# Patient Record
Sex: Female | Born: 1955 | Race: White | Hispanic: No | Marital: Married | Smoking: Never smoker
Health system: Southern US, Community
[De-identification: ages and names within clinical notes are randomized; demographics above are authoritative.]

## PROBLEM LIST (undated history)

## (undated) DIAGNOSIS — Z9889 Other specified postprocedural states: Secondary | ICD-10-CM

## (undated) DIAGNOSIS — N200 Calculus of kidney: Secondary | ICD-10-CM

## (undated) DIAGNOSIS — R112 Nausea with vomiting, unspecified: Secondary | ICD-10-CM

## (undated) DIAGNOSIS — I8390 Asymptomatic varicose veins of unspecified lower extremity: Secondary | ICD-10-CM

## (undated) DIAGNOSIS — R896 Abnormal cytological findings in specimens from other organs, systems and tissues: Secondary | ICD-10-CM

## (undated) DIAGNOSIS — N362 Urethral caruncle: Secondary | ICD-10-CM

## (undated) DIAGNOSIS — I872 Venous insufficiency (chronic) (peripheral): Secondary | ICD-10-CM

## (undated) DIAGNOSIS — R0982 Postnasal drip: Secondary | ICD-10-CM

## (undated) DIAGNOSIS — F418 Other specified anxiety disorders: Secondary | ICD-10-CM

## (undated) DIAGNOSIS — F988 Other specified behavioral and emotional disorders with onset usually occurring in childhood and adolescence: Secondary | ICD-10-CM

## (undated) DIAGNOSIS — M1990 Unspecified osteoarthritis, unspecified site: Secondary | ICD-10-CM

## (undated) DIAGNOSIS — Z8739 Personal history of other diseases of the musculoskeletal system and connective tissue: Secondary | ICD-10-CM

## (undated) DIAGNOSIS — K219 Gastro-esophageal reflux disease without esophagitis: Secondary | ICD-10-CM

## (undated) DIAGNOSIS — Z87442 Personal history of urinary calculi: Secondary | ICD-10-CM

## (undated) HISTORY — PX: NASAL SINUS SURGERY: SHX719

## (undated) HISTORY — DX: Calculus of kidney: N20.0

## (undated) HISTORY — DX: Venous insufficiency (chronic) (peripheral): I87.2

## (undated) HISTORY — DX: Asymptomatic varicose veins of unspecified lower extremity: I83.90

## (undated) HISTORY — DX: Abnormal cytological findings in specimens from other organs, systems and tissues: R89.6

## (undated) HISTORY — DX: Personal history of other diseases of the musculoskeletal system and connective tissue: Z87.39

## (undated) HISTORY — PX: TOTAL HIP ARTHROPLASTY: SHX124

## (undated) HISTORY — DX: Urethral caruncle: N36.2

## (undated) HISTORY — PX: TONSILLECTOMY: SUR1361

## (undated) HISTORY — DX: Other specified anxiety disorders: F41.8

---

## 1996-09-19 DIAGNOSIS — IMO0001 Reserved for inherently not codable concepts without codable children: Secondary | ICD-10-CM

## 1996-09-19 DIAGNOSIS — N362 Urethral caruncle: Secondary | ICD-10-CM

## 1996-09-19 HISTORY — DX: Urethral caruncle: N36.2

## 1996-09-19 HISTORY — DX: Reserved for inherently not codable concepts without codable children: IMO0001

## 1998-05-06 ENCOUNTER — Encounter: Admission: RE | Admit: 1998-05-06 | Discharge: 1998-08-04 | Payer: Self-pay | Admitting: Specialist

## 1998-07-27 ENCOUNTER — Encounter: Payer: Self-pay | Admitting: Orthopedic Surgery

## 1998-08-03 ENCOUNTER — Encounter: Payer: Self-pay | Admitting: Orthopedic Surgery

## 1998-08-03 ENCOUNTER — Inpatient Hospital Stay (HOSPITAL_COMMUNITY): Admission: RE | Admit: 1998-08-03 | Discharge: 1998-08-07 | Payer: Self-pay | Admitting: Orthopedic Surgery

## 1998-08-10 ENCOUNTER — Encounter (HOSPITAL_COMMUNITY): Admission: RE | Admit: 1998-08-10 | Discharge: 1998-11-08 | Payer: Self-pay | Admitting: Orthopedic Surgery

## 1999-10-07 ENCOUNTER — Other Ambulatory Visit: Admission: RE | Admit: 1999-10-07 | Discharge: 1999-10-07 | Payer: Self-pay | Admitting: Obstetrics & Gynecology

## 2000-03-20 ENCOUNTER — Encounter: Admission: RE | Admit: 2000-03-20 | Discharge: 2000-03-20 | Payer: Self-pay | Admitting: Internal Medicine

## 2000-03-20 ENCOUNTER — Encounter: Payer: Self-pay | Admitting: Internal Medicine

## 2000-11-13 ENCOUNTER — Other Ambulatory Visit: Admission: RE | Admit: 2000-11-13 | Discharge: 2000-11-13 | Payer: Self-pay | Admitting: Obstetrics & Gynecology

## 2001-09-13 ENCOUNTER — Encounter: Admission: RE | Admit: 2001-09-13 | Discharge: 2001-09-13 | Payer: Self-pay | Admitting: Urology

## 2001-09-13 ENCOUNTER — Encounter: Payer: Self-pay | Admitting: Urology

## 2001-11-29 ENCOUNTER — Other Ambulatory Visit: Admission: RE | Admit: 2001-11-29 | Discharge: 2001-11-29 | Payer: Self-pay | Admitting: Obstetrics & Gynecology

## 2002-12-11 ENCOUNTER — Other Ambulatory Visit: Admission: RE | Admit: 2002-12-11 | Discharge: 2002-12-11 | Payer: Self-pay | Admitting: Obstetrics & Gynecology

## 2003-08-20 HISTORY — PX: COLONOSCOPY: SHX5424

## 2003-12-16 ENCOUNTER — Other Ambulatory Visit: Admission: RE | Admit: 2003-12-16 | Discharge: 2003-12-16 | Payer: Self-pay | Admitting: Obstetrics & Gynecology

## 2004-12-13 ENCOUNTER — Other Ambulatory Visit: Admission: RE | Admit: 2004-12-13 | Discharge: 2004-12-13 | Payer: Self-pay | Admitting: Obstetrics and Gynecology

## 2004-12-27 ENCOUNTER — Encounter: Admission: RE | Admit: 2004-12-27 | Discharge: 2004-12-27 | Payer: Self-pay | Admitting: *Deleted

## 2005-11-21 ENCOUNTER — Other Ambulatory Visit: Admission: RE | Admit: 2005-11-21 | Discharge: 2005-11-21 | Payer: Self-pay | Admitting: Obstetrics and Gynecology

## 2006-12-19 ENCOUNTER — Other Ambulatory Visit: Admission: RE | Admit: 2006-12-19 | Discharge: 2006-12-19 | Payer: Self-pay | Admitting: Obstetrics and Gynecology

## 2006-12-19 ENCOUNTER — Encounter: Payer: Self-pay | Admitting: Family Medicine

## 2006-12-19 LAB — CONVERTED CEMR LAB
Cholesterol: 247 mg/dL
HDL: 46 mg/dL
Hgb A1c MFr Bld: 4.9 %
LDL Cholesterol: 174 mg/dL
Triglycerides: 133 mg/dL

## 2006-12-21 ENCOUNTER — Encounter: Admission: RE | Admit: 2006-12-21 | Discharge: 2006-12-21 | Payer: Self-pay | Admitting: Obstetrics & Gynecology

## 2006-12-21 LAB — HM MAMMOGRAPHY

## 2007-05-09 ENCOUNTER — Ambulatory Visit: Payer: Self-pay | Admitting: Family Medicine

## 2007-05-09 LAB — CONVERTED CEMR LAB
Bilirubin Urine: NEGATIVE
Blood Glucose, Fasting: 81 mg/dL
Glucose, Urine, Semiquant: NEGATIVE
Ketones, urine, test strip: NEGATIVE
Nitrite: NEGATIVE
Protein, U semiquant: NEGATIVE
Specific Gravity, Urine: 1.015
Urobilinogen, UA: 0.2
WBC Urine, dipstick: NEGATIVE
pH: 8.5

## 2007-05-10 LAB — CONVERTED CEMR LAB
ALT: 15 units/L (ref 0–35)
AST: 21 units/L (ref 0–37)
Albumin: 4.1 g/dL (ref 3.5–5.2)
Alkaline Phosphatase: 52 units/L (ref 39–117)
BUN: 15 mg/dL (ref 6–23)
CO2: 25 meq/L (ref 19–32)
Calcium: 8.9 mg/dL (ref 8.4–10.5)
Chloride: 104 meq/L (ref 96–112)
Creatinine, Ser: 0.83 mg/dL (ref 0.40–1.20)
Glucose, Bld: 82 mg/dL (ref 70–99)
Potassium: 4.3 meq/L (ref 3.5–5.3)
Sodium: 136 meq/L (ref 135–145)
Total Bilirubin: 0.7 mg/dL (ref 0.3–1.2)
Total Protein: 6.8 g/dL (ref 6.0–8.3)

## 2007-05-12 ENCOUNTER — Encounter: Payer: Self-pay | Admitting: Family Medicine

## 2007-05-23 ENCOUNTER — Ambulatory Visit: Payer: Self-pay | Admitting: Family Medicine

## 2007-05-23 LAB — CONVERTED CEMR LAB
Bilirubin Urine: NEGATIVE
Glucose, Urine, Semiquant: NEGATIVE
Ketones, urine, test strip: NEGATIVE
Nitrite: NEGATIVE
Protein, U semiquant: NEGATIVE
Specific Gravity, Urine: 1.02
Urobilinogen, UA: NEGATIVE
WBC Urine, dipstick: NEGATIVE
pH: 6.5

## 2007-06-01 ENCOUNTER — Encounter: Payer: Self-pay | Admitting: Family Medicine

## 2007-06-01 ENCOUNTER — Telehealth (INDEPENDENT_AMBULATORY_CARE_PROVIDER_SITE_OTHER): Payer: Self-pay | Admitting: *Deleted

## 2007-06-01 LAB — CONVERTED CEMR LAB
Creatinine, Urine: 156.4 mg/dL
RBC / HPF: NONE SEEN (ref <3)
Total Protein, Urine: 8
WBC, UA: NONE SEEN cells/hpf (ref <3)

## 2007-07-12 ENCOUNTER — Encounter: Payer: Self-pay | Admitting: Family Medicine

## 2007-08-07 ENCOUNTER — Encounter: Payer: Self-pay | Admitting: Family Medicine

## 2007-08-29 ENCOUNTER — Ambulatory Visit: Payer: Self-pay | Admitting: Family Medicine

## 2007-08-29 DIAGNOSIS — I872 Venous insufficiency (chronic) (peripheral): Secondary | ICD-10-CM | POA: Insufficient documentation

## 2007-09-11 ENCOUNTER — Encounter: Payer: Self-pay | Admitting: Family Medicine

## 2007-09-11 ENCOUNTER — Ambulatory Visit: Payer: Self-pay | Admitting: Family Medicine

## 2007-09-11 DIAGNOSIS — F988 Other specified behavioral and emotional disorders with onset usually occurring in childhood and adolescence: Secondary | ICD-10-CM | POA: Insufficient documentation

## 2007-09-17 ENCOUNTER — Telehealth (INDEPENDENT_AMBULATORY_CARE_PROVIDER_SITE_OTHER): Payer: Self-pay | Admitting: *Deleted

## 2007-09-17 ENCOUNTER — Encounter: Payer: Self-pay | Admitting: Family Medicine

## 2007-09-17 DIAGNOSIS — N841 Polyp of cervix uteri: Secondary | ICD-10-CM | POA: Insufficient documentation

## 2007-09-17 DIAGNOSIS — E785 Hyperlipidemia, unspecified: Secondary | ICD-10-CM | POA: Insufficient documentation

## 2007-10-12 ENCOUNTER — Ambulatory Visit: Payer: Self-pay | Admitting: Family Medicine

## 2007-11-12 ENCOUNTER — Telehealth: Payer: Self-pay | Admitting: Family Medicine

## 2007-12-13 ENCOUNTER — Ambulatory Visit: Payer: Self-pay | Admitting: Family Medicine

## 2007-12-13 DIAGNOSIS — N959 Unspecified menopausal and perimenopausal disorder: Secondary | ICD-10-CM | POA: Insufficient documentation

## 2007-12-20 ENCOUNTER — Other Ambulatory Visit: Admission: RE | Admit: 2007-12-20 | Discharge: 2007-12-20 | Payer: Self-pay | Admitting: Obstetrics and Gynecology

## 2008-01-01 ENCOUNTER — Ambulatory Visit: Payer: Self-pay | Admitting: Family Medicine

## 2008-01-01 ENCOUNTER — Encounter: Admission: RE | Admit: 2008-01-01 | Discharge: 2008-01-01 | Payer: Self-pay | Admitting: Obstetrics and Gynecology

## 2008-01-02 ENCOUNTER — Encounter: Payer: Self-pay | Admitting: Family Medicine

## 2008-01-14 ENCOUNTER — Telehealth: Payer: Self-pay | Admitting: Family Medicine

## 2008-01-24 ENCOUNTER — Telehealth: Payer: Self-pay | Admitting: Family Medicine

## 2008-02-15 ENCOUNTER — Telehealth: Payer: Self-pay | Admitting: Family Medicine

## 2008-03-18 ENCOUNTER — Ambulatory Visit: Payer: Self-pay | Admitting: Family Medicine

## 2008-04-17 ENCOUNTER — Telehealth: Payer: Self-pay | Admitting: Family Medicine

## 2008-05-21 ENCOUNTER — Telehealth: Payer: Self-pay | Admitting: Family Medicine

## 2008-06-10 ENCOUNTER — Ambulatory Visit: Payer: Self-pay | Admitting: Occupational Medicine

## 2008-06-23 ENCOUNTER — Telehealth: Payer: Self-pay | Admitting: Family Medicine

## 2008-06-25 ENCOUNTER — Telehealth: Payer: Self-pay | Admitting: Family Medicine

## 2008-06-26 ENCOUNTER — Ambulatory Visit: Payer: Self-pay | Admitting: Occupational Medicine

## 2008-08-28 ENCOUNTER — Ambulatory Visit: Payer: Self-pay | Admitting: Family Medicine

## 2008-09-29 ENCOUNTER — Telehealth: Payer: Self-pay | Admitting: Family Medicine

## 2008-11-03 ENCOUNTER — Telehealth: Payer: Self-pay | Admitting: Family Medicine

## 2008-11-21 ENCOUNTER — Telehealth: Payer: Self-pay | Admitting: Family Medicine

## 2008-12-18 ENCOUNTER — Encounter: Payer: Self-pay | Admitting: Family Medicine

## 2008-12-18 HISTORY — PX: COLONOSCOPY W/ BIOPSIES: SHX1374

## 2008-12-18 LAB — HM COLONOSCOPY: HM Colonoscopy: ABNORMAL

## 2008-12-23 ENCOUNTER — Other Ambulatory Visit: Admission: RE | Admit: 2008-12-23 | Discharge: 2008-12-23 | Payer: Self-pay | Admitting: Obstetrics and Gynecology

## 2009-01-02 ENCOUNTER — Telehealth: Payer: Self-pay | Admitting: Family Medicine

## 2009-01-05 ENCOUNTER — Encounter: Admission: RE | Admit: 2009-01-05 | Discharge: 2009-01-05 | Payer: Self-pay | Admitting: Obstetrics and Gynecology

## 2009-01-07 ENCOUNTER — Encounter: Payer: Self-pay | Admitting: Family Medicine

## 2009-01-24 ENCOUNTER — Ambulatory Visit: Payer: Self-pay | Admitting: Family Medicine

## 2009-01-31 ENCOUNTER — Ambulatory Visit: Payer: Self-pay | Admitting: Family Medicine

## 2009-02-09 ENCOUNTER — Telehealth: Payer: Self-pay | Admitting: Family Medicine

## 2009-03-12 ENCOUNTER — Telehealth: Payer: Self-pay | Admitting: Family Medicine

## 2009-04-10 ENCOUNTER — Telehealth: Payer: Self-pay | Admitting: Family Medicine

## 2009-05-13 ENCOUNTER — Telehealth: Payer: Self-pay | Admitting: Family Medicine

## 2009-06-11 ENCOUNTER — Telehealth: Payer: Self-pay | Admitting: Family Medicine

## 2009-07-01 ENCOUNTER — Ambulatory Visit: Payer: Self-pay | Admitting: Family Medicine

## 2009-07-10 ENCOUNTER — Encounter: Payer: Self-pay | Admitting: Family Medicine

## 2009-07-13 ENCOUNTER — Telehealth: Payer: Self-pay | Admitting: Family Medicine

## 2009-08-06 ENCOUNTER — Encounter: Payer: Self-pay | Admitting: Family Medicine

## 2009-08-12 ENCOUNTER — Encounter: Admission: RE | Admit: 2009-08-12 | Discharge: 2009-09-09 | Payer: Self-pay | Admitting: Orthopedic Surgery

## 2009-08-17 ENCOUNTER — Telehealth: Payer: Self-pay | Admitting: Family Medicine

## 2009-08-21 ENCOUNTER — Encounter: Payer: Self-pay | Admitting: Family Medicine

## 2009-08-25 ENCOUNTER — Encounter: Payer: Self-pay | Admitting: Family Medicine

## 2009-09-21 ENCOUNTER — Telehealth: Payer: Self-pay | Admitting: Family Medicine

## 2009-10-14 IMAGING — MG MM DIGITAL SCREENING
2 series · 2 of 2 positions shown · non-contrast
Comparison: Prior studies.

DG SCREEN MAMMOGRAM BILATERAL
Bilateral CC and MLO view(s) were taken.
Technologist: Don Lolito Onacram

DIGITAL SCREENING MAMMOGRAM WITH CAD:

[L CC (1 of 2)]
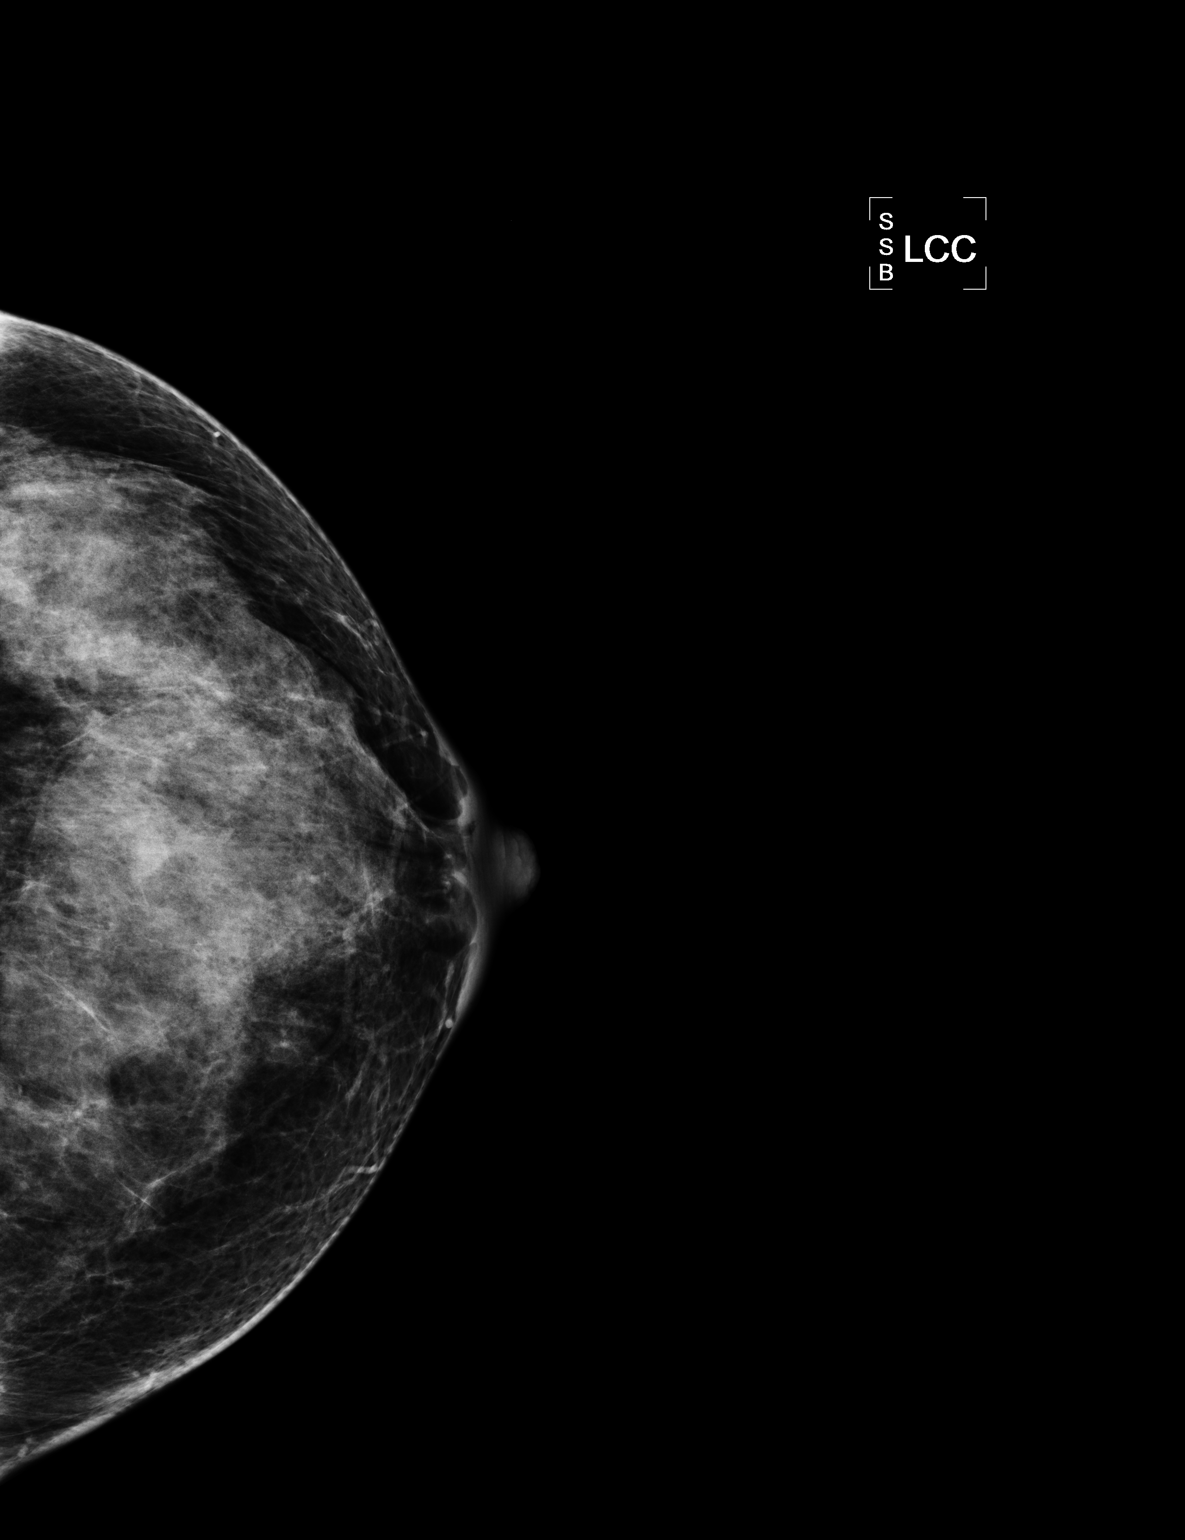

[L CC (2 of 2)]
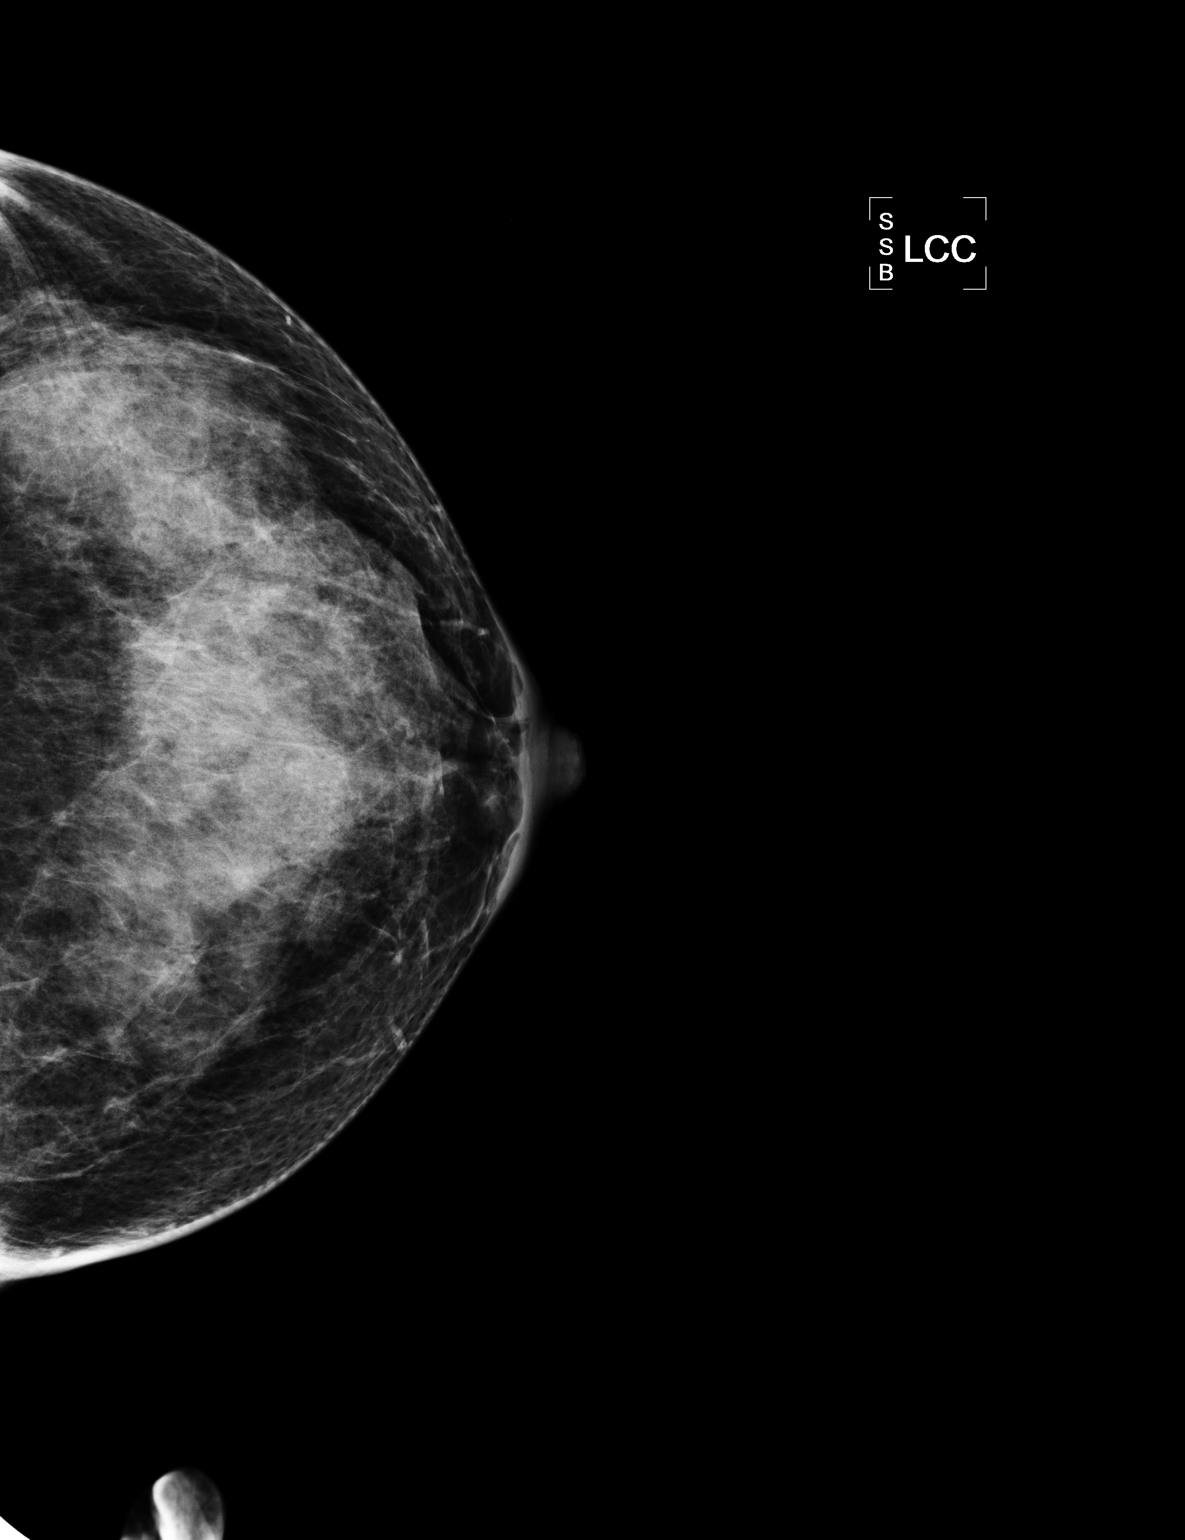

[2 of 2 positions shown; findings below may reference images not displayed]

The breast tissue is extremely dense.  There is no dominant mass, architectural distortion or 
calcification to suggest malignancy.
IMPRESSION: No mammographic evidence of malignancy.  Suggest yearly screening mammography.

A result letter of this screening mammogram will be mailed directly to the patient.

ASSESSMENT: Negative - BI-RADS 1

Screening mammogram in 1 year.
THIS WAS ANALAYZED BY COMPUTER AIDED DETECTION. , THIS PROCEDURE WAS A DIGITAL MAMMOGRAM.

## 2009-11-19 ENCOUNTER — Telehealth: Payer: Self-pay | Admitting: Family Medicine

## 2009-12-23 ENCOUNTER — Telehealth: Payer: Self-pay | Admitting: Family Medicine

## 2010-01-12 ENCOUNTER — Encounter: Admission: RE | Admit: 2010-01-12 | Discharge: 2010-01-12 | Payer: Self-pay | Admitting: Obstetrics and Gynecology

## 2010-02-01 ENCOUNTER — Telehealth: Payer: Self-pay | Admitting: Family Medicine

## 2010-04-08 ENCOUNTER — Telehealth: Payer: Self-pay | Admitting: Family Medicine

## 2010-04-13 ENCOUNTER — Ambulatory Visit: Payer: Self-pay | Admitting: Family Medicine

## 2010-04-13 DIAGNOSIS — G4700 Insomnia, unspecified: Secondary | ICD-10-CM | POA: Insufficient documentation

## 2010-05-11 ENCOUNTER — Telehealth: Payer: Self-pay | Admitting: Family Medicine

## 2010-06-16 ENCOUNTER — Telehealth: Payer: Self-pay | Admitting: Family Medicine

## 2010-07-26 ENCOUNTER — Telehealth: Payer: Self-pay | Admitting: Family Medicine

## 2010-08-19 ENCOUNTER — Encounter: Payer: Self-pay | Admitting: Family Medicine

## 2010-08-26 ENCOUNTER — Telehealth: Payer: Self-pay | Admitting: Family Medicine

## 2010-10-04 ENCOUNTER — Telehealth: Payer: Self-pay | Admitting: Family Medicine

## 2010-10-10 ENCOUNTER — Encounter: Payer: Self-pay | Admitting: Obstetrics and Gynecology

## 2010-10-21 NOTE — Progress Notes (Signed)
Summary: QUESTIONS ABOUT MED INTERACTIONS  Phone Note Call from Patient Call back at Home Phone [phone]   Caller: Patient Call For: nurse Summary of Call: PATIENT CALLED AND WAS WANTING TO KNOW IF IT WAS SAFE TO TAKE ZERGERID AND HER NEW BCP WITH HER ADDERALL. NURSE ADVISED PATIENT THAT IS WAS OKAY AND ALSO WANTED TO KNOW IF ADDERALL WOULD CAUSE FLUTTERS. NURSE ADVISED HER THAT IT CAN CAUSE SOME FLUTTERING. Initial call taken by: Harlene Salts,  September 17, 2007 9:17 AM    New/Updated Medications: AVIANE 0.1-20 MG-MCG  TABS (LEVONORGESTREL-ETHINYL ESTRAD) Take 1 tablet by mouth once a day

## 2010-10-21 NOTE — Progress Notes (Signed)
Summary: Adderall refill  Phone Note Refill Request Message from:  Patient on March 12, 2009 1:17 PM  Refills Requested: Medication #1:  ADDERALL XR 25 MG XR24H-CAP Take 1 tablet by mouth once a day  Method Requested: Pick up at Office Initial call taken by: Kathlene November,  March 12, 2009 1:17 PM      Prescriptions: ADDERALL XR 25 MG XR24H-CAP (AMPHETAMINE-DEXTROAMPHETAMINE) Take 1 tablet by mouth once a day  #30 x 0   Entered and Authorized by:   Nani Gasser MD   Signed by:   Nani Gasser MD on 03/12/2009   Method used:   Print then Give to Patient   RxID:   [id number]   Appended Document: Adderall refill Prescriptions: ADDERALL XR 25 MG XR24H-CAP (AMPHETAMINE-DEXTROAMPHETAMINE) Take 1 tablet by mouth once a day  #30 x 0   Entered and Authorized by:   Nani Gasser MD   Signed by:   Nani Gasser MD on 03/13/2009   Method used:   Print then Give to Patient   RxID:   [id number]

## 2010-10-21 NOTE — Assessment & Plan Note (Signed)
Summary: f/u ADD/ sore throat   Vital Signs:  Patient Profile:   55 Years Old Female Height:     61.5 inches Weight:      134 pounds BMI:     25.00 O2 Sat:      99 % Temp:     98.4 degrees F oral Pulse rate:   93 / minute BP sitting:   91 / 64  (left arm) Cuff size:   regular  Vitals Entered By: Harlene Salts (October 12, 2007 3:44 PM)                 Visit Type:  acute PCP:  Nani Gasser MD  Chief Complaint:  follow-up ADD and sorethroat.  History of Present Illness: 55 yo WF presents with sore throat x 1 day.  Denies any runny nose or cough.  Ibuprofen helping.  No GI upset, fevers or chills.  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  Started Adderall 20 mg XR last month.  She has had some trouble falling asleep if she takes it too late in the morning.  Avoiding caffeine.  Had some heart palpitations the first wk which improved.  HA's improved.  Has more energy in the morning and afternoon.  Has had a little decrease in appetite.      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         Never Smoked       Alcohol use-yes       Drug use-no       Regular exercise-yes       No kids    Review of Systems      See HPI   Physical Exam  General:     alert, well-developed, well-nourished, and well-hydrated.   Head:     normocephalic and atraumatic.   Eyes:     conjunctiva clear Ears:     EACs patent; TMs translucent and gray with good cone of light and bony landmarks.  Nose:     no external deformity and no nasal discharge.   Mouth:     good dentition and pharynx pink and moist.   o/p injected with tiny red vesicles Neck:     supple and no masses.   Lungs:     Normal respiratory effort, chest expands symmetrically. Lungs are clear to auscultation, no crackles or wheezes. Heart:     Normal rate and regular rhythm. S1 and S2 normal without gallop, murmur, click, rub or other extra sounds. Skin:     color normal and no rashes.   Cervical Nodes:     No lymphadenopathy noted    Impression & Recommendations:  Problem # 1:  ADD (ICD-314.00) Assessment: Improved Doing well w/ new start Adderall XR 20 mg/ day. She will be due for CMP at f/u appt in 4 mos.    Problem # 2:  SORE THROAT (ICD-462) Rapid strep neg, viral etiology.  Supportive care w/ ibuprofen, clear fluids and chlorasceptic spray. Orders:  Rapid Strep (16109)   Complete Medication List: 1)  Aviane 0.1-20 Mg-mcg Tabs (Levonorgestrel-ethinyl estrad) .... Take 1 tablet by mouth once a day 2)  Zegerid 40-1100 Mg Caps (Omeprazole-sodium bicarbonate) .... Take one tablet by mouth once a day 3)  Adderall Xr 20 Mg Cp24 (Amphetamine-dextroamphetamine) .Marland Kitchen.. 1 tab by mouth qam     Prescriptions: ADDERALL XR 20 MG  CP24 (AMPHETAMINE-DEXTROAMPHETAMINE) 1 tab by mouth qAM  #31 x 0   Entered and Authorized by:   Seymour Bars DO   Signed by:   Seymour Bars DO on 10/12/2007   Method used:   Print then Give to Patient   RxID:   [id number]  ]  Appended Document: f/u ADD/ sore throat     Vitals Entered By: Harlene Salts (October 12, 2007 4:22 PM)                 Chief Complaint:  STREP TEST RESULTS.  Current Allergies: No known allergies         Complete Medication List: 1)  Aviane 0.1-20 Mg-mcg Tabs (Levonorgestrel-ethinyl estrad) .... Take 1 tablet by mouth once a day 2)  Zegerid 40-1100 Mg Caps (Omeprazole-sodium bicarbonate) .... Take one tablet by mouth once a day 3)  Adderall Xr 20 Mg Cp24 (Amphetamine-dextroamphetamine) .Marland Kitchen.. 1 tab by mouth qam     ] Laboratory Results   Date/Time Received: October 12, 2007 4:22 PM  Date/Time Reported: October 12, 2007 4:22 PM   Other Tests  Rapid Strep: negative  Kit Test Internal QC: Negative   (Normal Range: Negative)

## 2010-10-21 NOTE — Progress Notes (Signed)
Summary: Adderral  Phone Note Call from Patient Call back at Home Phone [phone]   Caller: Patient Call For: Nani Gasser MD Summary of Call: Pt needs rx for her Adderrall. Will pickup this afternoon. Initial call taken by: Kathlene November,  Feb 15, 2008 9:02 AM      Prescriptions: ADDERALL XR 20 MG  CP24 (AMPHETAMINE-DEXTROAMPHETAMINE) 1 tab by mouth qAM  #31 x 0   Entered and Authorized by:   Nani Gasser MD   Signed by:   Nani Gasser MD on 02/15/2008   Method used:   Print then Give to Patient   RxID:   [id number]

## 2010-10-21 NOTE — Assessment & Plan Note (Signed)
Summary: STICHES REMOVAL  CHIEF COMPLAINT:  Removal of stitches from 01/24/09  VITAL SIGNS:    Height:    (previous: 61.5 )    Weight:   (previous: 127 )    Temp: 98.3    BP: 107/73    Pulse:   87    Resp: 16    02 Sat: 100%  ALLERGIES: CDN  Past History, Family History, Social History previously recorded  Subjective:  Patient presents for suture removal.  She has no complaints.  Objective: Well healed laceration over left eye.  Assessment:  ENCOUNTER FOR REMOVAL OF SUTURES (ICD-V58.32)  No evidence infection  Plan:   Sutures removed.    Current Allergies: ! CODEINE        Assessment New Problems: ENCOUNTER FOR REMOVAL OF SUTURES (ICD-V58.32)   The patient and/or caregiver has been counseled thoroughly with regard to medications prescribed including dosage, schedule, interactions, rationale for use, and possible side effects and they verbalize understanding.  Diagnoses and expected course of recovery discussed and will return if not improved as expected or if the condition worsens. Patient and/or caregiver verbalized understanding.     ] ]

## 2010-10-21 NOTE — Progress Notes (Signed)
  Phone Note Refill Request Message from:  Patient on December 23, 2009 10:10 AM  Refills Requested: Medication #1:  ADDERALL XR 25 MG XR24H-CAP Take 1 tablet by mouth once a day   Supply Requested: 1 month  Method Requested: Pick up at Office Initial call taken by: Kathlene November,  December 23, 2009 10:11 AM    Prescriptions: ADDERALL XR 25 MG XR24H-CAP (AMPHETAMINE-DEXTROAMPHETAMINE) Take 1 tablet by mouth once a day  #30 x 0   Entered and Authorized by:   Nani Gasser MD   Signed by:   Nani Gasser MD on 12/23/2009   Method used:   Print then Give to Patient   RxID:   [id number]

## 2010-10-21 NOTE — Miscellaneous (Signed)
Summary: records from [facility]  Clinical Lists Changes  Problems: Added new problem of CERVICAL POLYP (ICD-622.7) Removed problem of EAR PAIN, RIGHT (ICD-388.70) Removed problem of URI (ICD-465.9) Removed problem of DIZZINESS (ICD-780.4) Added new problem of HYPERLIPIDEMIA (ICD-272.4) Observations: Added new observation of MAMMO DUE: 12/2007 (09/17/2007 9:56) Added new observation of PAST MED HX: varicose veins chronic venous insufficiency [facility] Vit D 64 (6-08) (09/17/2007 9:56) Added new observation of PRIMARY MD: Nani Gasser MD (09/17/2007 9:56) Added new observation of MAMMRECACT: Screening mammogram in 1 year.    (12/21/2006 9:57) Added new observation of MAMMOGRAM: No significant changes compared to previous study.  Assessment: BIRADS 1. Location: [facility].    (12/21/2006 9:57) Added new observation of HGBA1C: 4.9 % (12/19/2006 9:56) Added new observation of LDL: 174 mg/dL (16/06/9603 5:40) Added new observation of HDL: 46 mg/dL (98/07/9146 8:29) Added new observation of TRIGLYC TOT: 133 mg/dL (56/21/3086 5:78) Added new observation of CHOLESTEROL: 247 mg/dL (46/96/2952 8:41)        Past Medical History:    varicose veins    chronic venous insufficiency    [facility]    Vit D 64 (6-08)    Mammogram  Procedure date:  12/21/2006  Findings:      No significant changes compared to previous study.  Assessment: BIRADS 1. Location: [facility].     Comments:      Screening mammogram in 1 year.     Procedures Next Due Date:    Mammogram: 12/2007   Mammogram  Procedure date:  12/21/2006  Findings:      No significant changes compared to previous study.  Assessment: BIRADS 1. Location: [facility].     Comments:      Screening mammogram in 1 year.     Procedures Next Due Date:    Mammogram:  12/2007

## 2010-10-21 NOTE — Medication Information (Signed)
Summary: Med Auth/Effexor  Med Auth/Effexor   Imported By: Kathlene November 01/31/2008 08:21:19  _____________________________________________________________________  External Attachment:    Type:   Image     Comment:   External Document

## 2010-10-21 NOTE — Letter (Signed)
Summary: [facility]  [facility]   Imported By: Kathlene November 09/06/2007 09:14:37  _____________________________________________________________________  External Attachment:    Type:   Image     Comment:   External Document

## 2010-10-21 NOTE — Assessment & Plan Note (Signed)
Summary: STILL SICK/BS   Vital Signs:  Patient Profile:   55 Years Old Female CC:      still sick, having sore throat, sinus drainage, hoarse voice chest congestion with productive coughing, severe earache since Monday (Oct.5) Height:     61.5 inches Weight:      128 pounds BMI:     23.88 O2 Sat:      99 % Temp:     96.7 degrees F axillary Pulse rate:   107 / minute Pulse rhythm:   regular Resp:     20 per minute BP sitting:   96 / 68  (left arm) Cuff size:   regular  Vitals Entered By: Esaw Grandchild (June 26, 2008 5:06 PM)                  Current Allergies: ! CODEINE History of Present Illness Chief Complaint: still sick, having sore throat, sinus drainage, hoarse voice chest congestion with productive coughing, severe earache since Monday (Oct.5) History of Present Illness: 55 yo female with 4 day history of runny nose, cough, chills, achy, and headache.  Pt was seen on 22 Sep 09 for an URI that almost completely resolved after 2-3 days.  Pt denies fevers.  Denies chest pain or shortness of breath.  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    Physical Exam General appearance: well developed, well nourished, no acute distress Head: normocephalic, atraumatic Eyes: conjunctivae and lids normal Pupils: equal, round, reactive to light Ears: normal, no lesions or deformities Nasal: pale, boggy, swollen nasal turbinates Oral/Pharynx: pharyngeal erythema without exudate, uvula midline without deviation Neck: neck supple,  trachea midline, no masses Chest/Lungs: no rales, wheezes, or rhonchi bilateral, breath sounds equal without effort Heart: regular rate and  rhythm, no murmur Abdomen: soft, non-tender without obvious organomegaly Skin: no obvious rashes or lesions MSE: oriented to time, place, and person    Assessment New Problems: UPPER RESPIRATORY INFECTION, ACUTE (ICD-465.9)  uri   Plan New Orders: Est. Patient Level III [18841] Planning Comments:   1.  Drink plenty of fluids and get lots of rest.  Use tylenol 650 mg every 6 hours (4000mg  max per day) or ibuprofen 400-600 mg every 8 hours(with food) for fevers, chills and aches. 2.  Over the counter cough and cold preparations such as (Mucinex or Mucinex-DM  OR delsym) may be used to treat your cold symptoms.  Afrin nasal spray (2 sprays each nostril 2 times a day for three days) may be used to relieve sinus congestion. 3.  If you are not getting better in 7-10 days please follow up here or with your primary care provider.  If you are getting worse and need to be seen sooner, please, return to the clinic.    The patient and/or caregiver has been counseled thoroughly with regard to medications prescribed including dosage, schedule, interactions, rationale for use, and possible side effects and they verbalize understanding.  Diagnoses and expected course of recovery discussed and will return if not improved as expected or if the condition worsens. Patient and/or caregiver verbalized understanding.      Patient Instructions: 1)  1.  Drink plenty of fluids and get lots of rest.  Use tylenol 650 mg every 6 hours (4000mg  max per day) or ibuprofen 400-600 mg every 8 hours(with food) for fevers, chills and aches. 2)  2.  Over the counter cough and cold preparations such as (  Mucinex or Mucinex-DM  OR delsym) may be used to treat your cold symptoms.  Afrin nasal spray (2 sprays each nostril 2 times a day for three days) may be used to relieve sinus congestion. 3)  3.  If you are not getting better in 7-10 days please follow up here or with your primary care provider.  If you are getting worse and need to be seen sooner, please, return to the clinic. ]  Appended Document: STILL SICK/BS AGREE

## 2010-10-21 NOTE — Progress Notes (Signed)
Summary: adderall  Phone Note Refill Request Message from:  Patient on May 11, 2010 11:39 AM  Refills Requested: Medication #1:  ADDERALL XR 25 MG XR24H-CAP Take 1 tablet by mouth once a day   Dosage confirmed as above?Dosage Confirmed   Supply Requested: 1 month  Method Requested: Pick up at Office Initial call taken by: Avon Gully CMA, Duncan Dull),  May 11, 2010 11:39 AM Caller: Patient Call For: Nani Gasser MD Summary of Call: pt called and wants refill of Adderall Initial call taken by: Avon Gully CMA, Duncan Dull),  May 11, 2010 11:38 AM    Prescriptions: ADDERALL XR 25 MG XR24H-CAP (AMPHETAMINE-DEXTROAMPHETAMINE) Take 1 tablet by mouth once a day  #30 x 0   Entered and Authorized by:   Nani Gasser MD   Signed by:   Nani Gasser MD on 05/11/2010   Method used:   Print then Give to Patient   RxID:   [id number]

## 2010-10-21 NOTE — Progress Notes (Signed)
Summary: Aderrall refill  Phone Note Refill Request Message from:  Patient on Feb 09, 2009 10:08 AM  Refills Requested: Medication #1:  ADDERALL XR 25 MG XR24H-CAP Take 1 tablet by mouth once a day  Method Requested: Pick up at Office Initial call taken by: Kathlene November,  Feb 09, 2009 10:09 AM      Prescriptions: ADDERALL XR 25 MG XR24H-CAP (AMPHETAMINE-DEXTROAMPHETAMINE) Take 1 tablet by mouth once a day  #30 x 0   Entered and Authorized by:   Nani Gasser MD   Signed by:   Nani Gasser MD on 02/09/2009   Method used:   Print then Give to Patient   RxID:   [id number]

## 2010-10-21 NOTE — Progress Notes (Signed)
  Phone Note Call from Patient Call back at Home Phone [phone]   Caller: Patient Call For: Nani Gasser MD Summary of Call: Pt calls and states is coughing really bad and not feeling to well on Adderall and wants to know what cough med she can take Initial call taken by: Kathlene November,  June 25, 2008 4:46 PM  Follow-up for Phone Call        Can use DElsym. Especially if using it short term.  Follow-up by: Nani Gasser MD,  June 25, 2008 4:51 PM  Additional Follow-up for Phone Call Additional follow up Details #1::        Pt notified of MD instructions. Additional Follow-up by: Kathlene November,  June 25, 2008 4:55 PM

## 2010-10-21 NOTE — Progress Notes (Signed)
Summary: refill request  Phone Note Refill Request Message from:  Patient on July 26, 2010 9:37 AM  Refills Requested: Medication #1:  ADDERALL XR 25 MG XR24H-CAP Take 1 tablet by mouth once a day   Supply Requested: 1 month  Method Requested: Pick up at Office Initial call taken by: Kathlene November LPN,  July 26, 2010 9:37 AM    Prescriptions: ADDERALL XR 25 MG XR24H-CAP (AMPHETAMINE-DEXTROAMPHETAMINE) Take 1 tablet by mouth once a day  #30 x 0   Entered and Authorized by:   Nani Gasser MD   Signed by:   Nani Gasser MD on 07/26/2010   Method used:   Print then Give to Patient   RxID:   [id number]

## 2010-10-21 NOTE — Letter (Signed)
Summary: [facility]  [facility]   Imported By: Lanelle Bal 09/07/2009 10:14:21  _____________________________________________________________________  External Attachment:    Type:   Image     Comment:   External Document

## 2010-10-21 NOTE — Progress Notes (Signed)
Summary: Adderal Refill  Phone Note Call from Patient Call back at Home Phone [phone]   Caller: Patient Reason for Call: Refill Medication Summary of Call: Wants a refill for Adderal. She will come by the office this afternoon to pick up the prescription.  Initial call taken by: Jolyne Loa RN,  April 17, 2008 9:04 AM      Prescriptions: ADDERALL XR 20 MG  CP24 (AMPHETAMINE-DEXTROAMPHETAMINE) 1 tab by mouth qAM  #31 x 0   Entered and Authorized by:   Nani Gasser MD   Signed by:   Nani Gasser MD on 04/17/2008   Method used:   Print then Give to Patient   RxID:   [id number]

## 2010-10-21 NOTE — Assessment & Plan Note (Signed)
Summary: ADD   Vital Signs:  Patient Profile:   55 Years Old Female Height:     61.5 inches Weight:      142 pounds O2 Sat:      100 % Temp:     97.9 degrees F oral Pulse rate:   87 / minute BP sitting:   93 / 58  (left arm) Cuff size:   regular  Vitals Entered By: Harlene Salts (September 11, 2007 1:22 PM)                 Visit Type:  f/u PCP:  Nani Gasser MD  Chief Complaint:  ADD testing and sorethroat.  History of Present Illness: 55 yo WF presents for ADD testing.  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  She has no underlying pscyh diagnoses or substance abuse issues.  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   She struggled in school as a child from poor concentration and also struggled with math.  She has a hard time staying organized, is always losing things and is getting very frustrated about this.  She is in a stable home environment, married w/ no kids.  Several of her family members have ADD.    She denies trouble with tremor, anorexia, HTN, heart disease or insomnia.  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         Never Smoked       Alcohol use-yes       Drug use-no       Regular exercise-yes       No kids    Review of Systems      See HPI   Physical Exam  General:     alert, well-developed, well-nourished, and well-hydrated.   Neck:     no masses.   Lungs:     Normal respiratory effort, chest expands symmetrically. Lungs are clear to auscultation, no crackles or wheezes. Heart:     Normal rate and regular rhythm. S1 and S2 normal without gallop, murmur,  click, rub or other extra sounds. Psych:     good eye contact, not anxious appearing, and not depressed appearing.      Impression & Recommendations:  Problem # 1:  ADD (ICD-314.00) Assessment: New Adult ADD questionaire done.  Pt answered +8 ADD and No hyperactiviity symptoms.  Unlikley to have concurrent depression, anxiety, OCD or substance abuse problems.  EKG done and normal.  Will get her last set of labs from gyn.  Discussed pros and cons of treatment with possible SE's.  Pt agrees to starting ADDErall XR 20 mg qAM.  Return in 1 month to see how effective it is and recheck HR/BP. labs if needed.  Complete Medication List: 1)  Nortrel 1/35 (28) 1-35 Mg-mcg Tabs (Norethindrone-eth estradiol) .... Take one tablet by mouth once aday 2)  Zegerid 40-1100 Mg Caps (Omeprazole-sodium bicarbonate) .... Take one tablet by mouth once a day 3)  Adderall Xr 20 Mg Cp24 (Amphetamine-dextroamphetamine) .Marland Kitchen.. 1 tab by mouth qam     Prescriptions: ADDERALL  XR 20 MG  CP24 (AMPHETAMINE-DEXTROAMPHETAMINE) 1 tab by mouth qAM  #31 x 0   Entered and Authorized by:   Seymour Bars DO   Signed by:   Seymour Bars DO on 09/11/2007   Method used:   Print then Give to Patient   RxID:   [id number]  ]

## 2010-10-21 NOTE — Progress Notes (Signed)
Summary: SIDE EFFECTS FROM [facility]  Phone Note Call from Patient Call back at [phone]   Caller: Patient Call For: Nani Gasser MD Summary of Call: PATIENT CALLED TO SAY THAT EFFEXOR IS CAUSING HER TO HAVE SOME NAUSEA. PATIENT WOULD LIKE TO BE CALLED BACK ON THE ABOVE NUMBER BEFORE 9AM OR @11AM . Initial call taken by: Harlene Salts,  Jan 24, 2008 8:06 AM  Follow-up for Phone Call        Can stop the medication. Would she like to try something different.? Even though caused nausea, did it help her sxs? Follow-up by: Nani Gasser MD,  Jan 24, 2008 10:06 AM  Additional Follow-up for Phone Call Additional follow up Details #1::        Patient stopped it on Saturday and would like to try something different. [facility]ses [facility] [address] Additional Follow-up by: Harlene Salts,  Jan 24, 2008 11:55 AM    Additional Follow-up for Phone Call Additional follow up Details #2::    Med sent.  Follow-up by: Nani Gasser MD,  Jan 24, 2008 12:13 PM  New/Updated Medications: FLUOXETINE HCL 10 MG  TABS (FLUOXETINE HCL) Take 1 tablet by mouth once a day   Prescriptions: FLUOXETINE HCL 10 MG  TABS (FLUOXETINE HCL) Take 1 tablet by mouth once a day  #30 x 1   Entered and Authorized by:   Nani Gasser MD   Signed by:   Nani Gasser MD on 01/24/2008   Method used:   Electronically sent to ...       [facility]  [address] [id number]       [address] [place], [place]         Ph: [phone] or [phone]       Fax: [phone]   RxID:   [id number]

## 2010-10-21 NOTE — Medication Information (Signed)
Summary: Adderall Approved  Adderall Approved   Imported By: Maryln Gottron 08/27/2010 15:19:20  _____________________________________________________________________  External Attachment:    Type:   Image     Comment:   External Document

## 2010-10-21 NOTE — Progress Notes (Signed)
Summary: Aderrall refill  Phone Note Refill Request Message from:  Patient on Feb 01, 2010 10:20 AM  Refills Requested: Medication #1:  ADDERALL XR 25 MG XR24H-CAP Take 1 tablet by mouth once a day   Supply Requested: 1 month Will pick up tomorrow   Method Requested: Pick up at Office Initial call taken by: Kathlene November,  Feb 01, 2010 10:20 AM    Prescriptions: ADDERALL XR 25 MG XR24H-CAP (AMPHETAMINE-DEXTROAMPHETAMINE) Take 1 tablet by mouth once a day  #30 x 0   Entered and Authorized by:   Seymour Bars DO   Signed by:   Seymour Bars DO on 02/01/2010   Method used:   Print then Give to Patient   RxID:   [id number]

## 2010-10-21 NOTE — Assessment & Plan Note (Signed)
Summary: ADD, insomnia   Vital Signs:  Patient profile:   55 year old female Height:      61.5 inches Weight:      129 pounds BMI:     24.07 Pulse rate:   78 / minute BP sitting:   99 / 61  (left arm) Cuff size:   regular  Vitals Entered By: Kathlene November (April 13, 2010 10:13 AM) CC: follow-up ADD needs refill on Adderall   Primary Care Provider:  Nani Gasser MD  CC:  follow-up ADD needs refill on Adderall.  History of Present Illness: follow-up ADD needs refill on Adderall. At last visit we inc her adderall XR to 25mg  as it was wearing off too soon. She is now getting some occ insomnia but feels it is more from peri-menopause as she is also having nightsweats.  Not sure what to take but doesn't relly want a rx medication.  Only gets about 4-6 hours a night. Has mostly frequent awakenings.  Used to sleep well. She denies any weight loss. Some appetitie suppression. No CP or palps.   Current Medications (verified): 1)  Yaz 3-0.02 Mg  Tabs (Drospirenone-Ethinyl Estradiol) .... Take 1 Tablet By Mouth Once A Day 2)  Adderall Xr 25 Mg Xr24h-Cap (Amphetamine-Dextroamphetamine) .... Take 1 Tablet By Mouth Once A Day 3)  Fluoxetine Hcl 10 Mg  Tabs (Fluoxetine Hcl) .... Take 1 Tablet By Mouth Once A Day 4)  Fish Oil 1000 Mg Caps (Omega-3 Fatty Acids) .... Take One Tablet By Mouth Once A Day 5)  Multivitamins  Tabs (Multiple Vitamin) .... Take One Tablet By Mouth Once A Day 6)  Meloxicam 7.5 Mg Tabs (Meloxicam) .... Take One Tablet By Mouth Once A Day  Allergies (verified): 1)  ! Codeine  Comments:  Nurse/Medical Assistant: The patient's medications and allergies were reviewed with the patient and were updated in the Medication and Allergy Lists. Kathlene November (April 13, 2010 10:15 AM)  Physical Exam  General:  Well-developed,well-nourished,in no acute distress; alert,appropriate and cooperative throughout examination Lungs:  Normal respiratory effort, chest expands symmetrically.  Lungs are clear to auscultation, no crackles or wheezes. Heart:  Normal rate and regular rhythm. S1 and S2 normal without gallop, murmur, click, rub or other extra sounds. Psych:  Cognition and judgment appear intact. Alert and cooperative with normal attention span and concentration. No apparent delusions, illusions, hallucinations   Impression & Recommendations:  Problem # 1:  ADD (ICD-314.00) Ok for reifll . Dong well on the current dose. Has maintained her weight.  F/U in 4 months  to make sure still doing well.    Problem # 2:  INSOMNIA (ICD-780.52) Can try valerian root  or black cohash first as she is most interested in herbal traetment. Also reviewed sleep hygiene, though I do feel this is more hormonal related.  Also discussed possible use of progesterone tabs or rx medication.   Complete Medication List: 1)  Yaz 3-0.02 Mg Tabs (Drospirenone-ethinyl estradiol) .... Take 1 tablet by mouth once a day 2)  Adderall Xr 25 Mg Xr24h-cap (Amphetamine-dextroamphetamine) .... Take 1 tablet by mouth once a day 3)  Fluoxetine Hcl 10 Mg Tabs (Fluoxetine hcl) .... Take 1 tablet by mouth once a day 4)  Fish Oil 1000 Mg Caps (Omega-3 fatty acids) .... Take one tablet by mouth once a day 5)  Multivitamins Tabs (Multiple vitamin) .... Take one tablet by mouth once a day 6)  Meloxicam 7.5 Mg Tabs (Meloxicam) .... Take one tablet by mouth  once a day  Patient Instructions: 1)  Can try valerian root for sleep  or black cohash for hotflashes. 2)  If not helping then please let me know.   Prescriptions: ADDERALL XR 25 MG XR24H-CAP (AMPHETAMINE-DEXTROAMPHETAMINE) Take 1 tablet by mouth once a day  #30 x 0   Entered and Authorized by:   Nani Gasser MD   Signed by:   Nani Gasser MD on 04/13/2010   Method used:   Print then Give to Patient   RxID:   [id number]

## 2010-10-21 NOTE — Progress Notes (Signed)
  Phone Note Refill Request Message from:  Patient on July 13, 2009 11:29 AM  Refills Requested: Medication #1:  ADDERALL XR 25 MG XR24H-CAP Take 1 tablet by mouth once a day   Supply Requested: 1 month  Method Requested: Pick up at Office Initial call taken by: Kathlene November,  July 13, 2009 11:29 AM    Prescriptions: ADDERALL XR 25 MG XR24H-CAP (AMPHETAMINE-DEXTROAMPHETAMINE) Take 1 tablet by mouth once a day  #30 x 0   Entered and Authorized by:   Nani Gasser MD   Signed by:   Nani Gasser MD on 07/13/2009   Method used:   Print then Give to Patient   RxID:   [id number]

## 2010-10-21 NOTE — Progress Notes (Signed)
Summary: refill  Phone Note Refill Request Message from:  Patient on November 19, 2009 2:27 PM  Refills Requested: Medication #1:  ADDERALL XR 25 MG XR24H-CAP Take 1 tablet by mouth once a day   Supply Requested: 1 month  Method Requested: Pick up at Office Initial call taken by: Kathlene November,  November 19, 2009 2:27 PM    Prescriptions: ADDERALL XR 25 MG XR24H-CAP (AMPHETAMINE-DEXTROAMPHETAMINE) Take 1 tablet by mouth once a day  #30 x 0   Entered and Authorized by:   Nani Gasser MD   Signed by:   Nani Gasser MD on 11/19/2009   Method used:   Print then Give to Patient   RxID:   [id number]

## 2010-10-21 NOTE — Miscellaneous (Signed)
Summary: Colonoscopy  Clinical Lists Changes  Observations: Added new observation of COLONNXTDUE: 12/2013 (01/07/2009 12:01) Added new observation of COLONOSCOPY: abnormal (12/18/2008 12:02)       Preventive Care Screening  Colonoscopy:    Date:  12/18/2008    Next Due:  12/2013    Results:  abnormal  Perfomred at [facility]. Dr. Kinnie Scales.

## 2010-10-21 NOTE — Progress Notes (Signed)
Summary: URINE RESULTS  ---- Converted from flag ---- ---- 06/01/2007 11:20 AM, Seymour Bars DO wrote: Pls let pt know that her urine test show normal kidney function and no acute infection ------------------------------  PATIENT INFORMED.LM Phone Note Outgoing Call Call back at [facility] Phone [phone]   Call placed by: Harlene Salts,  June 01, 2007 2:19 PM Call placed to: Patient Reason for Call: Discuss lab or test results

## 2010-10-21 NOTE — Assessment & Plan Note (Signed)
Summary: LACERATION OVER LEFT EYE   Vital Signs:  Patient Profile:   55 Years Old Female CC:      fall/lac above left eye Height:     61.5 inches Weight:      127 pounds O2 Sat:      99 % O2 treatment:    Room Air Temp:     97.2 degrees F oral Pulse rate:   78 / minute Pulse rhythm:   regular Resp:     18 per minute BP sitting:   91 / 60  (left arm)  Pt. in pain?   no                   Prior Medication List:  YAZ 3-0.02 MG  TABS (DROSPIRENONE-ETHINYL ESTRADIOL) Take 1 tablet by mouth once a day ZEGERID 40-1100 MG  CAPS (OMEPRAZOLE-SODIUM BICARBONATE) Take one tablet by mouth once a day ADDERALL XR 25 MG XR24H-CAP (AMPHETAMINE-DEXTROAMPHETAMINE) Take 1 tablet by mouth once a day FLUOXETINE HCL 10 MG  TABS (FLUOXETINE HCL) Take 1 tablet by mouth once a day [facility] IBUPROFEN 200 MG TABS (IBUPROFEN)  MUCINEX 600 MG XR12H-TAB (GUAIFENESIN)    Current Allergies (reviewed today): ! CODEINE History of Present Illness Chief Complaint: fall/lac above left eye History of Present Illness: FELL AND SUFFERED A 3.5 CM LACERATION ABOVE LEFT BROW. NO LOC. NO DIZZINESS, NO N/V.BLEEDING CONTROLLED. ALSO MINOR ABRASION TO LEFT KNEE.   REVIEW OF SYSTEMS Constitutional Symptoms      Denies fever, chills, night sweats, weight loss, weight gain, and fatigue.  Eyes       Denies change in vision, eye pain, eye discharge, glasses, contact lenses, and eye surgery. Ear/Nose/Throat/Mouth       Denies hearing loss/aids, change in hearing, ear pain, ear discharge, dizziness, frequent runny nose, frequent nose bleeds, sinus problems, sore throat, hoarseness, and tooth pain or bleeding.  Respiratory       Denies dry cough, productive cough, wheezing, shortness of breath, asthma, bronchitis, and emphysema/COPD.  Cardiovascular       Denies murmurs, chest pain, and tires easily with exhertion.    Gastrointestinal       Denies stomach pain, nausea/vomiting, diarrhea, constipation, blood in bowel  movements, and indigestion. Genitourniary       Denies painful urination, kidney stones, and loss of urinary control. Neurological       Denies paralysis, seizures, and fainting/blackouts. Musculoskeletal       Denies muscle pain, joint pain, joint stiffness, decreased range of motion, redness, swelling, muscle weakness, and gout.  Skin       Denies bruising, unusual mles/lumps or sores, and hair/skin or nail changes.      Comments: laceration above left eye Psych       Denies mood changes, temper/anger issues, anxiety/stress, speech problems, depression, and sleep problems.  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 Married to [employer].         Never Smoked       Alcohol use-yes       Drug use-no       Regular exercise-yes       No kids     Assessment New Problems: LACERATION, FOREHEAD (ICD-873.42)     LOCAL WITH 1% XYLOCAIN. BETADINE PREP. EXPLORED FOR FOREIGN MATERIAL. NONE FOUND. IRRIGATED WITH STERIL SALINE. STERIL DRAPE. CLOSED WITH 6-0 PROLINE. GOOD CLOSURE. TOL WELL, GOOD HEMOSTASIS. CLEANSED WITH STERIL SALINE. ANTIBIOTIC OINTMENT AND DRESSING APPLIED.    Patient Instructions: 1)  KEEP CLEAN AND DRY. APPLY ANTIBIOTIC OINTMENT. FOLLOW UP IN 7-10 DAYS FOR SUTURE REMOVAL. SOONER IF ANY COMPLICATIONS. MAY APPLY ICE.  TYLENOL OR MOTRIN AS NEEDED. REPORT TO THE LOCAL ER IF ANY INCREASED DIZZINESS, VOMITING, ALTERED CONSCIOUSNESS, OR LOSS OF BALANCE.  ] ]    Immunizations Administered:  Tetanus Vaccine:    Vaccine Type: Tdap    Site: right deltoid    Mfr: GlaxoSmithKline    Dose: 0.5 ml    Route: IM    Given by: Lannie Fields    Exp. Date: 11/12/2010    Lot #: [id number]    Physician counseled: yes

## 2010-10-21 NOTE — Progress Notes (Signed)
Summary: ADDERAL RF  Phone Note Call from Patient Call back at [phone]   Reason for Call: Refill Medication Summary of Call: PT LEFT MSG STATING THAT SHE NEEDS HER ADDERAL REFILLED.  PLEASE CALL PT TO LET HER KNOW SHE HAS TO COME IN AND PICK UP HER RX. Initial call taken by: Sarina Ill,  November 12, 2007 7:55 AM  Follow-up for Phone Call        OK to pick up today.  Follow-up by: Nani Gasser MD,  November 12, 2007 8:06 AM  Additional Follow-up for Phone Call Additional follow up Details #1::        Pt notified can pick up Rx today. KJ LPN Additional Follow-up by: Kathlene November,  November 12, 2007 8:24 AM      Prescriptions: ADDERALL XR 20 MG  CP24 (AMPHETAMINE-DEXTROAMPHETAMINE) 1 tab by mouth qAM  #31 x 0   Entered and Authorized by:   Nani Gasser MD   Signed by:   Nani Gasser MD on 11/12/2007   Method used:   Print then Give to Patient   RxID:   [id number]

## 2010-10-21 NOTE — Progress Notes (Signed)
Summary: Adderall  Phone Note Call from Patient Call back at [phone]   Caller: Patient Call For: Nani Gasser MD Summary of Call: Pt nneeds her Adderall Rx written. Call when ready.  Initial call taken by: Kathlene November,  June 23, 2008 1:29 PM      Prescriptions: ADDERALL XR 20 MG  CP24 (AMPHETAMINE-DEXTROAMPHETAMINE) 1 tab by mouth qAM  #31 x 0   Entered and Authorized by:   Nani Gasser MD   Signed by:   Nani Gasser MD on 06/23/2008   Method used:   Print then Give to Patient   RxID:   [id number]

## 2010-10-21 NOTE — Consult Note (Signed)
Summary: [facility]  [facility]   Imported By: Kathlene November 08/21/2007 16:39:53  _____________________________________________________________________  External Attachment:    Type:   Image     Comment:   External Document

## 2010-10-21 NOTE — Letter (Signed)
Summary: [facility]  [facility]   Imported By: Lanelle Bal 09/02/2009 08:20:20  _____________________________________________________________________  External Attachment:    Type:   Image     Comment:   External Document

## 2010-10-21 NOTE — Progress Notes (Signed)
Summary: Adderall refill  Phone Note Refill Request Message from:  Patient on April 10, 2009 10:30 AM  Refills Requested: Medication #1:  ADDERALL XR 25 MG XR24H-CAP Take 1 tablet by mouth once a day  Method Requested: Pick up at Office Initial call taken by: Kathlene November,  April 10, 2009 10:30 AM    Prescriptions: ADDERALL XR 25 MG XR24H-CAP (AMPHETAMINE-DEXTROAMPHETAMINE) Take 1 tablet by mouth once a day  #30 x 0   Entered and Authorized by:   Nani Gasser MD   Signed by:   Nani Gasser MD on 04/10/2009   Method used:   Print then Give to Patient   RxID:   [id number]

## 2010-10-21 NOTE — Progress Notes (Signed)
Summary: adderall refill  Phone Note Refill Request Call back at Home Phone [phone] Message from:  Patient on May 13, 2009 11:42 AM  Refills Requested: Medication #1:  ADDERALL XR 25 MG XR24H-CAP Take 1 tablet by mouth once a day   Supply Requested: 1 month  Method Requested: Pick up at Office Initial call taken by: Kathlene November,  May 13, 2009 11:43 AM    Prescriptions: ADDERALL XR 25 MG XR24H-CAP (AMPHETAMINE-DEXTROAMPHETAMINE) Take 1 tablet by mouth once a day  #30 x 0   Entered and Authorized by:   Nani Gasser MD   Signed by:   Nani Gasser MD on 05/13/2009   Method used:   Print then Give to Patient   RxID:   [id number]

## 2010-10-21 NOTE — Progress Notes (Signed)
Summary: Adderall refill  Phone Note Refill Request Message from:  Patient on September 21, 2009 10:36 AM  Refills Requested: Medication #1:  ADDERALL XR 25 MG XR24H-CAP Take 1 tablet by mouth once a day   Supply Requested: 1 month  Method Requested: Pick up at Office Initial call taken by: Kathlene November,  September 21, 2009 10:36 AM    Prescriptions: ADDERALL XR 25 MG XR24H-CAP (AMPHETAMINE-DEXTROAMPHETAMINE) Take 1 tablet by mouth once a day  #30 x 0   Entered and Authorized by:   Nani Gasser MD   Signed by:   Nani Gasser MD on 09/21/2009   Method used:   Print then Give to Patient   RxID:   [id number]   Appended Document: Adderall refill Prescriptions: ADDERALL XR 25 MG XR24H-CAP (AMPHETAMINE-DEXTROAMPHETAMINE) Take 1 tablet by mouth once a day  #30 x 0   Entered and Authorized by:   Nani Gasser MD   Signed by:   Nani Gasser MD on 10/23/2009   Method used:   Print then Give to Patient   RxID:   [id number]

## 2010-10-21 NOTE — Progress Notes (Signed)
Summary: Pt. wants to pick up Rx.   Phone Note Refill Request Message from:  Patient on August 26, 2010 10:14 AM  Refills Requested: Medication #1:  ADDERALL XR 25 MG XR24H-CAP Take 1 tablet by mouth once a day   Dosage confirmed as above?Dosage Confirmed Pt requested a Rx. to be left at the front office for her... Any questions, call her at [phone].Michaelle Copas  August 26, 2010 10:15 AM   Initial call taken by: Michaelle Copas,  August 26, 2010 10:15 AM    Prescriptions: ADDERALL XR 25 MG XR24H-CAP (AMPHETAMINE-DEXTROAMPHETAMINE) Take 1 tablet by mouth once a day  #30 x 0   Entered by:   Kathlene November LPN   Authorized by:   Nani Gasser MD   Signed by:   Nani Gasser MD on 08/27/2010   Method used:   Print then Give to Patient   RxID:   [id number]

## 2010-10-21 NOTE — Medication Information (Signed)
Summary: Approval for Adderall/[facility]  Approval for Adderall/[facility]   Imported By: Lanelle Bal 09/04/2009 10:39:12  _____________________________________________________________________  External Attachment:    Type:   Image     Comment:   External Document

## 2010-10-21 NOTE — Assessment & Plan Note (Signed)
Summary: FLU SHOT  Nurse Visit   Vitals Entered By: Kathlene November (July 01, 2009 4:05 PM) Flu Vaccine Consent Questions     Do you have a history of severe allergic reactions to this vaccine? no    Any prior history of allergic reactions to egg and/or gelatin? no    Do you have a sensitivity to the preservative Thimersol? no    Do you have a past history of Guillan-Barre Syndrome? no    Do you currently have an acute febrile illness? no    Have you ever had a severe reaction to latex? no    Vaccine information given and explained to patient? yes    Are you currently pregnant? no    Lot Number:[id number]   Exp Date:03/18/2010   Site Given  Left Deltoid IM  Allergies: 1)  ! Codeine  Orders Added: 1)  Admin 1st Vaccine [90471] 2)  Flu Vaccine 18yrs + [03474]

## 2010-10-21 NOTE — Assessment & Plan Note (Signed)
Summary: NOV; Episodes of shakiness and lightheadedness   Vital Signs:  Patient Profile:   55 Years Old Female Height:     61.5 inches Weight:      140 pounds Pulse rate:   76 / minute BP sitting:   93 / 60  (left arm) Cuff size:   regular  Vitals Entered By: Kathlene November (May 09, 2007 9:47 AM)               PCP:  Nani Gasser MD  Chief Complaint:  New Pt. GYN referred her to have blood sugar looked at. States she has been having light-headednessand will get shaky and then eats and sx's go away..  History of Present Illness: Episodes have happened since a teenager. Had 2 episodes on once days last week. Called her Gyn and they referred her for further evaluation.   Ater BF and then all the sudden felt hungry and got very shakey, Ate and then felt better in about 20 minutes.  Normally eats 3 meals a day.  Does have a problem with acid reflux, worse with raw foods. Hasnormal thyroid.  No family hx of Diabetes.  Also takes Vitamin D for defiecieny. Fasting for 12 hours.  No true syncope.     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      Never Smoked    Alcohol use-yes    Drug use-no    Regular exercise-yes   Risk Factors:  Tobacco use:  never Drug use:  no Caffeine use:  0 drinks per day Alcohol use:  yes    Drinks per day:  0 Exercise:  yes    Times per week:  5  Family History Risk Factors:    Family History of MI in females < 36 years old:  no    Family History of MI in males < 49 years old:  no   Review of Systems       No fever/sweats. + weakness, unexplained weight loss/gain.  No vison changes.  No difficulty hearing/ringing in  ears, hay fever/allergies.  No chest pain/discomfort, palpitations.  No Br lump/nipple discharge.  No cough/wheeze.  No blood in BM, nausea/vomiting/diarrhea.  No nighttime urination, leaking urine, unusual vaginal bleeding, discharge (penis or vagina).  No muscle/joint pain. No rash, change in mole.  No HA, memory loss.  No anxiety, sleep d/o, depression.  No easy bruising/bleeding, unexplained lump    Physical Exam  General:     Well-developed,well-nourished,in no acute distress; alert,appropriate and cooperative throughout examination Head:     Normocephalic and atraumatic without obvious abnormalities. No apparent alopecia or balding. Eyes:     No corneal or conjunctival inflammation noted. EOMI. Perrla.  Nose:     External nasal examination shows no deformity or inflammation. Mouth:     Oral mucosa and oropharynx without lesions or exudates.  Teeth in good repair. Neck:     No deformities, masses, or tenderness noted. Lungs:     Normal respiratory effort, chest expands symmetrically. Lungs are clear to auscultation, no crackles or wheezes. Heart:     Normal rate and regular rhythm. S1 and S2  normal without gallop, murmur, click, rub or other extra sounds.  No carotid bruits.  Abdomen:     Bowel sounds positive,abdomen soft and non-tender without masses, organomegaly or hernias noted. Pulses:     Radial 2+ Neurologic:     alert & oriented X3 and cranial nerves II-XII intact.      Impression & Recommendations:  Problem # 1:  DIZZINESS (ICD-780.4) Unclear etiology, Suspect related to low CBGS.  Thyroid is normal. Will Check CMET and urine for proteinuria.  Recommend eat consistant meals including snack incase having low CBGs.  Fu if not improving.  Orders: Capillary Blood Glucose (16109) Fingerstick (60454) T-Comprehensive Metabolic Panel (09811-91478) Urinalysis-dipstick w/o micro (81003)   Problem # 2:  HEMATURIA (ICD-599.7) Urine + for blood so will send for culture.   If culture is neg then can recheck for blook in 2 weeks.  Orders: T-Urine Culture, Bacteria (29562)   Complete Medication List: 1)  Nortrel 1/35 (28) 1-35 Mg-mcg Tabs (Norethindrone-eth estradiol) .... Take one tablet by mouth once aday 2)  Zegerid 40-1100 Mg Caps (Omeprazole-sodium bicarbonate) .... Take one tablet by mouth once a day 3)  Serafen 10mg   .... Take 1-2 tablets daily as needed      Laboratory Results   Urine Tests  Date/Time Recieved: 05/09/07 @ 10:30am Date/Time Reported: 05/09/07 @ 10:30am  Routine Urinalysis   Color: yellow Appearance: Clear Glucose: negative   (Normal Range: Negative) Bilirubin: negative   (Normal Range: Negative) Ketone: negative   (Normal Range: Negative) Spec. Gravity: 1.015   (Normal Range: 1.003-1.035) Blood: trace-intact   (Normal Range: Negative) pH: 8.5   (Normal Range: 5.0-8.0) Protein: negative   (Normal Range: Negative) Urobilinogen: 0.2   (Normal Range: 0-1) Nitrite: negative   (Normal Range: Negative) Leukocyte Esterace: negative   (Normal Range: Negative)     Blood Tests   Date/Time Recieved: 05/09/07 @ 10:00am Date/Time Reported: 05/09/07 @ 10:00am  Glucose (fasting): 81 mg/dL   (Normal Range: 13-086)       Tetanus/Td Immunization History:    Tetanus/Td # 1:  Historical (09/19/2005)

## 2010-10-21 NOTE — Progress Notes (Signed)
Summary: Adderall refill  Phone Note Refill Request Message from:  Patient on April 08, 2010 11:47 AM  Refills Requested: Medication #1:  ADDERALL XR 25 MG XR24H-CAP Take 1 tablet by mouth once a day   Supply Requested: 1 month pick up in office tomorrow   Method Requested: Pick up at Office Initial call taken by: Kathlene November,  April 08, 2010 11:48 AM  Follow-up for Phone Call        pt has not been seen in > 1 yr.  Will not RF. Follow-up by: Seymour Bars DO,  April 08, 2010 12:02 PM

## 2010-10-21 NOTE — Medication Information (Signed)
Summary: Letter Regarding Dextroamphetamine/[facility]  Letter Regarding Dextroamphetamine/[facility]   Imported By: Lanelle Bal 07/15/2009 08:55:00  _____________________________________________________________________  External Attachment:    Type:   Image     Comment:   External Document

## 2010-10-21 NOTE — Assessment & Plan Note (Signed)
Summary: ADDERAL FU   Vital Signs:  Patient Profile:   55 Years Old Female Height:     61.5 inches Weight:      125 pounds BMI:     23.32 Pulse rate:   102 / minute BP sitting:   87 / 61  (left arm) Cuff size:   regular  Vitals Entered By: Kathlene November (March 18, 2008 2:19 PM)                 PCP:  Nani Gasser MD  Chief Complaint:  followup Adderall and Prozac.  History of Present Illness: See A&P for history.     Current Allergies: ! CODEINE      Physical Exam  General:     Well-developed,well-nourished,in no acute distress; alert,appropriate and cooperative throughout examination Head:     Normocephalic and atraumatic without obvious abnormalities. No apparent alopecia or balding. Lungs:     Normal respiratory effort, chest expands symmetrically. Lungs are clear to auscultation, no crackles or wheezes. Heart:     Normal rate and regular rhythm. S1 and S2 normal without gallop, murmur, click, rub or other extra sounds.    Impression & Recommendations:  Problem # 1:  ADD (ICD-314.00) 15 pound weigh loss since starting the medication. Says has been going to the gym. Dicussed also needing  to really watch her weight.  Based on her height she should be 102-128. Says doing well on the Adderral nad feel it does help with he focus ability.     Problem # 2:  MENOPAUSAL DISORDER (ICD-627.9) No episodes since April.  Mood feels very stable. Doing the Prozac the week before and of her period.  Stopped her Effexor adn it made her feel bad.  Did have alot of heavy cramping with her last period.  just started the Yaz this week.   Fu in 4-6 months.   Complete Medication List: 1)  Yaz 3-0.02 Mg Tabs (Drospirenone-ethinyl estradiol) .... Take 1 tablet by mouth once a day 2)  Zegerid 40-1100 Mg Caps (Omeprazole-sodium bicarbonate) .... Take one tablet by mouth once a day 3)  Adderall Xr 20 Mg Cp24 (Amphetamine-dextroamphetamine) .Marland Kitchen.. 1 tab by mouth qam 4)   Fluoxetine Hcl 10 Mg Tabs (Fluoxetine hcl) .... Take 1 tablet by mouth once a day    Prescriptions: ADDERALL XR 20 MG  CP24 (AMPHETAMINE-DEXTROAMPHETAMINE) 1 tab by mouth qAM  #31 x 0   Entered and Authorized by:   Nani Gasser MD   Signed by:   Nani Gasser MD on 03/18/2008   Method used:   Print then Give to Patient   RxID:   [id number]  ]

## 2010-10-21 NOTE — Progress Notes (Signed)
  Phone Note Refill Request Message from:  Patient on June 16, 2010 9:37 AM  Refills Requested: Medication #1:  ADDERALL XR 25 MG XR24H-CAP Take 1 tablet by mouth once a day   Supply Requested: 1 month  Method Requested: Pick up at Office Initial call taken by: Kathlene November LPN,  June 16, 2010 9:38 AM    Prescriptions: ADDERALL XR 25 MG XR24H-CAP (AMPHETAMINE-DEXTROAMPHETAMINE) Take 1 tablet by mouth once a day  #30 x 0   Entered and Authorized by:   Nani Gasser MD   Signed by:   Nani Gasser MD on 06/16/2010   Method used:   Printed then faxed to ...       [facility]  [address] [phone]* (retail)       [address] [place], [place]  [address]       Ph: [phone] or [phone]       Fax: [phone]   RxID:   [id number]

## 2010-10-21 NOTE — Progress Notes (Signed)
Summary: Adderal Refill  Phone Note Refill Request Call back at Home Phone [phone] Message from:  Patient on November 21, 2008 10:08 AM  Refills Requested: Medication #1:  ADDERALL XR 25 MG XR24H-CAP Take 1 tablet by mouth once a day Pt will pick up on Monday   Method Requested: Pick up at Office Initial call taken by: Kathlene November,  November 21, 2008 10:08 AM Caller: Patient Call For: Nani Gasser MD      Prescriptions: ADDERALL XR 25 MG XR24H-CAP (AMPHETAMINE-DEXTROAMPHETAMINE) Take 1 tablet by mouth once a day  #30 x 0   Entered and Authorized by:   Nani Gasser MD   Signed by:   Nani Gasser MD on 11/21/2008   Method used:   Print then Give to Patient   RxID:   [id number]

## 2010-10-21 NOTE — Progress Notes (Signed)
Summary: Refill  Phone Note Refill Request Message from:  Patient on November 03, 2008 9:48 AM  Refills Requested: Medication #1:  ADDERALL XR 25 MG XR24H-CAP Take 1 tablet by mouth once a day  Method Requested: Pick up at Office Initial call taken by: Kathlene November,  November 03, 2008 9:48 AM      Prescriptions: ADDERALL XR 25 MG XR24H-CAP (AMPHETAMINE-DEXTROAMPHETAMINE) Take 1 tablet by mouth once a day  #30 x 0   Entered and Authorized by:   Nani Gasser MD   Signed by:   Nani Gasser MD on 11/03/2008   Method used:   Print then Give to Patient   RxID:   [id number]

## 2010-10-21 NOTE — Progress Notes (Signed)
Summary: adderall refill  Phone Note Refill Request Message from:  Patient on June 11, 2009 10:04 AM  Refills Requested: Medication #1:  ADDERALL XR 25 MG XR24H-CAP Take 1 tablet by mouth once a day   Supply Requested: 1 month  Method Requested: Pick up at Office Initial call taken by: Kathlene November,  June 11, 2009 10:04 AM Caller: Patient Call For: Nani Gasser MD    Prescriptions: ADDERALL XR 25 MG XR24H-CAP (AMPHETAMINE-DEXTROAMPHETAMINE) Take 1 tablet by mouth once a day  #30 x 0   Entered and Authorized by:   Nani Gasser MD   Signed by:   Nani Gasser MD on 06/11/2009   Method used:   Print then Give to Patient   RxID:   [id number]

## 2010-10-21 NOTE — Progress Notes (Signed)
Summary: Adderall Refill  Phone Note Refill Request Call back at Home Phone [phone] Message from:  Patient  Refills Requested: Medication #1:  ADDERALL XR 25 MG XR24H-CAP Take 1 tablet by mouth once a day   Dosage confirmed as above?Dosage Confirmed   Supply Requested: 1 month   Last Refilled: 08/27/2010  Method Requested: Pick up at Office Initial call taken by: Francee Piccolo CMA Duncan Dull),  October 04, 2010 9:29 AM    Prescriptions: ADDERALL XR 25 MG XR24H-CAP (AMPHETAMINE-DEXTROAMPHETAMINE) Take 1 tablet by mouth once a day  #30 x 0   Entered and Authorized by:   Nani Gasser MD   Signed by:   Nani Gasser MD on 10/04/2010   Method used:   Print then Give to Patient   RxID:   [id number]

## 2010-10-21 NOTE — Progress Notes (Signed)
  Phone Note Call from Patient Call back at Home Phone [phone]   Caller: Patient Call For: Nani Gasser MD Summary of Call: Pt needs Rx for Aderrall. Will pick up tomorrow. KJ LPN Initial call taken by: Kathlene November,  May 21, 2008 10:58 AM      Prescriptions: ADDERALL XR 20 MG  CP24 (AMPHETAMINE-DEXTROAMPHETAMINE) 1 tab by mouth qAM  #31 x 0   Entered and Authorized by:   Nani Gasser MD   Signed by:   Nani Gasser MD on 05/21/2008   Method used:   Print then Give to Patient   RxID:   [id number]

## 2010-10-21 NOTE — Assessment & Plan Note (Signed)
Summary: [facility] DRAINAGE/KH room 1   Vital Signs:  Patient Profile:   55 Years Old Female CC:      Cold & URI symptoms, sore throat, earache, clear drainage, headache x 9 days Height:     61.5 inches Weight:      128 pounds O2 Sat:      100 % Temp:     98.1 degrees F oral Pulse rate:   76 / minute Pulse rhythm:   regular Resp:     18 per minute BP sitting:   119 / 77  (left arm) Cuff size:   regular  Pt. in pain?   yes    Location:   head    Intensity:   7    Type:       aching  Vitals Entered By: Emilio Math (June 10, 2008 6:38 PM)                   Current Allergies: ! CODEINE History of Present Illness History from: patient Chief Complaint: Cold & URI symptoms, sore throat, earache, clear drainage, headache x 9 days History of Present Illness: 55 yo female with one week history of scratchy painful throat that feels like it is swelling.  Positive ear pain, sore throat, headaches, body aches, no fevers, mild chills.  She describes dizziness x 1 day.  NO vomitting, no diarrhea.  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  Pt was told to try mucinex last night and had some relief last night.     PMH: ADD, Menopause PSH: Left Hip Replacement: Nov 1999 FH: Mother: Robyne Peers, Healthy Father, Deceased age 54 Brain CA Brother, 64, Healthy Sister, 21, Healthy    Patient Instructions: 1)  1.  Drink plenty of fluids and get lots of rest.  Use tylenol 650 mg every 6 hours (4000mg  max per day) or ibuprofen 400-600 mg every 8 hours(with food) for fevers, chills and aches. 2)  2.  Over the counter cough and cold preparations such as (Mucinex-D) may be used to treat your cold symptoms.  Afrin nasal spray (2 sprays each nostril 2 times a day for three days) may be used to relieve sinus congestion. 3)  3.  If you are not getting better in 7-10 days please follow up here or with your primary care  provider.  If you are getting worse and need to be seen sooner, please, return to the clinic.   REVIEW OF SYSTEMS  Constitutional Symptoms       Complains of body aches.     Denies fever, chills, headaches, and sleep disorders.      Comments: Fatigue  Eyes       Denies blurred vision, eye discharge, and eye redness. Ear/Nose/Throat/Mouth       Complains of sinus problems, sore throat, and ear pain.      Denies nasal discharge and ear discharge.      Comments: Dizziness, Hoarseness Respiratory       Complains of frequent cough.      Denies wheezing and shortness of breath.  Cardiovascular       Denies chest pain, palpitations, peripheral edema, and claudication.  Gastrointestinal       Denies abdominal pain, nausea/vomiting, diarrhea, constipation, rectal bleeding, indigestion/heartburn, and dysphagia.  Genitourniary       Denies difficulty urinating, urination frequency, urination urgency, hematuria, incontinence, and nocturia.  Musculoskeletal  Denies back pain, joint pain, joint swelling, muscle weakness, and stiffness.  Skin       Denies dryness, rash, and itching.  Neuro       Complains of headache.      Denies numbness, weakness, dizziness, difficulty walking, and frequent falls.  Psych       Denies depression, stress, anxiety, memory loss, and confusion.  Physical Exam General appearance: well developed, well nourished, no acute distress Head: normocephalic, atraumatic Eyes: conjunctivae and lids normal Pupils: equal, round, reactive to light Ears: normal, no lesions or deformities Nasal: swollen red turbinates with congestion Oral/Pharynx: pharyngeal erythema without exudate, uvula midline without deviation Chest/Lungs: no rales, wheezes, or rhonchi bilateral, breath sounds equal without effort Heart: regular rate and  rhythm, no murmur Abdomen: soft, non-tender without obvious organomegaly Skin: no obvious rashes or lesions MSE: oriented to time, place, and person     Assessment New Problems: UPPER RESPIRATORY INFECTION, ACUTE (ICD-465.9)   Patient Education: Patient and/or caregiver instructed in the following: rest, fluids, Tylenol prn, Ibuprofen prn. Demonstrates willingness to comply.  Plan New Orders: New Patient Level III [16109] Planning Comments:   1.  Drink plenty of fluids and get lots of rest.  Use tylenol 650 mg every 6 hours (4000mg  max per day) or ibuprofen 400-600 mg every 8 hours(with food) for fevers, chills and aches. 2.  Over the counter cough and cold preparations such as (Mucinex-D) may be used to treat your cold symptoms.  Afrin nasal spray (2 sprays each nostril 2 times a day for three days) may be used to relieve sinus congestion. 3.  If you are not getting better in 7-10 days please follow up here or with your primary care provider.  If you are getting worse and need to be seen sooner, please, return to the clinic.    The patient and/or caregiver has been counseled thoroughly with regard to medications prescribed including dosage, schedule, interactions, rationale for use, and possible side effects and they verbalize understanding.  Diagnoses and expected course of recovery discussed and will return if not improved as expected or if the condition worsens. Patient and/or caregiver verbalized understanding.    ]  Appended Document: EARACHE,SORE THROAT,ACHES,AND DRAINAGE/KH room 1 AGREE WITH TREATMENT

## 2010-10-21 NOTE — Assessment & Plan Note (Signed)
Summary: URI/ ear pain   Vital Signs:  Patient Profile:   55 Years Old Female Height:     61.5 inches Weight:      144 pounds BMI:     26.86 O2 Sat:      99 % Temp:     98.2 degrees F oral Pulse rate:   81 / minute BP sitting:   104 / 65  (left arm) Cuff size:   regular  Vitals Entered By: Harlene Salts (August 29, 2007 1:00 PM)                 Visit Type:  acute PCP:  Nani Gasser MD  Chief Complaint:  RIGHT EAR PAIN and NEED VEIN PROBLEM IN LEGS RECHECKED.  History of Present Illness: 55 yo WF presents for R ear pain that started yesterday with URI symptoms for 4 days including rhinorrhea, head congestion and sore throat.  Denies F/C.  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  No GI upset or cough.  Took some Contact today.    She is also f/u for varicose veins bilateral with chronic venous insufficiency, seeing  Dr Donia Ast.  Had u/s and is planning to have vein procedure early Feb.  She is wearing compression hose.  Still has ankle edema and taking Ibuprofen as needed.  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 Married to [employer].      Never Smoked    Alcohol use-yes    Drug use-no    Regular exercise-yes    No kids    Review of Systems      See HPI   Physical Exam  General:     alert, well-developed, well-nourished, and well-hydrated.  alert, well-developed, well-nourished, and well-hydrated.   Head:     normocephalic and atraumatic.  sinuses NTTPnormocephalic and atraumatic.   Eyes:     watery eyes, clear conjunctiva Ears:     both ears dull otherwise normal Nose:     clear rhinorrhea, red nose  at tip Mouth:     good dentition and pharynx pink and moist.  good dentition and pharynx pink and moist.   Neck:     supple and no masses.  supple and no masses.   Lungs:     Normal respiratory effort, chest expands symmetrically. Lungs are clear to auscultation, no crackles or wheezes. Heart:     Normal rate and regular rhythm. S1 and S2 normal without gallop, murmur, click, rub or other extra sounds. Extremities:     2+ nonpitting bilat ankle edema Skin:     color normal.  color normal.   Cervical Nodes:     mild anterior cervical LA Psych:     tearful    Impression & Recommendations:  Problem # 1:  EAR PAIN, RIGHT (ICD-388.70) Ear pain w/ normal ear exam.  Has some popping at the TMJ and hx of grinding.  Recenlty had dental exam.  All can cause ear pain.  Given her head congestion and 'popping' in the ear, it is likley from eustachian tube dysfunction and will be helped  by taking Claritin D OTC with Ibuprofen as needed.  Problem # 2:  URI (ICD-465.9) Instructed on symptomatic treatment. Call if symptoms persist or worsen.   Problem # 3:  UNSPECIFIED VENOUS INSUFFICIENCY (ICD-459.81) Venous insufficiency with varicose veins, seen by Dr Donia Ast.  Has f/u in Feb.  Not responding well to compression hose treatment.  Complete Medication List: 1)  Nortrel 1/35 (28) 1-35 Mg-mcg Tabs (Norethindrone-eth estradiol) .... Take one tablet by mouth once aday 2)  Zegerid 40-1100 Mg Caps (Omeprazole-sodium bicarbonate) .... Take one tablet by mouth once a day 3)  Serafen 10mg   .... Take 1-2 tablets daily as needed   Patient Instructions: 1)  Ibuprofen or Tylenol for ear pain, and bodyaches. 2)  Use Claritin D -- take for eustachian tube dysfunction and congestion. 3)  Return in 1-2 wks for ADD testing.    ]

## 2010-10-21 NOTE — Assessment & Plan Note (Signed)
Summary: FU ADD, mood   Vital Signs:  Patient Profile:   55 Years Old Female Height:     61.5 inches Weight:      126 pounds Pulse rate:   87 / minute BP sitting:   105 / 69  (left arm) Cuff size:   regular  Vitals Entered By: Kathlene November (August 28, 2008 4:21 PM)                 PCP:  Nani Gasser MD  Chief Complaint:  checkup Adderall.  History of Present Illness: Overall doing well. Feels she responds well to the medication. Says usually takes it around 6AM and then feels it wears off by 11AM.  Wants it to last longer but not affect her sleep. With the Holidays it has been a little more stressful. Asked her husband and he felt she had been more unorganized lately.     Current Allergies: ! CODEINE        Impression & Recommendations:  Problem # 1:  ADD (ICD-314.00) Feels ther Adderral if wearing off too early.  BUt does work well.  Discussed options. Will try increasing Adderall to 25mg .  If still wearing off too early consider a short acting dose around lunch in addition to her morning dose of 20mg  and see if helps her get thought the afternoon. She will call in one month to let me know how she is doing. 22 min spent face to face.   Problem # 2:  MENOPAUSAL DISORDER (ICD-627.9) Doing well on the Prozac. Continue for know. It has helped stabilize her mood.   Complete Medication List: 1)  Yaz 3-0.02 Mg Tabs (Drospirenone-ethinyl estradiol) .... Take 1 tablet by mouth once a day 2)  Zegerid 40-1100 Mg Caps (Omeprazole-sodium bicarbonate) .... Take one tablet by mouth once a day 3)  Adderall Xr 25 Mg Xr24h-cap (Amphetamine-dextroamphetamine) .... Take 1 tablet by mouth once a day 4)  Fluoxetine Hcl 10 Mg Tabs (Fluoxetine hcl) .... Take 1 tablet by mouth once a day 5)  [facility] Ibuprofen 200 Mg Tabs (Ibuprofen) 6)  Mucinex 600 Mg Xr12h-tab (Guaifenesin)    Prescriptions: ADDERALL XR 25 MG XR24H-CAP (AMPHETAMINE-DEXTROAMPHETAMINE) Take 1 tablet by mouth once a  day  #30 x 0   Entered and Authorized by:   Nani Gasser MD   Signed by:   Nani Gasser MD on 08/28/2008   Method used:   Print then Give to Patient   RxID:   [id number]  ]

## 2010-10-21 NOTE — Assessment & Plan Note (Signed)
Summary: FU Mood Swings   Vital Signs:  Patient Profile:   55 Years Old Female Height:     61.5 inches Weight:      132 pounds O2 Sat:      100 % Pulse rate:   102 / minute BP sitting:   115 / 80  (left arm) Cuff size:   regular  Vitals Entered By: Harlene Salts (January 01, 2008 3:59 PM)                 PCP:  Nani Gasser MD  Chief Complaint:  follow-up mood and ADD.  History of Present Illness: Did go see her OB[facility] and saw a new PA there. They did some blood work and hasn't heard the results back yet.  They had also recommended couseling and this really upset her.  She really does feel it is hormonal.  It started about the week before her period and about a week after.  After that felt much better.  She is on birth control and wants to know if can stop it. She bleeds for one day and it is such a small amt just wears a panty liner.     Current Allergies: ! CODEINE      Physical Exam  General:     Well-developed,well-nourished,in no acute distress; alert,appropriate and cooperative throughout examination    Impression & Recommendations:  Problem # 1:  MENOPAUSAL DISORDER (ICD-627.9) Discussed starting Effexor for the week before and during hre period. Pt agrees to try it.  Will FU in 2 months.   Complete Medication List: 1)  Aviane 0.1-20 Mg-mcg Tabs (Levonorgestrel-ethinyl estrad) .... Take 1 tablet by mouth once a day 2)  Zegerid 40-1100 Mg Caps (Omeprazole-sodium bicarbonate) .... Take one tablet by mouth once a day 3)  Adderall Xr 20 Mg Cp24 (Amphetamine-dextroamphetamine) .Marland Kitchen.. 1 tab by mouth qam 4)  Effexor 37.5 Mg Tabs (Venlafaxine hcl) .... Take 1 tablet by mouth two times a day     Prescriptions: EFFEXOR 37.5 MG  TABS (VENLAFAXINE HCL) Take 1 tablet by mouth two times a day  #60 x 1   Entered and Authorized by:   Nani Gasser MD   Signed by:   Nani Gasser MD on 01/01/2008   Method used:   [facility] to ...       [facility]   [address] [id number]       [address] [place], [place]         Ph: [phone] or [phone]       Fax: [phone]   RxID:   [id number]  ]

## 2010-11-03 ENCOUNTER — Telehealth: Payer: Self-pay | Admitting: Family Medicine

## 2010-11-10 ENCOUNTER — Encounter: Payer: Self-pay | Admitting: Family Medicine

## 2010-11-10 ENCOUNTER — Ambulatory Visit (INDEPENDENT_AMBULATORY_CARE_PROVIDER_SITE_OTHER): Payer: BC Managed Care – PPO | Admitting: Family Medicine

## 2010-11-10 ENCOUNTER — Telehealth (INDEPENDENT_AMBULATORY_CARE_PROVIDER_SITE_OTHER): Payer: Self-pay | Admitting: *Deleted

## 2010-11-10 DIAGNOSIS — R0789 Other chest pain: Secondary | ICD-10-CM | POA: Insufficient documentation

## 2010-11-10 DIAGNOSIS — R1084 Generalized abdominal pain: Secondary | ICD-10-CM | POA: Insufficient documentation

## 2010-11-10 NOTE — Progress Notes (Signed)
Summary: [facility]-Refill Adderall  Phone Note Refill Request Call back at Work Phone [phone] Message from:  Patient  Refills Requested: Medication #1:  ADDERALL XR 25 MG XR24H-CAP Take 1 tablet by mouth once a day   Dosage confirmed as above?Dosage Confirmed   Supply Requested: 1 month   Last Refilled: 10/04/2010 pt aware rx will not be ready until 11/04/10.   Method Requested: Pick up at Office Initial call taken by: Francee Piccolo CMA Duncan Dull),  November 03, 2010 2:43 PM    Prescriptions: ADDERALL XR 25 MG XR24H-CAP (AMPHETAMINE-DEXTROAMPHETAMINE) Take 1 tablet by mouth once a day  #30 x 0   Entered and Authorized by:   Nani Gasser MD   Signed by:   Nani Gasser MD on 11/04/2010   Method used:   Print then Give to Patient   RxID:   [id number]

## 2010-11-11 LAB — CONVERTED CEMR LAB
ALT: 15 units/L (ref 0–35)
AST: 20 units/L (ref 0–37)
Albumin: 4.2 g/dL (ref 3.5–5.2)
Alkaline Phosphatase: 48 units/L (ref 39–117)
Amylase: 77 units/L (ref 0–105)
BUN: 16 mg/dL (ref 6–23)
CO2: 25 meq/L (ref 19–32)
Calcium: 11 mg/dL — ABNORMAL HIGH (ref 8.4–10.5)
Chloride: 100 meq/L (ref 96–112)
Creatinine, Ser: 0.83 mg/dL (ref 0.40–1.20)
Glucose, Bld: 82 mg/dL (ref 70–99)
HCT: 37.5 % (ref 36.0–46.0)
Hemoglobin: 12.9 g/dL (ref 12.0–15.0)
Lipase: 33 units/L (ref 0–75)
Lymphocytes Relative: 31 % (ref 12–46)
Lymphs Abs: 2.4 10*3/uL (ref 0.7–4.0)
MCHC: 34.4 g/dL (ref 30.0–36.0)
MCV: 89.6 fL (ref 78.0–100.0)
Monocytes Absolute: 0.5 10*3/uL (ref 0.1–1.0)
Monocytes Relative: 7 % (ref 3–12)
Neutro Abs: 4.8 10*3/uL (ref 1.7–7.7)
Neutrophils Relative %: 62 % (ref 43–77)
Platelets: 285 10*3/uL (ref 150–400)
Potassium: 4.9 meq/L (ref 3.5–5.3)
RBC: 4.19 M/uL (ref 3.87–5.11)
RDW: 11.9 % (ref 11.5–15.5)
Sodium: 136 meq/L (ref 135–145)
Total Bilirubin: 0.6 mg/dL (ref 0.3–1.2)
Total CK: 51 units/L (ref 7–177)
Total Protein: 6.8 g/dL (ref 6.0–8.3)
Troponin I: <0.01 ng/mL (ref <0.06)
WBC: 7.7 10*3/uL (ref 4.0–10.5)

## 2010-11-16 NOTE — Assessment & Plan Note (Signed)
Summary: Chest and abdominal Pain  Nurse Visit   Vital Signs:  Patient profile:   55 year old female Height:      61.5 inches Weight:      129 pounds Pulse rate:   78 / minute BP sitting:   102 / 65  (right arm) Cuff size:   regular  Vitals Entered By: Avon Gully CMA, Duncan Dull) (November 10, 2010 10:53 AM)  Primary Care Provider:  Nani Gasser MD  CC:  burning sensation in stomach since the weekend and indigestion?.  History of Present Illness: burning sensation in stomach since the weekend,indigestion. Started about 4 days ago after eating pop corn at the movies. Feels like has lots of gas in her stomach adn her bowels.  Pain from her mid abdomen to her lower neck.  Felts tight in her chest last night so slept on 2 pillows.  Taking TUMS adn it will help for maybe in hour. Having normal BMs. No blood in the stool. Last BM was last night. Describes as a burning an aching .Also under both breasts, radiating horizontally. No nausea.  Has been stressed at work. Hx of GI ulcers when she a teenager. No vomiting. No family history of premature heart disease.   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 Married to [employer].   Never Smoked Alcohol use-yes Drug use-no Regular exercise-yes No kids   Physical Exam  General:  Well-developed,well-nourished,in no acute distress; alert,appropriate and cooperative throughout examination Head:  Normocephalic and atraumatic without obvious abnormalities. No apparent alopecia or balding. Eyes:  No corneal or conjunctival inflammation noted. EOMI. Perrla. Funduscopic exam benign, without hemorrhages,  exudates or papilledema. Vision grossly normal. Mouth:  Oral mucosa and oropharynx without lesions or exudates.  Teeth in good repair. Neck:  No deformities, masses, or tenderness noted. Lungs:  Normal respiratory effort, chest expands symmetrically. Lungs are clear to auscultation, no crackles or wheezes. Heart:  Normal rate and regular rhythm. S1 and S2 normal without gallop, murmur, click, rub or other extra sounds. NO carotid or abdominal bruits.  Abdomen:  soft, normal bowel sounds, no distention, no masses, no hepatomegaly, and no splenomegaly.   Mildy tender in the RUQ.  Pulses:  RAdial 2+ bilat.  Neurologic:  alert & oriented X3.   Skin:  no rashes.   Cervical Nodes:  No lymphadenopathy noted Psych:  Cognition and judgment appear intact. Alert and cooperative with normal attention span and concentration. No apparent delusions, illusions, hallucinations   Impression & Recommendations:  Problem # 1:  CHEST PAIN, ATYPICAL (ICD-786.59)  Exam is normal.  Will get EKG. this could likely be GERD.  also consider GB or pancreatitis. Diverticulitis less likely with no fever or change in BMs.   Will start a PPI for possible treatment.  Will check CBC, pancreas and liver enzymes.  EKG shows rate of 80 bpm, NSR, no acute changes She did get some relief with the GI cocktail.  Orders: T-CBC w/Diff [phone]) T-CK MB Fract ([id number]) T-CK Isoenzymes [phone]) T- * Misc. Laboratory test [phone]) EKG w/ Interpretation (93000)  Problem # 2:  ABDOMINAL PAIN, GENERALIZED (ICD-789.07)  this could likely be GERD as it does radiate into her mid chest up  to her neck.  Also consider GB or pancreatitis. Diverticulitis less likely with no fever or change in BMs.   Will start a PPI for possible treatment.  Orders: T-CBC w/Diff ([id number]) T-Comprehensive Metabolic Panel [phone]) T-Amylase [phone]) T-Lipase ([phone])  Complete Medication List: 1)  Yaz 3-0.02 Mg Tabs  (Drospirenone-ethinyl estradiol) .... Take 1 tablet by mouth once a day 2)  Adderall Xr 25 Mg Xr24h-cap (Amphetamine-dextroamphetamine) .... Take 1 tablet by mouth once a day 3)  Fluoxetine Hcl 10 Mg Tabs (Fluoxetine hcl) .... Take 1 tablet by mouth once a day 4)  Fish Oil 1000 Mg Caps (Omega-3 fatty acids) .... Take one tablet by mouth once a day 5)  Multivitamins Tabs (Multiple vitamin) .... Take one tablet by mouth once a day 6)  Meloxicam 7.5 Mg Tabs (Meloxicam) .... Take one tablet by mouth once a day   CC: burning sensation in stomach since the weekend,indigestion?   Current Medications (verified): 1)  Yaz 3-0.02 Mg  Tabs (Drospirenone-Ethinyl Estradiol) .... Take 1 Tablet By Mouth Once A Day 2)  Adderall Xr 25 Mg Xr24h-Cap (Amphetamine-Dextroamphetamine) .... Take 1 Tablet By Mouth Once A Day 3)  Fluoxetine Hcl 10 Mg  Tabs (Fluoxetine Hcl) .... Take 1 Tablet By Mouth Once A Day 4)  Fish Oil 1000 Mg Caps (Omega-3 Fatty Acids) .... Take One Tablet By Mouth Once A Day 5)  Multivitamins  Tabs (Multiple Vitamin) .... Take One Tablet By Mouth Once A Day 6)  Meloxicam 7.5 Mg Tabs (Meloxicam) .... Take One Tablet By Mouth Once A Day  Allergies (verified): 1)  ! Codeine  Comments:  Nurse/Medical Assistant: The patient's medications and allergies were reviewed with the patient and were updated in the Medication and Allergy Lists. Avon Gully CMA, Duncan Dull) (November 10, 2010 10:54 AM)  Orders Added: 1)  T-CBC w/Diff [[id number]] 2)  T-Comprehensive Metabolic Panel [[id number]] 3)  T-Amylase [[id number]] 4)  T-Lipase [[id number]] 5)  T-CK MB Fract [[id number]] 6)  T-CK Isoenzymes [[id number]] 7)  T- * Misc. Laboratory test [99999] 8)  Est. Patient Level IV [40347] 9)  EKG w/ Interpretation [93000]

## 2010-11-16 NOTE — Progress Notes (Signed)
----   Converted from flag ---- ---- 11/10/2010 1:26 PM, Nani Gasser MD wrote: patient and go ahead and have her start either Prilosec or Prevacid over the counter.  Recommend she take one twice a day about 20 minutes before breakfast and then 20 minutes before dinner.  If she does have reflux for ulcers and this will help her symptoms.  And we will call her back as soon as we get the results of her lab work. ------------------------------  11/10/10 1:36 acm pt notified

## 2010-12-07 ENCOUNTER — Other Ambulatory Visit: Payer: Self-pay | Admitting: *Deleted

## 2010-12-07 DIAGNOSIS — F988 Other specified behavioral and emotional disorders with onset usually occurring in childhood and adolescence: Secondary | ICD-10-CM

## 2010-12-07 MED ORDER — AMPHETAMINE-DEXTROAMPHET ER 25 MG PO CP24: 25.0000 mg | ORAL_CAPSULE | ORAL | Status: DC

## 2010-12-20 ENCOUNTER — Other Ambulatory Visit: Payer: Self-pay | Admitting: Obstetrics and Gynecology

## 2010-12-20 DIAGNOSIS — Z1231 Encounter for screening mammogram for malignant neoplasm of breast: Secondary | ICD-10-CM

## 2011-01-06 ENCOUNTER — Other Ambulatory Visit: Payer: Self-pay | Admitting: *Deleted

## 2011-01-06 DIAGNOSIS — F988 Other specified behavioral and emotional disorders with onset usually occurring in childhood and adolescence: Secondary | ICD-10-CM

## 2011-01-06 MED ORDER — AMPHETAMINE-DEXTROAMPHET ER 25 MG PO CP24: 25.0000 mg | ORAL_CAPSULE | ORAL | Status: DC

## 2011-01-10 ENCOUNTER — Ambulatory Visit: Payer: BC Managed Care – PPO | Attending: Orthopedic Surgery | Admitting: Physical Therapy

## 2011-01-10 DIAGNOSIS — R262 Difficulty in walking, not elsewhere classified: Secondary | ICD-10-CM | POA: Insufficient documentation

## 2011-01-10 DIAGNOSIS — IMO0001 Reserved for inherently not codable concepts without codable children: Secondary | ICD-10-CM | POA: Insufficient documentation

## 2011-01-10 DIAGNOSIS — M25559 Pain in unspecified hip: Secondary | ICD-10-CM | POA: Insufficient documentation

## 2011-01-10 DIAGNOSIS — M6281 Muscle weakness (generalized): Secondary | ICD-10-CM | POA: Insufficient documentation

## 2011-01-17 ENCOUNTER — Ambulatory Visit: Payer: BC Managed Care – PPO | Admitting: Physical Therapy

## 2011-01-18 ENCOUNTER — Ambulatory Visit
Admission: RE | Admit: 2011-01-18 | Discharge: 2011-01-18 | Disposition: A | Discharge status: Home / Self Care | Payer: BC Managed Care – PPO | Source: Ambulatory Visit | Attending: Obstetrics and Gynecology | Admitting: Obstetrics and Gynecology

## 2011-01-18 DIAGNOSIS — Z1231 Encounter for screening mammogram for malignant neoplasm of breast: Secondary | ICD-10-CM

## 2011-01-20 ENCOUNTER — Ambulatory Visit: Payer: BC Managed Care – PPO | Attending: Orthopedic Surgery | Admitting: Physical Therapy

## 2011-01-20 ENCOUNTER — Other Ambulatory Visit: Payer: Self-pay | Admitting: Obstetrics and Gynecology

## 2011-01-20 DIAGNOSIS — M25559 Pain in unspecified hip: Secondary | ICD-10-CM | POA: Insufficient documentation

## 2011-01-20 DIAGNOSIS — IMO0001 Reserved for inherently not codable concepts without codable children: Secondary | ICD-10-CM | POA: Insufficient documentation

## 2011-01-20 DIAGNOSIS — M6281 Muscle weakness (generalized): Secondary | ICD-10-CM | POA: Insufficient documentation

## 2011-01-20 DIAGNOSIS — R262 Difficulty in walking, not elsewhere classified: Secondary | ICD-10-CM | POA: Insufficient documentation

## 2011-01-20 DIAGNOSIS — R928 Other abnormal and inconclusive findings on diagnostic imaging of breast: Secondary | ICD-10-CM

## 2011-01-24 ENCOUNTER — Ambulatory Visit: Payer: BC Managed Care – PPO | Admitting: Physical Therapy

## 2011-01-26 ENCOUNTER — Other Ambulatory Visit: Payer: BC Managed Care – PPO

## 2011-01-27 ENCOUNTER — Ambulatory Visit: Payer: BC Managed Care – PPO | Admitting: Physical Therapy

## 2011-01-28 ENCOUNTER — Ambulatory Visit: Payer: BC Managed Care – PPO

## 2011-01-28 ENCOUNTER — Ambulatory Visit
Admission: RE | Admit: 2011-01-28 | Discharge: 2011-01-28 | Disposition: A | Discharge status: Home / Self Care | Payer: BC Managed Care – PPO | Source: Ambulatory Visit | Attending: Obstetrics and Gynecology | Admitting: Obstetrics and Gynecology

## 2011-01-28 DIAGNOSIS — R928 Other abnormal and inconclusive findings on diagnostic imaging of breast: Secondary | ICD-10-CM

## 2011-01-31 ENCOUNTER — Ambulatory Visit: Payer: BC Managed Care – PPO | Admitting: Physical Therapy

## 2011-02-03 ENCOUNTER — Ambulatory Visit: Payer: BC Managed Care – PPO | Admitting: Physical Therapy

## 2011-02-07 ENCOUNTER — Other Ambulatory Visit: Payer: Self-pay | Admitting: *Deleted

## 2011-02-07 ENCOUNTER — Ambulatory Visit: Payer: BC Managed Care – PPO | Admitting: Physical Therapy

## 2011-02-07 DIAGNOSIS — F988 Other specified behavioral and emotional disorders with onset usually occurring in childhood and adolescence: Secondary | ICD-10-CM

## 2011-02-07 MED ORDER — AMPHETAMINE-DEXTROAMPHET ER 25 MG PO CP24: 25.0000 mg | ORAL_CAPSULE | ORAL | Status: DC

## 2011-02-10 ENCOUNTER — Ambulatory Visit: Payer: BC Managed Care – PPO | Admitting: Physical Therapy

## 2011-03-09 ENCOUNTER — Other Ambulatory Visit: Payer: Self-pay | Admitting: Family Medicine

## 2011-03-09 DIAGNOSIS — F909 Attention-deficit hyperactivity disorder, unspecified type: Secondary | ICD-10-CM

## 2011-03-09 DIAGNOSIS — F988 Other specified behavioral and emotional disorders with onset usually occurring in childhood and adolescence: Secondary | ICD-10-CM

## 2011-03-09 MED ORDER — AMPHETAMINE-DEXTROAMPHET ER 25 MG PO CP24: 25.0000 mg | ORAL_CAPSULE | ORAL | Status: DC

## 2011-03-09 NOTE — Telephone Encounter (Addendum)
Pt called and wants a refill of her adderall XR 25 mg PO QD. Plan: Pt notified needs office visit follow up for ADD.  Scheduled appt for 03-21-11 @ 11:15am. Printed prescription for provider to sign.  A 30 day supply of the adderall was given and pt understands she must keep her fup appt.

## 2011-03-10 ENCOUNTER — Other Ambulatory Visit: Payer: Self-pay | Admitting: Family Medicine

## 2011-03-10 NOTE — Telephone Encounter (Signed)
Pt called and needs a refill of adderall.  Last prescribed on 03-09-11.   Plan:  Pt notified that the script has been printed off since 03-09-11, and she may pick up at the office. Jarvis Newcomer, LPN Domingo Dimes

## 2011-03-18 ENCOUNTER — Encounter: Payer: Self-pay | Admitting: Family Medicine

## 2011-03-21 ENCOUNTER — Ambulatory Visit (INDEPENDENT_AMBULATORY_CARE_PROVIDER_SITE_OTHER): Payer: BC Managed Care – PPO | Admitting: Family Medicine

## 2011-03-21 ENCOUNTER — Encounter: Payer: Self-pay | Admitting: Family Medicine

## 2011-03-21 VITALS — BP 102/69 | HR 87 | Ht 61.0 in | Wt 132.0 lb

## 2011-03-21 DIAGNOSIS — E785 Hyperlipidemia, unspecified: Secondary | ICD-10-CM

## 2011-03-21 DIAGNOSIS — F988 Other specified behavioral and emotional disorders with onset usually occurring in childhood and adolescence: Secondary | ICD-10-CM

## 2011-03-21 MED ORDER — AMPHETAMINE-DEXTROAMPHET ER 25 MG PO CP24: 25.0000 mg | ORAL_CAPSULE | ORAL | Status: DC

## 2011-03-21 NOTE — Assessment & Plan Note (Signed)
I recommend she actually take her niacin and her Fish oil to help reduce her TG. She can have it rechecked here or at her OB office in Nov. I don't know what her LDL is.

## 2011-03-21 NOTE — Assessment & Plan Note (Signed)
Doing well on current regimen. Given refill. F/U in 1 yr.

## 2011-03-21 NOTE — Progress Notes (Signed)
  Subjective:    Patient ID: Wanda Gonzales, female    DOB: 04/02/1956, 55 y.o.   MRN: [id number]  HPI  Her heart has "settled" down.    Seeing ortho.  Has bursitis in both hips and is doing PT.  Her hip replacement if fine.   Her gyn took her off the OCP about 2 months ago.  They tested her hormones and she is not post menopausal yet  She is taking niacin instead of fish oil for her TG.  Her TG were high at her OB office. She is due to have them recheck in Nov.   The adderral is doing well. Doesn't use it every day.Says her current dose works really well for her. No CP, palps, or SOB.   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    Review of Systems     Objective:   Physical Exam  Constitutional: She is oriented to person, place, and time. She appears well-developed and well-nourished.  HENT:  Head: Normocephalic and atraumatic.  Neck: Neck supple. No thyromegaly present.  Cardiovascular: Normal rate, regular rhythm and normal heart sounds.   Pulmonary/Chest: Effort normal and breath sounds normal.  Lymphadenopathy:    She has no cervical adenopathy.  Neurological: She is alert and oriented to person, place, and time.  Skin: Skin is warm and dry.  Psychiatric: She has a normal mood and affect. Her behavior is normal.          Assessment & Plan:

## 2011-05-07 ENCOUNTER — Other Ambulatory Visit: Payer: Self-pay | Admitting: Family Medicine

## 2011-05-16 ENCOUNTER — Other Ambulatory Visit: Payer: Self-pay | Admitting: Family Medicine

## 2011-05-16 MED ORDER — AMPHETAMINE-DEXTROAMPHET ER 25 MG PO CP24: 25.0000 mg | ORAL_CAPSULE | ORAL | Status: DC

## 2011-05-16 NOTE — Telephone Encounter (Signed)
Pt called and requested refill for her adderall XR 25 mg PO QD. Plan:  Medication refilled.  Recent office visit on 03-21-11 for ADD.  Tried to call the patient , but NA after several rings.  No voice mail picked up in order to LM, however; pt had already said on message that she was going to pup script today.   Jarvis Newcomer, LPN Domingo Dimes

## 2011-06-16 ENCOUNTER — Other Ambulatory Visit: Payer: Self-pay | Admitting: Family Medicine

## 2011-06-16 MED ORDER — AMPHETAMINE-DEXTROAMPHET ER 25 MG PO CP24: 25.0000 mg | ORAL_CAPSULE | ORAL | Status: DC

## 2011-06-16 NOTE — Telephone Encounter (Signed)
Pt requesting refill of adderall. PLan:  Reviewed the chart file and UTD with office visit.  Refilled adderall for 30 day supply.  Tried to call the pt but no answer.  Placed prescription up front for the pt to pup. Jarvis Newcomer, LPN Domingo Dimes

## 2011-07-21 ENCOUNTER — Other Ambulatory Visit: Payer: Self-pay | Admitting: Family Medicine

## 2011-07-21 MED ORDER — AMPHETAMINE-DEXTROAMPHET ER 25 MG PO CP24: 25.0000 mg | ORAL_CAPSULE | ORAL | Status: DC

## 2011-07-21 NOTE — Telephone Encounter (Signed)
Pt called for refill of her adderall XR 25 mg PO Qam. Plan:  Refilled # 30/0 refills.  Pt will pup this afternoon around 4:45pm. Jarvis Newcomer, LPN Domingo Dimes

## 2011-08-19 ENCOUNTER — Other Ambulatory Visit: Payer: Self-pay | Admitting: *Deleted

## 2011-08-19 MED ORDER — AMPHETAMINE-DEXTROAMPHET ER 25 MG PO CP24: 25.0000 mg | ORAL_CAPSULE | ORAL | Status: DC

## 2011-09-21 ENCOUNTER — Other Ambulatory Visit: Payer: Self-pay | Admitting: *Deleted

## 2011-09-21 MED ORDER — AMPHETAMINE-DEXTROAMPHET ER 25 MG PO CP24: 25.0000 mg | ORAL_CAPSULE | ORAL | Status: DC

## 2011-10-20 ENCOUNTER — Other Ambulatory Visit: Payer: Self-pay | Admitting: *Deleted

## 2011-10-20 MED ORDER — AMPHETAMINE-DEXTROAMPHET ER 25 MG PO CP24: 25.0000 mg | ORAL_CAPSULE | ORAL | Status: DC

## 2011-10-27 ENCOUNTER — Other Ambulatory Visit: Payer: Self-pay | Admitting: Orthopedic Surgery

## 2011-11-21 ENCOUNTER — Other Ambulatory Visit: Payer: Self-pay | Admitting: *Deleted

## 2011-11-21 MED ORDER — AMPHETAMINE-DEXTROAMPHET ER 25 MG PO CP24: 25.0000 mg | ORAL_CAPSULE | ORAL | Status: DC

## 2011-12-21 ENCOUNTER — Other Ambulatory Visit: Payer: Self-pay | Admitting: *Deleted

## 2011-12-21 MED ORDER — AMPHETAMINE-DEXTROAMPHET ER 25 MG PO CP24: 25.0000 mg | ORAL_CAPSULE | ORAL | Status: DC

## 2011-12-28 ENCOUNTER — Ambulatory Visit (INDEPENDENT_AMBULATORY_CARE_PROVIDER_SITE_OTHER): Payer: BC Managed Care – PPO | Admitting: Physician Assistant

## 2011-12-28 ENCOUNTER — Encounter: Payer: Self-pay | Admitting: Physician Assistant

## 2011-12-28 VITALS — BP 101/64 | HR 101 | Ht 61.0 in | Wt 135.0 lb

## 2011-12-28 DIAGNOSIS — M6740 Ganglion, unspecified site: Secondary | ICD-10-CM

## 2011-12-28 DIAGNOSIS — F988 Other specified behavioral and emotional disorders with onset usually occurring in childhood and adolescence: Secondary | ICD-10-CM

## 2011-12-28 DIAGNOSIS — M6780 Other specified disorders of synovium and tendon, unspecified site: Secondary | ICD-10-CM

## 2011-12-28 NOTE — Patient Instructions (Addendum)
Tylenol extra strenth 1000mg  up to three times a day for pain. Use ice on affected area of hand. Get ace bandage and wrap hand and follow up with Ortho in 2 weeks. Suspect Tendon cyst gave handout for ganglion cyst due to similar treatment.  Ganglion A ganglion is a swelling under the skin that is filled with a thick, jelly-like substance. It is a synovial cyst. This is caused by a break (rupture) of the joint lining from the joint space. A ganglion often occurs near an area of repeated minor trauma (damage caused by an accident). Trauma may also be a repetitive movement at work or in a sport. TREATMENT  It often goes away without treatment. It may reappear later. Sometimes a ganglion may need to be surgically removed. Often they are drained and injected with a steroid. Sometimes they respond to:  Rest.   Splinting.  HOME CARE INSTRUCTIONS   Your caregiver will decide the best way of treating your ganglion. Do not try to break the ganglion yourself by pressing on it, poking it with a needle, or hitting it with a heavy object.   Use medications as directed.  SEEK MEDICAL CARE IF:   The ganglion becomes larger or more painful.   You have increased redness or swelling.   You have weakness or numbness in your hand or wrist.  MAKE SURE YOU:   Understand these instructions.   Will watch your condition.   Will get help right away if you are not doing well or get worse.  Document Released: 09/02/2000 Document Revised: 08/25/2011 Document Reviewed: 10/30/2007 [facility] Patient Information 2012 [place], [place].

## 2011-12-28 NOTE — Progress Notes (Signed)
  Subjective:    Patient ID: Wanda Gonzales, female    DOB: Jul 31, 1956, 56 y.o.   MRN: [id number]  HPI Patient presents with with 1 and 1/2 weeks of left palm pain with a knot. She is right hand dominant. She does not remember any trauma and has not done anything different in the past month. She does work out and [employer] but doesn't remember any correlation to knot or pain. She reports the pain to be 4/10 with no pressure and 6/10 with pressure put over small knot. She denies any numbness or tingling. She still has complete function of left hand and has not noticed any strength changes. She has never had anything like this before. She has had ganglion cyst. She takes Mobic daily but other than that has not tried anything. She has a lot of problems with her left arm and ortho is working with her to resolve these problems. She called ortho and they cannot see her for 2 weeks.   She also wanted to be seen for her ADD visit. She is doing well and very controlled. She denies any chest pain, appetite changes, palpitations or problems with medications. She does not need refill at this time but will need in the next month.      Review of Systems     Objective:   Physical Exam  Constitutional: She is oriented to person, place, and time. She appears well-developed and well-nourished.  HENT:  Head: Normocephalic and atraumatic.  Cardiovascular: Normal rate, regular rhythm, normal heart sounds and intact distal pulses.        Rechecked pulse at 94.  Pulmonary/Chest: Effort normal and breath sounds normal. She has no wheezes.  Musculoskeletal:       Phalen's sign- No numbness or tingling but more pain towards while in position on side of pinky. Negative Tinels sign. Normal ROM of left hand in all directions. NO bony tenderness over joints. Strength of fingers with abduction and adduction were 5/5. Thumb adduction was a little weaker than right at 4/5. Thenar eminence with no atropy or  bruising. Small knot the size of a BB pellet and same consistincy was palpated on left palm at the crease of thenar eminence with increased vascularity. No other site of pain was able to be illicited.  Neurological: She is alert and oriented to person, place, and time.  Skin: Skin is warm and dry.  Psychiatric: She has a normal mood and affect. Her behavior is normal.          Assessment & Plan:  Tendon cysts-Tylenol extra strenth 1000mg  up to three times a day for pain. Use ice on affected area of hand. Get ace bandage and wrap hand and follow up with Ortho in 2 weeks. Suspect Tendon cyst gave handout for ganglion cyst due to similar treatment.   ADD- Patient doesn't need refills today. She is well-controlled at current dose and can refill when needed.

## 2012-01-05 LAB — HM PAP SMEAR: HM Pap smear: NEGATIVE

## 2012-01-13 ENCOUNTER — Other Ambulatory Visit: Payer: Self-pay | Admitting: Orthopedic Surgery

## 2012-01-13 ENCOUNTER — Other Ambulatory Visit: Payer: Self-pay | Admitting: Obstetrics and Gynecology

## 2012-01-13 ENCOUNTER — Ambulatory Visit
Admission: RE | Admit: 2012-01-13 | Discharge: 2012-01-13 | Disposition: A | Discharge status: Home / Self Care | Payer: BC Managed Care – PPO | Source: Ambulatory Visit | Attending: Orthopedic Surgery | Admitting: Orthopedic Surgery

## 2012-01-13 DIAGNOSIS — Z1231 Encounter for screening mammogram for malignant neoplasm of breast: Secondary | ICD-10-CM

## 2012-01-13 DIAGNOSIS — M79643 Pain in unspecified hand: Secondary | ICD-10-CM

## 2012-01-18 HISTORY — PX: OTHER SURGICAL HISTORY: SHX169

## 2012-01-23 ENCOUNTER — Other Ambulatory Visit: Payer: Self-pay | Admitting: *Deleted

## 2012-01-23 MED ORDER — AMPHETAMINE-DEXTROAMPHET ER 25 MG PO CP24: 25.0000 mg | ORAL_CAPSULE | ORAL | Status: DC

## 2012-01-31 ENCOUNTER — Inpatient Hospital Stay: Admission: RE | Admit: 2012-01-31 | Payer: BC Managed Care – PPO | Source: Ambulatory Visit

## 2012-01-31 ENCOUNTER — Other Ambulatory Visit: Payer: Self-pay | Admitting: Obstetrics and Gynecology

## 2012-01-31 ENCOUNTER — Ambulatory Visit
Admission: RE | Admit: 2012-01-31 | Discharge: 2012-01-31 | Disposition: A | Discharge status: Home / Self Care | Payer: BC Managed Care – PPO | Source: Ambulatory Visit | Attending: Obstetrics and Gynecology | Admitting: Obstetrics and Gynecology

## 2012-01-31 DIAGNOSIS — Z1231 Encounter for screening mammogram for malignant neoplasm of breast: Secondary | ICD-10-CM

## 2012-02-02 ENCOUNTER — Other Ambulatory Visit: Payer: Self-pay | Admitting: Obstetrics and Gynecology

## 2012-02-02 DIAGNOSIS — R928 Other abnormal and inconclusive findings on diagnostic imaging of breast: Secondary | ICD-10-CM

## 2012-02-08 ENCOUNTER — Other Ambulatory Visit: Payer: Self-pay | Admitting: Obstetrics and Gynecology

## 2012-02-08 ENCOUNTER — Ambulatory Visit
Admission: RE | Admit: 2012-02-08 | Discharge: 2012-02-08 | Disposition: A | Discharge status: Home / Self Care | Payer: BC Managed Care – PPO | Source: Ambulatory Visit | Attending: Obstetrics and Gynecology | Admitting: Obstetrics and Gynecology

## 2012-02-08 DIAGNOSIS — R928 Other abnormal and inconclusive findings on diagnostic imaging of breast: Secondary | ICD-10-CM

## 2012-02-08 DIAGNOSIS — R921 Mammographic calcification found on diagnostic imaging of breast: Secondary | ICD-10-CM

## 2012-02-10 ENCOUNTER — Other Ambulatory Visit: Payer: Self-pay | Admitting: Diagnostic Radiology

## 2012-02-10 ENCOUNTER — Ambulatory Visit
Admission: RE | Admit: 2012-02-10 | Discharge: 2012-02-10 | Disposition: A | Discharge status: Home / Self Care | Payer: BC Managed Care – PPO | Source: Ambulatory Visit | Attending: Obstetrics and Gynecology | Admitting: Obstetrics and Gynecology

## 2012-02-10 DIAGNOSIS — R921 Mammographic calcification found on diagnostic imaging of breast: Secondary | ICD-10-CM

## 2012-02-10 DIAGNOSIS — R928 Other abnormal and inconclusive findings on diagnostic imaging of breast: Secondary | ICD-10-CM

## 2012-02-14 HISTORY — PX: BREAST BIOPSY: SHX20

## 2012-02-20 ENCOUNTER — Other Ambulatory Visit: Payer: Self-pay | Admitting: Orthopedic Surgery

## 2012-02-20 NOTE — Progress Notes (Signed)
Preoperative surgical orders have been place into the Epic hospital system for [facility] on 02/20/2012, 8:07 AM  by Patrica Duel for surgery on 02/20/2012.  Preop Hip orders including Experel Injecion, IV Tylenol, and IV Decadron as long as there are no contraindications to the above medications.  These orders were originally entered on 10/27/2011 but due to a default 90 day rule, the orders were deleted automatically by the EPIC system requiring them to be reentered again.  Avel Peace, PA-C

## 2012-02-23 ENCOUNTER — Encounter (HOSPITAL_COMMUNITY)
Admission: RE | Admit: 2012-02-23 | Discharge: 2012-02-23 | Disposition: A | Discharge status: Home / Self Care | Payer: BC Managed Care – PPO | Source: Ambulatory Visit | Attending: Orthopedic Surgery | Admitting: Orthopedic Surgery

## 2012-02-23 ENCOUNTER — Encounter (HOSPITAL_COMMUNITY): Payer: Self-pay

## 2012-02-23 ENCOUNTER — Encounter (HOSPITAL_COMMUNITY): Payer: Self-pay | Admitting: Pharmacy Technician

## 2012-02-23 DIAGNOSIS — M1990 Unspecified osteoarthritis, unspecified site: Secondary | ICD-10-CM

## 2012-02-23 DIAGNOSIS — F988 Other specified behavioral and emotional disorders with onset usually occurring in childhood and adolescence: Secondary | ICD-10-CM

## 2012-02-23 DIAGNOSIS — R112 Nausea with vomiting, unspecified: Secondary | ICD-10-CM

## 2012-02-23 DIAGNOSIS — R0982 Postnasal drip: Secondary | ICD-10-CM

## 2012-02-23 HISTORY — DX: Postnasal drip: R09.82

## 2012-02-23 HISTORY — DX: Other specified postprocedural states: R11.2

## 2012-02-23 HISTORY — PX: BREAST SURGERY: SHX581

## 2012-02-23 HISTORY — DX: Other specified behavioral and emotional disorders with onset usually occurring in childhood and adolescence: F98.8

## 2012-02-23 HISTORY — DX: Nausea with vomiting, unspecified: R11.2

## 2012-02-23 HISTORY — DX: Other specified postprocedural states: Z98.890

## 2012-02-23 HISTORY — DX: Unspecified osteoarthritis, unspecified site: M19.90

## 2012-02-23 HISTORY — PX: HAND SURGERY: SHX662

## 2012-02-23 LAB — HCG, SERUM, QUALITATIVE: Preg, Serum: NEGATIVE

## 2012-02-23 LAB — SURGICAL PCR SCREEN
MRSA, PCR: NEGATIVE
Staphylococcus aureus: POSITIVE — AB

## 2012-02-23 NOTE — Patient Instructions (Addendum)
20 Wanda Gonzales  02/23/2012   Your procedure is scheduled on:  6-6 -2013  Report to [facility] at     0815   AM .  Call this number if you have problems the morning of surgery: [phone]   Remember:   Do not eat food:After Midnight.    Take these medicines the morning of surgery with A SIP OF WATER: Prozac   Do not wear jewelry, make-up or nail polish.  Do not wear lotions, powders, or perfumes. You may wear deodorant.  Do not shave 48 hours prior to surgery.(face and neck okay, no shaving of legs)  Do not bring valuables to the hospital.  Contacts, dentures or bridgework may not be worn into surgery.  Leave suitcase in the car. After surgery it may be brought to your room.  For patients admitted to the hospital, checkout time is 11:00 AM the day of discharge.   Patients discharged the day of surgery will not be allowed to drive home.  Name and phone number of your driver: Estanislado Spire  Special Instructions: CHG Shower Use Special Wash: 1/2 bottle night before surgery and 1/2 bottle morning of surgery.(avoid face and genitals)   Please read over the following fact sheets that you were given: MRSA Information, Incentive Spirometry Instruction.

## 2012-02-23 NOTE — Pre-Procedure Instructions (Signed)
02-23-12 pt. notified per phone to fill Mupirocin Oint. And use as directed.

## 2012-02-24 ENCOUNTER — Other Ambulatory Visit: Payer: Self-pay | Admitting: *Deleted

## 2012-02-24 MED ORDER — AMPHETAMINE-DEXTROAMPHET ER 25 MG PO CP24: 25.0000 mg | ORAL_CAPSULE | ORAL | Status: DC

## 2012-02-28 NOTE — H&P (Signed)
CC- Wanda Gonzales is a 56 y.o. female who presents with right hip pain  Hip Pain: Patient complains of right hip pain. Onset of the symptoms was several years ago. Inciting event: none. Current symptoms include lateral pain worse with lying on right side. Aggravating symptoms: lateral movements, pivoting and rising after sitting. Patient's course of pain: gradually worsening. Patient has had no prior hip problems on this side.. Evaluation to date: plain films, which were normal and MRI with bursitis.  Treatment to date: physical therapy, which has been not very effective and injection-lateral and intra-articular with no long term benefits.  Past Medical History  Diagnosis Date  . Varicose veins   . PONV (postoperative nausea and vomiting) 02-23-12    always has PONV  . Post-nasal drainage 02-23-12    frequent a problem,causes a cough and mucus buildup in throat  . ADD (attention deficit disorder) 02-23-12    easily distracted.  . Venous insufficiency     chronic-denies any problems  . Arthritis 02-23-12    Rt. hip and lt. arm    Past Surgical History  Procedure Date  . 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 Breast surgery 02-23-12    Rt. needle biopsy, recent -negative  . Hand surgery 02-23-12    "blood clot" excised palm left hand 01-18-12    Prior to Admission medications   Medication Sig Start Date End Date Taking? Authorizing Provider  amphetamine-dextroamphetamine (ADDERALL XR) 25 MG 24 hr capsule Take 1 capsule (25 mg total) by mouth every morning. 02/24/12   Agapito Games, MD  amphetamine-dextroamphetamine (ADDERALL XR, 25MG ,) 25 MG 24 hr capsule Take 1 capsule (25 mg total) by mouth every morning. 01/06/11 02/05/11  Agapito Games, MD  amphetamine-dextroamphetamine (ADDERALL XR, 25MG ,) 25 MG 24 hr capsule Take 1 capsule (25 mg total) by mouth every morning. 03/21/11 04/20/11  Agapito Games, MD  Ascorbic Acid (VITAMIN C)  250 MG CHEW Chew 1 tablet by mouth daily with breakfast. gummie.    Historical Provider, MD  fish oil-omega-3 fatty acids 1000 MG capsule Take 4 g by mouth daily.     Historical Provider, MD  FLUoxetine (PROZAC) 10 MG tablet Take 10 mg by mouth at bedtime.     Historical Provider, MD  Multiple Vitamin (MULTIVITAMIN) tablet Take 1 tablet by mouth daily.      Historical Provider, MD  niacin (NIASPAN) 1000 MG CR tablet Take 1,000 mg by mouth at bedtime.      Historical Provider, MD  OVER THE COUNTER MEDICATION Take 2 tablets by mouth daily with breakfast. Vitamin D gummie    Historical Provider, MD    Physical Examination: General appearance - alert, well appearing, and in no distress Mental status - alert, oriented to person, place, and time Chest - clear to auscultation, no wheezes, rales or rhonchi, symmetric air entry Heart - normal rate, regular rhythm, normal S1, S2, no murmurs, rubs, clicks or gallops Abdomen - soft, nontender, nondistended, no masses or organomegaly  A right hip exam was performed. TENDERNESS: maximal at greater trochanter and slightly posterior ROM: normal STRENGTH: normal  ASSESSMENT: Right hip intractable bursitis with possible gluteal tendon tear- Plan right hip bursectomy with possible tendon repiair. Discussed procedure risks and potential comps as well as rehab course and she elects to proceed. Goals are decreased ain and improved  Function, both of which should be acheived  [employer] V. Prathik Aman, MD  02/28/2012, 10:42 PM

## 2012-02-29 ENCOUNTER — Encounter (HOSPITAL_COMMUNITY): Payer: Self-pay

## 2012-02-29 ENCOUNTER — Encounter (HOSPITAL_COMMUNITY): Payer: Self-pay | Admitting: *Deleted

## 2012-02-29 ENCOUNTER — Encounter (HOSPITAL_COMMUNITY): Payer: Self-pay | Admitting: Anesthesiology

## 2012-02-29 ENCOUNTER — Encounter (HOSPITAL_COMMUNITY)
Admission: RE | Disposition: A | Discharge status: Home / Self Care | Payer: Self-pay | Source: Ambulatory Visit | Attending: Orthopedic Surgery

## 2012-02-29 ENCOUNTER — Ambulatory Visit (HOSPITAL_COMMUNITY)
Admission: RE | Admit: 2012-02-29 | Discharge: 2012-02-29 | Disposition: A | Discharge status: Home / Self Care | Payer: BC Managed Care – PPO | Source: Ambulatory Visit | Attending: Orthopedic Surgery | Admitting: Orthopedic Surgery

## 2012-02-29 ENCOUNTER — Ambulatory Visit (HOSPITAL_COMMUNITY): Payer: BC Managed Care – PPO | Admitting: Anesthesiology

## 2012-02-29 DIAGNOSIS — M7061 Trochanteric bursitis, right hip: Secondary | ICD-10-CM | POA: Diagnosis present

## 2012-02-29 DIAGNOSIS — Z79899 Other long term (current) drug therapy: Secondary | ICD-10-CM | POA: Insufficient documentation

## 2012-02-29 DIAGNOSIS — Z96649 Presence of unspecified artificial hip joint: Secondary | ICD-10-CM | POA: Insufficient documentation

## 2012-02-29 DIAGNOSIS — Z01812 Encounter for preprocedural laboratory examination: Secondary | ICD-10-CM | POA: Insufficient documentation

## 2012-02-29 DIAGNOSIS — M6688 Spontaneous rupture of other tendons, other: Secondary | ICD-10-CM | POA: Insufficient documentation

## 2012-02-29 DIAGNOSIS — M76899 Other specified enthesopathies of unspecified lower limb, excluding foot: Secondary | ICD-10-CM | POA: Insufficient documentation

## 2012-02-29 HISTORY — PX: EXCISION/RELEASE BURSA HIP: SHX5014

## 2012-02-29 SURGERY — EXCISION BURSA WITH ILIAL-TIBIAL BAND RELEASE
Anesthesia: Spinal | Site: Hip | Laterality: Right | Wound class: Clean

## 2012-02-29 MED ORDER — PHENYLEPHRINE HCL 10 MG/ML IJ SOLN
INTRAMUSCULAR | Status: DC | PRN
  Administered 2012-02-29: 80 ug via INTRAVENOUS
  Administered 2012-02-29 (×2): 40 ug via INTRAVENOUS
  Administered 2012-02-29: 80 ug via INTRAVENOUS
  Administered 2012-02-29: 20 ug via INTRAVENOUS
  Administered 2012-02-29: 80 ug via INTRAVENOUS
  Administered 2012-02-29: 20 ug via INTRAVENOUS

## 2012-02-29 MED ORDER — EPHEDRINE SULFATE 50 MG/ML IJ SOLN
INTRAMUSCULAR | Status: DC | PRN
  Administered 2012-02-29: 5 mg via INTRAVENOUS

## 2012-02-29 MED ORDER — LACTATED RINGERS IV SOLN
INTRAVENOUS | Status: DC
  Administered 2012-02-29: 1000 mL via INTRAVENOUS

## 2012-02-29 MED ORDER — MIDAZOLAM HCL 5 MG/5ML IJ SOLN
INTRAMUSCULAR | Status: DC | PRN
  Administered 2012-02-29: 2 mg via INTRAVENOUS

## 2012-02-29 MED ORDER — 0.9 % SODIUM CHLORIDE (POUR BTL) OPTIME
TOPICAL | Status: DC | PRN
  Administered 2012-02-29: 1000 mL

## 2012-02-29 MED ORDER — CHLORHEXIDINE GLUCONATE 4 % EX LIQD
60.0000 mL | Freq: Once | CUTANEOUS | Status: DC
  Filled 2012-02-29: qty 60

## 2012-02-29 MED ORDER — SCOPOLAMINE 1 MG/3DAYS TD PT72
1.0000 | MEDICATED_PATCH | Freq: Once | TRANSDERMAL | Status: DC
  Administered 2012-02-29: 1.5 mg via TRANSDERMAL
  Filled 2012-02-29: qty 1

## 2012-02-29 MED ORDER — ONDANSETRON HCL 4 MG/2ML IJ SOLN
INTRAMUSCULAR | Status: DC | PRN
  Administered 2012-02-29: 4 mg via INTRAVENOUS

## 2012-02-29 MED ORDER — LIDOCAINE HCL (CARDIAC) 20 MG/ML IV SOLN
INTRAVENOUS | Status: DC | PRN
  Administered 2012-02-29: 50 mg via INTRAVENOUS

## 2012-02-29 MED ORDER — BUPIVACAINE HCL 0.75 % IJ SOLN
INTRAMUSCULAR | Status: DC | PRN
  Administered 2012-02-29: 15 mg via INTRATHECAL

## 2012-02-29 MED ORDER — DEXAMETHASONE SODIUM PHOSPHATE 10 MG/ML IJ SOLN
INTRAMUSCULAR | Status: DC | PRN
  Administered 2012-02-29: 10 mg via INTRAVENOUS

## 2012-02-29 MED ORDER — CEFAZOLIN SODIUM-DEXTROSE 2-3 GM-% IV SOLR
2.0000 g | INTRAVENOUS | Status: AC
  Administered 2012-02-29: 2 g via INTRAVENOUS

## 2012-02-29 MED ORDER — PROPOFOL 10 MG/ML IV EMUL
INTRAVENOUS | Status: DC | PRN
  Administered 2012-02-29: 200 ug/kg/min via INTRAVENOUS

## 2012-02-29 MED ORDER — BUPIVACAINE LIPOSOME 1.3 % IJ SUSP
INTRAMUSCULAR | Status: DC | PRN
  Administered 2012-02-29: 20 mL

## 2012-02-29 MED ORDER — ACETAMINOPHEN 10 MG/ML IV SOLN
INTRAVENOUS | Status: DC | PRN
  Administered 2012-02-29: 1000 mg via INTRAVENOUS

## 2012-02-29 MED ORDER — DIPHENHYDRAMINE HCL 50 MG/ML IJ SOLN
INTRAMUSCULAR | Status: DC | PRN
  Administered 2012-02-29: 12.5 mg via INTRAVENOUS

## 2012-02-29 MED ORDER — LACTATED RINGERS IV SOLN: INTRAVENOUS | Status: DC

## 2012-02-29 MED ORDER — METHOCARBAMOL 500 MG PO TABS: 500.0000 mg | ORAL_TABLET | Freq: Four times a day (QID) | ORAL | Status: AC

## 2012-02-29 MED ORDER — HYDROCODONE-ACETAMINOPHEN 5-325 MG PO TABS: 1.0000 | ORAL_TABLET | Freq: Four times a day (QID) | ORAL | Status: AC | PRN

## 2012-02-29 MED ORDER — PROPOFOL 10 MG/ML IV EMUL: INTRAVENOUS | Status: DC | PRN

## 2012-02-29 MED ORDER — FENTANYL CITRATE 0.05 MG/ML IJ SOLN: 25.0000 ug | INTRAMUSCULAR | Status: DC | PRN

## 2012-02-29 MED ORDER — FENTANYL CITRATE 0.05 MG/ML IJ SOLN
INTRAMUSCULAR | Status: DC | PRN
  Administered 2012-02-29 (×4): 25 ug via INTRAVENOUS

## 2012-02-29 MED ORDER — ACETAMINOPHEN 10 MG/ML IV SOLN
INTRAVENOUS | Status: AC
  Filled 2012-02-29: qty 100

## 2012-02-29 MED ORDER — ACETAMINOPHEN 10 MG/ML IV SOLN: 1000.0000 mg | Freq: Once | INTRAVENOUS | Status: DC

## 2012-02-29 MED ORDER — BUPIVACAINE LIPOSOME 1.3 % IJ SUSP
20.0000 mL | Freq: Once | INTRAMUSCULAR | Status: DC
  Filled 2012-02-29: qty 20

## 2012-02-29 MED ORDER — SCOPOLAMINE 1 MG/3DAYS TD PT72
MEDICATED_PATCH | TRANSDERMAL | Status: AC
  Filled 2012-02-29: qty 1

## 2012-02-29 MED ORDER — CEFAZOLIN SODIUM-DEXTROSE 2-3 GM-% IV SOLR
INTRAVENOUS | Status: AC
  Filled 2012-02-29: qty 50

## 2012-02-29 MED ORDER — KETAMINE HCL 10 MG/ML IJ SOLN
INTRAMUSCULAR | Status: DC | PRN
  Administered 2012-02-29: 25 mg via INTRAVENOUS

## 2012-02-29 MED ORDER — TRAMADOL HCL 50 MG PO TABS: 50.0000 mg | ORAL_TABLET | Freq: Four times a day (QID) | ORAL | Status: AC | PRN

## 2012-02-29 MED ORDER — LACTATED RINGERS IV SOLN
INTRAVENOUS | Status: DC | PRN
  Administered 2012-02-29 (×3): via INTRAVENOUS

## 2012-02-29 MED ORDER — DEXAMETHASONE SODIUM PHOSPHATE 10 MG/ML IJ SOLN: 10.0000 mg | Freq: Once | INTRAMUSCULAR | Status: DC

## 2012-02-29 MED ORDER — SODIUM CHLORIDE 0.9 % IV SOLN: INTRAVENOUS | Status: DC

## 2012-02-29 SURGICAL SUPPLY — 37 items
ANCHOR SUPER QUICK (Anchor) ×4 IMPLANT
BAG ZIPLOCK 12X15 (MISCELLANEOUS) ×2 IMPLANT
BLADE EXTENDED COATED 6.5IN (ELECTRODE) IMPLANT
CLOTH BEACON ORANGE TIMEOUT ST (SAFETY) ×2 IMPLANT
CLSR STERI-STRIP ANTIMIC 1/2X4 (GAUZE/BANDAGES/DRESSINGS) ×2 IMPLANT
DRAPE INCISE IOBAN 66X45 STRL (DRAPES) ×2 IMPLANT
DRAPE ORTHO SPLIT 77X108 STRL (DRAPES) ×2
DRAPE POUCH INSTRU U-SHP 10X18 (DRAPES) ×2 IMPLANT
DRAPE SURG ORHT 6 SPLT 77X108 (DRAPES) ×2 IMPLANT
DRAPE U-SHAPE 47X51 STRL (DRAPES) ×2 IMPLANT
DRSG ADAPTIC 3X8 NADH LF (GAUZE/BANDAGES/DRESSINGS) ×2 IMPLANT
DRSG MEPILEX BORDER 4X4 (GAUZE/BANDAGES/DRESSINGS) IMPLANT
DRSG MEPILEX BORDER 4X8 (GAUZE/BANDAGES/DRESSINGS) ×2 IMPLANT
ELECT REM PT RETURN 9FT ADLT (ELECTROSURGICAL) ×2
ELECTRODE REM PT RTRN 9FT ADLT (ELECTROSURGICAL) ×1 IMPLANT
GLOVE BIO SURGEON STRL SZ7.5 (GLOVE) ×2 IMPLANT
GLOVE BIO SURGEON STRL SZ8 (GLOVE) ×4 IMPLANT
GLOVE BIOGEL PI IND STRL 6.5 (GLOVE) ×3 IMPLANT
GLOVE BIOGEL PI INDICATOR 6.5 (GLOVE) ×3
GOWN STRL NON-REIN LRG LVL3 (GOWN DISPOSABLE) ×2 IMPLANT
GOWN STRL REIN XL XLG (GOWN DISPOSABLE) ×2 IMPLANT
IV SET HUBERPLUS 22X1 SAFETY (NEEDLE) ×2 IMPLANT
MANIFOLD NEPTUNE II (INSTRUMENTS) ×2 IMPLANT
NS IRRIG 1000ML POUR BTL (IV SOLUTION) ×2 IMPLANT
PACK TOTAL JOINT (CUSTOM PROCEDURE TRAY) ×2 IMPLANT
POSITIONER SURGICAL ARM (MISCELLANEOUS) ×2 IMPLANT
SPONGE GAUZE 4X4 12PLY (GAUZE/BANDAGES/DRESSINGS) ×2 IMPLANT
STAPLER VISISTAT 35W (STAPLE) ×2 IMPLANT
STRIP CLOSURE SKIN 1/2X4 (GAUZE/BANDAGES/DRESSINGS) ×2 IMPLANT
SUT MNCRL AB 4-0 PS2 18 (SUTURE) ×2 IMPLANT
SUT VIC AB 1 CT1 27 (SUTURE) ×2
SUT VIC AB 1 CT1 27XBRD ANTBC (SUTURE) ×2 IMPLANT
SUT VIC AB 2-0 CT1 27 (SUTURE) ×2
SUT VIC AB 2-0 CT1 TAPERPNT 27 (SUTURE) ×2 IMPLANT
SYR 30ML LL (SYRINGE) ×2 IMPLANT
TOWEL OR 17X26 10 PK STRL BLUE (TOWEL DISPOSABLE) ×4 IMPLANT
WATER STERILE IRR 1500ML POUR (IV SOLUTION) ×2 IMPLANT

## 2012-02-29 NOTE — Interval H&P Note (Signed)
History and Physical Interval Note:  02/29/2012 10:51 AM  Wanda Gonzales  has presented today for surgery, with the diagnosis of Right Hip Bursitis  The various methods of treatment have been discussed with the patient and family. After consideration of risks, benefits and other options for treatment, the patient has consented to  Procedure(s) (LRB): EXCISION BURSA WITH ILIAL-TIBIAL BAND RELEASE (Right) as a surgical intervention .  The patients' history has been reviewed, patient examined, no change in status, stable for surgery.  I have reviewed the patients' chart and labs.  Questions were answered to the patient's satisfaction.     Loanne Drilling

## 2012-02-29 NOTE — Discharge Instructions (Signed)
Hip Bursitis Bursitis is a swelling and soreness (inflammation) of a fluid-filled sac (bursa). This sac overlies and protects the joints.  CAUSES   Injury.   Overuse of the muscles surrounding the joint.   Arthritis.   Gout.   Infection.   Cold weather.   Inadequate warm-up and conditioning prior to activities.  The cause may not be known.  SYMPTOMS   Mild to severe irritation.   Tenderness and swelling over the outside of the hip.   Pain with motion of the hip.   If the bursa becomes infected, a fever may be present. Redness, tenderness, and warmth will develop over the hip.  Symptoms usually lessen in 3 to 4 weeks with treatment, but can come back. TREATMENT If conservative treatment does not work, your caregiver may advise draining the bursa and injecting cortisone into the area. This may speed up the healing process. This may also be used as an initial treatment of choice. HOME CARE INSTRUCTIONS   Apply ice to the affected area for 15 to 20 minutes every 3 to 4 hours while awake for the first 2 days. Put the ice in a plastic bag and place a towel between the bag of ice and your skin.   Rest the painful joint as much as possible, but continue to put the joint through a normal range of motion at least 4 times per day. When the pain lessens, begin normal, slow movements and usual activities to help prevent stiffness of the hip.   Only take over-the-counter or prescription medicines for pain, discomfort, or fever as directed by your caregiver.   Use crutches to limit weight bearing on the hip joint, if advised.   Elevate your painful hip to reduce swelling. Use pillows for propping and cushioning your legs and hips.   Gentle massage may provide comfort and decrease swelling.  SEEK IMMEDIATE MEDICAL CARE IF:   Your pain increases even during treatment, or you are not improving.   You have a fever.   You have heat and inflammation over the involved bursa.   You have  any other questions or concerns.  MAKE SURE YOU:   Understand these instructions.   Will watch your condition.   Will get help right away if you are not doing well or get worse.  Document Released: 02/25/2002 Document Revised: 08/25/2011 Document Reviewed: 09/24/2008 [facility] Patient Information 2012 [place], [place].  Hip Bursitis Bursitis is a swelling and soreness (inflammation) of a fluid-filled sac (bursa). This sac overlies and protects the joints.  CAUSES   Injury.   Overuse of the muscles surrounding the joint.   Arthritis.   Gout.   Infection.   Cold weather.   Inadequate warm-up and conditioning prior to activities.  The cause may not be known.  SYMPTOMS   Mild to severe irritation.   Tenderness and swelling over the outside of the hip.   Pain with motion of the hip.   If the bursa becomes infected, a fever may be present. Redness, tenderness, and warmth will develop over the hip.  Symptoms usually lessen in 3 to 4 weeks with treatment, but can come back. TREATMENT If conservative treatment does not work, your caregiver may advise draining the bursa and injecting cortisone into the area. This may speed up the healing process. This may also be used as an initial treatment of choice. HOME CARE INSTRUCTIONS   Apply ice to the affected area for 15 to 20 minutes every 3 to 4 hours while  awake for the first 2 days. Put the ice in a plastic bag and place a towel between the bag of ice and your skin.   Rest the painful joint as much as possible, but continue to put the joint through a normal range of motion at least 4 times per day. When the pain lessens, begin normal, slow movements and usual activities to help prevent stiffness of the hip.   Only take over-the-counter or prescription medicines for pain, discomfort, or fever as directed by your caregiver.   Use crutches to limit weight bearing on the hip joint, if advised.   Elevate your painful hip to reduce  swelling. Use pillows for propping and cushioning your legs and hips.   Gentle massage may provide comfort and decrease swelling.  SEEK IMMEDIATE MEDICAL CARE IF:   Your pain increases even during treatment, or you are not improving.   You have a fever.   You have heat and inflammation over the involved bursa.   You have any other questions or concerns.  MAKE SURE YOU:   Understand these instructions.   Will watch your condition.   Will get help right away if you are not doing well or get worse.  Document Released: 02/25/2002 Document Revised: 08/25/2011 Document Reviewed: 09/24/2008 [facility] Patient Information 2012 [place], [place].  Continue mupirocin ointment to bilateral nostrils until you have used a total of 10 doses

## 2012-02-29 NOTE — Preoperative (Signed)
Beta Blockers   Reason not to administer Beta Blockers:Not Applicable, not on home BB 

## 2012-02-29 NOTE — Brief Op Note (Signed)
02/29/2012  12:00 PM  PATIENT:  Wanda Gonzales  56 y.o. female  PRE-OPERATIVE DIAGNOSIS:  Right Hip Bursitis with gluteal tendon tear  POST-OPERATIVE DIAGNOSIS:  Right Hip Bursitis with gluteal tendon tear  PROCEDURE:  Procedure(s) (LRB): EXCISION BURSA WITH ILIAL-TIBIAL BAND RELEASE (Right) and gluteal tendon repair  SURGEON:  Surgeon(s) and Role:    * Loanne Drilling, MD - Primary  PHYSICIAN ASSISTANT:   ASSISTANTS: Avel Peace, PA-C   ANESTHESIA:   general  EBL:  Total I/O In: 1000 [I.V.:1000] Out: 100 [Blood:100]  DRAINS: none   LOCAL MEDICATIONS USED:  OTHER Exparel  DICTATION: .Other Dictation: Dictation Number [id number]  PLAN OF CARE: Discharge to home after PACU  PATIENT DISPOSITION:  PACU - hemodynamically stable.   Gus Rankin Ada Holness, MD    02/29/2012, 12:05 PM

## 2012-02-29 NOTE — Anesthesia Preprocedure Evaluation (Addendum)
Anesthesia Evaluation  Patient identified by MRN, date of birth, ID band Patient awake    Reviewed: Allergy & Precautions, H&P , NPO status , Patient's Chart, lab work & pertinent test results  History of Anesthesia Complications (+) PONV  Airway Mallampati: II TM Distance: >3 FB Neck ROM: full    Dental No notable dental hx. (+) Teeth Intact and Dental Advisory Given   Pulmonary neg pulmonary ROS,  breath sounds clear to auscultation  Pulmonary exam normal       Cardiovascular Exercise Tolerance: Good negative cardio ROS  Rhythm:regular Rate:Normal     Neuro/Psych negative neurological ROS  negative psych ROS   GI/Hepatic negative GI ROS, Neg liver ROS,   Endo/Other  negative endocrine ROS  Renal/GU negative Renal ROS  negative genitourinary   Musculoskeletal   Abdominal   Peds  Hematology negative hematology ROS (+)   Anesthesia Other Findings   Reproductive/Obstetrics negative OB ROS                          Anesthesia Physical Anesthesia Plan  ASA: I  Anesthesia Plan: Spinal   Post-op Pain Management:    Induction:   Airway Management Planned: Simple Face Mask  Additional Equipment:   Intra-op Plan:   Post-operative Plan:   Informed Consent: I have reviewed the patients History and Physical, chart, labs and discussed the procedure including the risks, benefits and alternatives for the proposed anesthesia with the patient or authorized representative who has indicated his/her understanding and acceptance.   Dental Advisory Given  Plan Discussed with: CRNA and Surgeon  Anesthesia Plan Comments:        Anesthesia Quick Evaluation

## 2012-02-29 NOTE — Progress Notes (Signed)
Patient able to ambulate with assistance, no numbness in legs, patient and husband received discharge instructions, verbalized understanding. Teach back method

## 2012-02-29 NOTE — Anesthesia Procedure Notes (Signed)
Spinal  Patient location during procedure: OR Start time: 02/29/2012 11:00 AM End time: 02/29/2012 11:05 AM Staffing Anesthesiologist: Ronelle Nigh L Performed by: anesthesiologist  Preanesthetic Checklist Completed: patient identified, site marked, surgical consent, pre-op evaluation, timeout performed, IV checked, risks and benefits discussed and monitors and equipment checked Spinal Block Patient position: sitting Prep: Betadine Patient monitoring: heart rate, continuous pulse ox and blood pressure Approach: midline Location: L3-4 Injection technique: single-shot Needle Needle type: Whitacre  Needle gauge: 25 G Needle length: 9 cm Assessment Sensory level: T6 Additional Notes Expiration date of kit checked and confirmed. Patient tolerated procedure well, without complications.

## 2012-02-29 NOTE — Transfer of Care (Signed)
Immediate Anesthesia Transfer of Care Note  Patient: Wanda Gonzales  Procedure(s) Performed: Procedure(s) (LRB): EXCISION BURSA WITH ILIAL-TIBIAL BAND RELEASE (Right)  Patient Location: PACU  Anesthesia Type: Regional  Level of Consciousness: awake and alert   Airway & Oxygen Therapy: Patient Spontanous Breathing and Patient connected to face mask oxygen  Post-op Assessment: Report given to PACU RN and Post -op Vital signs reviewed and stable  Post vital signs: Reviewed and stable  Complications: No apparent anesthesia complications

## 2012-02-29 NOTE — Anesthesia Postprocedure Evaluation (Signed)
  Anesthesia Post-op Note  Patient: Wanda Gonzales  Procedure(s) Performed: Procedure(s) (LRB): EXCISION BURSA WITH ILIAL-TIBIAL BAND RELEASE (Right)  Patient Location: PACU  Anesthesia Type: Spinal  Level of Consciousness: awake and alert   Airway and Oxygen Therapy: Patient Spontanous Breathing  Post-op Pain: mild  Post-op Assessment: Post-op Vital signs reviewed, Patient's Cardiovascular Status Stable, Respiratory Function Stable, Patent Airway and No signs of Nausea or vomiting  Post-op Vital Signs: stable  Complications: No apparent anesthesia complications

## 2012-03-01 NOTE — Op Note (Signed)
Wanda Gonzales, Wanda Gonzales NO.:  [id number]  MEDICAL RECORD NO.:  [id number]  LOCATION:  [facility]                         FACILITY:  [facility]  PHYSICIAN:  Ollen Gross, M.D.    DATE OF BIRTH:  07-02-1956  DATE OF PROCEDURE: DATE OF DISCHARGE:  02/29/2012                              OPERATIVE REPORT   PREOPERATIVE DIAGNOSIS:  Intractable bursitis, right hip with gluteal tendon tear.  POSTOPERATIVE DIAGNOSIS:  Intractable bursitis, right hip with gluteal tendon tear.  PROCEDURE:  Right hip bursectomy with gluteal tendon repair.  SURGEON:  Ollen Gross, M.D.  ASSISTANT:  Alexzandrew L. Perkins, P.A.C.  ANESTHESIA:  General.  ESTIMATED BLOOD LOSS:  Minimal.  DRAINS:  None.  COMPLICATIONS:  None.  CONDITION:  Stable to Recovery.  BRIEF CLINICAL NOTE:  Wanda Gonzales is a 56 year old female with a long history of right lateral hip pain.  She has not respond to injection or physical therapy.  MRI showed equivocal findings.  Given her intractable pain and dysfunction with lack of response to nonoperative management, it was decided now to pursue surgical treatment for the bursectomy and possible repair if there is a tendon tear.  PROCEDURE IN DETAIL:  After successful administration of general anesthetic, the patient was placed in the left lateral decubitus position with the right side up and held with the hip positioner.  Right lower extremity was isolated from her perineum with plastic drapes and prepped and draped in the usual sterile fashion.  A longitudinal incision was made centered over the tip of the greater trochanter from proximal to distal measuring about 4 inches in length.  The skin was cut with a 10 blade through subcutaneous tissue to the fascia lata, which was incised in line with the skin incision.  Bursa was identified, it was thickened and enlarged and it was removed.  There was a small amount of fluid within the bursa.  The anterior aspect of the  gluteus medius has an abnormal appearance on the trochanter.  It looked like an overlapped greater trochanter instead of inserting onto it.  Upon further inspection, there was a flimsy piece of tissue adjacent to the tendon and was evident with removing this tissue that the tendon had detached.  Placed two Mitek anchors into the tip of the greater trochanter and then passed the sutures through the leading ends of the tendon and advanced it down onto the bone and then over sewed it back. This was a very stable repair.  Further inspection was performed.  No other tears were noted.  There was no other bursal tissue left.  I then thoroughly irrigated the wound and injected the gluteal muscles and the fascia lata with a total of 20 mL of Exparel mixed with 50 mL of saline. This was also injected into the subcu tissues.  Fascia lata was closed with interrupted #1 Vicryl, removing a small triangular area of tissue over the tip of the trochanter so would not rub over the trochanter. Subcu was then closed with #1 interrupted 2-0 Vicryl and subcuticular running 4-0 Monocryl.  Incision was then cleaned and dried, and Steri- Strips and a bulky sterile dressing were applied.  She  was then awakened and transported to recovery in stable condition.     Ollen Gross, M.D.     FA/MEDQ  D:  02/29/2012  T:  03/01/2012  Job:  [id number]

## 2012-03-26 ENCOUNTER — Other Ambulatory Visit: Payer: Self-pay | Admitting: *Deleted

## 2012-03-26 ENCOUNTER — Telehealth: Payer: Self-pay | Admitting: Family Medicine

## 2012-03-26 MED ORDER — AMPHETAMINE-DEXTROAMPHET ER 25 MG PO CP24: 25.0000 mg | ORAL_CAPSULE | ORAL | Status: DC

## 2012-03-26 NOTE — Telephone Encounter (Signed)
Patient walk-in wanted to advise Dr. Linford Arnold she had surgery and Bursa and Tenotomy removed from right hip a month ago at [facility] by Dr. Hermenia Bers w/ Tomasita Crumble

## 2012-04-13 ENCOUNTER — Ambulatory Visit: Payer: BC Managed Care – PPO | Attending: Orthopedic Surgery | Admitting: Physical Therapy

## 2012-04-13 DIAGNOSIS — IMO0001 Reserved for inherently not codable concepts without codable children: Secondary | ICD-10-CM | POA: Insufficient documentation

## 2012-04-13 DIAGNOSIS — M25559 Pain in unspecified hip: Secondary | ICD-10-CM | POA: Insufficient documentation

## 2012-04-13 DIAGNOSIS — R262 Difficulty in walking, not elsewhere classified: Secondary | ICD-10-CM | POA: Insufficient documentation

## 2012-04-13 DIAGNOSIS — M6281 Muscle weakness (generalized): Secondary | ICD-10-CM | POA: Insufficient documentation

## 2012-04-16 ENCOUNTER — Ambulatory Visit: Payer: BC Managed Care – PPO | Admitting: Physical Therapy

## 2012-04-19 ENCOUNTER — Ambulatory Visit: Payer: BC Managed Care – PPO | Attending: Orthopedic Surgery | Admitting: Physical Therapy

## 2012-04-19 DIAGNOSIS — M6281 Muscle weakness (generalized): Secondary | ICD-10-CM | POA: Insufficient documentation

## 2012-04-19 DIAGNOSIS — R262 Difficulty in walking, not elsewhere classified: Secondary | ICD-10-CM | POA: Insufficient documentation

## 2012-04-19 DIAGNOSIS — M25559 Pain in unspecified hip: Secondary | ICD-10-CM | POA: Insufficient documentation

## 2012-04-19 DIAGNOSIS — IMO0001 Reserved for inherently not codable concepts without codable children: Secondary | ICD-10-CM | POA: Insufficient documentation

## 2012-05-01 ENCOUNTER — Ambulatory Visit: Payer: BC Managed Care – PPO | Admitting: Physical Therapy

## 2012-05-03 ENCOUNTER — Ambulatory Visit: Payer: BC Managed Care – PPO | Admitting: Physical Therapy

## 2012-05-07 ENCOUNTER — Ambulatory Visit: Payer: BC Managed Care – PPO | Admitting: Physical Therapy

## 2012-05-09 ENCOUNTER — Ambulatory Visit: Payer: BC Managed Care – PPO | Admitting: Physical Therapy

## 2012-05-25 ENCOUNTER — Other Ambulatory Visit: Payer: Self-pay | Admitting: *Deleted

## 2012-05-25 MED ORDER — AMPHETAMINE-DEXTROAMPHET ER 25 MG PO CP24: 25.0000 mg | ORAL_CAPSULE | ORAL | Status: DC

## 2012-06-15 ENCOUNTER — Emergency Department
Admission: EM | Admit: 2012-06-15 | Discharge: 2012-06-15 | Disposition: A | Discharge status: Home / Self Care | Payer: BC Managed Care – PPO | Source: Home / Self Care | Attending: Family Medicine | Admitting: Family Medicine

## 2012-06-15 ENCOUNTER — Encounter: Payer: Self-pay | Admitting: Emergency Medicine

## 2012-06-15 DIAGNOSIS — M26629 Arthralgia of temporomandibular joint, unspecified side: Secondary | ICD-10-CM

## 2012-06-15 DIAGNOSIS — H9201 Otalgia, right ear: Secondary | ICD-10-CM

## 2012-06-15 DIAGNOSIS — H9209 Otalgia, unspecified ear: Secondary | ICD-10-CM

## 2012-06-15 MED ORDER — METHOCARBAMOL 750 MG PO TABS: ORAL_TABLET | ORAL | Status: DC

## 2012-06-15 NOTE — ED Provider Notes (Signed)
History     CSN: [id number]  Arrival date & time 06/15/12  1901   First MD Initiated Contact with Patient 06/15/12 1927      Chief Complaint  Patient presents with  . Otalgia      HPI Comments: Patient awoke with a right earache today.  She recalls developing unexplained fatigue over the past 2 or 3 days, and generalized myalgias.  No fevers, chills, and sweats, but possibly some chills.  No sore throat or cough.  She had a dental procedure on left side of mouth yesterday. 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  She has been prescribed Robaxin in the past.  Patient is a 56 y.o. female presenting with ear pain. The history is provided by the patient.  Otalgia This is a new problem. The current episode started 6 to 12 hours ago. There is pain in the right ear. The problem occurs constantly. The problem has not changed since onset.There has been no fever. The pain is at a severity of 8/10. The pain is moderate. Associated symptoms include neck pain. Pertinent negatives include no ear discharge, no headaches, no hearing loss, no rhinorrhea, no sore throat, no cough and no rash.    Past Medical History  Diagnosis Date  . Varicose veins   . PONV (postoperative nausea and vomiting) 02-23-12    always has PONV  . Post-nasal drainage 02-23-12    frequent a problem,causes a cough and mucus buildup in throat  . ADD (attention deficit disorder) 02-23-12    easily distracted.  . Venous insufficiency     chronic-denies any problems  . Arthritis 02-23-12    Rt. hip and lt. arm    Past Surgical History  Procedure Date  . 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 Breast surgery 02-23-12    Rt. needle biopsy, recent -negative  . Hand surgery 02-23-12    "blood clot" excised palm left hand 01-18-12    Family History  Problem Relation Age of Onset  . Cancer Mother     breast  . Hyperlipidemia  Mother   . Cancer Father     brain    History  Substance Use Topics  . Smoking status: Never Smoker   . Smokeless tobacco: Not on file  . Alcohol Use: Yes     rare occ.    OB History    Grav Para Term Preterm Abortions TAB SAB Ect Mult Living                  Review of Systems  HENT: Positive for ear pain and neck pain. Negative for hearing loss, sore throat, rhinorrhea and ear discharge.   Respiratory: Negative for cough.   Skin: Negative for rash.  Neurological: Negative for headaches.  All other systems reviewed and are negative.   No sore throat No cough No pleuritic pain No wheezing ? nasal congestion No post-nasal drainage No sinus pain/pressure No itchy/red eyes + right earache No hemoptysis No SOB No fever, + chills No nausea No vomiting No abdominal pain No diarrhea No urinary symptoms No skin rashes + fatigue + myalgias No headache   Allergies  Codeine  Home Medications   Current Outpatient Rx  Name Route Sig Dispense Refill  . AMPHETAMINE-DEXTROAMPHET ER 25 MG PO CP24 Oral Take 1 capsule (25 mg total) by mouth every morning. 30 capsule 0  .  AMPHETAMINE-DEXTROAMPHET ER 25 MG PO CP24 Oral Take 1 capsule (25 mg total) by mouth every morning. 30 capsule 0  . AMPHETAMINE-DEXTROAMPHET ER 25 MG PO CP24 Oral Take 1 capsule (25 mg total) by mouth every morning. 30 capsule 0  . VITAMIN C 250 MG PO CHEW Oral Chew 1 tablet by mouth daily with breakfast. gummie.    Marland Kitchen OMEGA-3 FATTY ACIDS 1000 MG PO CAPS Oral Take 4 g by mouth daily.     Marland Kitchen FLUOXETINE HCL 10 MG PO TABS Oral Take 10 mg by mouth at bedtime.     . METHOCARBAMOL 750 MG PO TABS  Take one tab by mouth at bedtime 12 tablet 1  . ONE-DAILY MULTI VITAMINS PO TABS Oral Take 1 tablet by mouth daily.      Marland Kitchen NIACIN ER (ANTIHYPERLIPIDEMIC) 1000 MG PO TBCR Oral Take 1,000 mg by mouth at bedtime.      Marland Kitchen OVER THE COUNTER MEDICATION Oral Take 2 tablets by mouth daily with breakfast. Vitamin D gummie       BP 107/72  Pulse 81  Temp 98 F (36.7 C) (Oral)  Resp 18  Ht 5\' 1"  (1.549 m)  Wt 130 lb (58.968 kg)  BMI 24.56 kg/m2  SpO2 99%  LMP 06/08/2012  Physical Exam Nursing notes and Vital Signs reviewed. Appearance:  Patient appears healthy, stated age, and in no acute distress Eyes:  Pupils are equal, round, and reactive to light and accomodation.  Extraocular movement is intact.  Conjunctivae are not inflamed  Ears:  Canals normal but there is tenderness with insertion of speculum right external auditory canal.  Tympanic membranes normal bilaterally.  There is distinct tenderness to palpation over the right TMJ.  Nose:  Mildly congested turbinates.  No sinus tenderness.  Pharynx:  Normal Neck:  Supple.  Tender shotty posterior nodes are palpated bilaterally  Lungs:  Clear to auscultation.  Breath sounds are equal.  Heart:  Regular rate and rhythm without murmurs, rubs, or gallops.  Abdomen:  Nontender without masses or hepatosplenomegaly.  Bowel sounds are present.  No CVA or flank tenderness.  Extremities:  No edema.  No calf tenderness Skin:  No rash present.   ED Course  Procedures none  Labs Reviewed -  Tympanogram:  Low peak height both ears    1. Otalgia of right ear   2. TMJ arthralgia        Suspect early viral URI (note increased fatigue and generalized myalgias in addition to right TMJ pain)    MDM  Begin Robaxin at bedtime. Apply ice pack to TMJ several times daily.  Take Ibuprofen 200mg , 4 tabs every 8 hours with food.  Use mouth guards at night.  Begin jaw exercises. Followup with ENT if not improving.        Lattie Haw, MD 06/16/12 [phone]

## 2012-06-15 NOTE — ED Notes (Signed)
Reports intense right ear pain starting earlier today. Did have dental procedure yesterday on LEFT side of mouth.

## 2012-06-17 ENCOUNTER — Telehealth: Payer: Self-pay | Admitting: Family Medicine

## 2012-06-27 ENCOUNTER — Other Ambulatory Visit: Payer: Self-pay | Admitting: *Deleted

## 2012-06-27 MED ORDER — AMPHETAMINE-DEXTROAMPHET ER 25 MG PO CP24: 25.0000 mg | ORAL_CAPSULE | ORAL | Status: DC

## 2012-07-27 ENCOUNTER — Other Ambulatory Visit: Payer: Self-pay | Admitting: *Deleted

## 2012-07-27 MED ORDER — AMPHETAMINE-DEXTROAMPHET ER 25 MG PO CP24: 25.0000 mg | ORAL_CAPSULE | ORAL | Status: DC

## 2012-08-15 ENCOUNTER — Encounter: Payer: Self-pay | Admitting: Family Medicine

## 2012-08-15 ENCOUNTER — Ambulatory Visit (INDEPENDENT_AMBULATORY_CARE_PROVIDER_SITE_OTHER): Payer: BC Managed Care – PPO | Admitting: Family Medicine

## 2012-08-15 VITALS — BP 99/67 | HR 81 | Ht 61.0 in | Wt 131.0 lb

## 2012-08-15 DIAGNOSIS — Z23 Encounter for immunization: Secondary | ICD-10-CM

## 2012-08-15 DIAGNOSIS — F988 Other specified behavioral and emotional disorders with onset usually occurring in childhood and adolescence: Secondary | ICD-10-CM

## 2012-08-15 DIAGNOSIS — E785 Hyperlipidemia, unspecified: Secondary | ICD-10-CM

## 2012-08-15 DIAGNOSIS — F411 Generalized anxiety disorder: Secondary | ICD-10-CM

## 2012-08-15 MED ORDER — AMPHETAMINE-DEXTROAMPHET ER 25 MG PO CP24: 25.0000 mg | ORAL_CAPSULE | ORAL | Status: DC

## 2012-08-15 NOTE — Progress Notes (Signed)
  Subjective:    Patient ID: Wanda Gonzales, female    DOB: [birth year], 56 y.o.   MRN: [id number]  HPI ADD- feels her sxs are well controlled. Has a stressful class this year.  No CP or SOB. Sleeping is ok.  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   Anxiety - Dong well on fluoextine. No side effects on the medication. Though some days thinks needs more medicatoin. Says sometimes feel really wound up and anxious at times.   Review of Systems     Objective:   Physical Exam  Constitutional: She is oriented to person, place, and time. She appears well-developed and well-nourished.  HENT:  Head: Normocephalic and atraumatic.  Cardiovascular: Normal rate, regular rhythm and normal heart sounds.   Pulmonary/Chest: Effort normal and breath sounds normal.  Neurological: She is alert and oriented to person, place, and time.  Skin: Skin is warm and dry.  Psychiatric: She has a normal mood and affect. Her behavior is normal.          Assessment & Plan:  ADD - Doing well. Due for Refill. F/U in 6 months. No cardiac complaints.   Anxiety - Can increase her fluoxetine to 20mg .  I think this would be helpful.  She says she has a large quantity of the 10 mg at home so I encouraged her to take to the same time and try for at least a month to see if this is helpful for her. If it is then we can change her prescription to 20 mg.  Flu vaccine given today.  Hyperlipidemia-she says her GYN follows her cholesterol and has actually come down. I asked her to please have her GYN fax his results to me.

## 2012-08-30 ENCOUNTER — Other Ambulatory Visit: Payer: Self-pay | Admitting: *Deleted

## 2012-08-30 MED ORDER — AMPHETAMINE-DEXTROAMPHET ER 25 MG PO CP24: 25.0000 mg | ORAL_CAPSULE | ORAL | Status: DC

## 2012-10-01 ENCOUNTER — Other Ambulatory Visit: Payer: Self-pay

## 2012-10-01 DIAGNOSIS — F988 Other specified behavioral and emotional disorders with onset usually occurring in childhood and adolescence: Secondary | ICD-10-CM

## 2012-10-01 MED ORDER — AMPHETAMINE-DEXTROAMPHET ER 25 MG PO CP24: 25.0000 mg | ORAL_CAPSULE | ORAL | Status: DC

## 2012-10-20 HISTORY — PX: KNEE ARTHROSCOPY WITH EXCISION BAKER'S CYST: SHX5646

## 2012-10-20 HISTORY — PX: KNEE ARTHROSCOPY W/ MENISCAL REPAIR: SHX1877

## 2012-11-02 ENCOUNTER — Other Ambulatory Visit: Payer: Self-pay | Admitting: *Deleted

## 2012-11-02 DIAGNOSIS — F988 Other specified behavioral and emotional disorders with onset usually occurring in childhood and adolescence: Secondary | ICD-10-CM

## 2012-11-02 MED ORDER — AMPHETAMINE-DEXTROAMPHET ER 25 MG PO CP24: 25.0000 mg | ORAL_CAPSULE | ORAL | Status: DC

## 2012-12-03 ENCOUNTER — Telehealth: Payer: Self-pay

## 2012-12-03 DIAGNOSIS — F988 Other specified behavioral and emotional disorders with onset usually occurring in childhood and adolescence: Secondary | ICD-10-CM

## 2012-12-03 MED ORDER — AMPHETAMINE-DEXTROAMPHET ER 25 MG PO CP24: 25.0000 mg | ORAL_CAPSULE | ORAL | Status: DC

## 2012-12-03 NOTE — Telephone Encounter (Signed)
Prescription is up front. 

## 2012-12-19 ENCOUNTER — Encounter (HOSPITAL_COMMUNITY): Admission: RE | Payer: Self-pay | Source: Ambulatory Visit

## 2012-12-19 ENCOUNTER — Ambulatory Visit (HOSPITAL_COMMUNITY)
Admission: RE | Admit: 2012-12-19 | Payer: BC Managed Care – PPO | Source: Ambulatory Visit | Admitting: Orthopedic Surgery

## 2012-12-19 SURGERY — ARTHROSCOPY, KNEE
Anesthesia: Choice | Site: Knee | Laterality: Left

## 2013-01-07 ENCOUNTER — Encounter: Payer: Self-pay | Admitting: *Deleted

## 2013-01-07 ENCOUNTER — Encounter: Payer: Self-pay | Admitting: Nurse Practitioner

## 2013-01-08 ENCOUNTER — Ambulatory Visit (INDEPENDENT_AMBULATORY_CARE_PROVIDER_SITE_OTHER): Payer: BC Managed Care – PPO | Admitting: Nurse Practitioner

## 2013-01-08 ENCOUNTER — Other Ambulatory Visit: Payer: Self-pay | Admitting: *Deleted

## 2013-01-08 ENCOUNTER — Encounter: Payer: Self-pay | Admitting: Nurse Practitioner

## 2013-01-08 VITALS — BP 114/60 | HR 72 | Ht 62.75 in | Wt 132.0 lb

## 2013-01-08 DIAGNOSIS — Z01419 Encounter for gynecological examination (general) (routine) without abnormal findings: Secondary | ICD-10-CM

## 2013-01-08 DIAGNOSIS — F988 Other specified behavioral and emotional disorders with onset usually occurring in childhood and adolescence: Secondary | ICD-10-CM

## 2013-01-08 DIAGNOSIS — Z0000 Encounter for general adult medical examination without abnormal findings: Secondary | ICD-10-CM

## 2013-01-08 DIAGNOSIS — E559 Vitamin D deficiency, unspecified: Secondary | ICD-10-CM

## 2013-01-08 LAB — COMPREHENSIVE METABOLIC PANEL
ALT: 9 U/L (ref 0–35)
AST: 18 U/L (ref 0–37)
Albumin: 4 g/dL (ref 3.5–5.2)
Alkaline Phosphatase: 56 U/L (ref 39–117)
BUN: 17 mg/dL (ref 6–23)
CO2: 29 mEq/L (ref 19–32)
Calcium: 9.7 mg/dL (ref 8.4–10.5)
Chloride: 106 mEq/L (ref 96–112)
Creat: 0.68 mg/dL (ref 0.50–1.10)
Glucose, Bld: 86 mg/dL (ref 70–99)
Potassium: 4.7 mEq/L (ref 3.5–5.3)
Sodium: 140 mEq/L (ref 135–145)
Total Bilirubin: 0.6 mg/dL (ref 0.3–1.2)
Total Protein: 6.7 g/dL (ref 6.0–8.3)

## 2013-01-08 LAB — LIPID PANEL
Cholesterol: 198 mg/dL (ref 0–200)
HDL: 58 mg/dL (ref >39)
LDL Cholesterol: 124 mg/dL — ABNORMAL HIGH (ref 0–99)
Total CHOL/HDL Ratio: 3.4 Ratio
Triglycerides: 80 mg/dL (ref <150)
VLDL: 16 mg/dL (ref 0–40)

## 2013-01-08 LAB — POCT URINALYSIS DIPSTICK
Leukocytes, UA: NEGATIVE
Spec Grav, UA: 1.015
Urobilinogen, UA: NEGATIVE
pH, UA: 7

## 2013-01-08 LAB — TSH: TSH: 0.887 u[IU]/mL (ref 0.350–4.500)

## 2013-01-08 MED ORDER — AMPHETAMINE-DEXTROAMPHET ER 25 MG PO CP24: 25.0000 mg | ORAL_CAPSULE | ORAL | Status: DC

## 2013-01-08 NOTE — Progress Notes (Signed)
Encounter reviewed by Dr. Derek Laughter Silva.  

## 2013-01-08 NOTE — Progress Notes (Signed)
57 y.o. G0P0 Married Caucasian Fe here for annual exam. She has been off [place] since 12/2010.   PMP was 06/2012 and monthly would have symptoms of bloating and PMS like a period was starting but no cycle. Last Wednesday had a cycle for 2 days, moderate flow with usual bloating. There has been a lot of stress at work and actually plans to retire in 37 days.  Plans to get back to normal life and exercise. Some mild vaso symptoms but they are tolerable. 'Warm' in general.  No vaginal dryness. Had stereotactic guided biopsy to right breast last 02/12/12; path shows fibroadenoma.  No LMP recorded.          Sexually active: yes  The current method of family planning is none.  (pt is aware of BUM if still having cycles)  Exercising: yes  Gym/ health club routine includes cardio. Smoker:  no  Health Maintenance: Pap:  01/05/2012  Normal with neg HR HPV MMG:  02/10/2012 recheck 1 year screening for fibroadenoma on right Colonoscopy:  12/2008 1 polyp and recheck in 5 years BMD:   TDaP:  2007 Labs: HgB- 12.7      reports that she has never smoked. She does not have any smokeless tobacco history on file. She reports that  drinks alcohol. She reports that she does not use illicit drugs.  Past Medical History  Diagnosis Date  . Varicose veins   . PONV (postoperative nausea and vomiting) 02-23-12    always has PONV  . Post-nasal drainage 02-23-12    frequent a problem,causes a cough and mucus buildup in throat  . ADD (attention deficit disorder) 02-23-12    easily distracted.  . Venous insufficiency     chronic-denies any problems  . Arthritis 02-23-12    Rt. hip and lt. arm  . Abnormal Pap smear, atypical squamous cells of undetermined sign (ASC-US) 1998    treated with cryo, CIN I  . Urethral polyp 1998    Dr. Marcello Fennel    Past Surgical History  Procedure Laterality Date  . 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 Hand surgery  02-23-12    "blood  clot" excised palm left hand 01-18-12  . Excision/release bursa hip  02/29/12  . Breast surgery  02-23-12    Rt. needle biopsy, recent -negative  path showed fibroadenoma  . Colonoscopy  08/2003    repeat 5 yrs.  . Colonoscopy w/ biopsies  12/2008    1 polyp recheck 5 yrs    Current Outpatient Prescriptions  Medication Sig Dispense Refill  . amphetamine-dextroamphetamine (ADDERALL XR) 25 MG 24 hr capsule Take 1 capsule (25 mg total) by mouth every morning.  30 capsule  0  . Ascorbic Acid (VITAMIN C) 250 MG CHEW Chew 1 tablet by mouth daily with breakfast. gummie.      . fish oil-omega-3 fatty acids 1000 MG capsule Take 4 g by mouth daily.       Marland Kitchen FLUoxetine (PROZAC) 10 MG tablet Take 10 mg by mouth at bedtime.       . meloxicam (MOBIC) 7.5 MG tablet Take 7.5 mg by mouth daily.      . Multiple Vitamin (MULTIVITAMIN) tablet Take 1 tablet by mouth daily.        . niacin (NIASPAN) 1000 MG CR tablet Take 1,000 mg by mouth at bedtime.        Marland Kitchen OVER THE COUNTER MEDICATION Take 2 tablets  by mouth daily with breakfast. Vitamin D gummie       No current facility-administered medications for this visit.    Family History  Problem Relation Age of Onset  . Breast cancer Mother 64    breast  . Hyperlipidemia Mother   . Brain cancer Father 33    brain  . Osteoporosis Maternal Grandmother   . Drug abuse Brother     ROS:  Pertinent items are noted in HPI.  Otherwise, a comprehensive ROS was negative.  Exam:   There were no vitals taken for this visit.    Ht Readings from Last 3 Encounters:  08/15/12 5\' 1"  (1.549 m)  06/15/12 5\' 1"  (1.549 m)  02/23/12 5\' 1"  (1.549 m)    General appearance: alert, cooperative and appears stated age Head: Normocephalic, without obvious abnormality, atraumatic Neck: no adenopathy, supple, symmetrical, trachea midline and thyroid normal to inspection and palpation Lungs: clear to auscultation bilaterally Breasts: normal appearance, no masses or  tenderness Heart: regular rate and rhythm Abdomen: soft, non-tender; no masses,  no organomegaly Extremities: extremities normal, atraumatic, no cyanosis or edema Skin: Skin color, texture, turgor normal. No rashes or lesions Lymph nodes: Cervical, supraclavicular, and axillary nodes normal. No abnormal inguinal nodes palpated Neurologic: Grossly normal   Pelvic: External genitalia:  no lesions              Urethra:  normal appearing urethra with no masses, tenderness or lesions              Bartholin's and Skene's: normal                 Vagina: normal appearing vagina with normal color and discharge, no lesions              Cervix: anteverted              Pap taken: no Bimanual Exam:  Uterus:  normal size, contour, position, consistency, mobility, non-tender              Adnexa: no mass, fullness, tenderness               Rectovaginal: Confirms               Anus:  normal sphincter tone, no lesions  A:  Well Woman with normal exam  Peri menopausal consistent with irregular cycles  Vit D deficiency  P:   Mammogram due 01/2013  pap smear as per guidelines  return annually or prn  Routine labs for her to take to PCP  An After Visit Summary was printed and given to the patient.

## 2013-01-08 NOTE — Patient Instructions (Addendum)
EXERCISE AND DIET:  We recommended that you start or continue a regular exercise program for good health. Regular exercise means any activity that makes your heart beat faster and makes you sweat.  We recommend exercising at least 30 minutes per day at least 3 days a week, preferably 4 or 5.  We also recommend a diet low in fat and sugar.  Inactivity, poor dietary choices and obesity can cause diabetes, heart attack, stroke, and kidney damage, among others.    ALCOHOL AND SMOKING:  Women should limit their alcohol intake to no more than 7 drinks/beers/glasses of wine (combined, not each!) per week. Moderation of alcohol intake to this level decreases your risk of breast cancer and liver damage. And of course, no recreational drugs are part of a healthy lifestyle.  And absolutely no smoking or even second hand smoke. Most people know smoking can cause heart and lung diseases, but did you know it also contributes to weakening of your bones? Aging of your skin?  Yellowing of your teeth and nails?  CALCIUM AND VITAMIN D:  Adequate intake of calcium and Vitamin D are recommended.  The recommendations for exact amounts of these supplements seem to change often, but generally speaking 600 mg of calcium (either carbonate or citrate) and 800 units of Vitamin D per day seems prudent. Certain women may benefit from higher intake of Vitamin D.  If you are among these women, your doctor will have told you during your visit.    PAP SMEARS:  Pap smears, to check for cervical cancer or precancers,  have traditionally been done yearly, although recent scientific advances have shown that most women can have pap smears less often.  However, every woman still should have a physical exam from her gynecologist every year. It will include a breast check, inspection of the vulva and vagina to check for abnormal growths or skin changes, a visual exam of the cervix, and then an exam to evaluate the size and shape of the uterus and  ovaries.  And after 56 years of age, a rectal exam is indicated to check for rectal cancers. We will also provide age appropriate advice regarding health maintenance, like when you should have certain vaccines, screening for sexually transmitted diseases, bone density testing, colonoscopy, mammograms, etc.   MAMMOGRAMS:  All women over 8 years old should have a yearly mammogram. Many facilities now offer a "3D" mammogram, which may cost around $50 extra out of pocket. If possible,  we recommend you accept the option to have the 3D mammogram performed.  It both reduces the number of women who will be called back for extra views which then turn out to be normal, and it is better than the routine mammogram at detecting truly abnormal areas.    COLONOSCOPY:  Colonoscopy to screen for colon cancer is recommended for all women at age 56.  We know, you hate the idea of the prep.  We agree, BUT, having colon cancer and not knowing it is worse!!  Colon cancer so often starts as a polyp that can be seen and removed at colonoscopy, which can quite literally save your life!  And if your first colonoscopy is normal and you have no family history of colon cancer, most women don't have to have it again for 10 years.  Once every ten years, you can do something that may end up saving your life, right?  We will be happy to help you get it scheduled when you are ready.  Be sure to check your insurance coverage so you understand how much it will cost.  It may be covered as a preventative service at no cost, but you should check your particular policy.    If no menses in the next 90 days call back to the office for a Provera challenge.  You would take this pill for 7 days and if any endometrial lining you would have a withdrawal bleeding.  If no bleeding you would call back 2 wks later after last Provera.

## 2013-01-09 LAB — HEMOGLOBIN, FINGERSTICK: Hemoglobin, fingerstick: 12.7 g/dL (ref 12.0–16.0)

## 2013-01-09 LAB — VITAMIN D 25 HYDROXY (VIT D DEFICIENCY, FRACTURES): Vit D, 25-Hydroxy: 29 ng/mL — ABNORMAL LOW (ref 30–89)

## 2013-01-11 ENCOUNTER — Telehealth: Payer: Self-pay | Admitting: *Deleted

## 2013-01-15 ENCOUNTER — Telehealth: Payer: Self-pay | Admitting: Nurse Practitioner

## 2013-01-15 NOTE — Telephone Encounter (Signed)
Patient returning Tiffany's call.

## 2013-01-18 NOTE — Telephone Encounter (Signed)
LVM (2nd) for ot to return my call in regards to results.

## 2013-01-28 ENCOUNTER — Other Ambulatory Visit: Payer: Self-pay

## 2013-01-28 ENCOUNTER — Other Ambulatory Visit: Payer: Self-pay | Admitting: Family Medicine

## 2013-01-28 DIAGNOSIS — Z1231 Encounter for screening mammogram for malignant neoplasm of breast: Secondary | ICD-10-CM

## 2013-02-12 ENCOUNTER — Ambulatory Visit (INDEPENDENT_AMBULATORY_CARE_PROVIDER_SITE_OTHER): Payer: BC Managed Care – PPO

## 2013-02-12 ENCOUNTER — Other Ambulatory Visit: Payer: Self-pay | Admitting: *Deleted

## 2013-02-12 DIAGNOSIS — Z1231 Encounter for screening mammogram for malignant neoplasm of breast: Secondary | ICD-10-CM

## 2013-02-12 DIAGNOSIS — F988 Other specified behavioral and emotional disorders with onset usually occurring in childhood and adolescence: Secondary | ICD-10-CM

## 2013-02-12 MED ORDER — AMPHETAMINE-DEXTROAMPHET ER 25 MG PO CP24: 25.0000 mg | ORAL_CAPSULE | ORAL | Status: DC

## 2013-03-14 ENCOUNTER — Telehealth: Payer: Self-pay | Admitting: *Deleted

## 2013-03-14 NOTE — Telephone Encounter (Signed)
Left detailed message on vm for pt to make f/u appt.

## 2013-03-14 NOTE — Telephone Encounter (Signed)
Needs f/u appt. Hasn't been seen in since November Let her know new policy is needs to be seen twice a year for scheduled drugs like adderall.

## 2013-03-14 NOTE — Telephone Encounter (Signed)
Pt calls & requests a refill on her adderall.  Ok to fill?

## 2013-03-18 ENCOUNTER — Ambulatory Visit (INDEPENDENT_AMBULATORY_CARE_PROVIDER_SITE_OTHER): Payer: BC Managed Care – PPO | Admitting: Family Medicine

## 2013-03-18 ENCOUNTER — Encounter: Payer: Self-pay | Admitting: Family Medicine

## 2013-03-18 VITALS — BP 115/71 | HR 80 | Wt 136.0 lb

## 2013-03-18 DIAGNOSIS — F988 Other specified behavioral and emotional disorders with onset usually occurring in childhood and adolescence: Secondary | ICD-10-CM

## 2013-03-18 DIAGNOSIS — L821 Other seborrheic keratosis: Secondary | ICD-10-CM

## 2013-03-18 DIAGNOSIS — M7122 Synovial cyst of popliteal space [Baker], left knee: Secondary | ICD-10-CM

## 2013-03-18 DIAGNOSIS — M7120 Synovial cyst of popliteal space [Baker], unspecified knee: Secondary | ICD-10-CM

## 2013-03-18 DIAGNOSIS — N760 Acute vaginitis: Secondary | ICD-10-CM

## 2013-03-18 DIAGNOSIS — E559 Vitamin D deficiency, unspecified: Secondary | ICD-10-CM

## 2013-03-18 MED ORDER — AMPHETAMINE-DEXTROAMPHET ER 25 MG PO CP24: 25.0000 mg | ORAL_CAPSULE | ORAL | Status: DC

## 2013-03-18 NOTE — Progress Notes (Signed)
Subjective:    Patient ID: Wanda Gonzales, female    DOB: [birth year], 57 y.o.   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  She plans on starting to exercise. She had surgery on her left knee in February. As she's had difficulty with a Baker's cyst that has been filling with fluid. She does have a followup with her orthopedist coming up soon. She start glucosamine chondroitin.    ADD- no CP or palpitations or SOB. Not bothering her sleep. Not skipping meals. She's actually showing with gaining weight instead of losing weight.  Hyperlipidemia - Doing well overall. Repeat levels looked great.  She is not on a statin but does take fish oil.  Notice some vaginal d/c last night. No itching or irritation.  No pelvic pain.  Last period was in April. Last period before that was in April. No new sexual partners.  Has a new skin lesion on her back.    Review of Systems BP 115/71  Pulse 80  Wt 136 lb (61.689 kg)  BMI 24.28 kg/m2    Allergies  Allergen Reactions  . Codeine Other (See Comments)    REACTION: nausea and dizzy    Past Medical History  Diagnosis Date  . Varicose veins   . PONV (postoperative nausea and vomiting) 02-23-12    always has PONV  . Post-nasal drainage 02-23-12    frequent a problem,causes a cough and mucus buildup in throat  . ADD (attention deficit disorder) 02-23-12    easily distracted.  . Venous insufficiency     chronic-denies any problems  . Arthritis 02-23-12    Rt. hip and lt. arm  . Abnormal Pap smear, atypical squamous cells of undetermined sign (ASC-US) 1998    treated with cryo, CIN I  . Urethral polyp 1998    Dr. Marcello Fennel    Past Surgical History  Procedure Laterality Date  . 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 Hand surgery  02-23-12    "blood clot" excised palm left hand 01-18-12  . Excision/release bursa hip  02/29/12  . Breast surgery  02-23-12    Rt. needle  biopsy, recent -negative  path showed fibroadenoma  . Colonoscopy  08/2003    repeat 5 yrs.  . Colonoscopy w/ biopsies  12/2008    1 polyp recheck 5 yrs  . Knee arthroscopy w/ meniscal repair  10/2012  . Blood clot removed Left 01/2012    blood clot removed from left hand   . Knee arthroscopy with excision baker's cyst Left 10/2012    meniscal repair    History   Social History  . Marital Status: Married    Spouse Name: N/A    Number of Children: N/A  . Years of Education: N/A   Occupational History  . Not on file.   Social History Main Topics  . Smoking status: Never Smoker   . Smokeless tobacco: Not on file  . Alcohol Use: No     Comment: rare occ.  . Drug Use: No  . 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   Other Topics Concern  . Not on file   Social History Narrative  . No narrative on file    Family History  Problem Relation Age of Onset  . Breast cancer  Mother 109    breast  . Hyperlipidemia Mother   . Brain cancer Father 26    brain  . Osteoporosis Maternal Grandmother   . Drug abuse Brother     Outpatient Encounter Prescriptions as of 03/18/2013  Medication Sig Dispense Refill  . amphetamine-dextroamphetamine (ADDERALL XR) 25 MG 24 hr capsule Take 1 capsule (25 mg total) by mouth every morning.  30 capsule  0  . Ascorbic Acid (VITAMIN C) 250 MG CHEW Chew 1 tablet by mouth daily with breakfast. gummie.      . fish oil-omega-3 fatty acids 1000 MG capsule Take 4 g by mouth daily.       Marland Kitchen FLUoxetine (PROZAC) 10 MG tablet Take 10 mg by mouth at bedtime.       . Glucosamine-Chondroit-Vit C-Mn (GLUCOSAMINE 1500 COMPLEX PO) Take 1 tablet by mouth daily.      . meloxicam (MOBIC) 7.5 MG tablet Take 7.5 mg by mouth daily.      . Multiple Vitamin (MULTIVITAMIN) tablet Take 1 tablet by mouth daily.        Marland Kitchen OVER THE COUNTER MEDICATION Take 2 tablets by mouth daily  with breakfast. Vitamin D gummie      . [DISCONTINUED] amphetamine-dextroamphetamine (ADDERALL XR) 25 MG 24 hr capsule Take 1 capsule (25 mg total) by mouth every morning.  30 capsule  0  . [DISCONTINUED] niacin (NIASPAN) 1000 MG CR tablet Take 1,000 mg by mouth at bedtime.         No facility-administered encounter medications on file as of 03/18/2013.          Objective:   Physical Exam  Constitutional: She is oriented to person, place, and time. She appears well-developed and well-nourished.  HENT:  Head: Normocephalic and atraumatic.  Cardiovascular: Normal rate, regular rhythm and normal heart sounds.   Pulmonary/Chest: Effort normal and breath sounds normal.  Neurological: She is alert and oriented to person, place, and time.  Skin: Skin is warm and dry.  Psychiatric: She has a normal mood and affect. Her behavior is normal.    Skin lesion on her back is consistent with an approximately 1 cm oval seborrheic keratosis. In fact the edges it is starting to peel back.      Assessment & Plan:  ADD - Well controlled.  Continue current regimen. Considering stopping med if wants to since she is retiring now. Can use PRN. F/U in 6 months.    Vaginitis - Will do selt wet prep today. Will call with results   Vit D def- recheck in 6 months to make sure comgin upt.   Seborrheic keratosis-gave reassurance. Call if any problems pain, itching or irritation.

## 2013-03-19 LAB — WET PREP, GENITAL
Clue Cells Wet Prep HPF POC: NONE SEEN
Trich, Wet Prep: NONE SEEN
WBC, Wet Prep HPF POC: NONE SEEN
Yeast Wet Prep HPF POC: NONE SEEN

## 2013-04-02 ENCOUNTER — Telehealth: Payer: Self-pay | Admitting: Nurse Practitioner

## 2013-04-02 NOTE — Telephone Encounter (Signed)
Patient has a few questions for a nurse, no details given.

## 2013-04-02 NOTE — Telephone Encounter (Signed)
Left message with a friend of need to return call to our office. sue

## 2013-04-09 ENCOUNTER — Other Ambulatory Visit: Payer: Self-pay | Admitting: Nurse Practitioner

## 2013-04-09 DIAGNOSIS — N939 Abnormal uterine and vaginal bleeding, unspecified: Secondary | ICD-10-CM

## 2013-04-09 NOTE — Telephone Encounter (Signed)
Spoke with pt about scheduling [facility]. Pt would like appt tomorrow afternoon. Sched lab appt 04-10-13 at 2 pm.

## 2013-04-09 NOTE — Telephone Encounter (Signed)
Discussed with Dr. Tresa Res - her last [facility] was 6/ 2012 and was not menopausal at 8.  Now with this cycling every 3 months we would like to repeat [facility] and see if she is menopausal and that would be more important to evaluate this AUB. See if she can come in for labs over next several days - then we can decide on next step.

## 2013-04-09 NOTE — Telephone Encounter (Signed)
Last AEX 01/08/2013 EPIC with Patty Grubb,FNP Patient stated she had a cycle in October/2013 and before coming to appt. With Shirlyn Goltz in April, 2014 States she has had another cycle July 15th, 2014, x 2 days with have to use a panty liner, red to dark bleeding as it tapered off. Stated some slight cramping and backache. Currently on no medication for this. Please advise.

## 2013-04-10 ENCOUNTER — Other Ambulatory Visit (INDEPENDENT_AMBULATORY_CARE_PROVIDER_SITE_OTHER): Payer: BC Managed Care – PPO

## 2013-04-10 DIAGNOSIS — N926 Irregular menstruation, unspecified: Secondary | ICD-10-CM

## 2013-04-10 DIAGNOSIS — N939 Abnormal uterine and vaginal bleeding, unspecified: Secondary | ICD-10-CM

## 2013-04-11 LAB — FOLLICLE STIMULATING HORMONE: FSH: 57.9 m[IU]/mL

## 2013-04-14 ENCOUNTER — Other Ambulatory Visit: Payer: Self-pay | Admitting: Nurse Practitioner

## 2013-04-14 DIAGNOSIS — N926 Irregular menstruation, unspecified: Secondary | ICD-10-CM

## 2013-04-15 ENCOUNTER — Telehealth: Payer: Self-pay | Admitting: *Deleted

## 2013-04-15 NOTE — Telephone Encounter (Signed)
Message copied by Osie Bond on Mon Apr 15, 2013  4:12 PM ------      Message from: Roanna Banning      Created: Sun Apr 14, 2013 12:10 PM       Let patient know plans of proceeding with endo biopsy. Order has been place and she should hear from insurance and nurse about getting done.  thanks      ----- Message -----         From: Alison Murray, MD         Sent: 04/12/2013   1:41 PM           To: Lauro Franklin, FNP            I'd proceed with the endo bx.      ----- Message -----         From: Lauro Franklin, FNP         Sent: 04/11/2013  10:11 PM           To: Alison Murray, MD            Here is [facility] results at 57.9.  Do we proceed with endo biopsy or just wait for another 3 months?             ------

## 2013-04-15 NOTE — Telephone Encounter (Signed)
Pt is aware of the need for an endometrial biopsy. Procedure has been explained to pt, and pt seems excited and ready to proceed. Pt is aware someone else will call to schedule appt.

## 2013-04-18 ENCOUNTER — Telehealth: Payer: Self-pay | Admitting: Nurse Practitioner

## 2013-04-18 NOTE — Telephone Encounter (Signed)
Call to patient to discuss insurance benefits. 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 I said that will be fine.

## 2013-04-19 ENCOUNTER — Other Ambulatory Visit: Payer: Self-pay | Admitting: *Deleted

## 2013-04-19 DIAGNOSIS — F988 Other specified behavioral and emotional disorders with onset usually occurring in childhood and adolescence: Secondary | ICD-10-CM

## 2013-04-19 MED ORDER — AMPHETAMINE-DEXTROAMPHET ER 25 MG PO CP24: 25.0000 mg | ORAL_CAPSULE | ORAL | Status: DC

## 2013-04-23 ENCOUNTER — Ambulatory Visit (INDEPENDENT_AMBULATORY_CARE_PROVIDER_SITE_OTHER): Payer: BC Managed Care – PPO | Admitting: Obstetrics and Gynecology

## 2013-04-23 VITALS — BP 112/66 | HR 84 | Resp 16 | Ht 62.75 in | Wt 134.0 lb

## 2013-04-23 DIAGNOSIS — N926 Irregular menstruation, unspecified: Secondary | ICD-10-CM

## 2013-04-23 NOTE — Patient Instructions (Signed)
We will call you to schedule your ultrasound.  

## 2013-04-23 NOTE — Progress Notes (Signed)
57 yo MWF G0P0 with PMB here for endo bx.  She had two episodes of bleeding recently after no bleeding since Oct 2013 and an [facility] in the menopausal range.  I discussed with the patient the rationale for wanting to evaluate her endometrium, and discussed possible dx for PMB, including cancer.  Pt signed consent form.     Endometrial Biopsy Procedure Note  Indications: postmenopausal bleeding   Pre-operative Diagnosis: PMB  Post-operative Diagnosis: PMB   Procedure Details   The risks (including infection, bleeding, pain, and uterine perforation) and benefits of the procedure were explained to the patient and written informed consent was obtained.    The patient was placed in the dorsal lithotomy position.  Bimanual exam was performed to assess uterine size and position.  A  speculum inserted in the vagina, and the cervix prepped with povidone iodine.  A sharp tenaculum  was applied to the cervix.  Dilation of the cervix was  Necessary. An os finder was used to dilate the external os, but the internal os was quite stenotic and the pt was intolerant of attempts to dilate it.  She was crying and asking me to stop, so I did.        Condition: Stable  Complications: None  Plan:    Discussed options with the patient of D & C vs PUS to measure EMS.  We agree to do the PUS first, and hopefully, the EMS will be under 4 mm.  If it is not, the pt understands that I will rec: a D & C to evaluate the endometrium.  She agrees.     An after visit summary was provided to the patient.

## 2013-04-25 ENCOUNTER — Telehealth: Payer: Self-pay | Admitting: Obstetrics and Gynecology

## 2013-04-25 NOTE — Telephone Encounter (Signed)
LMTCB to discuss insurance benefits and schedule pus for Tuesday 8/12

## 2013-04-30 ENCOUNTER — Ambulatory Visit (INDEPENDENT_AMBULATORY_CARE_PROVIDER_SITE_OTHER): Payer: BC Managed Care – PPO

## 2013-04-30 ENCOUNTER — Ambulatory Visit (INDEPENDENT_AMBULATORY_CARE_PROVIDER_SITE_OTHER): Payer: BC Managed Care – PPO | Admitting: Obstetrics and Gynecology

## 2013-04-30 VITALS — Wt 132.0 lb

## 2013-04-30 DIAGNOSIS — N926 Irregular menstruation, unspecified: Secondary | ICD-10-CM

## 2013-04-30 DIAGNOSIS — D259 Leiomyoma of uterus, unspecified: Secondary | ICD-10-CM

## 2013-04-30 DIAGNOSIS — N950 Postmenopausal bleeding: Secondary | ICD-10-CM

## 2013-04-30 NOTE — Patient Instructions (Signed)
Your ultrasound today is normal.  The bleeding you are having is likely normal from lack of hormone due to menopause.  Call us if your bleeding continues.

## 2013-04-30 NOTE — Progress Notes (Signed)
57 yo MWF G0P0 with two recent episodes of PMB.  Attempt at endo bx unsuccessful d/t stenosis of the internal os and pt discomfort.     With such a small uterus and thin EMS, feel pt needs no further evaluation for this bleeding, which is probably from atrophy.  Discussed findings with patient and her questions were answered.  Rec:  Routine f/u, call for further bleeding.

## 2013-05-21 ENCOUNTER — Other Ambulatory Visit: Payer: Self-pay | Admitting: *Deleted

## 2013-05-21 DIAGNOSIS — F988 Other specified behavioral and emotional disorders with onset usually occurring in childhood and adolescence: Secondary | ICD-10-CM

## 2013-05-21 MED ORDER — AMPHETAMINE-DEXTROAMPHET ER 25 MG PO CP24: 25.0000 mg | ORAL_CAPSULE | ORAL | Status: DC

## 2013-05-29 ENCOUNTER — Telehealth: Payer: Self-pay

## 2013-05-29 ENCOUNTER — Encounter: Payer: Self-pay | Admitting: *Deleted

## 2013-05-29 ENCOUNTER — Ambulatory Visit: Payer: BC Managed Care – PPO | Admitting: Family Medicine

## 2013-05-29 VITALS — BP 118/72 | HR 97 | Wt 133.0 lb

## 2013-05-29 DIAGNOSIS — IMO0001 Reserved for inherently not codable concepts without codable children: Secondary | ICD-10-CM

## 2013-05-29 MED ORDER — TUBERCULIN PPD 5 UNIT/0.1ML ID SOLN
5.0000 [IU] | Freq: Once | INTRADERMAL | Status: DC
  Administered 2013-05-29: 5 [IU] via INTRADERMAL

## 2013-05-29 NOTE — Progress Notes (Signed)
Patient was given 5 units PPD in left forearm.

## 2013-05-29 NOTE — Telephone Encounter (Signed)
Patient came in the office today for TB skin test. She left a form to be filled out for a job and she also needs a letter stating that she takes Adderrall as well as Prozac for a drug test that she has to do. Form is placed in your basket. Rhonda Cunningham,CMA

## 2013-06-03 ENCOUNTER — Ambulatory Visit (INDEPENDENT_AMBULATORY_CARE_PROVIDER_SITE_OTHER): Payer: BC Managed Care – PPO | Admitting: Family Medicine

## 2013-06-03 ENCOUNTER — Encounter: Payer: Self-pay | Admitting: *Deleted

## 2013-06-03 DIAGNOSIS — Z111 Encounter for screening for respiratory tuberculosis: Secondary | ICD-10-CM

## 2013-06-03 NOTE — Progress Notes (Signed)
  Subjective:    Patient ID: Wanda Gonzales, female    DOB: Aug 11, 1956, 57 y.o.   MRN: [id number] Pt came in office needing to get her TB test read that she had done on 05/29/2013.  Pt was supposed to have it read on Friday so placed a new TB skin test on right lower forearm and informed pt to come in on Wednesday to have this read. Barry Dienes, LPN  HPI    Review of Systems     Objective:   Physical Exam        Assessment & Plan:

## 2013-06-05 LAB — TB SKIN TEST
Induration: 0 mm
TB Skin Test: NEGATIVE

## 2013-06-28 ENCOUNTER — Other Ambulatory Visit: Payer: Self-pay

## 2013-06-28 DIAGNOSIS — F988 Other specified behavioral and emotional disorders with onset usually occurring in childhood and adolescence: Secondary | ICD-10-CM

## 2013-06-28 MED ORDER — AMPHETAMINE-DEXTROAMPHET ER 25 MG PO CP24: 25.0000 mg | ORAL_CAPSULE | ORAL | Status: DC

## 2013-06-28 NOTE — Telephone Encounter (Signed)
Refilled her adderall

## 2013-07-25 ENCOUNTER — Other Ambulatory Visit: Payer: Self-pay

## 2013-08-05 ENCOUNTER — Other Ambulatory Visit: Payer: Self-pay | Admitting: Family Medicine

## 2014-01-10 ENCOUNTER — Ambulatory Visit: Payer: BC Managed Care – PPO | Admitting: Nurse Practitioner

## 2014-02-07 ENCOUNTER — Other Ambulatory Visit: Payer: Self-pay | Admitting: Nurse Practitioner

## 2014-02-07 DIAGNOSIS — Z1231 Encounter for screening mammogram for malignant neoplasm of breast: Secondary | ICD-10-CM

## 2014-02-13 ENCOUNTER — Ambulatory Visit (INDEPENDENT_AMBULATORY_CARE_PROVIDER_SITE_OTHER): Payer: BC Managed Care – PPO

## 2014-02-13 DIAGNOSIS — Z1231 Encounter for screening mammogram for malignant neoplasm of breast: Secondary | ICD-10-CM

## 2014-02-17 ENCOUNTER — Ambulatory Visit (INDEPENDENT_AMBULATORY_CARE_PROVIDER_SITE_OTHER): Payer: BC Managed Care – PPO | Admitting: Family Medicine

## 2014-02-17 ENCOUNTER — Encounter: Payer: Self-pay | Admitting: Family Medicine

## 2014-02-17 VITALS — BP 102/64 | HR 77 | Wt 145.0 lb

## 2014-02-17 DIAGNOSIS — M5136 Other intervertebral disc degeneration, lumbar region: Secondary | ICD-10-CM

## 2014-02-17 DIAGNOSIS — M5137 Other intervertebral disc degeneration, lumbosacral region: Secondary | ICD-10-CM

## 2014-02-17 DIAGNOSIS — F988 Other specified behavioral and emotional disorders with onset usually occurring in childhood and adolescence: Secondary | ICD-10-CM

## 2014-02-17 DIAGNOSIS — R635 Abnormal weight gain: Secondary | ICD-10-CM

## 2014-02-17 DIAGNOSIS — M51369 Other intervertebral disc degeneration, lumbar region without mention of lumbar back pain or lower extremity pain: Secondary | ICD-10-CM

## 2014-02-17 MED ORDER — AMPHETAMINE-DEXTROAMPHET ER 20 MG PO CP24: 20.0000 mg | ORAL_CAPSULE | ORAL | Status: DC
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
## 2014-02-24 ENCOUNTER — Telehealth: Payer: Self-pay | Admitting: *Deleted

## 2014-02-24 NOTE — Telephone Encounter (Signed)
Pt states when she seen you that you changed the dose of Adderral and she states she is still grinding teeth and still can't sleep. She states you mentioned that you could change her medication. Please advise.  Oscar La, LPN

## 2014-02-24 NOTE — Telephone Encounter (Signed)
LMOM for pt to call back to let us know if she has tried Concerta before.  Oscar La, LPN

## 2014-02-24 NOTE — Telephone Encounter (Signed)
See if she has tried concerta before?

## 2014-02-25 ENCOUNTER — Ambulatory Visit: Payer: BC Managed Care – PPO | Admitting: Nurse Practitioner

## 2014-02-25 ENCOUNTER — Telehealth: Payer: Self-pay | Admitting: Nurse Practitioner

## 2014-02-25 MED ORDER — METHYLPHENIDATE HCL ER (OSM) 36 MG PO TBCR: 36.0000 mg | EXTENDED_RELEASE_TABLET | Freq: Every day | ORAL | Status: DC

## 2014-02-25 NOTE — Telephone Encounter (Signed)
Will switch to Concerta.  Can pick up new Rx.

## 2014-02-25 NOTE — Telephone Encounter (Signed)
Husband informed to have pt to pick up rx.  Oscar La, LPN
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
## 2014-02-25 NOTE — Telephone Encounter (Signed)
Pt states she has not ever taken Concerta.

## 2014-02-27 ENCOUNTER — Ambulatory Visit (INDEPENDENT_AMBULATORY_CARE_PROVIDER_SITE_OTHER): Payer: BC Managed Care – PPO | Admitting: Nurse Practitioner

## 2014-02-27 ENCOUNTER — Encounter: Payer: Self-pay | Admitting: Nurse Practitioner

## 2014-02-27 VITALS — BP 100/64 | HR 76 | Ht 61.0 in | Wt 143.0 lb

## 2014-02-27 DIAGNOSIS — Z01419 Encounter for gynecological examination (general) (routine) without abnormal findings: Secondary | ICD-10-CM

## 2014-02-27 MED ORDER — FLUOXETINE HCL 10 MG PO TABS: 10.0000 mg | ORAL_TABLET | Freq: Every day | ORAL | Status: DC

## 2014-02-27 NOTE — Patient Instructions (Signed)

## 2014-02-27 NOTE — Progress Notes (Signed)
Patient ID: Wanda Gonzales, female   DOB: 1956/02/28, 58 y.o.   MRN: [id number] 58 y.o. G0P0 Married Caucasian Fe here for annual exam.  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  Low back pain with treatment by chiropractor and neuro surgery since last fall.  Some increase in weight secondary to this. On 04/30/13 had PUS and attempt at endo biopsy. The end result was because she had a thin endo lining just watchful waiting  Then no bleeding until 12/18/13.  Lasted 1 days red and moderate to light. No cramps or PMS.   Patient's last menstrual period was 12/18/2013.          Sexually active: yes  The current method of family planning is none. (pt is aware of BUM if still having cycles)  Exercising: yes Gym/ health club routine includes boot camp 2 times per week!!  Unable to do as often due to back injury in fall of 2014. Smoker: no   Health Maintenance:  Pap: 01/05/2012 Normal with neg HR HPV  MMG: 02/14/14, Bi-Rads 1: negative  Colonoscopy: 12/2008 1 polyp and recheck in 5 years  Is due this year and will schedule in August  BMD:  Never  TDaP: 2007  Labs:  HB:  13.1  Urine:  Negative     reports that she has never smoked. She has never used smokeless tobacco. She reports that she does not drink alcohol or use illicit drugs.  Past Medical History  Diagnosis Date  . Varicose veins   . PONV (postoperative nausea and vomiting) 02-23-12    always has PONV  . Post-nasal drainage 02-23-12    frequent a problem,causes a cough and mucus buildup in throat  . ADD (attention deficit disorder) 02-23-12    easily distracted.  . Venous insufficiency     chronic-denies any problems  . Arthritis 02-23-12    Rt. hip and lt. arm  . Abnormal Pap smear, atypical squamous cells of undetermined sign (ASC-US) 1998    treated with cryo, CIN I  . Urethral polyp 1998    Dr. Hartley Barefoot    Past Surgical History  Procedure Laterality Date  . 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 Hand surgery  02-23-12    "blood clot" excised palm left hand 01-18-12  . Excision/release bursa hip  02/29/12  . Breast surgery  02-23-12    Rt. needle biopsy, recent -negative  path showed fibroadenoma  . Colonoscopy  08/2003    repeat 5 yrs.  . Colonoscopy w/ biopsies  12/2008    1 polyp recheck 5 yrs  . Knee arthroscopy w/ meniscal repair  10/2012  . Blood clot removed Left 01/2012    blood clot removed from left hand   . Knee arthroscopy with excision baker's cyst Left 10/2012    meniscal repair    Current Outpatient Prescriptions  Medication Sig Dispense Refill  . Ascorbic Acid (VITAMIN C) 250 MG CHEW Chew 1 tablet by mouth daily with breakfast. gummie.      . fish oil-omega-3 fatty acids 1000 MG capsule Take 4 g by mouth daily.       . meloxicam (MOBIC) 7.5 MG tablet Take 7.5 mg by mouth as needed.       . methylphenidate 36 MG PO CR tablet Take 1 tablet (36 mg total) by mouth daily.  30 tablet  0  . Multiple Vitamin (MULTIVITAMIN) tablet  Take 1 tablet by mouth daily.       Marland Kitchen OVER THE COUNTER MEDICATION Take 2 tablets by mouth daily with breakfast. Vitamin D gummie      . FLUoxetine (PROZAC) 10 MG tablet Take 1 tablet (10 mg total) by mouth at bedtime.  90 tablet  1   No current facility-administered medications for this visit.    Family History  Problem Relation Age of Onset  . Breast cancer Mother 68    breast  . Hyperlipidemia Mother   . Brain cancer Father 102    brain  . Osteoporosis Maternal Grandmother   . Drug abuse Brother     ROS:  Pertinent items are noted in HPI.  Otherwise, a comprehensive ROS was negative.  Exam:   BP 100/64  Pulse 76  Ht 5\' 1"  (1.549 m)  Wt 143 lb (64.864 kg)  BMI 27.03 kg/m2  LMP 12/18/2013 Height: 5\' 1"  (154.9 cm)  Ht Readings from Last 3 Encounters:  02/27/14 5\' 1"  (1.549 m)  04/23/13 5' 2.75" (1.594 m)  01/08/13 5' 2.75" (1.594 m)    General appearance: alert, cooperative and appears stated  age Head: Normocephalic, without obvious abnormality, atraumatic Neck: no adenopathy, supple, symmetrical, trachea midline and thyroid normal to inspection and palpation Lungs: clear to auscultation bilaterally Breasts: normal appearance, no masses or tenderness Heart: regular rate and rhythm Abdomen: soft, non-tender; no masses,  no organomegaly Extremities: extremities normal, atraumatic, no cyanosis or edema Skin: Skin color, texture, turgor normal. No rashes or lesions Lymph nodes: Cervical, supraclavicular, and axillary nodes normal. No abnormal inguinal nodes palpated Neurologic: Grossly normal   Pelvic: External genitalia:  no lesions              Urethra:  normal appearing urethra with no masses, tenderness or lesions              Bartholin's and Skene's: normal                 Vagina: normal appearing vagina with normal color and discharge, no lesions              Cervix: anteverted              Pap taken: no Bimanual Exam:  Uterus:  normal size, contour, position, consistency, mobility, non-tender              Adnexa: no mass, fullness, tenderness               Rectovaginal: Confirms               Anus:  normal sphincter tone, no lesions  A:  Well Woman with normal exam  Postmenopausal with history of irregular bleeding - last time 12/2013  History of situational depression and anxiety - wants to try taper off Prozac    P:   Reviewed health and wellness pertinent to exam  Pap smear not taken today  Mammogram is due 5/16  She is aware that I will consult with Dr. Quincy Simmonds about further evaluation of PMB - she will only do PUS - no more attempts at endo biopsy  She is on Prozac 10 mg daily - she will go to every other day for 2 weeks, then every 3 days for 2 weeks, then every 4 days for 2 weeks then off if tolerated.  Counseled on breast self exam, mammography screening, menopause, osteoporosis, adequate intake of calcium and vitamin D, diet and exercise, Kegel's  exercises return annually  or prn  An After Visit Summary was printed and given to the patient.

## 2014-02-28 ENCOUNTER — Telehealth: Payer: Self-pay

## 2014-02-28 NOTE — Progress Notes (Signed)
Encounter reviewed by Dr. Josefa Half.  I recommend proceeding with a pelvic ultrasound in the office.  If endometrial samplings is needed, can do dilation and curettage in the OR.

## 2014-02-28 NOTE — Telephone Encounter (Signed)
She reports the rash as being mild. She will call back on Monday with an up date.

## 2014-02-28 NOTE — Telephone Encounter (Signed)
Wanda Gonzales reports hives on her face and neck. The rash is red, raised and itching. She believes the Concerta caused this reaction. Denies shortness of breath of swelling in mouth. Advised patient to discontinue Concerta until otherwise instructed.

## 2014-02-28 NOTE — Telephone Encounter (Signed)
Can use benadryl. Can she come in at 4:15 today. Or If the rash is really mild then we can see if improves over the weekend.

## 2014-03-04 ENCOUNTER — Telehealth: Payer: Self-pay | Admitting: *Deleted

## 2014-03-04 NOTE — Telephone Encounter (Signed)
Pt states she was told to try the Concerta longer. States she has a rash all over body. Please advise.  Oscar La, LPN

## 2014-03-04 NOTE — Telephone Encounter (Signed)
Called pt and lvm that she will need an appt .Wanda Gonzales [place]

## 2014-03-04 NOTE — Telephone Encounter (Signed)
Pt stated that she has not taken the med since last thursday and she has been using benadryl and hydrocortisone cream. She is still itching in spite of all of her efforts.  She states that when she called on Monday the rash had gotten worse and she called triage and the triage line advised not to leave a message on that line and this is why she didn't lv a message. Advised to try oatmeal bath will forward to pcp for further advice.Wanda Gonzales [place]

## 2014-03-04 NOTE — Telephone Encounter (Signed)
Patient called left voicemail that Conserta that was given to her has broken her out and she wants to know if she can have something else called in asap. Thanks

## 2014-03-04 NOTE — Telephone Encounter (Signed)
No, she was actually told to discontinue the Concerta. It is well documented in the chart. Clearly there must have been a misunderstanding. She is to stop it immediately and see if the rash dissipates over the next week.

## 2014-03-04 NOTE — Telephone Encounter (Signed)
Needs appt

## 2014-03-05 NOTE — Telephone Encounter (Addendum)
Pt  Stated that she thinks that she has poison ivy and will go to UC. And she reports that the concerta has caused her to stay up and feels that this will need to be changed. She stated that she will come in and discuss this with Dr. Madilyn Fireman if Dr. Madilyn Fireman would like for her to come back in to be seen.Wanda Gonzales

## 2014-03-09 ENCOUNTER — Emergency Department
Admission: EM | Admit: 2014-03-09 | Discharge: 2014-03-09 | Disposition: A | Discharge status: Home / Self Care | Payer: BC Managed Care – PPO | Source: Home / Self Care | Attending: Family Medicine | Admitting: Family Medicine

## 2014-03-09 ENCOUNTER — Telehealth: Payer: Self-pay | Admitting: Emergency Medicine

## 2014-03-09 ENCOUNTER — Other Ambulatory Visit: Payer: Self-pay | Admitting: Nurse Practitioner

## 2014-03-09 ENCOUNTER — Encounter: Payer: Self-pay | Admitting: Emergency Medicine

## 2014-03-09 DIAGNOSIS — N950 Postmenopausal bleeding: Secondary | ICD-10-CM

## 2014-03-09 DIAGNOSIS — L259 Unspecified contact dermatitis, unspecified cause: Secondary | ICD-10-CM

## 2014-03-09 MED ORDER — CEPHALEXIN 500 MG PO CAPS: 500.0000 mg | ORAL_CAPSULE | Freq: Two times a day (BID) | ORAL | Status: DC

## 2014-03-09 MED ORDER — PREDNISONE 20 MG PO TABS: 20.0000 mg | ORAL_TABLET | Freq: Two times a day (BID) | ORAL | Status: DC

## 2014-03-09 MED ORDER — METHYLPREDNISOLONE SODIUM SUCC 125 MG IJ SOLR
125.0000 mg | Freq: Once | INTRAMUSCULAR | Status: AC
  Administered 2014-03-09: 125 mg via INTRAMUSCULAR

## 2014-03-09 NOTE — Discharge Instructions (Signed)
Begin prednisone on 03/10/14.  May take Benadryl at bedtime for itcing.   Contact Dermatitis Contact dermatitis is a reaction to certain substances that touch the skin. Contact dermatitis can be either irritant contact dermatitis or allergic contact dermatitis. Irritant contact dermatitis does not require previous exposure to the substance for a reaction to occur.Allergic contact dermatitis only occurs if you have been exposed to the substance before. Upon a repeat exposure, your body reacts to the substance.  CAUSES  Many substances can cause contact dermatitis. Irritant dermatitis is most commonly caused by repeated exposure to mildly irritating substances, such as:  Makeup.  Soaps.  Detergents.  Bleaches.  Acids.  Metal salts, such as nickel. Allergic contact dermatitis is most commonly caused by exposure to:  Poisonous plants.  Chemicals (deodorants, shampoos).  Jewelry.  Latex.  Neomycin in triple antibiotic cream.  Preservatives in products, including clothing. SYMPTOMS  The area of skin that is exposed may develop:  Dryness or flaking.  Redness.  Cracks.  Itching.  Pain or a burning sensation.  Blisters. With allergic contact dermatitis, there may also be swelling in areas such as the eyelids, mouth, or genitals.  DIAGNOSIS  Your caregiver can usually tell what the problem is by doing a physical exam. In cases where the cause is uncertain and an allergic contact dermatitis is suspected, a patch skin test may be performed to help determine the cause of your dermatitis. TREATMENT Treatment includes protecting the skin from further contact with the irritating substance by avoiding that substance if possible. Barrier creams, powders, and gloves may be helpful. Your caregiver may also recommend:  Steroid creams or ointments applied 2 times daily. For best results, soak the rash area in cool water for 20 minutes. Then apply the medicine. Cover the area with a  plastic wrap. You can store the steroid cream in the refrigerator for a "chilly" effect on your rash. That may decrease itching. Oral steroid medicines may be needed in more severe cases.  Antibiotics or antibacterial ointments if a skin infection is present.  Antihistamine lotion or an antihistamine taken by mouth to ease itching.  Lubricants to keep moisture in your skin.  Burow's solution to reduce redness and soreness or to dry a weeping rash. Mix one packet or tablet of solution in 2 cups cool water. Dip a clean washcloth in the mixture, wring it out a bit, and put it on the affected area. Leave the cloth in place for 30 minutes. Do this as often as possible throughout the day.  Taking several cornstarch or baking soda baths daily if the area is too large to cover with a washcloth. Harsh chemicals, such as alkalis or acids, can cause skin damage that is like a burn. You should flush your skin for 15 to 20 minutes with cold water after such an exposure. You should also seek immediate medical care after exposure. Bandages (dressings), antibiotics, and pain medicine may be needed for severely irritated skin.  HOME CARE INSTRUCTIONS  Avoid the substance that caused your reaction.  Keep the area of skin that is affected away from hot water, soap, sunlight, chemicals, acidic substances, or anything else that would irritate your skin.  Do not scratch the rash. Scratching may cause the rash to become infected.  You may take cool baths to help stop the itching.  Only take over-the-counter or prescription medicines as directed by your caregiver.  See your caregiver for follow-up care as directed to make sure your skin is healing  properly. SEEK MEDICAL CARE IF:   Your condition is not better after 3 days of treatment.  You seem to be getting worse.  You see signs of infection such as swelling, tenderness, redness, soreness, or warmth in the affected area.  You have any problems related to  your medicines. Document Released: 09/02/2000 Document Revised: 11/28/2011 Document Reviewed: 02/08/2011 [facility] Patient Information 2015 [place], [place]. This information is not intended to replace advice given to you by your health care provider. Make sure you discuss any questions you have with your health care provider.

## 2014-03-09 NOTE — ED Provider Notes (Signed)
CSN: [id number]     Arrival date & time 03/09/14  1350 History   First MD Initiated Contact with Patient 03/09/14 1412     Chief Complaint  Patient presents with  . Rash      HPI Comments: Patient developed a rash on her left face ten days ago.  Additional lesions appeared on trunk, arms, and legs.  The day before she had been outdoors walking with her dog who may have been in poison ivy.  She feels well otherwise.  No fevers, chills, and sweats.  No mouth lesions  Patient is a 58 y.o. female presenting with rash. The history is provided by the patient.  Rash Pain location:  Generalized Pain quality comment:  Itching Pain severity:  Mild Onset quality:  Gradual Duration:  10 days Timing:  Constant Progression:  Worsening Chronicity:  New Relieved by:  Nothing Worsened by:  Nothing tried Ineffective treatments:  OTC medications Associated symptoms: no chills, no diarrhea, no dysuria, no fatigue, no fever, no hematuria, no nausea, no sore throat and no vomiting     Past Medical History  Diagnosis Date  . Varicose veins   . PONV (postoperative nausea and vomiting) 02-23-12    always has PONV  . Post-nasal drainage 02-23-12    frequent a problem,causes a cough and mucus buildup in throat  . ADD (attention deficit disorder) 02-23-12    easily distracted.  . Venous insufficiency     chronic-denies any problems  . Arthritis 02-23-12    Rt. hip and lt. arm  . Abnormal Pap smear, atypical squamous cells of undetermined sign (ASC-US) 1998    treated with cryo, CIN I  . Urethral polyp 1998    Dr. Hartley Barefoot   Past Surgical History  Procedure Laterality Date  . Total hip arthroplasty  age 83    LT, congenital dislocation that caused advanced arthritis  . Hand surgery  02-23-12    "blood clot" excised palm left hand 01-18-12  . Excision/release bursa hip  02/29/12  . Breast surgery  02-23-12    Rt. needle biopsy, recent -negative  path showed fibroadenoma  . Colonoscopy  08/2003    repeat 5  yrs.  . Colonoscopy w/ biopsies  12/2008    1 polyp recheck 5 yrs  . Knee arthroscopy w/ meniscal repair  10/2012  . Blood clot removed Left 01/2012    blood clot removed from left hand   . Knee arthroscopy with excision baker's cyst Left 10/2012    meniscal repair   Family History  Problem Relation Age of Onset  . Breast cancer Mother 79    breast  . Hyperlipidemia Mother   . Brain cancer Father 40    brain  . Osteoporosis Maternal Grandmother   . Drug abuse Brother    History  Substance Use Topics  . Smoking status: Never Smoker   . Smokeless tobacco: Never Used  . Alcohol Use: Yes     Comment: rare occ.   OB History   Grav Para Term Preterm Abortions TAB SAB Ect Mult Living   0              Review of Systems  Constitutional: Negative for fever, chills and fatigue.  HENT: Negative for sore throat.   Gastrointestinal: Negative for nausea, vomiting and diarrhea.  Genitourinary: Negative for dysuria and hematuria.  Skin: Positive for rash.  All other systems reviewed and are negative.   Allergies  Codeine and Concerta  Home Medications  Prior to Admission medications   Medication Sig Start Date End Date Taking? Authorizing Provider  Ascorbic Acid (VITAMIN C) 250 MG CHEW Chew 1 tablet by mouth daily with breakfast. gummie.    Historical Provider, MD  cephALEXin (KEFLEX) 500 MG capsule Take 1 capsule (500 mg total) by mouth 2 (two) times daily. 03/09/14   Kandra Nicolas, MD  fish oil-omega-3 fatty acids 1000 MG capsule Take 4 g by mouth daily.     Historical Provider, MD  FLUoxetine (PROZAC) 10 MG tablet Take 1 tablet (10 mg total) by mouth at bedtime. 02/27/14   Mardene Celeste Rolen-Grubb, FNP  meloxicam (MOBIC) 7.5 MG tablet Take 7.5 mg by mouth as needed.     Historical Provider, MD  Multiple Vitamin (MULTIVITAMIN) tablet Take 1 tablet by mouth daily.     Historical Provider, MD  OVER THE COUNTER MEDICATION Take 2 tablets by mouth daily with breakfast. Vitamin D gummie     Historical Provider, MD  predniSONE (DELTASONE) 20 MG tablet Take 1 tablet (20 mg total) by mouth 2 (two) times daily. Take with food. 03/09/14   Kandra Nicolas, MD   BP 93/63  Pulse 95  Temp(Src) 98.2 F (36.8 C) (Oral)  Ht 5\' 5"  (1.651 m)  Wt 141 lb 12 oz (64.297 kg)  BMI 23.59 kg/m2  SpO2 97%  LMP 12/18/2013 Physical Exam  Nursing note and vitals reviewed. Constitutional: She is oriented to person, place, and time. She appears well-developed and well-nourished. No distress.  HENT:  Head: Normocephalic.    Mouth/Throat: Oropharynx is clear and moist.  Eyes: Conjunctivae are normal. Pupils are equal, round, and reactive to light.  Neck: Neck supple.  Cardiovascular: Normal heart sounds.   Pulmonary/Chest: Breath sounds normal.  Abdominal: Bowel sounds are normal. There is no tenderness.  Lymphadenopathy:    She has no cervical adenopathy.  Neurological: She is alert and oriented to person, place, and time.  Skin: Skin is warm and dry. Rash noted. Rash is maculopapular.     Erythematous maculo-papular lesions on face, trunk, and extremities in distribution as noted on diagram.  Lesions on left anterior thigh have evidence recent vesiculation, suggestive of rhus type dermatitis.  No tenderness, swelling, or warmth.      ED Course  Procedures  none    Labs Reviewed  WOUND CULTURE         MDM   1. Contact dermatitis; probably rhus, from contact with dog.    SoluMedrol 125mg  IM.  Begin prednisone burst tomorrow. Wound culture pending from lesions left anterior thigh.  Begin empiric Keflex. Begin prednisone on 03/10/14.  May take Benadryl at bedtime for itcing. Followup with Family Doctor if not improved in about 4 to 5 days.    Kandra Nicolas, MD 03/09/14 1451

## 2014-03-09 NOTE — ED Notes (Signed)
Rash started on Thursday.  Has spread over most of body including under clothes.  Extreme itching. Has tried hydrocortisone, Ivy Rest,Calamine,and Benadryl at HS

## 2014-03-10 ENCOUNTER — Telehealth: Payer: Self-pay | Admitting: *Deleted

## 2014-03-10 DIAGNOSIS — N950 Postmenopausal bleeding: Secondary | ICD-10-CM

## 2014-03-10 NOTE — Telephone Encounter (Signed)
I spoke to patient to let her know of Dr. Elza Rafter recommendation.  Pt began to voice unwillingness, but phone disconnected while we were talking.  I called pt back, no answer, left message to return call to continue discussing recommendation.

## 2014-03-10 NOTE — Telephone Encounter (Signed)
Message copied by Graylon Good on Mon Mar 10, 2014  2:43 PM ------      Message from: Antonietta Barcelona      Created: Sun Mar 09, 2014  6:29 PM       Can you please call her and let her know that Dr. Quincy Simmonds said OK for  the PUS here in the office to evaluate the PMB.  I will put in order.      ----- Message -----         From: Jamey Reas de Berton Lan, MD         Sent: 02/28/2014   9:04 AM           To: Milford Cage, FNP            Hi Patty,             I recommend doing the pelvic ultrasound in the office.       Any biopsy can be done as a dilation and curettage in the OR if needed.            Thanks,            Josefa Half      ----- Message -----         From: Milford Cage, FNP         Sent: 02/27/2014   6:32 PM           To: Brook E Amundson de Berton Lan, MD            Please look at her paper chart on your desk, and past eval for PMB from last year. Then look at current AEX note.  Please let Olivia Mackie know what you plan if any for this patient.  She is aware that I was going to ask you about this.             ------

## 2014-03-12 ENCOUNTER — Telehealth: Payer: Self-pay | Admitting: *Deleted

## 2014-03-12 ENCOUNTER — Telehealth: Payer: Self-pay | Admitting: Emergency Medicine

## 2014-03-12 LAB — WOUND CULTURE: Gram Stain: NONE SEEN

## 2014-03-13 ENCOUNTER — Ambulatory Visit (INDEPENDENT_AMBULATORY_CARE_PROVIDER_SITE_OTHER): Payer: BC Managed Care – PPO | Admitting: Family Medicine

## 2014-03-13 ENCOUNTER — Encounter: Payer: Self-pay | Admitting: Family Medicine

## 2014-03-13 VITALS — BP 108/68 | HR 92 | Temp 98.7°F

## 2014-03-13 DIAGNOSIS — J069 Acute upper respiratory infection, unspecified: Secondary | ICD-10-CM

## 2014-03-13 DIAGNOSIS — J029 Acute pharyngitis, unspecified: Secondary | ICD-10-CM

## 2014-03-13 DIAGNOSIS — R110 Nausea: Secondary | ICD-10-CM

## 2014-03-13 DIAGNOSIS — T50905A Adverse effect of unspecified drugs, medicaments and biological substances, initial encounter: Secondary | ICD-10-CM

## 2014-03-13 DIAGNOSIS — B958 Unspecified staphylococcus as the cause of diseases classified elsewhere: Secondary | ICD-10-CM

## 2014-03-13 DIAGNOSIS — F988 Other specified behavioral and emotional disorders with onset usually occurring in childhood and adolescence: Secondary | ICD-10-CM

## 2014-03-13 LAB — POCT RAPID STREP A (OFFICE): Rapid Strep A Screen: NEGATIVE

## 2014-03-13 MED ORDER — HYDROCODONE-HOMATROPINE 5-1.5 MG/5ML PO SYRP: 5.0000 mL | ORAL_SOLUTION | Freq: Every evening | ORAL | Status: DC | PRN

## 2014-03-13 MED ORDER — AMPHETAMINE-DEXTROAMPHETAMINE 15 MG PO TABS: 15.0000 mg | ORAL_TABLET | Freq: Every day | ORAL | Status: DC

## 2014-03-13 MED ORDER — BENZONATATE 200 MG PO CAPS: 200.0000 mg | ORAL_CAPSULE | Freq: Three times a day (TID) | ORAL | Status: DC | PRN

## 2014-03-13 NOTE — Progress Notes (Signed)
   Subjective:    Patient ID: Wanda Gonzales, female    DOB: [birth year], 57 y.o.   MRN: [id number]  HPI Here for cough and sore throat, bodyaches, and some back pain.  She also has some sinus congestion irritation of the nose. Some ear pressure. Sick x 4 days. No recent travel. No GI sxs.  She is on doxy for staph infection on her lft leg from poison ivey.  No fevers chills or sweats. She's been on the doxycycline for about 2 days it's upsetting her stomach and constant nausea. She called over to the urgent care to see she can take it with food since the packaging says not to. I told her it would probably be okay. But she wants to make sure. She says the infection on her left thigh is actually looking much better and is starting to dry up. No longer actively draining.  ADD-she did try the Concerta. Unfortunately it did the same thing is the Adderall XR. Affected her sleep at night. So she stopped it. She wants to know what other alternatives or possible.   Review of Systems     Objective:   Physical Exam  Constitutional: She is oriented to person, place, and time. She appears well-developed and well-nourished.  HENT:  Head: Normocephalic and atraumatic.  Right Ear: External ear normal.  Left Ear: External ear normal.  Nose: Nose normal.  Mouth/Throat: Oropharynx is clear and moist.  TMs and canals are clear.   Eyes: Conjunctivae and EOM are normal. Pupils are equal, round, and reactive to light.  Neck: Neck supple. No thyromegaly present.  Cardiovascular: Normal rate, regular rhythm and normal heart sounds.   Pulmonary/Chest: Effort normal and breath sounds normal. She has no wheezes.  Lymphadenopathy:    She has no cervical adenopathy.  Neurological: She is alert and oriented to person, place, and time.  Skin: Skin is warm and dry.  Psychiatric: She has a normal mood and affect.          Assessment & Plan:  Upper respiratory infection-most likely viral. There she is Re: on  doxycycline for a staph infection which should also cover sinusitis and bronchitis. Call if not significantly better in one week.  Nausea secondary to antibiotic usage-it's okay to take the doxycycline with a small amount of food. She still having significant nausea and based on the cultures can change to Bactrim. The wound is actually already looking much better.  Poison ivy-resolving.  ADD-will try short acting Adderall. She's previously been on extended release without any problems or side effects. More recently the extended release started causing insomnia. She says when she doesn't take it she sleeps fine. We'll start with Adderall 15 mg daily. Per prescription given for 30 tabs given. Open one month.

## 2014-03-17 ENCOUNTER — Other Ambulatory Visit: Payer: Self-pay | Admitting: Physician Assistant

## 2014-03-17 ENCOUNTER — Telehealth: Payer: Self-pay | Admitting: *Deleted

## 2014-03-17 ENCOUNTER — Telehealth: Payer: Self-pay | Admitting: Gynecology

## 2014-03-17 MED ORDER — ONDANSETRON HCL 4 MG PO TABS: 4.0000 mg | ORAL_TABLET | Freq: Three times a day (TID) | ORAL | Status: DC | PRN

## 2014-03-17 MED ORDER — SULFAMETHOXAZOLE-TRIMETHOPRIM 800-160 MG PO TABS: 1.0000 | ORAL_TABLET | Freq: Two times a day (BID) | ORAL | Status: DC

## 2014-03-17 NOTE — Telephone Encounter (Signed)
Spoke with patient. Advised that per benefit quote received, she will be responsible for $30 copay when she comes in for PUS. Patient agreeable. Scheduled PUS.  Advised patient of 72 hour cancellation policy and $892 cancellation fee. Patient agreeable.  Mailed the In-Office procedure form that includes appointment date and time, patient copay, and cancellation policy.

## 2014-03-17 NOTE — Telephone Encounter (Signed)
Will send over bactrim. Wound culture back and sensitive to bactrim. Call with any problems. Continue to take with some food all abx can make nauseated. Would she like anti-nausea as well?

## 2014-03-17 NOTE — Telephone Encounter (Signed)
Sent. If nausea not improving please follow up in clinic for further evaluation.

## 2014-03-17 NOTE — Telephone Encounter (Signed)
Called and informed pt she would like something to be called in to help with the nausea.[name]

## 2014-03-17 NOTE — Telephone Encounter (Signed)
Pt advised of recommendation and voiced understanding.Wanda Gonzales

## 2014-03-17 NOTE — Telephone Encounter (Signed)
Pt called and stated that the Doxy that was given for the staph infection is causing her to be extremely nauseated and she d/c'd it. She hasn't taken it since Saturday. She has not thrown up she is only nauseated and she is able to keep hydrated. Pt was very tearful while explaining this to me and would like to know what else she should do. Please advise.Wanda Gonzales [place]

## 2014-03-19 ENCOUNTER — Telehealth: Payer: Self-pay

## 2014-03-19 ENCOUNTER — Encounter: Payer: Self-pay | Admitting: Emergency Medicine

## 2014-03-19 ENCOUNTER — Emergency Department
Admission: EM | Admit: 2014-03-19 | Discharge: 2014-03-19 | Disposition: A | Discharge status: Home / Self Care | Payer: BC Managed Care – PPO | Source: Home / Self Care | Attending: Family Medicine | Admitting: Family Medicine

## 2014-03-19 DIAGNOSIS — R110 Nausea: Secondary | ICD-10-CM

## 2014-03-19 LAB — POCT CBC W AUTO DIFF (K'VILLE URGENT CARE)

## 2014-03-19 NOTE — Telephone Encounter (Signed)
FYI Wanda Gonzales complains of diarrhea and nausea because of the antibiotics. She was given multiple different antibiotics for a wound. I advised patient to go to the urgent care to be evaluated for the need to continue on an antibiotic. There were no openings in Primary Care.

## 2014-03-19 NOTE — ED Notes (Signed)
Pt c/o nausea since being on ABT's and diarrhea x 2 last night. She is taking Bactrim and Zofran. Denies fever. She reports having URI last wk.

## 2014-03-19 NOTE — Discharge Instructions (Signed)
Stop Bactrim DS.  Continue Zofran 4mg  and increase to two tabs every 8 hours as needed for nausea.  Begin clear liquids for about 12 hours, then may begin a BRAT diet (Bananas, Rice, Applesauce, Toast) when nausea resolved.  Then gradually advance to a regular diet as tolerated.  Avoid milk products until well. May add a Probiotic until improved. May apply 1% Hydrocortisone cream to rash three times daily for about 4 to 5 days. If symptoms become significantly worse during the night or over the weekend, proceed to the local emergency room.

## 2014-03-19 NOTE — ED Provider Notes (Signed)
CSN: [id number]     Arrival date & time 03/19/14  1413 History   First MD Initiated Contact with Patient 03/19/14 1512     Chief Complaint  Patient presents with  . Diarrhea  . Nausea     HPI Comments: Patient was recently treated for infected contact dermatitis with doxycycline which subsequently caused nausea.  She has not taken doxycycline for 4 days.  Septra DS was started two days ago, and Zofran 4mg  for nausea.  Yesterday she had two episodes of diarrhea, now resolved.  She continues to have nausea when she takes Septra.  She reports that her rash is almost resolved.  No fevers, chills, and sweats.  She feels well otherwise.  The history is provided by the patient.    Past Medical History  Diagnosis Date  . Varicose veins   . PONV (postoperative nausea and vomiting) 02-23-12    always has PONV  . Post-nasal drainage 02-23-12    frequent a problem,causes a cough and mucus buildup in throat  . ADD (attention deficit disorder) 02-23-12    easily distracted.  . Venous insufficiency     chronic-denies any problems  . Arthritis 02-23-12    Rt. hip and lt. arm  . Abnormal Pap smear, atypical squamous cells of undetermined sign (ASC-US) 1998    treated with cryo, CIN I  . Urethral polyp 1998    Dr. Hartley Barefoot   Past Surgical History  Procedure Laterality Date  . 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 Hand surgery  02-23-12    "blood clot" excised palm left hand 01-18-12  . Excision/release bursa hip  02/29/12  . Breast surgery  02-23-12    Rt. needle biopsy, recent -negative  path showed fibroadenoma  . Colonoscopy  08/2003    repeat 5 yrs.  . Colonoscopy w/ biopsies  12/2008    1 polyp recheck 5 yrs  . Knee arthroscopy w/ meniscal repair  10/2012  . Blood clot removed Left 01/2012    blood clot removed from left hand   . Knee arthroscopy with excision baker's cyst Left 10/2012    meniscal repair   Family History  Problem Relation Age  of Onset  . Breast cancer Mother 79    breast  . Hyperlipidemia Mother   . Brain cancer Father 55    brain  . Osteoporosis Maternal Grandmother   . Drug abuse Brother    History  Substance Use Topics  . Smoking status: Never Smoker   . Smokeless tobacco: Never Used  . Alcohol Use: Yes     Comment: rare occ.   OB History   Grav Para Term Preterm Abortions TAB SAB Ect Mult Living   0              Review of Systems  Constitutional: Positive for appetite change and fatigue. Negative for fever and chills.  HENT: Negative.   Eyes: Negative.   Respiratory: Negative.   Cardiovascular: Negative.   Gastrointestinal: Positive for nausea and diarrhea. Negative for vomiting, abdominal pain, blood in stool and abdominal distention.  Genitourinary: Negative.   Musculoskeletal: Negative.   Skin: Positive for rash.  Neurological: Positive for dizziness. Negative for headaches.    Allergies  Codeine; Doxycycline; and Concerta  Home Medications   Prior to Admission medications   Medication Sig Start Date End Date Taking? Authorizing Provider  amphetamine-dextroamphetamine (ADDERALL) 15 MG tablet Take 1 tablet (15 mg  total) by mouth daily. 03/13/14   Hali Marry, MD  Ascorbic Acid (VITAMIN C) 250 MG CHEW Chew 1 tablet by mouth daily with breakfast. gummie.    Historical Provider, MD  benzonatate (TESSALON) 200 MG capsule Take 1 capsule (200 mg total) by mouth 3 (three) times daily as needed for cough. 03/13/14   Hali Marry, MD  fish oil-omega-3 fatty acids 1000 MG capsule Take 4 g by mouth daily.     Historical Provider, MD  FLUoxetine (PROZAC) 10 MG tablet Take 1 tablet (10 mg total) by mouth at bedtime. 02/27/14   Mardene Celeste Rolen-Grubb, FNP  HYDROcodone-homatropine (HYCODAN) 5-1.5 MG/5ML syrup Take 5 mLs by mouth at bedtime as needed for cough. 03/13/14   Hali Marry, MD  meloxicam (MOBIC) 7.5 MG tablet Take 7.5 mg by mouth as needed.     Historical Provider, MD   Multiple Vitamin (MULTIVITAMIN) tablet Take 1 tablet by mouth daily.     Historical Provider, MD  ondansetron (ZOFRAN) 4 MG tablet Take 1 tablet (4 mg total) by mouth every 8 (eight) hours as needed for nausea or vomiting. 03/17/14   Jade L Breeback, PA-C  OVER THE COUNTER MEDICATION Take 2 tablets by mouth daily with breakfast. Vitamin D gummie    Historical Provider, MD  predniSONE (DELTASONE) 20 MG tablet Take 1 tablet (20 mg total) by mouth 2 (two) times daily. Take with food. 03/09/14   Kandra Nicolas, MD  sulfamethoxazole-trimethoprim (BACTRIM DS,SEPTRA DS) 800-160 MG per tablet Take 1 tablet by mouth 2 (two) times daily. For 7 days. 03/17/14   Jade L Breeback, PA-C   BP 118/78  Pulse 105  Temp(Src) 98.1 F (36.7 C) (Oral)  Resp 18  Wt 139 lb (63.05 kg)  SpO2 97%  LMP 12/18/2013 Physical Exam Nursing notes and Vital Signs reviewed. Appearance:  Patient appears healthy, stated age, and in no acute distress Eyes:  Pupils are equal, round, and reactive to light and accomodation.  Extraocular movement is intact.  Conjunctivae are not inflamed  Ears:  tympanic membranes normal Pharynx:  Normal; moist mucous membranes  Neck:  Supple.  No adenopathy Lungs:  Clear to auscultation.  Breath sounds are equal.  Heart:  Regular rate and rhythm without murmurs, rubs, or gallops.  Abdomen:  Nontender without masses or hepatosplenomegaly.  Bowel sounds are present.  No CVA or flank tenderness.  Extremities:  No edema.  No calf tenderness Skin:  Resolving contact dermatitis (see note dated 03/09/2014).  There is minimal residual erythema without swelling, warmth, tenderness.    ED Course  Procedures  none    Labs Reviewed  POCT CBC W AUTO DIFF ([facility]):  WBC 8.6; LY 42.2; MO 5.1; GR 52.7; Hgb 14.5; Platelets 284          MDM   1. Nausea alone (diarrhea resolved; doubt C. Diff).  Suspect nausea secondary to antibiotics.    Cellulitis appears resolved; antibiotic no longer  necessary.  Stop Bactrim DS.  Continue Zofran 4mg  and increase to two tabs every 8 hours as needed for nausea.  Begin clear liquids for about 12 hours, then may begin a BRAT diet (Bananas, Rice, Applesauce, Toast) when nausea resolved.  Then gradually advance to a regular diet as tolerated.  Avoid milk products until well. May add a Probiotic until improved. May apply 1% Hydrocortisone cream to resolving rash three times daily for about 4 to 5 days. If symptoms become significantly worse during the night or over  the weekend, proceed to the local emergency room.     Kandra Nicolas, MD 03/22/14 1201

## 2014-03-23 ENCOUNTER — Telehealth: Payer: Self-pay

## 2014-03-23 NOTE — ED Notes (Signed)
Left a message on voice mail asking how patient is feeling and advising to call back with any questions or concerns.  

## 2014-03-26 NOTE — Telephone Encounter (Signed)
Pt has appt for ultrasound scheduled.  See phone note of 03/17/14 for more details.  Closing encounter.

## 2014-03-28 ENCOUNTER — Telehealth: Payer: Self-pay | Admitting: Gynecology

## 2014-03-28 NOTE — Telephone Encounter (Signed)
Patient dislocated her hip today while shaving and needs to reschedule her upcoming PUS appointment.

## 2014-03-31 NOTE — Telephone Encounter (Signed)
Patient is calling again about scheduling her PUS

## 2014-04-01 ENCOUNTER — Other Ambulatory Visit: Payer: BC Managed Care – PPO | Admitting: Gynecology

## 2014-04-01 ENCOUNTER — Other Ambulatory Visit: Payer: BC Managed Care – PPO

## 2014-04-01 NOTE — Telephone Encounter (Signed)
Ok, but we need to make sure she comes in b/c PUS is for PMB,

## 2014-04-01 NOTE — Telephone Encounter (Signed)
Patient called to let me know that she is in a brace for the next 6 weeks and would like to schedule PUS for when she is out of it. States that she has an appointment with her orthopedist on Thursday and will have more info. She will call back after her appointment to schedule PUS.

## 2014-04-24 ENCOUNTER — Telehealth: Payer: Self-pay | Admitting: *Deleted

## 2014-04-24 NOTE — Telephone Encounter (Signed)
Pt approved for adderall 04/03/14-04/24/2015.  Case ID # [id number]

## 2014-05-15 ENCOUNTER — Ambulatory Visit (INDEPENDENT_AMBULATORY_CARE_PROVIDER_SITE_OTHER): Payer: BC Managed Care – PPO | Admitting: Sports Medicine

## 2014-05-15 ENCOUNTER — Encounter: Payer: Self-pay | Admitting: Sports Medicine

## 2014-05-15 VITALS — BP 97/63 | HR 90 | Ht 61.0 in | Wt 146.0 lb

## 2014-05-15 DIAGNOSIS — M25511 Pain in right shoulder: Secondary | ICD-10-CM | POA: Insufficient documentation

## 2014-05-15 DIAGNOSIS — M25519 Pain in unspecified shoulder: Secondary | ICD-10-CM | POA: Diagnosis not present

## 2014-05-15 MED ORDER — MELOXICAM 15 MG PO TABS: ORAL_TABLET | ORAL | Status: DC

## 2014-05-15 NOTE — Progress Notes (Signed)
Patient ID: Wanda Gonzales, female   DOB: [birth year], 57 y.o.   MRN: [id number]    Subjective:    I'm seeing this patient as a consultation for: Wanda Renshaw, MD  CC: Right shoulder pain  HPI: Wanda Gonzales is a very pleasant 58 year old female who presents with roughly 4 weeks of constant, non-improving right shoulder pain. The pain is localized to the lateral aspect of the shoulder about the area of the acromion. Also reports a more recent onset of stabbing pain with numbness and tingling originating in the right side of the neck and radiating into the second through fourth fingers on the right hand. She relates that she did dislocate her hip while standing to shave in the shower 6 weeks ago and may have braced herself with her right shoulder at the time; however, remembers no other possible injury. Pain has not been relieved with ice, heat, Advil, or Tylenol. No prior history of issue with this shoulder.  Past medical history, Surgical history, Family history not pertinant except as noted below, Social history, Allergies, and medications have been entered into the medical record, reviewed, and no changes needed.   Review of Systems: No headache, visual changes, nausea, vomiting, diarrhea, constipation, dizziness, abdominal pain, skin rash, fevers, chills, night sweats, weight loss, swollen lymph nodes, body aches, joint swelling, muscle aches, chest pain, shortness of breath, mood changes, visual or auditory hallucinations.   Objective:   General: Well Developed, well nourished, and in no acute distress.  Neuro/Psych: Alert and oriented x3, extra-ocular muscles intact, able to move all 4 extremities, sensation grossly intact. Skin: Warm and dry, no rashes noted.  Respiratory: Not using accessory muscles, speaking in full sentences, trachea midline.  Cardiovascular: Pulses palpable, no extremity edema. Abdomen: Does not appear distended. Right Shoulder: Inspection reveals no abnormalities,  atrophy or asymmetry. Palpation revealed tenderness over AC joint. No tenderness over bicipital groove. ROM is limited with abduction, but full in all over planes. Rotator cuff strength normal throughout. Signs of impingement with strongly positive Neer and Hawkin's tests, and mildly positive empty can sign. Speeds and Yergason's tests normal. No labral pathology noted with negative Obrien's, negative clunk and good stability. Normal scapular function observed. No painful arc and no drop arm sign. No apprehension sign.  Procedure: Real-time Ultrasound Guided Injection of the left subacromial bursa Device: GE Logiq E  Verbal informed consent obtained.  Time-out conducted.  Noted no overlying erythema, induration, or other signs of local infection.  Skin prepped in a sterile fashion.  Local anesthesia: Topical Ethyl chloride.  With sterile technique and under real time ultrasound guidance:  Noted distended subacromial bursa, 1 cc Kenalog 40, 4 cc lidocaine injected easily. Completed without difficulty  Pain immediately resolved suggesting accurate placement of the medication.  Advised to call if fevers/chills, erythema, induration, drainage, or persistent bleeding.  Images permanently stored and available for review in the ultrasound unit.  Impression: Technically successful ultrasound guided injection.  Impression and Recommendations:   This case required medical decision making of moderate complexity.  Right shoulder pain: Subacromial bursitis is suggested in this patient with pain localizable over the acromion and strongly positive Neer and Hawkin's tests. C7 radicular symptoms are likely due to alterations in neck and shoulder posture due to this shoulder pain; we will address this further if the pain fails to improve with treatment of the subacromial bursitis.  - Subacromial injection in office today - Referral to formal PT - Mobic - Follow-up in one month

## 2014-05-15 NOTE — Assessment & Plan Note (Signed)
Subacromial injection as above, she likely does have some secondary C7 radicular symptoms as well. Formal physical therapy, Mobic. Return to see me in one month.

## 2014-05-16 ENCOUNTER — Telehealth: Payer: Self-pay

## 2014-05-16 MED ORDER — PREDNISONE 50 MG PO TABS: ORAL_TABLET | ORAL | Status: DC

## 2014-05-16 NOTE — Telephone Encounter (Signed)
Wanda Gonzales is here today because she is still having shoulder pain and right arm pain that is a 8/10. She states the Mobic is not working. She is having trouble sleeping due to the pain. Her PT has been scheduled for next Wednesday. Could she have another medication for the pain?

## 2014-05-16 NOTE — Telephone Encounter (Signed)
Patient advised.

## 2014-05-16 NOTE — Telephone Encounter (Signed)
Likely a steroid flare, adding prednisone, ice 20 minutes 3-4 times a day.

## 2014-05-21 ENCOUNTER — Ambulatory Visit (INDEPENDENT_AMBULATORY_CARE_PROVIDER_SITE_OTHER): Payer: BC Managed Care – PPO | Admitting: Physical Therapy

## 2014-05-21 DIAGNOSIS — M25519 Pain in unspecified shoulder: Secondary | ICD-10-CM

## 2014-05-21 DIAGNOSIS — M6281 Muscle weakness (generalized): Secondary | ICD-10-CM

## 2014-05-27 ENCOUNTER — Encounter (INDEPENDENT_AMBULATORY_CARE_PROVIDER_SITE_OTHER): Payer: BC Managed Care – PPO | Admitting: Physical Therapy

## 2014-05-27 DIAGNOSIS — M25519 Pain in unspecified shoulder: Secondary | ICD-10-CM

## 2014-05-27 DIAGNOSIS — M6281 Muscle weakness (generalized): Secondary | ICD-10-CM

## 2014-06-02 ENCOUNTER — Encounter (INDEPENDENT_AMBULATORY_CARE_PROVIDER_SITE_OTHER): Payer: BC Managed Care – PPO | Admitting: Physical Therapy

## 2014-06-02 DIAGNOSIS — M25519 Pain in unspecified shoulder: Secondary | ICD-10-CM

## 2014-06-02 DIAGNOSIS — M6281 Muscle weakness (generalized): Secondary | ICD-10-CM

## 2014-06-05 ENCOUNTER — Encounter: Payer: BC Managed Care – PPO | Admitting: Physical Therapy

## 2014-06-09 ENCOUNTER — Encounter (INDEPENDENT_AMBULATORY_CARE_PROVIDER_SITE_OTHER): Payer: BC Managed Care – PPO | Admitting: Physical Therapy

## 2014-06-09 DIAGNOSIS — M6281 Muscle weakness (generalized): Secondary | ICD-10-CM

## 2014-06-09 DIAGNOSIS — M25519 Pain in unspecified shoulder: Secondary | ICD-10-CM

## 2014-06-12 ENCOUNTER — Ambulatory Visit (INDEPENDENT_AMBULATORY_CARE_PROVIDER_SITE_OTHER): Payer: BC Managed Care – PPO | Admitting: Sports Medicine

## 2014-06-12 ENCOUNTER — Encounter (INDEPENDENT_AMBULATORY_CARE_PROVIDER_SITE_OTHER): Payer: BC Managed Care – PPO | Admitting: Physical Therapy

## 2014-06-12 ENCOUNTER — Encounter: Payer: Self-pay | Admitting: Sports Medicine

## 2014-06-12 VITALS — BP 100/66 | HR 82 | Ht 61.0 in | Wt 146.0 lb

## 2014-06-12 DIAGNOSIS — M25519 Pain in unspecified shoulder: Secondary | ICD-10-CM

## 2014-06-12 DIAGNOSIS — M6281 Muscle weakness (generalized): Secondary | ICD-10-CM

## 2014-06-12 DIAGNOSIS — M25511 Pain in right shoulder: Secondary | ICD-10-CM

## 2014-06-12 NOTE — Assessment & Plan Note (Signed)
Improved significantly after subacromial injection despite steroid flare. Future injections can be given with Depo-Medrol rather than triamcinolone. Continue physical therapy as she has not yet plateaued, return in one month. I have also recommended that she use the smart board work so that she is not using the chalkboard or dry erase board overhead.

## 2014-06-12 NOTE — Progress Notes (Signed)
Patient ID: Wanda Gonzales, female   DOB: 05/25/1956, 58 y.o.   MRN: [id number]  Subjective:    CC: F/U on right subacromial bursitis  HPI: Wanda Gonzales is a very pleasant 58 year old female who was seen in our office one month ago (8/27) for right shoulder pain exacerbated by overhead activities that we diagnosed as subacromial bursitis. Subacromial injection was done in our office that day, and she did report worsening pain likely attributable to a steroid flare. She was given prednisone and experienced some insomnia, but her condition improved over the course of 48 hours. She has already seen a 50% improvement in pain and significant improvement in range of motion with formal physical therapy visits and daily Mobic, and feels that she is continuing to improve.  Past medical history, Surgical history, Family history not pertinant except as noted below, Social history, Allergies, and medications have been entered into the medical record, reviewed, and no changes needed.   Review of Systems: No fevers, chills, night sweats, weight loss, chest pain, or shortness of breath.   Objective:    General: Well developed, well nourished, and in no acute distress.  Neuro: Alert and oriented x3, extra-ocular muscles intact, sensation grossly intact.  HEENT: Normocephalic, atraumatic, pupils equal round reactive to light, neck supple, no masses, no lymphadenopathy, thyroid nonpalpable.  Skin: Warm and dry, no rashes. Cardiac: Regular rate and rhythm, no murmurs rubs or gallops, no lower extremity edema.  Respiratory: Clear to auscultation bilaterally. Not using accessory muscles, speaking in full sentences.  Right Shoulder: Inspection reveals no abnormalities, atrophy or asymmetry. Palpation is normal with no tenderness over AC joint or bicipital groove. ROM is full in all planes, with mild pain with abduction. Rotator cuff strength 5/5 with abduction, 4+/5 with internal rotation and external  rotation. Negative Neer and Hawkin's tests, empty can sign. Normal scapular function observed. No painful arc and no drop arm sign. No apprehension sign  Impression and Recommendations:   Right Subacromial Bursitis: Patient is continuing to improve after subacromial injection and with formal physical therapy and daily Mobic. Future injections can be given with Depo-Medrol rather than triamcinolone, in order to avoid a future steroid flare.  - Continue formal PT - Return in one month

## 2014-06-16 ENCOUNTER — Encounter (INDEPENDENT_AMBULATORY_CARE_PROVIDER_SITE_OTHER): Payer: BC Managed Care – PPO | Admitting: Physical Therapy

## 2014-06-16 DIAGNOSIS — M25519 Pain in unspecified shoulder: Secondary | ICD-10-CM

## 2014-06-16 DIAGNOSIS — M6281 Muscle weakness (generalized): Secondary | ICD-10-CM

## 2014-06-18 ENCOUNTER — Encounter: Payer: BC Managed Care – PPO | Admitting: Physical Therapy

## 2014-06-23 ENCOUNTER — Encounter (INDEPENDENT_AMBULATORY_CARE_PROVIDER_SITE_OTHER): Payer: BC Managed Care – PPO

## 2014-06-23 DIAGNOSIS — M6281 Muscle weakness (generalized): Secondary | ICD-10-CM

## 2014-06-23 DIAGNOSIS — M25511 Pain in right shoulder: Secondary | ICD-10-CM

## 2014-06-25 ENCOUNTER — Encounter (INDEPENDENT_AMBULATORY_CARE_PROVIDER_SITE_OTHER): Payer: BC Managed Care – PPO

## 2014-06-25 DIAGNOSIS — M6281 Muscle weakness (generalized): Secondary | ICD-10-CM

## 2014-06-25 DIAGNOSIS — M25511 Pain in right shoulder: Secondary | ICD-10-CM

## 2014-07-03 ENCOUNTER — Encounter (INDEPENDENT_AMBULATORY_CARE_PROVIDER_SITE_OTHER): Payer: BC Managed Care – PPO | Admitting: Physical Therapy

## 2014-07-03 DIAGNOSIS — M6281 Muscle weakness (generalized): Secondary | ICD-10-CM

## 2014-07-03 DIAGNOSIS — M25511 Pain in right shoulder: Secondary | ICD-10-CM

## 2014-07-10 ENCOUNTER — Ambulatory Visit (INDEPENDENT_AMBULATORY_CARE_PROVIDER_SITE_OTHER): Payer: BC Managed Care – PPO | Admitting: Sports Medicine

## 2014-07-10 ENCOUNTER — Encounter: Payer: Self-pay | Admitting: Sports Medicine

## 2014-07-10 VITALS — BP 104/62 | HR 67 | Ht 61.0 in | Wt 144.0 lb

## 2014-07-10 DIAGNOSIS — M25511 Pain in right shoulder: Secondary | ICD-10-CM | POA: Diagnosis not present

## 2014-07-10 DIAGNOSIS — M7061 Trochanteric bursitis, right hip: Secondary | ICD-10-CM

## 2014-07-10 NOTE — Progress Notes (Signed)
  Subjective:    CC: Followup  HPI: Right shoulder impingement syndrome: Now clinically resolved after injection and formal physical therapy.  Past medical history, Surgical history, Family history not pertinant except as noted below, Social history, Allergies, and medications have been entered into the medical record, reviewed, and no changes needed.   Review of Systems: No fevers, chills, night sweats, weight loss, chest pain, or shortness of breath.   Objective:    General: Well Developed, well nourished, and in no acute distress.  Neuro: Alert and oriented x3, extra-ocular muscles intact, sensation grossly intact.  HEENT: Normocephalic, atraumatic, pupils equal round reactive to light, neck supple, no masses, no lymphadenopathy, thyroid nonpalpable.  Skin: Warm and dry, no rashes. Cardiac: Regular rate and rhythm, no murmurs rubs or gallops, no lower extremity edema.  Respiratory: Clear to auscultation bilaterally. Not using accessory muscles, speaking in full sentences. Right Shoulder: Inspection reveals no abnormalities, atrophy or asymmetry. Palpation is normal with no tenderness over AC joint or bicipital groove. ROM is full in all planes. Rotator cuff strength normal throughout. No signs of impingement with negative Neer and Hawkin's tests, empty can. Speeds and Yergason's tests normal. No labral pathology noted with negative Obrien's, negative crank, negative clunk, and good stability. Normal scapular function observed. No painful arc and no drop arm sign. No apprehension sign  Impression and Recommendations:

## 2014-07-10 NOTE — Assessment & Plan Note (Signed)
Resolved with physical therapy and injection. Return as needed.

## 2014-07-27 ENCOUNTER — Other Ambulatory Visit: Payer: Self-pay | Admitting: Nurse Practitioner

## 2014-07-28 NOTE — Telephone Encounter (Signed)
11/9 lmtcb ? How she is currently taking this med, was going to taper down and then off.//kn

## 2014-09-29 ENCOUNTER — Telehealth: Payer: Self-pay | Admitting: Nurse Practitioner

## 2014-09-29 ENCOUNTER — Telehealth: Payer: Self-pay | Admitting: Obstetrics and Gynecology

## 2014-09-29 NOTE — Telephone Encounter (Signed)
Erroneous encounter

## 2014-09-29 NOTE — Telephone Encounter (Signed)
Do you think she should have PUS now and try to get her in or wait until she is here at [facility] and talk with her about why she needs this.  Don't know anything else in regards about any further PMB.  Her previous PUS was normal wit a thin endo lining.

## 2014-09-29 NOTE — Telephone Encounter (Signed)
This patient was supposed to return for a PUS appointment with Dr.Lathrop. Patient has not called to scheduled, I routed the original telephone call to [place] before I noticed she has an appointment with you 03/03/15 [facility]. Does she still need PUS appointment?

## 2014-09-29 NOTE — Telephone Encounter (Signed)
No appointment scheduled for PUS.

## 2014-09-30 NOTE — Telephone Encounter (Signed)
Triage,   Please contact patient to understand what her status is with respect to her postmenopausal bleeding.  On chart review, patient had bleeding in April 2015. If she has not had any further bleeding since that time, I recommend that she have her annual examination in June with me as scheduled.  If she had had any bleeding or spotting since April 2015, I recommend office ultrasound.  No EMB planned as this had been difficult to do in the past.   Cc- [address]

## 2014-09-30 NOTE — Telephone Encounter (Signed)
Spoke with patient. Patient states that she has not had any further bleeding since OV in April 2015. Advised patient okay to wait until June for aex with Dr.Silva. Advised if she experiences any further bleeding or spotting before then will need to call to be seen for evaluation. Patient is agreeable and verbalizes understanding.  Cc: Milford Cage, FNP   Routing to provider for final review. Patient agreeable to disposition. Will close encounter

## 2014-09-30 NOTE — Telephone Encounter (Signed)
Spoke with patient. 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

## 2014-10-08 NOTE — Telephone Encounter (Signed)
See additional phone note.  Ok to close encounter.

## 2014-10-14 ENCOUNTER — Other Ambulatory Visit: Payer: Self-pay | Admitting: Nurse Practitioner

## 2014-10-14 NOTE — Telephone Encounter (Signed)
Medication refill request: Prozac 10 mg  Last [facility]:  02/27/14 with Ms. Patty Next [facility]: 03/03/15 with Ms. Patty Last [facility]G (if hormonal medication request): N/A Refill authorized: #[id number] rfs, please advise.

## 2014-10-24 ENCOUNTER — Ambulatory Visit (INDEPENDENT_AMBULATORY_CARE_PROVIDER_SITE_OTHER): Payer: BC Managed Care – PPO | Admitting: Sports Medicine

## 2014-10-24 ENCOUNTER — Encounter: Payer: Self-pay | Admitting: Sports Medicine

## 2014-10-24 VITALS — BP 102/66 | HR 76 | Ht 61.0 in | Wt 150.0 lb

## 2014-10-24 DIAGNOSIS — M25511 Pain in right shoulder: Secondary | ICD-10-CM | POA: Diagnosis not present

## 2014-10-24 NOTE — Assessment & Plan Note (Signed)
Six-month response to injection, she did have a steroid flare with triamcinolone so we used Depo-Medrol today. Return in a month to see how things are going.

## 2014-10-24 NOTE — Progress Notes (Signed)
  Subjective:    CC: Right shoulder pain  HPI: This is a very pleasant 59 year old female, approximately 6 months ago we did a subacromial injection, she had a steroid flare but subsequently did extremely well. She has been doing some exercises, taking meloxicam, unfortunately her shoulder has just now started to hurt again, moderate, persistent, over the deltoid, worse with overhead activities, and she does desire repeat interventional treatment.  Past medical history, Surgical history, Family history not pertinant except as noted below, Social history, Allergies, and medications have been entered into the medical record, reviewed, and no changes needed.   Review of Systems: No fevers, chills, night sweats, weight loss, chest pain, or shortness of breath.   Objective:    General: Well Developed, well nourished, and in no acute distress.  Neuro: Alert and oriented x3, extra-ocular muscles intact, sensation grossly intact.  HEENT: Normocephalic, atraumatic, pupils equal round reactive to light, neck supple, no masses, no lymphadenopathy, thyroid nonpalpable.  Skin: Warm and dry, no rashes. Cardiac: Regular rate and rhythm, no murmurs rubs or gallops, no lower extremity edema.  Respiratory: Clear to auscultation bilaterally. Not using accessory muscles, speaking in full sentences.  Procedure: Real-time Ultrasound Guided Injection of right subacromial bursa Device: GE Logiq E  Verbal informed consent obtained.  Time-out conducted.  Noted no overlying erythema, induration, or other signs of local infection.  Skin prepped in a sterile fashion.  Local anesthesia: Topical Ethyl chloride.  With sterile technique and under real time ultrasound guidance: 1 mL Depo-Medrol 40, 4 mL lidocaine injected easily into the subacromial bursa.  Completed without difficulty  Pain immediately resolved suggesting accurate placement of the medication.  Advised to call if fevers/chills, erythema, induration,  drainage, or persistent bleeding.  Images permanently stored and available for review in the ultrasound unit.  Impression: Technically successful ultrasound guided injection.  Impression and Recommendations:

## 2014-11-21 ENCOUNTER — Ambulatory Visit (INDEPENDENT_AMBULATORY_CARE_PROVIDER_SITE_OTHER): Payer: BC Managed Care – PPO | Admitting: Sports Medicine

## 2014-11-21 ENCOUNTER — Encounter: Payer: Self-pay | Admitting: Sports Medicine

## 2014-11-21 VITALS — BP 89/62 | HR 82 | Ht 61.0 in | Wt 149.0 lb

## 2014-11-21 DIAGNOSIS — S161XXA Strain of muscle, fascia and tendon at neck level, initial encounter: Secondary | ICD-10-CM | POA: Insufficient documentation

## 2014-11-21 DIAGNOSIS — M25511 Pain in right shoulder: Secondary | ICD-10-CM

## 2014-11-21 NOTE — Assessment & Plan Note (Signed)
Essentially resolved with recent Depo-Medrol subacromial injection. She does get some pain with axial pressure on her elbow. Meloxicam and tramadol seems to work okay, she is happy with how things are going and does not desire any kind of surgical intervention.

## 2014-11-21 NOTE — Assessment & Plan Note (Signed)
Home rehabilitation techniques, return as needed.

## 2014-11-21 NOTE — Progress Notes (Signed)
  Subjective:    CC: right shoulder pain  HPI: Patient presents for follow up regarding her right shoulder after receiving an injection of depo-medrol 1 month ago. She reports that her pain in almost completely resolved now. She has been able to perform overhead activities as she desires without limitation.  She now complains of a new pain at the top of her right shoulder which she associated with having carried heavy luggage 1 week ago. The pain is 4/10 today but seems to be improving over time. The pain is not worsened with any particular activity, is improved with meloxicam, and does not radiate. She denies any numbness or weakness in her right arm.  Past medical history, Surgical history, Family history not pertinant except as noted below, Social history, Allergies, and medications have been entered into the medical record, reviewed, and no changes needed.   Review of Systems: No fevers, chills, night sweats, weight loss, chest pain, or shortness of breath.   Objective:    General: Well Developed, well nourished, and in no acute distress.  Neuro: Alert and oriented x3, extra-ocular muscles intact, sensation grossly intact.  HEENT: Normocephalic, atraumatic, pupils equal round reactive to light, neck supple, no masses, no lymphadenopathy, thyroid nonpalpable.  Skin: Warm and dry, no rashes. Cardiac: Regular rate and rhythm, no murmurs rubs or gallops, no lower extremity edema.  Respiratory: Clear to auscultation bilaterally. Not using accessory muscles, speaking in full sentences. MSK: Right Shoulder: Inspection reveals no abnormalities, atrophy or asymmetry. The superior aspect of the shoulder is mildly tender to deep palpation. There is no tenderness over AC joint or bicipital groove. ROM is full in all planes. Signs of mild impingement with mildly positive Hawkins test, but negative Neer test and negative empty can sign. Normal scapular function observed.   Impression and  Recommendations:    # Right Shoulder Impingement Syndrome - Patient's symptoms almost completely resolved with the injection of depo-medrol 1 month ago - Patient to continue home exercises  - Patient to continue meloxicam 15 mg prn  # Right Cervical Strain - Patient's pain is already improving without intervention - She was instructed to perform cervical stretching techniques at home - Cervical traction technique demonstrated - Patient to continue meloxicam 15 mg prn   Follow up as needed

## 2014-11-26 ENCOUNTER — Encounter: Payer: Self-pay | Admitting: Family Medicine

## 2014-12-22 ENCOUNTER — Ambulatory Visit (INDEPENDENT_AMBULATORY_CARE_PROVIDER_SITE_OTHER): Payer: BC Managed Care – PPO

## 2014-12-22 ENCOUNTER — Encounter: Payer: Self-pay | Admitting: Sports Medicine

## 2014-12-22 ENCOUNTER — Ambulatory Visit (INDEPENDENT_AMBULATORY_CARE_PROVIDER_SITE_OTHER): Payer: BC Managed Care – PPO | Admitting: Sports Medicine

## 2014-12-22 DIAGNOSIS — M25511 Pain in right shoulder: Secondary | ICD-10-CM

## 2014-12-22 DIAGNOSIS — M85811 Other specified disorders of bone density and structure, right shoulder: Secondary | ICD-10-CM | POA: Diagnosis not present

## 2014-12-22 DIAGNOSIS — M8580 Other specified disorders of bone density and structure, unspecified site: Secondary | ICD-10-CM

## 2014-12-22 MED ORDER — TRAMADOL HCL 50 MG PO TABS
ORAL_TABLET | ORAL | Status: DC
Start: 2014-12-22 — End: 2015-04-20

## 2014-12-22 NOTE — Assessment & Plan Note (Signed)
Unfortunately pain has returned 2 months after her previous injection. She has done physical therapy, we are giving her another injection today, and at this point I think she does become a candidate for operative shoulder arthroscopy. X-rays today. Considering her failure of conservative measures it is possible that she will be taken to the operating room without an MRI, I will leave this to the discretion of the surgeon.  Refilling tramadol.

## 2014-12-22 NOTE — Progress Notes (Signed)
  Subjective:    CC: follow-up  HPI: [place] returns, she has had right shoulder impingement syndrome that is unfortunately failed physical therapy, we recently did a subacromial injection with Kenalog, she developed a steroid flare so we did another one with Depo-Medrol which did well. Unfortunately 2 months later she is having recurrence of pain. Moderate, persistent, localized over the deltoid and worse with abduction.  Past medical history, Surgical history, Family history not pertinant except as noted below, Social history, Allergies, and medications have been entered into the medical record, reviewed, and no changes needed.   Review of Systems: No fevers, chills, night sweats, weight loss, chest pain, or shortness of breath.   Objective:    General: Well Developed, well nourished, and in no acute distress.  Neuro: Alert and oriented x3, extra-ocular muscles intact, sensation grossly intact.  HEENT: Normocephalic, atraumatic, pupils equal round reactive to light, neck supple, no masses, no lymphadenopathy, thyroid nonpalpable.  Skin: Warm and dry, no rashes. Cardiac: Regular rate and rhythm, no murmurs rubs or gallops, no lower extremity edema.  Respiratory: Clear to auscultation bilaterally. Not using accessory muscles, speaking in full sentences. Right Shoulder: Inspection reveals no abnormalities, atrophy or asymmetry. Palpation is normal with no tenderness over AC joint or bicipital groove. ROM is full in all planes. Rotator cuff strength strength weak to abduction with a positive Neer's, Hawkins, and empty can signs. Speeds and Yergason's tests normal. No labral pathology noted with negative Obrien's, negative crank, negative clunk, and good stability. Normal scapular function observed. No painful arc and no drop arm sign. No apprehension sign  Procedure: Real-time Ultrasound Guided Injection of subacromial bursa on the right Device: GE Logiq E  Verbal informed consent  obtained.  Time-out conducted.  Noted no overlying erythema, induration, or other signs of local infection.  Skin prepped in a sterile fashion.  Local anesthesia: Topical Ethyl chloride.  With sterile technique and under real time ultrasound guidance:  1 mL Depo-Medrol 40, 3 mL lidocaine injected easily. Completed without difficulty  Pain immediately resolved suggesting accurate placement of the medication.  Advised to call if fevers/chills, erythema, induration, drainage, or persistent bleeding.  Images permanently stored and available for review in the ultrasound unit.  Impression: Technically successful ultrasound guided injection.  Impression and Recommendations:

## 2015-01-07 ENCOUNTER — Ambulatory Visit (INDEPENDENT_AMBULATORY_CARE_PROVIDER_SITE_OTHER): Payer: BC Managed Care – PPO

## 2015-01-07 DIAGNOSIS — M859 Disorder of bone density and structure, unspecified: Secondary | ICD-10-CM

## 2015-02-18 HISTORY — PX: SHOULDER SURGERY: SHX246

## 2015-03-03 ENCOUNTER — Ambulatory Visit: Payer: BC Managed Care – PPO | Admitting: Nurse Practitioner

## 2015-03-03 ENCOUNTER — Ambulatory Visit (INDEPENDENT_AMBULATORY_CARE_PROVIDER_SITE_OTHER): Payer: BC Managed Care – PPO | Admitting: Physical Therapy

## 2015-03-03 ENCOUNTER — Encounter: Payer: Self-pay | Admitting: Physical Therapy

## 2015-03-03 DIAGNOSIS — M25411 Effusion, right shoulder: Secondary | ICD-10-CM

## 2015-03-03 DIAGNOSIS — M25611 Stiffness of right shoulder, not elsewhere classified: Secondary | ICD-10-CM

## 2015-03-03 DIAGNOSIS — M542 Cervicalgia: Secondary | ICD-10-CM

## 2015-03-03 DIAGNOSIS — M25511 Pain in right shoulder: Secondary | ICD-10-CM | POA: Diagnosis not present

## 2015-03-03 DIAGNOSIS — R29898 Other symptoms and signs involving the musculoskeletal system: Secondary | ICD-10-CM

## 2015-03-03 DIAGNOSIS — M436 Torticollis: Secondary | ICD-10-CM

## 2015-03-03 NOTE — Therapy (Signed)
[place], [place], [address] Phone: [phone]   Fax:  [phone]  Physical Therapy Evaluation  Patient Details  Name: Wanda Gonzales MRN: [id number] Date of Birth: 06-27-1956 Referring Provider:  Justice Britain, MD  Encounter Date: 03/03/2015      PT End of Session - 03/03/15 1134    Visit Number 1   Number of Visits (p) 16   Date for PT Re-Evaluation (p) 04/28/15   PT Start Time 1100   PT Stop Time 1150   PT Time Calculation (min) 50 min   Activity Tolerance Patient limited by pain      Past Medical History  Diagnosis Date  . Varicose veins   . PONV (postoperative nausea and vomiting) 02-23-12    always has PONV  . Post-nasal drainage 02-23-12    frequent a problem,causes a cough and mucus buildup in throat  . ADD (attention deficit disorder) 02-23-12    easily distracted.  . Venous insufficiency     chronic-denies any problems  . Arthritis 02-23-12    Rt. hip and lt. arm  . Abnormal Pap smear, atypical squamous cells of undetermined sign (ASC-US) 1998    treated with cryo, CIN I  . Urethral polyp 1998    Dr. Hartley Barefoot    Past Surgical History  Procedure Laterality Date  . 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 Hand surgery  02-23-12    "blood clot" excised palm left hand 01-18-12  . Excision/release bursa hip  02/29/12  . Breast surgery  02-23-12    Rt. needle biopsy, recent -negative  path showed fibroadenoma  . Colonoscopy  08/2003    repeat 5 yrs.  . Colonoscopy w/ biopsies  12/2008    1 polyp recheck 5 yrs  . Knee arthroscopy w/ meniscal repair  10/2012  . Blood clot removed Left 01/2012    blood clot removed from left hand   . Knee arthroscopy with excision baker's cyst Left 10/2012    meniscal repair    There were no vitals filed for this visit.  Visit Diagnosis:  Pain, neck - Plan: PT plan of care cert/re-cert  Pain in  joint, shoulder region, right - Plan: PT plan of care cert/re-cert  Stiffness of neck - Plan: PT plan of care cert/re-cert  Stiffness of shoulder joint, right - Plan: PT plan of care cert/re-cert  Weakness of shoulder - Plan: PT plan of care cert/re-cert  Swelling of shoulder joint, right - Plan: PT plan of care cert/re-cert      Subjective Assessment - 03/03/15 1105    Subjective Pt elective Rt shoulder surgery 02/24/15, has a sling to wear PRN, using ice 3-4 times a day at home. Pt is very anxious and tearful about moving her arm.    Pertinent History performing HEP from MD pendulumns and walk climbs.    How long can you sit comfortably? no limitations   How long can you walk comfortably? doing ok   Patient Stated Goals drive again, use her Rt UE, bath, dresa and fix her hair. Get back to everyday activities.    Currently in Pain? Yes   Pain Score 10-Worst pain ever   Pain Location Shoulder   Pain Descriptors / Indicators Aching;Throbbing;Sore   Pain Type Surgical pain   Pain Radiating Towards up into her neck and down into deltiod   Pain Frequency Constant   Aggravating  Factors  trying to move the arm   Pain Relieving Factors ice and rest            [facility] PT Assessment - 03/03/15 0001    Assessment   Medical Diagnosis Rt shoulder scope   Onset Date/Surgical Date 02/24/15   Hand Dominance Right   Next MD Visit 03/04/15   Prior Therapy none   Precautions   Precautions --  sling PRN, shoulder surgery precautions    Required Braces or Orthoses Sling   Balance Screen   Has the patient fallen in the past 6 months Yes   How many times? --  multiple    Has the patient had a decrease in activity level because of a fear of falling?  No   Is the patient reluctant to leave their home because of a fear of falling?  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   Leisure water aerobics, teach summer school   Observation/Other Assessments   Focus on Therapeutic Outcomes (FOTO)  61% limited   Posture/Postural Control   Posture/Postural Control Postural limitations   Postural Limitations Rounded Shoulders;Forward head;Increased thoracic kyphosis   Posture Comments pt in a guarding position    ROM / Strength   AROM / PROM / Strength AROM;PROM;Strength   AROM   Overall AROM Comments --  elbow and wrist Rt WNL   AROM Assessment Site Cervical;Shoulder   Cervical Flexion --  WNL   Cervical Extension --  WNL   Cervical - Right Rotation 60   Cervical - Left Rotation 32   PROM   PROM Assessment Site Shoulder   Right/Left Shoulder Right   Right Shoulder Flexion 125 Degrees   Right Shoulder ABduction 50 Degrees   Right Shoulder Internal Rotation 75 Degrees   Right Shoulder External Rotation 22 Degrees   Palpation   Palpation comment very tight in Rt upper trap and levator, hypersensitive around Rt shoulder. Steristrips in place                   [facility] Adult PT Treatment/Exercise - 03/03/15 0001    Exercises   Exercises Shoulder   Shoulder Exercises: Supine   Other Supine Exercises AAROM with cane in supine, flex, ER and overhead   Shoulder Exercises: Standing   Other Standing Exercises shoulder shrugs and circles   Modalities   Modalities Cryotherapy;Moist Heat   Moist Heat Therapy   Number Minutes Moist Heat 15 Minutes   Moist Heat Location Cervical   Cryotherapy   Number Minutes Cryotherapy 15 Minutes   Cryotherapy Location Shoulder  Rt   Type of Cryotherapy Ice pack                PT Education - 03/03/15 1133    Education provided Yes   Education Details HEP    Person(s) Educated Patient   Methods Explanation;Handout   Comprehension Verbalized understanding;Returned demonstration          PT Short Term Goals - 03/03/15 1210    PT SHORT TERM GOAL #1   Title I with initial HEP    Time 4   Period Weeks   Status New   PT SHORT TERM GOAL #2   Title report neck pain decrease =/> 75% to ease head turning with driving   Time 4   Period Weeks  Status New   PT SHORT TERM GOAL #3   Title demo Rt shoulder ROM WFL   Time 4   Period Weeks   Status New   PT SHORT TERM GOAL #4   Title improve FOTO =/< 40%   Time 4   Period Weeks   Status New           PT Long Term Goals - 03/03/15 1214    PT LONG TERM GOAL #1   Title I with advanced HEP   Time 8   Period Weeks   Status New   PT LONG TERM GOAL #2   Title Report pain decrease to allow her perform overhead activities   Time 8   Period Weeks   Status New   PT LONG TERM GOAL #3   Title demo Rt shoulder strength =/> 5-/5   Time 8   Period Weeks   Status New   PT LONG TERM GOAL #4   Title demo cervical rotation =/> 60 degrees in each direction   Time 8   Period Weeks   Status New   PT LONG TERM GOAL #5   Title improve FOTO =/< 38% limited   Time 8   Period Weeks   Status New               Plan - 03/03/15 1217    Clinical Impression Statement Pt s/p elective Rt shoulder surgery for decompression.  She presents in a lot of pain, with decreased motion in her neck and shoulder and decreased strength. She also has postural changes that need to be worked on to protect her shoulder.    Pt will benefit from skilled therapeutic intervention in order to improve on the following deficits Decreased range of motion;Impaired UE functional use;Pain;Postural dysfunction;Decreased strength   Rehab Potential Good   PT Frequency 2x / week   PT Duration 8 weeks   PT Treatment/Interventions ADLs/Self Care Home Management;Cryotherapy;Electrical Stimulation;Iontophoresis 4mg /ml Dexamethasone;Ultrasound;Moist Heat;Therapeutic exercise;Manual techniques;Patient/family education   PT Next Visit Plan progress A/PROM in Rt shoulder, add in isometrics   Consulted and Agree with Plan of Care Patient         Problem  List Patient Active Problem List   Diagnosis Date Noted  . Cervical strain 11/21/2014  . Right shoulder pain 05/15/2014  . DDD (degenerative disc disease), lumbar 02/17/2014  . Baker's cyst 03/18/2013  . Unspecified vitamin D deficiency 03/18/2013  . Trochanteric bursitis of right hip 02/29/2012  . INSOMNIA 04/13/2010  . HYPERLIPIDEMIA 09/17/2007  . CERVICAL POLYP 09/17/2007  . ADD 09/11/2007  . UNSPECIFIED VENOUS INSUFFICIENCY 08/29/2007    Manuela Schwartz shaver, PT 03/03/2015, 12:25 PM  [facility] [place], [place], [address] Phone: [phone]   Fax:  [phone]

## 2015-03-03 NOTE — Patient Instructions (Addendum)
Shoulder Press   Press both shoulders down. Hold _5__ seconds. Repeat _10__ times. Two times a day Copyright  VHI. All rights reserved.   AAROM with cane in supine and shoulder sling exercise per handouts

## 2015-03-06 ENCOUNTER — Ambulatory Visit (INDEPENDENT_AMBULATORY_CARE_PROVIDER_SITE_OTHER): Payer: BC Managed Care – PPO | Admitting: Physical Therapy

## 2015-03-06 DIAGNOSIS — M436 Torticollis: Secondary | ICD-10-CM

## 2015-03-06 DIAGNOSIS — M542 Cervicalgia: Secondary | ICD-10-CM

## 2015-03-06 DIAGNOSIS — M25511 Pain in right shoulder: Secondary | ICD-10-CM | POA: Diagnosis not present

## 2015-03-06 DIAGNOSIS — R29898 Other symptoms and signs involving the musculoskeletal system: Secondary | ICD-10-CM | POA: Diagnosis not present

## 2015-03-06 DIAGNOSIS — M25411 Effusion, right shoulder: Secondary | ICD-10-CM | POA: Diagnosis not present

## 2015-03-06 DIAGNOSIS — M25611 Stiffness of right shoulder, not elsewhere classified: Secondary | ICD-10-CM

## 2015-03-06 NOTE — Therapy (Addendum)
[place], [place], [address] Phone: [phone]   Fax:  [phone]  Physical Therapy Treatment  Patient Details  Name: Wanda Gonzales MRN: [id number] Date of Birth: 1956-02-08 Referring Provider:  Justice Britain, MD  Encounter Date: 03/06/2015      PT End of Session - 03/06/15 1107    Visit Number 2   Number of Visits 16   Date for PT Re-Evaluation 04/28/15   PT Start Time 6962   PT Stop Time 9528   PT Time Calculation (min) 51 min   Activity Tolerance Patient limited by pain      Past Medical History  Diagnosis Date  . Varicose veins   . PONV (postoperative nausea and vomiting) 02-23-12    always has PONV  . Post-nasal drainage 02-23-12    frequent a problem,causes a cough and mucus buildup in throat  . ADD (attention deficit disorder) 02-23-12    easily distracted.  . Venous insufficiency     chronic-denies any problems  . Arthritis 02-23-12    Rt. hip and lt. arm  . Abnormal Pap smear, atypical squamous cells of undetermined sign (ASC-US) 1998    treated with cryo, CIN I  . Urethral polyp 1998    Dr. Hartley Barefoot    Past Surgical History  Procedure Laterality Date  . 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 Hand surgery  02-23-12    "blood clot" excised palm left hand 01-18-12  . Excision/release bursa hip  02/29/12  . Breast surgery  02-23-12    Rt. needle biopsy, recent -negative  path showed fibroadenoma  . Colonoscopy  08/2003    repeat 5 yrs.  . Colonoscopy w/ biopsies  12/2008    1 polyp recheck 5 yrs  . Knee arthroscopy w/ meniscal repair  10/2012  . Blood clot removed Left 01/2012    blood clot removed from left hand   . Knee arthroscopy with excision baker's cyst Left 10/2012    meniscal repair    There were no vitals filed for this visit.  Visit Diagnosis:  Pain in joint, shoulder region, right  Stiffness of shoulder joint,  right  Swelling of shoulder joint, right  Weakness of shoulder  Stiffness of neck  Pain, neck      Subjective Assessment - 03/06/15 1109    Subjective Pt reports she is taking muscle relaxer at night to improve sleep.    Currently in Pain? Yes   Pain Score 8    Pain Location Shoulder   Pain Orientation Right   Pain Descriptors / Indicators Aching   Aggravating Factors  moving arm    Pain Relieving Factors ice and rest             [facility] PT Assessment - 03/06/15 0001    Assessment   Medical Diagnosis Rt shoulder scope, SAD, DCR   Onset Date/Surgical Date 02/24/15   Next MD Visit 04/01/15                     [facility] Adult PT Treatment/Exercise - 03/06/15 0001    Exercises   Exercises Shoulder   Shoulder Exercises: Supine   External Rotation AAROM;Right;10 reps   Flexion AAROM;Right;5 reps   Shoulder Exercises: Pulleys   Flexion 3 minutes   ABduction 2 minutes   Shoulder Exercises: Isometric Strengthening   Flexion --  5 sec x 10 reps  Extension --  5 sec x 10 reps   External Rotation --  5 sec x 10 reps   Internal Rotation --  5 sec x 10 reps   ABduction --  unable to due to pain with incisions   Modalities   Modalities Electrical Stimulation;Vasopneumatic   Electrical Stimulation   Electrical Stimulation Location Rt shoulder   Electrical Stimulation Action IFC   Electrical Stimulation Parameters to tolerance x 15 min    Electrical Stimulation Goals Pain   Vasopneumatic   Number Minutes Vasopneumatic  15 minutes   Vasopnuematic Location  Shoulder   Vasopneumatic Pressure Low   Vasopneumatic Temperature  3*                PT Education - 03/06/15 1314    Education provided Yes   Education Details HEP - pt issued shoulder isometrics in Flex, ext, IR, ER 5-10 sec each, 10 reps, 1 time per day to start.    Person(s) Educated Patient   Methods Handout;Explanation   Comprehension Verbalized understanding;Returned demonstration           PT Short Term Goals - 03/03/15 1210    PT SHORT TERM GOAL #1   Title I with initial HEP   Time 4   Period Weeks   Status New   PT SHORT TERM GOAL #2   Title report neck pain decrease =/> 75% to ease head turning with driving   Time 4   Period Weeks   Status New   PT SHORT TERM GOAL #3   Title demo Rt shoulder ROM WFL   Time 4   Period Weeks   Status New   PT SHORT TERM GOAL #4   Title improve FOTO =/< 40%   Time 4   Period Weeks   Status New           PT Long Term Goals - 03/03/15 1214    PT LONG TERM GOAL #1   Title I with advanced HEP   Time 8   Period Weeks   Status New   PT LONG TERM GOAL #2   Title Report pain decrease to allow her perform overhead activities   Time 8   Period Weeks   Status New   PT LONG TERM GOAL #3   Title demo Rt shoulder strength =/> 5-/5   Time 8   Period Weeks   Status New   PT LONG TERM GOAL #4   Title demo cervical rotation =/> 60 degrees in each direction   Time 8   Period Weeks   Status New   PT LONG TERM GOAL #5   Title improve FOTO =/< 38% limited   Time 8   Period Weeks   Status New               Plan - 03/06/15 1146    Clinical Impression Statement Pt limited by pain with ther ex today; educated on working in painfree range and submax contraction for isometrics.  Pt reported reduced pain to minimum with use of vaso/estim. No goals met, only second visit.    Pt will benefit from skilled therapeutic intervention in order to improve on the following deficits Decreased range of motion;Impaired UE functional use;Pain;Postural dysfunction;Decreased strength   Rehab Potential Good   PT Frequency 2x / week   PT Duration 8 weeks   PT Treatment/Interventions ADLs/Self Care Home Management;Cryotherapy;Electrical Stimulation;Iontophoresis 80m/ml Dexamethasone;Ultrasound;Moist Heat;Therapeutic exercise;Manual techniques;Patient/family education   PT Next Visit Plan Continue to  progress ROM and strengthening per  shoulder decompression protocol.    Consulted and Agree with Plan of Care Patient        Problem List Patient Active Problem List   Diagnosis Date Noted  . Cervical strain 11/21/2014  . Right shoulder pain 05/15/2014  . DDD (degenerative disc disease), lumbar 02/17/2014  . Baker's cyst 03/18/2013  . Unspecified vitamin D deficiency 03/18/2013  . Trochanteric bursitis of right hip 02/29/2012  . INSOMNIA 04/13/2010  . HYPERLIPIDEMIA 09/17/2007  . CERVICAL POLYP 09/17/2007  . ADD 09/11/2007  . UNSPECIFIED VENOUS INSUFFICIENCY 08/29/2007    Shelbie Hutching 03/06/2015, 1:15 PM  [facility] [place], [place], [address] Phone: [phone]   Fax:  [phone]

## 2015-03-11 ENCOUNTER — Ambulatory Visit (INDEPENDENT_AMBULATORY_CARE_PROVIDER_SITE_OTHER): Payer: BC Managed Care – PPO | Admitting: Physical Therapy

## 2015-03-11 ENCOUNTER — Encounter: Payer: Self-pay | Admitting: Physical Therapy

## 2015-03-11 DIAGNOSIS — M25611 Stiffness of right shoulder, not elsewhere classified: Secondary | ICD-10-CM

## 2015-03-11 DIAGNOSIS — M25411 Effusion, right shoulder: Secondary | ICD-10-CM | POA: Diagnosis not present

## 2015-03-11 DIAGNOSIS — M25511 Pain in right shoulder: Secondary | ICD-10-CM | POA: Diagnosis not present

## 2015-03-11 DIAGNOSIS — M436 Torticollis: Secondary | ICD-10-CM

## 2015-03-11 DIAGNOSIS — M542 Cervicalgia: Secondary | ICD-10-CM

## 2015-03-11 NOTE — Therapy (Signed)
[place], [place], [address] Phone: [phone]   Fax:  [phone]  Physical Therapy Treatment  Patient Details  Name: Wanda Gonzales MRN: [id number] Date of Birth: 30-Dec-1955 Referring Provider:  Justice Britain, MD  Encounter Date: 03/11/2015      PT End of Session - 03/11/15 1519    Visit Number 3   Number of Visits 16   Date for PT Re-Evaluation 04/28/15   PT Start Time 5427   PT Stop Time 1532   PT Time Calculation (min) 60 min   Activity Tolerance Patient limited by pain      Past Medical History  Diagnosis Date  . Varicose veins   . PONV (postoperative nausea and vomiting) 02-23-12    always has PONV  . Post-nasal drainage 02-23-12    frequent a problem,causes a cough and mucus buildup in throat  . ADD (attention deficit disorder) 02-23-12    easily distracted.  . Venous insufficiency     chronic-denies any problems  . Arthritis 02-23-12    Rt. hip and lt. arm  . Abnormal Pap smear, atypical squamous cells of undetermined sign (ASC-US) 1998    treated with cryo, CIN I  . Urethral polyp 1998    Dr. Hartley Barefoot    Past Surgical History  Procedure Laterality Date  . 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 Hand surgery  02-23-12    "blood clot" excised palm left hand 01-18-12  . Excision/release bursa hip  02/29/12  . Breast surgery  02-23-12    Rt. needle biopsy, recent -negative  path showed fibroadenoma  . Colonoscopy  08/2003    repeat 5 yrs.  . Colonoscopy w/ biopsies  12/2008    1 polyp recheck 5 yrs  . Knee arthroscopy w/ meniscal repair  10/2012  . Blood clot removed Left 01/2012    blood clot removed from left hand   . Knee arthroscopy with excision baker's cyst Left 10/2012    meniscal repair    There were no vitals filed for this visit.  Visit Diagnosis:  Pain in joint, shoulder region, right  Stiffness of shoulder joint,  right  Swelling of shoulder joint, right  Stiffness of neck  Pain, neck      Subjective Assessment - 03/11/15 1441    Subjective Pt reports hearing a pop in her upper Rt shoulder and had extreme pain last night.  She used ice/heat to settle it down.    Patient Stated Goals drive again, use her Rt UE, bath, dresa and fix her hair. Get back to everyday activities.    Pain Score 8    Pain Location Shoulder   Pain Orientation Right   Pain Descriptors / Indicators Aching   Pain Type Surgical pain   Aggravating Factors  moving arm shoulder   Pain Relieving Factors ice and rest            [facility] PT Assessment - 03/11/15 0001    PROM   PROM Assessment Site Shoulder  Rt   Right/Left Shoulder Right   Right Shoulder Flexion 161 Degrees   Right Shoulder ABduction 115 Degrees   Right Shoulder Internal Rotation 104 Degrees   Right Shoulder External Rotation 58 Degrees                     [facility] Adult PT Treatment/Exercise - 03/11/15 0001  Shoulder Exercises: Supine   Other Supine Exercises shoulder presses x 20 with arms abducted 90 degrees.    Other Supine Exercises scapular depressin x 20    Shoulder Exercises: Standing   Retraction Both;20 reps  leaning on wall with pool noodle behind back   Other Standing Exercises shoulder elevation with back on wall    Electrical Stimulation   Electrical Stimulation Location Rt shoulder   Electrical Stimulation Action IFC   Electrical Stimulation Parameters to tolerance   Electrical Stimulation Goals Pain   Vasopneumatic   Number Minutes Vasopneumatic  15 minutes   Vasopnuematic Location  Shoulder   Vasopneumatic Pressure Medium   Vasopneumatic Temperature  3*   Manual Therapy   Manual Therapy Soft tissue mobilization;Myofascial release   Manual therapy comments STW to Rt scalenes, upper trap, anterior deltoid and long head of the biceps. stretching Rt side of the neck.    Soft tissue mobilization PROM Rt shoulder                   PT Short Term Goals - 03/11/15 1632    PT SHORT TERM GOAL #1   Title I with initial HEP   Status Achieved   PT SHORT TERM GOAL #2   Title report neck pain decrease =/> 75% to ease head turning with driving   Status On-going   PT SHORT TERM GOAL #3   Title demo Rt shoulder ROM WFL   Status On-going   PT SHORT TERM GOAL #4   Title improve FOTO =/< 40%   Status On-going           PT Long Term Goals - 03/11/15 1632    PT LONG TERM GOAL #1   Title I with advanced HEP   Status On-going   PT LONG TERM GOAL #2   Title Report pain decrease to allow her perform overhead activities   Status On-going   PT LONG TERM GOAL #3   Title demo Rt shoulder strength =/> 5-/5   Status On-going   PT LONG TERM GOAL #4   Title demo cervical rotation =/> 60 degrees in each direction   Status On-going   PT LONG TERM GOAL #5   Title improve FOTO =/< 38% limited   Status On-going               Plan - 03/11/15 1630    Clinical Impression Statement Pt was doing better until episode last night with increased pain, upon treatment and measurements she has significant increase in Rt shoulder ROM in all directions.  Muscle spasms around Rt shoulder and neck are limitting her ability to perform some tasks.     Pt will benefit from skilled therapeutic intervention in order to improve on the following deficits Decreased range of motion;Impaired UE functional use;Pain;Postural dysfunction;Decreased strength   Rehab Potential Good   PT Frequency 2x / week   PT Duration 8 weeks   PT Treatment/Interventions ADLs/Self Care Home Management;Cryotherapy;Electrical Stimulation;Iontophoresis 4mg /ml Dexamethasone;Ultrasound;Moist Heat;Therapeutic exercise;Manual techniques;Patient/family education   PT Next Visit Plan postural correction exercise, STW if needed   Consulted and Agree with Plan of Care Patient        Problem List Patient Active Problem List   Diagnosis Date Noted   . Cervical strain 11/21/2014  . Right shoulder pain 05/15/2014  . DDD (degenerative disc disease), lumbar 02/17/2014  . Baker's cyst 03/18/2013  . Unspecified vitamin D deficiency 03/18/2013  . Trochanteric bursitis of right hip 02/29/2012  .  INSOMNIA 04/13/2010  . HYPERLIPIDEMIA 09/17/2007  . CERVICAL POLYP 09/17/2007  . ADD 09/11/2007  . UNSPECIFIED VENOUS INSUFFICIENCY 08/29/2007    Jeral Pinch, PT 03/11/2015, 4:33 PM  [facility] [place], [place], [address] Phone: [phone]   Fax:  [phone]

## 2015-03-13 ENCOUNTER — Ambulatory Visit (INDEPENDENT_AMBULATORY_CARE_PROVIDER_SITE_OTHER): Payer: BC Managed Care – PPO | Admitting: Physical Therapy

## 2015-03-13 DIAGNOSIS — M25411 Effusion, right shoulder: Secondary | ICD-10-CM

## 2015-03-13 DIAGNOSIS — M25511 Pain in right shoulder: Secondary | ICD-10-CM

## 2015-03-13 DIAGNOSIS — R29898 Other symptoms and signs involving the musculoskeletal system: Secondary | ICD-10-CM

## 2015-03-13 DIAGNOSIS — M436 Torticollis: Secondary | ICD-10-CM | POA: Diagnosis not present

## 2015-03-13 DIAGNOSIS — M542 Cervicalgia: Secondary | ICD-10-CM

## 2015-03-13 DIAGNOSIS — M25611 Stiffness of right shoulder, not elsewhere classified: Secondary | ICD-10-CM

## 2015-03-13 NOTE — Therapy (Signed)
[place], [place], [address] Phone: [phone]   Fax:  [phone]  Physical Therapy Treatment  Patient Details  Name: Wanda Gonzales MRN: [id number] Date of Birth: 03/29/1956 Referring Provider:  Hali Marry, *  Encounter Date: 03/13/2015      PT End of Session - 03/13/15 1404    Visit Number 4   Number of Visits 16   Date for PT Re-Evaluation 04/28/15   PT Start Time 1400   PT Stop Time 1508   PT Time Calculation (min) 68 min   Activity Tolerance Patient limited by pain      Past Medical History  Diagnosis Date  . Varicose veins   . PONV (postoperative nausea and vomiting) 02-23-12    always has PONV  . Post-nasal drainage 02-23-12    frequent a problem,causes a cough and mucus buildup in throat  . ADD (attention deficit disorder) 02-23-12    easily distracted.  . Venous insufficiency     chronic-denies any problems  . Arthritis 02-23-12    Rt. hip and lt. arm  . Abnormal Pap smear, atypical squamous cells of undetermined sign (ASC-US) 1998    treated with cryo, CIN I  . Urethral polyp 1998    Dr. Hartley Barefoot    Past Surgical History  Procedure Laterality Date  . 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 Hand surgery  02-23-12    "blood clot" excised palm left hand 01-18-12  . Excision/release bursa hip  02/29/12  . Breast surgery  02-23-12    Rt. needle biopsy, recent -negative  path showed fibroadenoma  . Colonoscopy  08/2003    repeat 5 yrs.  . Colonoscopy w/ biopsies  12/2008    1 polyp recheck 5 yrs  . Knee arthroscopy w/ meniscal repair  10/2012  . Blood clot removed Left 01/2012    blood clot removed from left hand   . Knee arthroscopy with excision baker's cyst Left 10/2012    meniscal repair    There were no vitals filed for this visit.  Visit Diagnosis:  Pain in joint, shoulder region, right  Stiffness of shoulder  joint, right  Swelling of shoulder joint, right  Stiffness of neck  Pain, neck  Weakness of shoulder      Subjective Assessment - 03/13/15 1405    Subjective Pt reports she used mom's TENs unit last night, and has been icing on and off throughout day for pain relief.  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    Currently in Pain? Yes   Pain Score 6    Pain Location Shoulder   Pain Orientation Anterior;Right   Pain Descriptors / Indicators Aching;Throbbing   Aggravating Factors  moving arm   Pain Relieving Factors ice/ rest.             [facility] PT Assessment - 03/13/15 0001    Assessment   Medical Diagnosis Rt shoulder scope, SAD,   Onset Date/Surgical Date 02/24/15   Next MD Visit 04/01/15                     [facility] Adult PT Treatment/Exercise - 03/13/15 0001    Exercises   Exercises Shoulder   Shoulder Exercises: Supine   External Rotation AAROM;Right;12 reps   Flexion AAROM;Right;10 reps  with cane   Other Supine Exercises shoulder presses (AAROM with cane)x 10  with arms abducted 45 degrees.    Other Supine Exercises thoracic lifts with 3 sec hold x 8; shoulder presses with 3 sec hold x 10.    Shoulder Exercises: Prone   Extension Right;10 reps  2 sets; VC for scap retraction   Other Prone Exercises Prone row R shoulder x 10 x 2 sets    Shoulder Exercises: Pulleys   Flexion 2 minutes   Flexion Limitations Pt d/c due to increased pain   Modalities   Modalities Vasopneumatic;Electrical Stimulation   Electrical Stimulation   Electrical Stimulation Location Rt shoulder   Electrical Stimulation Action IFC   Electrical Stimulation Parameters to tolerance x 15 min    Electrical Stimulation Goals Pain   Vasopneumatic   Number Minutes Vasopneumatic  15 minutes   Vasopnuematic Location  Shoulder   Vasopneumatic Pressure Medium   Vasopneumatic Temperature  3*   Manual Therapy   Manual Therapy Myofascial release;Taping;Passive ROM   Myofascial  Release to Rt scalenes, platysma, pec major, upper trap, levator   Pt hypersensitive in all areas.    Passive ROM Rt shoulder into ABD, ER, flexion (gentle) - guarding    Kinesiotex Inhibit Muscle  decrease sensitivity, applied to Rt pec minor                  PT Short Term Goals - 03/11/15 1632    PT SHORT TERM GOAL #1   Title I with initial HEP   Status Achieved   PT SHORT TERM GOAL #2   Title report neck pain decrease =/> 75% to ease head turning with driving   Status On-going   PT SHORT TERM GOAL #3   Title demo Rt shoulder ROM WFL   Status On-going   PT SHORT TERM GOAL #4   Title improve FOTO =/< 40%   Status On-going           PT Long Term Goals - 03/11/15 1632    PT LONG TERM GOAL #1   Title I with advanced HEP   Status On-going   PT LONG TERM GOAL #2   Title Report pain decrease to allow her perform overhead activities   Status On-going   PT LONG TERM GOAL #3   Title demo Rt shoulder strength =/> 5-/5   Status On-going   PT LONG TERM GOAL #4   Title demo cervical rotation =/> 60 degrees in each direction   Status On-going   PT LONG TERM GOAL #5   Title improve FOTO =/< 38% limited   Status On-going               Plan - 03/13/15 1631    Clinical Impression Statement Pt tolerated ther ex with minor increase in pain, except pulleys.   Pt continues to guard with PROM secondary to pain.  Pt very point tender in entire Rt shoulder girdle and scalenes; applied trial of kinesiotape to Rt pec to decrease sensitivity. Pt reported decrease in symptoms with use of estim and vaso.     Pt will benefit from skilled therapeutic intervention in order to improve on the following deficits Decreased range of motion;Impaired UE functional use;Pain;Postural dysfunction;Decreased strength   Rehab Potential Good   PT Frequency 2x / week   PT Duration 8 weeks   PT Treatment/Interventions ADLs/Self Care Home Management;Cryotherapy;Electrical  Stimulation;Iontophoresis 4mg /ml Dexamethasone;Ultrasound;Moist Heat;Therapeutic exercise;Manual techniques;Patient/family education   PT Next Visit Plan Continue progressive ROM and strengthening per protocol. Modalites and STW as needed.    Consulted  and Agree with Plan of Care Patient        Problem List Patient Active Problem List   Diagnosis Date Noted  . Cervical strain 11/21/2014  . Right shoulder pain 05/15/2014  . DDD (degenerative disc disease), lumbar 02/17/2014  . Baker's cyst 03/18/2013  . Unspecified vitamin D deficiency 03/18/2013  . Trochanteric bursitis of right hip 02/29/2012  . INSOMNIA 04/13/2010  . HYPERLIPIDEMIA 09/17/2007  . CERVICAL POLYP 09/17/2007  . ADD 09/11/2007  . UNSPECIFIED VENOUS INSUFFICIENCY 08/29/2007   Kerin Perna, PTA 03/13/2015 4:38 PM  [place], [place], [address] Phone: [phone]   Fax:  [phone]

## 2015-03-16 ENCOUNTER — Ambulatory Visit (INDEPENDENT_AMBULATORY_CARE_PROVIDER_SITE_OTHER): Payer: BC Managed Care – PPO | Admitting: Physical Therapy

## 2015-03-16 DIAGNOSIS — M436 Torticollis: Secondary | ICD-10-CM

## 2015-03-16 DIAGNOSIS — M542 Cervicalgia: Secondary | ICD-10-CM

## 2015-03-16 DIAGNOSIS — M25411 Effusion, right shoulder: Secondary | ICD-10-CM | POA: Diagnosis not present

## 2015-03-16 DIAGNOSIS — M25511 Pain in right shoulder: Secondary | ICD-10-CM

## 2015-03-16 DIAGNOSIS — M25611 Stiffness of right shoulder, not elsewhere classified: Secondary | ICD-10-CM | POA: Diagnosis not present

## 2015-03-16 DIAGNOSIS — R29898 Other symptoms and signs involving the musculoskeletal system: Secondary | ICD-10-CM

## 2015-03-16 NOTE — Therapy (Signed)
[place], [place], [address] Phone: [phone]   Fax:  [phone]  Physical Therapy Treatment  Patient Details  Name: Wanda Gonzales MRN: [id number] Date of Birth: 04-10-1956 Referring Provider:  Justice Britain, MD  Encounter Date: 03/16/2015      PT End of Session - 03/16/15 1429    Visit Number 5   Number of Visits 16   Date for PT Re-Evaluation 04/28/15   PT Start Time 1401   PT Stop Time 1445   PT Time Calculation (min) 44 min   Activity Tolerance Patient limited by pain   Behavior During Therapy [facility] for tasks assessed/performed      Past Medical History  Diagnosis Date  . Varicose veins   . PONV (postoperative nausea and vomiting) 02-23-12    always has PONV  . Post-nasal drainage 02-23-12    frequent a problem,causes a cough and mucus buildup in throat  . ADD (attention deficit disorder) 02-23-12    easily distracted.  . Venous insufficiency     chronic-denies any problems  . Arthritis 02-23-12    Rt. hip and lt. arm  . Abnormal Pap smear, atypical squamous cells of undetermined sign (ASC-US) 1998    treated with cryo, CIN I  . Urethral polyp 1998    Dr. Hartley Barefoot    Past Surgical History  Procedure Laterality Date  . 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 Hand surgery  02-23-12    "blood clot" excised palm left hand 01-18-12  . Excision/release bursa hip  02/29/12  . Breast surgery  02-23-12    Rt. needle biopsy, recent -negative  path showed fibroadenoma  . Colonoscopy  08/2003    repeat 5 yrs.  . Colonoscopy w/ biopsies  12/2008    1 polyp recheck 5 yrs  . Knee arthroscopy w/ meniscal repair  10/2012  . Blood clot removed Left 01/2012    blood clot removed from left hand   . Knee arthroscopy with excision baker's cyst Left 10/2012    meniscal repair    There were no vitals filed for this visit.  Visit Diagnosis:  Pain in  joint, shoulder region, right  Stiffness of shoulder joint, right  Swelling of shoulder joint, right  Stiffness of neck  Pain, neck  Weakness of shoulder      Subjective Assessment - 03/16/15 1406    Subjective Reports shoulder is feeling "a little better." Still using TENs and ice.     Patient Stated Goals drive again, use her Rt UE, bath, dresa and fix her hair. Get back to everyday activities.    Currently in Pain? Yes   Pain Score 6    Pain Location Shoulder   Pain Orientation Right;Anterior   Pain Descriptors / Indicators Aching;Throbbing   Pain Frequency Constant   Aggravating Factors  movement   Pain Relieving Factors ice/rest                         [facility] Adult PT Treatment/Exercise - 03/16/15 1407    Shoulder Exercises: Supine   External Rotation AAROM;Right;20 reps   External Rotation Limitations with cane   Flexion AAROM;AROM;10 reps   Flexion Limitations x10 with cane; x10 active   Other Supine Exercises shoulder presses (AAROM with cane)2 x 10 with arms abducted 45 degrees.    Shoulder Exercises: Pulleys   Flexion 3  minutes   ABduction 3 minutes   Cryotherapy   Number Minutes Cryotherapy 15 Minutes   Cryotherapy Location Shoulder   Type of Cryotherapy Ice pack   Manual Therapy   Manual Therapy Passive ROM   Passive ROM Rt shoulder into ABD, ER, IR; decreased guarding today.                  PT Short Term Goals - 03/11/15 1632    PT SHORT TERM GOAL #1   Title I with initial HEP   Status Achieved   PT SHORT TERM GOAL #2   Title report neck pain decrease =/> 75% to ease head turning with driving   Status On-going   PT SHORT TERM GOAL #3   Title demo Rt shoulder ROM WFL   Status On-going   PT SHORT TERM GOAL #4   Title improve FOTO =/< 40%   Status On-going           PT Long Term Goals - 03/11/15 1632    PT LONG TERM GOAL #1   Title I with advanced HEP   Status On-going   PT LONG TERM GOAL #2   Title Report  pain decrease to allow her perform overhead activities   Status On-going   PT LONG TERM GOAL #3   Title demo Rt shoulder strength =/> 5-/5   Status On-going   PT LONG TERM GOAL #4   Title demo cervical rotation =/> 60 degrees in each direction   Status On-going   PT LONG TERM GOAL #5   Title improve FOTO =/< 38% limited   Status On-going               Plan - 03/16/15 1429    Clinical Impression Statement Pt with active shoulder flexion almost back to WNL.  Still has pain with ER and abdct.  Will continue to benefit from PT to maximize function.   PT Next Visit Plan Continue progressive ROM and strengthening per protocol. Modalites and STW as needed.    Consulted and Agree with Plan of Care Patient        Problem List Patient Active Problem List   Diagnosis Date Noted  . Cervical strain 11/21/2014  . Right shoulder pain 05/15/2014  . DDD (degenerative disc disease), lumbar 02/17/2014  . Baker's cyst 03/18/2013  . Unspecified vitamin D deficiency 03/18/2013  . Trochanteric bursitis of right hip 02/29/2012  . INSOMNIA 04/13/2010  . HYPERLIPIDEMIA 09/17/2007  . CERVICAL POLYP 09/17/2007  . ADD 09/11/2007  . UNSPECIFIED VENOUS INSUFFICIENCY 08/29/2007   Laureen Abrahams, PT, DPT 03/16/2015 2:45 PM  [facility] [place], [place], [address] Phone: [phone]   Fax:  [phone]

## 2015-03-18 ENCOUNTER — Ambulatory Visit (INDEPENDENT_AMBULATORY_CARE_PROVIDER_SITE_OTHER): Payer: BC Managed Care – PPO | Admitting: Physical Therapy

## 2015-03-18 DIAGNOSIS — R29898 Other symptoms and signs involving the musculoskeletal system: Secondary | ICD-10-CM

## 2015-03-18 DIAGNOSIS — M25611 Stiffness of right shoulder, not elsewhere classified: Secondary | ICD-10-CM

## 2015-03-18 DIAGNOSIS — M436 Torticollis: Secondary | ICD-10-CM | POA: Diagnosis not present

## 2015-03-18 DIAGNOSIS — M25411 Effusion, right shoulder: Secondary | ICD-10-CM | POA: Diagnosis not present

## 2015-03-18 DIAGNOSIS — M25511 Pain in right shoulder: Secondary | ICD-10-CM

## 2015-03-18 DIAGNOSIS — M542 Cervicalgia: Secondary | ICD-10-CM

## 2015-03-18 NOTE — Therapy (Signed)
[place], [place], [address] Phone: [phone]   Fax:  [phone]  Physical Therapy Treatment  Patient Details  Name: Wanda Gonzales MRN: [id number] Date of Birth: 12-Jan-1956 Referring Provider:  Justice Britain, MD  Encounter Date: 03/18/2015      PT End of Session - 03/18/15 1718    Visit Number 6   Number of Visits 16   Date for PT Re-Evaluation 04/28/15   PT Start Time 1430   PT Stop Time 1524   PT Time Calculation (min) 54 min   Activity Tolerance Patient limited by pain      Past Medical History  Diagnosis Date  . Varicose veins   . PONV (postoperative nausea and vomiting) 02-23-12    always has PONV  . Post-nasal drainage 02-23-12    frequent a problem,causes a cough and mucus buildup in throat  . ADD (attention deficit disorder) 02-23-12    easily distracted.  . Venous insufficiency     chronic-denies any problems  . Arthritis 02-23-12    Rt. hip and lt. arm  . Abnormal Pap smear, atypical squamous cells of undetermined sign (ASC-US) 1998    treated with cryo, CIN I  . Urethral polyp 1998    Dr. Hartley Barefoot    Past Surgical History  Procedure Laterality Date  . 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 Hand surgery  02-23-12    "blood clot" excised palm left hand 01-18-12  . Excision/release bursa hip  02/29/12  . Breast surgery  02-23-12    Rt. needle biopsy, recent -negative  path showed fibroadenoma  . Colonoscopy  08/2003    repeat 5 yrs.  . Colonoscopy w/ biopsies  12/2008    1 polyp recheck 5 yrs  . Knee arthroscopy w/ meniscal repair  10/2012  . Blood clot removed Left 01/2012    blood clot removed from left hand   . Knee arthroscopy with excision baker's cyst Left 10/2012    meniscal repair    There were no vitals filed for this visit.  Visit Diagnosis:  Pain in joint, shoulder region, right  Stiffness of shoulder joint,  right  Swelling of shoulder joint, right  Stiffness of neck  Pain, neck  Weakness of shoulder      Subjective Assessment - 03/18/15 1436    Subjective Pt reports neck doesn't feel as tight and painful.    Currently in Pain? Yes   Pain Score 6    Pain Location Shoulder   Pain Orientation Right;Anterior;Lateral   Pain Descriptors / Indicators Aching;Throbbing   Aggravating Factors  movement   Pain Relieving Factors ice/rest            [facility] PT Assessment - 03/18/15 0001    Assessment   Medical Diagnosis Rt shoulder scope, SAD,   Onset Date/Surgical Date 02/24/15   Next MD Visit 04/01/15   AROM   AROM Assessment Site Cervical;Shoulder   Right/Left Shoulder Right   Right Shoulder Flexion 120 Degrees  standing, 161 deg supine   Right Shoulder ABduction 72 Degrees  standing, 110 deg supine   Right Shoulder External Rotation 65 Degrees  supine, abd to 90   Cervical - Right Rotation 70   Cervical - Left Rotation 65   PROM   Right Shoulder Flexion --   Right Shoulder ABduction --   Right Shoulder External Rotation --  [facility] Adult PT Treatment/Exercise - 03/18/15 0001    Shoulder Exercises: Supine   External Rotation AAROM;Right;10 reps   External Rotation Limitations with cane   Flexion AROM;AAROM;Right;10 reps  10 reps of each.    Shoulder presses 3 sec hold x 10    Head presses 3 sec hold x 10 - required tactile cues   Flexion Limitations (with cane for AAROM)    Shoulder Exercises: Prone   Extension Strengthening;Right;10 reps;Weights x 2 sets  VC for scap retraction   Extension Weight (lbs) 1   Horizontal ABduction 1 10 reps;Right   Horizontal ABduction 1 Limitations (45 deg abduction)    Other Prone Exercises Prone row R shoulder x 10 x 2 sets with 1# wt   Shoulder Exercises: Standing   Flexion AROM;Right  8 reps.    Flexion Limitations pt began increased scap elevation due to increased pain; stopped activty. Tactile cues  required for improved form.    Other Standing Exercises wall ladder with RUE in scaption x 5 reps (up to #21)   Other Standing Exercises Pendulum after wall ladder to relax shoulder   Modalities   Modalities Electrical Stimulation;Vasopneumatic   Electrical Stimulation   Electrical Stimulation Location Rt shoulder   Electrical Stimulation Action IFC   Electrical Stimulation Parameters to tolerance x 15 min    Electrical Stimulation Goals Pain   Vasopneumatic   Number Minutes Vasopneumatic  15 minutes   Vasopnuematic Location  Shoulder   Vasopneumatic Pressure Medium   Vasopneumatic Temperature  2* (unable to tolerate 3* today)                  PT Short Term Goals - 03/11/15 1632    PT SHORT TERM GOAL #1   Title I with initial HEP   Status Achieved   PT SHORT TERM GOAL #2   Title report neck pain decrease =/> 75% to ease head turning with driving   Status On-going   PT SHORT TERM GOAL #3   Title demo Rt shoulder ROM WFL   Status On-going   PT SHORT TERM GOAL #4   Title improve FOTO =/< 40%   Status On-going           PT Long Term Goals - 03/18/15 1714    PT LONG TERM GOAL #1   Title I with advanced HEP   Time 8   Period Weeks   Status On-going   PT LONG TERM GOAL #2   Title Report pain decrease to allow her perform overhead activities   Time 8   Period Weeks   Status On-going   PT LONG TERM GOAL #3   Title demo Rt shoulder strength =/> 5-/5   Time 8   Period Weeks   Status On-going   PT LONG TERM GOAL #4   Title demo cervical rotation =/> 60 degrees in each direction   Time 8   Period Weeks   Status Achieved   PT LONG TERM GOAL #5   Title improve FOTO =/< 38% limited   Time 8   Period Weeks   Status On-going               Plan - 03/18/15 1513    Clinical Impression Statement Pt tolerated increased resistance with prone exercises.  Continues to report increased pain and demo guarding with active ABD/scaption. Pt reports decrease in Rt  shoulder pain by 2 points with use of estim and vaso.  Pt has met LTG #4  with improved cervical ROM. Pt will benefit from continued PT intervention to maximize functional mobility independence.    Pt will benefit from skilled therapeutic intervention in order to improve on the following deficits Decreased range of motion;Impaired UE functional use;Pain;Postural dysfunction;Decreased strength   Rehab Potential Good   PT Frequency 2x / week   PT Duration 8 weeks   PT Treatment/Interventions ADLs/Self Care Home Management;Cryotherapy;Electrical Stimulation;Iontophoresis 53m/ml Dexamethasone;Ultrasound;Moist Heat;Therapeutic exercise;Manual techniques;Patient/family education   PT Next Visit Plan Continue progressive ROM and strengthening per protocol. Modalites and STW as needed.    Consulted and Agree with Plan of Care Patient        Problem List Patient Active Problem List   Diagnosis Date Noted  . Cervical strain 11/21/2014  . Right shoulder pain 05/15/2014  . DDD (degenerative disc disease), lumbar 02/17/2014  . Baker's cyst 03/18/2013  . Unspecified vitamin D deficiency 03/18/2013  . Trochanteric bursitis of right hip 02/29/2012  . INSOMNIA 04/13/2010  . HYPERLIPIDEMIA 09/17/2007  . CERVICAL POLYP 09/17/2007  . ADD 09/11/2007  . UNSPECIFIED VENOUS INSUFFICIENCY 08/29/2007    JKerin Perna PTA 03/18/2015 5:20 PM  [place] [address]Phone: [phone]  Fax:  [phone]

## 2015-03-24 ENCOUNTER — Encounter: Payer: Self-pay | Admitting: Rehabilitative and Restorative Service Providers"

## 2015-03-24 ENCOUNTER — Ambulatory Visit (INDEPENDENT_AMBULATORY_CARE_PROVIDER_SITE_OTHER): Payer: BC Managed Care – PPO | Admitting: Rehabilitative and Restorative Service Providers"

## 2015-03-24 DIAGNOSIS — R29898 Other symptoms and signs involving the musculoskeletal system: Secondary | ICD-10-CM

## 2015-03-24 DIAGNOSIS — M436 Torticollis: Secondary | ICD-10-CM

## 2015-03-24 DIAGNOSIS — M25511 Pain in right shoulder: Secondary | ICD-10-CM

## 2015-03-24 DIAGNOSIS — M542 Cervicalgia: Secondary | ICD-10-CM

## 2015-03-24 DIAGNOSIS — M25611 Stiffness of right shoulder, not elsewhere classified: Secondary | ICD-10-CM

## 2015-03-24 DIAGNOSIS — M25411 Effusion, right shoulder: Secondary | ICD-10-CM | POA: Diagnosis not present

## 2015-03-24 NOTE — Therapy (Signed)
[place], [place], [address] Phone: [phone]   Fax:  [phone]  Physical Therapy Treatment  Patient Details  Name: Wanda Gonzales MRN: [id number] Date of Birth: [birth year] Referring Provider:  Justice Britain, MD  Encounter Date: 03/24/2015      PT End of Session - 03/24/15 1409    Visit Number 8   Number of Visits 16   Date for PT Re-Evaluation 04/28/15   PT Start Time 1104   PT Stop Time 1155   PT Time Calculation (min) 51 min   Activity Tolerance Patient tolerated treatment well      Past Medical History  Diagnosis Date  . Varicose veins   . PONV (postoperative nausea and vomiting) 02-23-12    always has PONV  . Post-nasal drainage 02-23-12    frequent a problem,causes a cough and mucus buildup in throat  . ADD (attention deficit disorder) 02-23-12    easily distracted.  . Venous insufficiency     chronic-denies any problems  . Arthritis 02-23-12    Rt. hip and lt. arm  . Abnormal Pap smear, atypical squamous cells of undetermined sign (ASC-US) 1998    treated with cryo, CIN I  . Urethral polyp 1998    Dr. Hartley Barefoot    Past Surgical History  Procedure Laterality Date  . 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 Hand surgery  02-23-12    "blood clot" excised palm left hand 01-18-12  . Excision/release bursa hip  02/29/12  . Breast surgery  02-23-12    Rt. needle biopsy, recent -negative  path showed fibroadenoma  . Colonoscopy  08/2003    repeat 5 yrs.  . Colonoscopy w/ biopsies  12/2008    1 polyp recheck 5 yrs  . Knee arthroscopy w/ meniscal repair  10/2012  . Blood clot removed Left 01/2012    blood clot removed from left hand   . Knee arthroscopy with excision baker's cyst Left 10/2012    meniscal repair    There were no vitals filed for this visit.  Visit Diagnosis:  Pain in joint, shoulder region, right  Stiffness of shoulder  joint, right  Swelling of shoulder joint, right  Stiffness of neck  Pain, neck  Weakness of shoulder      Subjective Assessment - 03/24/15 1106    Subjective Patient reports that she has been taking tylenol and using ice yesterday and had a great day. Pain is increased today    Patient Stated Goals Increase ADL's and get rid of pain   Currently in Pain? Yes   Pain Score 6    Pain Location Shoulder   Pain Orientation Right;Anterior;Lateral   Pain Descriptors / Indicators Aching;Throbbing   Pain Type Acute pain   Pain Frequency Constant   Aggravating Factors  Movement, exercises, functioinal use   Pain Relieving Factors ice/meds/rest            [facility] PT Assessment - 03/24/15 0001    PROM   Right Shoulder Flexion 165 Degrees   Right Shoulder ABduction 125 Degrees   Right Shoulder Internal Rotation 70 Degrees   Right Shoulder External Rotation 85 Degrees          [facility] Adult PT Treatment/Exercise - 03/24/15 0001    Shoulder Exercises: Supine   External Rotation AAROM;Right;10 reps   Internal Rotation Limitations AAROM 5 reps   Flexion AAROM;10 reps  Other Supine Exercises scap squeeze 10 sec hold x 10 reps   Vasopneumatic   Number Minutes Vasopneumatic  15 minutes   Vasopnuematic Location  Shoulder   Vasopneumatic Pressure Medium   Vasopneumatic Temperature  3*   Manual Therapy   Manual Therapy Soft tissue mobilization;Myofascial release;Joint mobilization;Scapular mobilization   Manual therapy comments working through Rt shoulder girdle with patient in supine   Joint Mobilization GH gentle mobs   Myofascial Release focus through pecs/deltoid/traps/leveator   Passive ROM Rt shoulder flexion/abd/ER/IR/ext/horiz abd           PT Education - 03/24/15 1408    Education provided Yes   Education Details Importance on posture and alignment with shoulder rehab; improtance on positioin of the scapula in rehab process; scap squeeze    Person(s) Educated Patient    Methods Explanation;Demonstration;Tactile cues;Verbal cues   Comprehension Verbalized understanding          PT Short Term Goals - 03/11/15 1632    PT SHORT TERM GOAL #1   Title I with initial HEP   Status Achieved   PT SHORT TERM GOAL #2   Title report neck pain decrease =/> 75% to ease head turning with driving   Status On-going   PT SHORT TERM GOAL #3   Title demo Rt shoulder ROM WFL   Status On-going   PT SHORT TERM GOAL #4   Title improve FOTO =/< 40%   Status On-going           PT Long Term Goals - 03/18/15 1714    PT LONG TERM GOAL #1   Title I with advanced HEP   Time 8   Period Weeks   Status On-going   PT LONG TERM GOAL #2   Title Report pain decrease to allow her perform overhead activities   Time 8   Period Weeks   Status On-going   PT LONG TERM GOAL #3   Title demo Rt shoulder strength =/> 5-/5   Time 8   Period Weeks   Status On-going   PT LONG TERM GOAL #4   Title demo cervical rotation =/> 60 degrees in each direction   Time 8   Period Weeks   Status Achieved   PT LONG TERM GOAL #5   Title improve FOTO =/< 38% limited   Time 8   Period Weeks   Status On-going           Plan - 03/24/15 1410    Clinical Impression Statement Working with focus on soft tissue relaxation and mobilization; PROM and stretching; scapular retraction; importance of posture and alignment with good pt response   Pt will benefit from skilled therapeutic intervention in order to improve on the following deficits Decreased range of motion;Impaired UE functional use;Pain;Postural dysfunction;Decreased strength   Rehab Potential Good   PT Frequency 2x / week   PT Treatment/Interventions ADLs/Self Care Home Management;Cryotherapy;Electrical Stimulation;Iontophoresis 4mg /ml Dexamethasone;Ultrasound;Moist Heat;Therapeutic exercise;Manual techniques;Patient/family education   PT Next Visit Plan Continue progressive ROM and strengthening per protocol. Modalites and STW as  needed.         Problem List Patient Active Problem List   Diagnosis Date Noted  . Cervical strain 11/21/2014  . Right shoulder pain 05/15/2014  . DDD (degenerative disc disease), lumbar 02/17/2014  . Baker's cyst 03/18/2013  . Unspecified vitamin D deficiency 03/18/2013  . Trochanteric bursitis of right hip 02/29/2012  . INSOMNIA 04/13/2010  . HYPERLIPIDEMIA 09/17/2007  . CERVICAL POLYP 09/17/2007  . ADD 09/11/2007  .  UNSPECIFIED VENOUS INSUFFICIENCY 08/29/2007    Celyn Nilda Simmer, PT, MPH 03/24/2015, 2:13 PM  [facility] [place], [place], [address] Phone: [phone]   Fax:  [phone]

## 2015-03-24 NOTE — Patient Instructions (Signed)
To work on scap squeeze supine and standing To work on posture and alignment

## 2015-03-26 ENCOUNTER — Encounter: Payer: Self-pay | Admitting: Rehabilitative and Restorative Service Providers"

## 2015-03-26 ENCOUNTER — Ambulatory Visit (INDEPENDENT_AMBULATORY_CARE_PROVIDER_SITE_OTHER): Payer: BC Managed Care – PPO | Admitting: Rehabilitative and Restorative Service Providers"

## 2015-03-26 DIAGNOSIS — M25511 Pain in right shoulder: Secondary | ICD-10-CM | POA: Diagnosis not present

## 2015-03-26 DIAGNOSIS — M25611 Stiffness of right shoulder, not elsewhere classified: Secondary | ICD-10-CM | POA: Diagnosis not present

## 2015-03-26 DIAGNOSIS — R29898 Other symptoms and signs involving the musculoskeletal system: Secondary | ICD-10-CM

## 2015-03-26 DIAGNOSIS — M436 Torticollis: Secondary | ICD-10-CM

## 2015-03-26 DIAGNOSIS — M25411 Effusion, right shoulder: Secondary | ICD-10-CM

## 2015-03-26 DIAGNOSIS — M542 Cervicalgia: Secondary | ICD-10-CM

## 2015-03-26 NOTE — Patient Instructions (Signed)
CHEST: Doorway, Unilateral - Standing   Standing in doorway, right hand on wall with elbow bent at shoulder height.weight stays on your legs. Slowly turn your feet and body to the left until you feel a stretch.  Hold 20-30___ seconds. 3-5___ reps per set - 3 times/day   Internal Rotation   Stand with hands clasped behind back. Pull elbows back, slide hands up back. Hold _20-30___ seconds. Repeat _3-5___ times. Do __3__ sessions per day.

## 2015-03-26 NOTE — Therapy (Signed)
[facility] [place] [address] [place] [address] [place], [place], [address] Phone: [phone]   Fax:  [phone]  Physical Therapy Treatment  Patient Details  Name: Wanda Gonzales MRN: [id number] Date of Birth: 08-19-1956 Referring Provider:  Francena Hanly, MD  Encounter Date: 03/26/2015      PT End of Session - 03/26/15 1149    Visit Number 9   Number of Visits 16   Date for PT Re-Evaluation 04/28/15   PT Start Time 1149   Activity Tolerance Patient tolerated treatment well   Behavior During Therapy [facility] for tasks assessed/performed      Past Medical History  Diagnosis Date  . Varicose veins   . PONV (postoperative nausea and vomiting) 02-23-12    always has PONV  . Post-nasal drainage 02-23-12    frequent a problem,causes a cough and mucus buildup in throat  . ADD (attention deficit disorder) 02-23-12    easily distracted.  . Venous insufficiency     chronic-denies any problems  . Arthritis 02-23-12    Rt. hip and lt. arm  . Abnormal Pap smear, atypical squamous cells of undetermined sign (ASC-US) 1998    treated with cryo, CIN I  . Urethral polyp 1998    Dr. Marcello Fennel    Past Surgical History  Procedure Laterality Date  . 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 Hand surgery  02-23-12    "blood clot" excised palm left hand 01-18-12  . Excision/release bursa hip  02/29/12  . Breast surgery  02-23-12    Rt. needle biopsy, recent -negative  path showed fibroadenoma  . Colonoscopy  08/2003    repeat 5 yrs.  . Colonoscopy w/ biopsies  12/2008    1 polyp recheck 5 yrs  . Knee arthroscopy w/ meniscal repair  10/2012  . Blood clot removed Left 01/2012    blood clot removed from left hand   . Knee arthroscopy with excision baker's cyst Left 10/2012    meniscal repair    There were no vitals filed for this visit.  Visit Diagnosis:  Pain in joint, shoulder region, right  Stiffness of  shoulder joint, right  Swelling of shoulder joint, right  Stiffness of neck  Pain, neck  Weakness of shoulder      Subjective Assessment - 03/26/15 1320    Subjective Feeling better with less pain since last treatment, She did fall last night and hit her shoulder but it did not seem to hurt anything. She was at the hospital waiting with her mother for a proceedure for her step father this am and had some sching in the shoulder and into her arm - resolver with OTC meds. Can tell she is improving. Sees MD Wednesday 04/01/15   Currently in Pain? Yes   Pain Score 3    Pain Location Shoulder   Pain Orientation Right;Anterior;Lateral   Pain Descriptors / Indicators Aching   Pain Type Acute pain   Pain Radiating Towards less discomfort in neck and arm   Pain Onset More than a month ago   Pain Frequency Intermittent   Aggravating Factors  Movement, exercses, functional use   Pain Relieving Factors ice/meds/rest           [facility] PT Assessment - 03/26/15 0001    PROM   Right Shoulder Flexion 165 Degrees  with pain/Lt shoulder flexioin supine 165 painfree   Right Shoulder ABduction 135 Degrees   Right  Shoulder Internal Rotation 75 Degrees   Right Shoulder External Rotation 90 Degrees          [facility] Adult PT Treatment/Exercise - 03/26/15 0001    Shoulder Exercises: Supine   External Rotation AAROM;Right;10 reps   Internal Rotation Limitations AAROM 5 reps   Flexion AAROM;10 reps   Other Supine Exercises scap squeeze 10 sec hold x 10 reps   Shoulder Exercises: Stretch   Internal Rotation Stretch Limitations IR clasping hands bringing hands up back 20 sec hold 3 reps   Other Shoulder Stretches unilateral doorway stretch Rt ER at 90 degrees abd 30 sec x 3 reps   Cryotherapy   Number Minutes Cryotherapy 12 Minutes   Cryotherapy Location Shoulder   Type of Cryotherapy Ice pack   Manual Therapy   Manual Therapy Soft tissue mobilization;Myofascial release;Joint  mobilization;Scapular mobilization   Manual therapy comments working through Rt shoulder girdle with patient in supine   Joint Mobilization GH mobs   Myofascial Release focus through pecs/deltoid/traps/leveator   Passive ROM Rt shoulder flexion/abd/ER/IR/ext/horiz abd           PT Education - 03/26/15 1331    Education provided Yes   Education Details Continued to focus on the importance of posture and alignment with shoulder rehab; added ER/IR stretches in standing   Person(s) Educated Patient   Methods Explanation;Demonstration;Tactile cues;Verbal cues;Handout   Comprehension Verbalized understanding;Returned demonstration;Verbal cues required          PT Short Term Goals - 03/11/15 1632    PT SHORT TERM GOAL #1   Title I with initial HEP   Status Achieved   PT SHORT TERM GOAL #2   Title report neck pain decrease =/> 75% to ease head turning with driving   Status On-going   PT SHORT TERM GOAL #3   Title demo Rt shoulder ROM WFL   Status On-going   PT SHORT TERM GOAL #4   Title improve FOTO =/< 40%   Status On-going           PT Long Term Goals - 03/18/15 1714    PT LONG TERM GOAL #1   Title I with advanced HEP   Time 8   Period Weeks   Status On-going   PT LONG TERM GOAL #2   Title Report pain decrease to allow her perform overhead activities   Time 8   Period Weeks   Status On-going   PT LONG TERM GOAL #3   Title demo Rt shoulder strength =/> 5-/5   Time 8   Period Weeks   Status On-going   PT LONG TERM GOAL #4   Title demo cervical rotation =/> 60 degrees in each direction   Time 8   Period Weeks   Status Achieved   PT LONG TERM GOAL #5   Title improve FOTO =/< 38% limited   Time 8   Period Weeks   Status On-going            Plan - 03/26/15 1333    Clinical Impression Statement Focus on soft tissue mobilization to decrease muscular tightness and muscle guarding thus decreasing pain and improving ROM and exercise tolerance; PROM and  stretching with good gains in PROM noted. Continued to stress the importance of posture and alignment   Pt will benefit from skilled therapeutic intervention in order to improve on the following deficits Decreased range of motion;Impaired UE functional use;Pain;Postural dysfunction;Decreased strength   Rehab Potential Good   PT Frequency 2x /  week   PT Duration 8 weeks   PT Next Visit Plan Continue progressive ROM and strengthening per protocol. Modalites and manual therapy techniques as indicated.    PT Home Exercise Plan Added ER/IR in standing; encouraged work on scap squeeze and relaxation   Consulted and Agree with Plan of Care Patient      Problem List Patient Active Problem List   Diagnosis Date Noted  . Cervical strain 11/21/2014  . Right shoulder pain 05/15/2014  . DDD (degenerative disc disease), lumbar 02/17/2014  . Baker's cyst 03/18/2013  . Unspecified vitamin D deficiency 03/18/2013  . Trochanteric bursitis of right hip 02/29/2012  . INSOMNIA 04/13/2010  . HYPERLIPIDEMIA 09/17/2007  . CERVICAL POLYP 09/17/2007  . ADD 09/11/2007  . UNSPECIFIED VENOUS INSUFFICIENCY 08/29/2007    Kiah Vanalstine P Renn Dirocco,PT, MPH 03/26/2015, 1:40 PM  [facility] [address] [place], [place], [address] Phone: [phone]   Fax:  [phone]

## 2015-03-30 ENCOUNTER — Ambulatory Visit (INDEPENDENT_AMBULATORY_CARE_PROVIDER_SITE_OTHER): Payer: BC Managed Care – PPO | Admitting: Physical Therapy

## 2015-03-30 ENCOUNTER — Encounter: Payer: Self-pay | Admitting: Physical Therapy

## 2015-03-30 DIAGNOSIS — M436 Torticollis: Secondary | ICD-10-CM | POA: Diagnosis not present

## 2015-03-30 DIAGNOSIS — R29898 Other symptoms and signs involving the musculoskeletal system: Secondary | ICD-10-CM

## 2015-03-30 DIAGNOSIS — M25511 Pain in right shoulder: Secondary | ICD-10-CM | POA: Diagnosis not present

## 2015-03-30 DIAGNOSIS — M25611 Stiffness of right shoulder, not elsewhere classified: Secondary | ICD-10-CM | POA: Diagnosis not present

## 2015-03-30 DIAGNOSIS — M25411 Effusion, right shoulder: Secondary | ICD-10-CM

## 2015-03-30 NOTE — Therapy (Signed)
[place], [place], [address] Phone: [phone]   Fax:  [phone]  Physical Therapy Treatment  Patient Details  Name: Wanda Gonzales MRN: [id number] Date of Birth: 1955-11-20 Referring Provider:  Justice Britain, MD  Encounter Date: 03/30/2015      PT End of Session - 03/30/15 1447    Visit Number 10   Number of Visits 16   Date for PT Re-Evaluation 04/28/15   PT Start Time 2876   PT Stop Time 1510   PT Time Calculation (min) 55 min   Activity Tolerance Patient tolerated treatment well      Past Medical History  Diagnosis Date  . Varicose veins   . PONV (postoperative nausea and vomiting) 02-23-12    always has PONV  . Post-nasal drainage 02-23-12    frequent a problem,causes a cough and mucus buildup in throat  . ADD (attention deficit disorder) 02-23-12    easily distracted.  . Venous insufficiency     chronic-denies any problems  . Arthritis 02-23-12    Rt. hip and lt. arm  . Abnormal Pap smear, atypical squamous cells of undetermined sign (ASC-US) 1998    treated with cryo, CIN I  . Urethral polyp 1998    Dr. Hartley Barefoot    Past Surgical History  Procedure Laterality Date  . 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 Hand surgery  02-23-12    "blood clot" excised palm left hand 01-18-12  . Excision/release bursa hip  02/29/12  . Breast surgery  02-23-12    Rt. needle biopsy, recent -negative  path showed fibroadenoma  . Colonoscopy  08/2003    repeat 5 yrs.  . Colonoscopy w/ biopsies  12/2008    1 polyp recheck 5 yrs  . Knee arthroscopy w/ meniscal repair  10/2012  . Blood clot removed Left 01/2012    blood clot removed from left hand   . Knee arthroscopy with excision baker's cyst Left 10/2012    meniscal repair    There were no vitals filed for this visit.  Visit Diagnosis:  Pain in joint, shoulder region, right  Stiffness of shoulder  joint, right  Swelling of shoulder joint, right  Stiffness of neck  Weakness of shoulder      Subjective Assessment - 03/30/15 1416    Subjective continues to use ice during the day, hasn't had to take any tylenol today yet.    Currently in Pain? Yes   Pain Score 4    Pain Location Shoulder   Pain Orientation Right;Posterior;Upper   Pain Descriptors / Indicators Aching   Pain Type Surgical pain   Pain Radiating Towards pain is decreasing from last week   Pain Onset More than a month ago   Pain Frequency Intermittent   Aggravating Factors  reaching up high and out front, trying to lift heavy things ( she has stopped this), stress, lying on it   Pain Relieving Factors ice             [facility] PT Assessment - 03/30/15 0001    Assessment   Medical Diagnosis Rt shoulder scope, SAD,   Onset Date/Surgical Date 02/24/15   Next MD Visit 04/01/15   Observation/Other Assessments   Focus on Therapeutic Outcomes (FOTO)  46% limited ( was 61 at eval)   AROM   AROM Assessment Site Cervical;Shoulder   Right/Left Shoulder Right   Right Shoulder  Flexion 155 Degrees   Right Shoulder ABduction 114 Degrees   Right Shoulder Internal Rotation 95 Degrees   Right Shoulder External Rotation 82 Degrees   Cervical Flexion --  WNL   Cervical Extension --  WNL   Cervical - Right Rotation 66   Cervical - Left Rotation 75                     [facility] Adult PT Treatment/Exercise - 03/30/15 0001    Shoulder Exercises: Supine   Flexion Strengthening;Right;20 reps;Weights  FWD punch   Shoulder Flexion Weight (lbs) 2#   Other Supine Exercises shoulder presses x10, 5 sec holds   Other Supine Exercises 2x10 horizontal abduction with yellow band.    Shoulder Exercises: Seated   Extension Strengthening;Right;20 reps;Theraband   Theraband Level (Shoulder Extension) Level 1 (Yellow)   External Rotation Strengthening;Both;20 reps;Theraband   Theraband Level (Shoulder External Rotation) Level  1 (Yellow)   Internal Rotation Strengthening;Right;20 reps;Theraband   Theraband Level (Shoulder Internal Rotation) Level 1 (Yellow)   Shoulder Exercises: Prone   Retraction Strengthening;Right  30 reps off edge of bed, VC for scap retraction   Extension Strengthening;Right  30 reps with arm off EOB, VC for scap retraction   Shoulder Exercises: Pulleys   Flexion 3 minutes   Manual Therapy   Joint Mobilization Rt scapular mobs   Soft tissue mobilization TPR to medial scapular border   Passive ROM Rt scapula in s/l                  PT Short Term Goals - 03/30/15 1434    PT SHORT TERM GOAL #1   Title I with initial HEP   Status Achieved   PT SHORT TERM GOAL #2   Title report neck pain decrease =/> 75% to ease head turning with driving  Pt reports 23% improvement   Status On-going   PT SHORT TERM GOAL #3   Title demo Rt shoulder ROM WFL  see note, IR/ER WNL, Flexion WFL, abduction limited the most   Status Partially Met   PT SHORT TERM GOAL #4   Title improve FOTO =/< 40%  scored 46% limited   Status On-going           PT Long Term Goals - 03/30/15 1436    PT LONG TERM GOAL #1   Title I with advanced HEP   Status On-going   PT LONG TERM GOAL #2   Title Report pain decrease to allow her perform overhead activities   Status On-going   PT LONG TERM GOAL #3   Title demo Rt shoulder strength =/> 5-/5   Status On-going   PT LONG TERM GOAL #4   Title demo cervical rotation =/> 60 degrees in each direction   Status Achieved   PT LONG TERM GOAL #5   Title improve FOTO =/< 38% limited   Status On-going               Problem List Patient Active Problem List   Diagnosis Date Noted  . Cervical strain 11/21/2014  . Right shoulder pain 05/15/2014  . DDD (degenerative disc disease), lumbar 02/17/2014  . Baker's cyst 03/18/2013  . Unspecified vitamin D deficiency 03/18/2013  . Trochanteric bursitis of right hip 02/29/2012  . INSOMNIA 04/13/2010  .  HYPERLIPIDEMIA 09/17/2007  . CERVICAL POLYP 09/17/2007  . ADD 09/11/2007  . UNSPECIFIED VENOUS INSUFFICIENCY 08/29/2007    Jeral Pinch, PT 03/30/2015, 3:02 PM  [facility] [place], [place], [address] Phone: [phone]   Fax:  [phone]

## 2015-04-02 ENCOUNTER — Ambulatory Visit (INDEPENDENT_AMBULATORY_CARE_PROVIDER_SITE_OTHER): Payer: BC Managed Care – PPO | Admitting: Rehabilitative and Restorative Service Providers"

## 2015-04-02 ENCOUNTER — Encounter: Payer: Self-pay | Admitting: Rehabilitative and Restorative Service Providers"

## 2015-04-02 DIAGNOSIS — M542 Cervicalgia: Secondary | ICD-10-CM

## 2015-04-02 DIAGNOSIS — M25511 Pain in right shoulder: Secondary | ICD-10-CM

## 2015-04-02 DIAGNOSIS — M25611 Stiffness of right shoulder, not elsewhere classified: Secondary | ICD-10-CM | POA: Diagnosis not present

## 2015-04-02 DIAGNOSIS — M25411 Effusion, right shoulder: Secondary | ICD-10-CM | POA: Diagnosis not present

## 2015-04-02 DIAGNOSIS — R29898 Other symptoms and signs involving the musculoskeletal system: Secondary | ICD-10-CM

## 2015-04-02 DIAGNOSIS — M436 Torticollis: Secondary | ICD-10-CM | POA: Diagnosis not present

## 2015-04-02 NOTE — Therapy (Signed)
[facility] [place] [address] [place], [place], [address] Phone: [phone]   Fax:  [phone]  Physical Therapy Treatment  Patient Details  Name: Wanda Gonzales MRN: [id number] Date of Birth: July 11, 1956 Referring Provider:  Francena Hanly, MD  Encounter Date: 04/02/2015      PT End of Session - 04/02/15 1518    Visit Number 10   Number of Visits 16   Date for PT Re-Evaluation 04/28/15   PT Start Time 1412   PT Stop Time 1518   PT Time Calculation (min) 66 min   Activity Tolerance Patient tolerated treatment well      Past Medical History  Diagnosis Date  . Varicose veins   . PONV (postoperative nausea and vomiting) 02-23-12    always has PONV  . Post-nasal drainage 02-23-12    frequent a problem,causes a cough and mucus buildup in throat  . ADD (attention deficit disorder) 02-23-12    easily distracted.  . Venous insufficiency     chronic-denies any problems  . Arthritis 02-23-12    Rt. hip and lt. arm  . Abnormal Pap smear, atypical squamous cells of undetermined sign (ASC-US) 1998    treated with cryo, CIN I  . Urethral polyp 1998    Dr. Marcello Fennel    Past Surgical History  Procedure Laterality Date  . 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 Hand surgery  02-23-12    "blood clot" excised palm left hand 01-18-12  . Excision/release bursa hip  02/29/12  . Breast surgery  02-23-12    Rt. needle biopsy, recent -negative  path showed fibroadenoma  . Colonoscopy  08/2003    repeat 5 yrs.  . Colonoscopy w/ biopsies  12/2008    1 polyp recheck 5 yrs  . Knee arthroscopy w/ meniscal repair  10/2012  . Blood clot removed Left 01/2012    blood clot removed from left hand   . Knee arthroscopy with excision baker's cyst Left 10/2012    meniscal repair    There were no vitals filed for this visit.  Visit Diagnosis:  Pain in joint, shoulder region, right  Stiffness of shoulder  joint, right  Swelling of shoulder joint, right  Stiffness of neck  Weakness of shoulder  Pain, neck      Subjective Assessment - 04/02/15 1407    Subjective Seen by MD yesterday and he was pleased with her progress with shoulder rehab. OK for her to continue PT 4 weeks. She went to water aerobics Tuesday and was sore following that. She did not use her Rt  UE much during the class   Currently in Pain? Yes   Pain Score 4    Pain Location Shoulder   Pain Orientation Right   Pain Descriptors / Indicators Aching   Pain Type Chronic pain   Pain Onset More than a month ago   Pain Frequency Intermittent   Aggravating Factors  reaching up high or out to front; lifting anything heavy; lying on Rt side; stress   Pain Relieving Factors ice            [facility] PT Assessment - 04/02/15 0001    Assessment   Next MD Visit 04/27/15           [facility] Adult PT Treatment/Exercise - 04/02/15 0001    Neuro Re-ed    Neuro Re-ed Details  working on posture and alignment working to  recruit posterior shoulder girdle musculature    Shoulder Exercises: Supine   External Rotation AAROM;Right;10 reps   Internal Rotation Limitations AAROM 5 reps   Flexion Strengthening;Right;20 reps;Weights  FWD punch   Shoulder Flexion Weight (lbs) 2#   Flexion Limitations AAROM shoulder flexion 10 reps   Other Supine Exercises shoulder presses x10, 5 sec holds   Other Supine Exercises 2x10 diagonal with yellow band.    Shoulder Exercises: Prone   Retraction Strengthening;Right  30 reps off edge of bed, VC for scap retraction   Extension Strengthening;Right  30 reps with arm off EOB, VC for scap retraction   Other Prone Exercises scaption through partial range    Shoulder Exercises: Sidelying   External Rotation Strengthening;Right;20 reps;Weights   Theraband Level (Shoulder External Rotation) --  2#   Shoulder Exercises: Standing   Extension Strengthening;Both;20 reps;Theraband   Theraband Level  (Shoulder Extension) Level 1 (Yellow)  foam roll between shoulder baldes in standing   Row Strengthening;Both;20 reps;Theraband   Theraband Level (Shoulder Row) Level 1 (Yellow)   Row Weight (lbs) foam roll along spine between shoulder blades   Retraction Strengthening;Both;10 reps  10 sec hold   Theraband Level (Shoulder Retraction) Level 1 (Yellow)   Other Standing Exercises scaption with foam roll 2 # bilat    Other Standing Exercises ball on wall in shoulder flexion closed chain 1 min x2   Shoulder Exercises: ROM/Strengthening   UBE (Upper Arm Bike) L1 2 min forward. 2.5 min back standing    Wall Pushups 10 reps   Cryotherapy   Number Minutes Cryotherapy 15 Minutes   Cryotherapy Location Shoulder   Type of Cryotherapy Ice pack   Electrical Stimulation   Electrical Stimulation Location Rt shoulder   Electrical Stimulation Action IFC   Electrical Stimulation Parameters to tolerance   Electrical Stimulation Goals Pain   Manual Therapy   Manual Therapy Soft tissue mobilization;Myofascial release;Joint mobilization;Scapular mobilization   Manual therapy comments working through Rt shoulder girdle with patient in supine   Myofascial Release focus through pecs/deltoid/traps/leveator   Passive ROM Rt shoulder             PT Education - 04/02/15 1516    Education provided Yes   Education Details Encouraged improved posture and alignment; added pec stretches bilat in doorway; TB posterior shoulder girdle strengthening; shoulder strengthening exercises   Person(s) Educated Patient   Methods Explanation;Demonstration;Tactile cues;Verbal cues;Handout   Comprehension Verbalized understanding;Returned demonstration;Verbal cues required;Tactile cues required          PT Short Term Goals - 03/30/15 1434    PT SHORT TERM GOAL #1   Title I with initial HEP   Status Achieved   PT SHORT TERM GOAL #2   Title report neck pain decrease =/> 75% to ease head turning with driving  Pt  reports 13% improvement   Status On-going   PT SHORT TERM GOAL #3   Title demo Rt shoulder ROM WFL  see note, IR/ER WNL, Flexion WFL, abduction limited the most   Status Partially Met   PT SHORT TERM GOAL #4   Title improve FOTO =/< 40%  scored 46% limited   Status On-going           PT Long Term Goals - 03/30/15 1436    PT LONG TERM GOAL #1   Title I with advanced HEP   Status On-going   PT LONG TERM GOAL #2   Title Report pain decrease to allow her perform overhead activities  Status On-going   PT LONG TERM GOAL #3   Title demo Rt shoulder strength =/> 5-/5   Status On-going   PT LONG TERM GOAL #4   Title demo cervical rotation =/> 60 degrees in each direction   Status Achieved   PT LONG TERM GOAL #5   Title improve FOTO =/< 38% limited   Status On-going           Plan - 04/02/15 1519    Clinical Impression Statement Work on soft tissue tightness; joint restriction; strength   Pt will benefit from skilled therapeutic intervention in order to improve on the following deficits Decreased range of motion;Impaired UE functional use;Pain;Postural dysfunction;Decreased strength   Rehab Potential Good   PT Frequency 2x / week   PT Duration 8 weeks   PT Treatment/Interventions ADLs/Self Care Home Management;Cryotherapy;Electrical Stimulation;Iontophoresis 4mg /ml Dexamethasone;Ultrasound;Moist Heat;Therapeutic exercise;Manual techniques;Patient/family education   PT Next Visit Plan Continue progressive ROM and strengthening per protocol. Modalites and manual therapy techniques as indicated.    PT Home Exercise Plan Added pec stretch; TB exercises   Consulted and Agree with Plan of Care Patient        Problem List Patient Active Problem List   Diagnosis Date Noted  . Cervical strain 11/21/2014  . Right shoulder pain 05/15/2014  . DDD (degenerative disc disease), lumbar 02/17/2014  . Baker's cyst 03/18/2013  . Unspecified vitamin D deficiency 03/18/2013  .  Trochanteric bursitis of right hip 02/29/2012  . INSOMNIA 04/13/2010  . HYPERLIPIDEMIA 09/17/2007  . CERVICAL POLYP 09/17/2007  . ADD 09/11/2007  . UNSPECIFIED VENOUS INSUFFICIENCY 08/29/2007    Bernette Seeman Rober Minion, PT, MPH 04/02/2015, 3:21 PM  [facility] [address] [place], [place], [address] Phone: [phone]   Fax:  [phone]

## 2015-04-02 NOTE — Patient Instructions (Signed)
Scapular Retraction (Standing)   With arms at sides, pinch shoulder blades down and back. Repeat _10___ times Hold 10 sec. Do _several___ sessions per day.    Scapula Adduction With Pectorals, Low   Stand in doorframe with palms against frame and arms at 45. Lean forward and squeeze shoulder blades. Hold _20-30__ seconds. Repeat _3__ times per session. Do 2___ sessions per day.  Copyright  VHI. All rights reserved.    Scapula Adduction With Pectorals, Mid-Range   Stand in doorframe with palms against frame and arms at 90. Lean forward and squeeze shoulder blades. Hold __20-30 seconds. Repeat _3__ times per session. Do _2__ sessions per day. \Scapula Adduction With Pectorals, High   Stand in doorframe with palms against frame and arms at 120. Lean forward and squeeze shoulder blades. Hold _20-30__ seconds. Repeat _3__ times per session. Do _2__ sessions per day.   Resisted External Rotation: in Neutral - Bilateral   PALMS UP Sit or stand, tubing in both hands, elbows at sides, bent to 90, forearms forward. Pinch shoulder blades together and rotate forearms out. Keep elbows at sides. Repeat __10__ times per set. Do _2-3___ sets per session. Do _2-3___ sessions per day.   Low Row: Standing   Face anchor, feet shoulder width apart. Palms up, pull arms back, squeezing shoulder blades together. Repeat 10__ times per set. Do 2-3__ sets per session. Do 2-3__ sessions per week. Anchor Height: Waist     Strengthening: Resisted Extension   Hold tubing in right hand, arm forward. Pull arm back, elbow straight. Repeat _10___ times per set. Do 2-3____ sets per session. Do 2-3____ sessions per day.

## 2015-04-06 ENCOUNTER — Encounter: Payer: BC Managed Care – PPO | Admitting: Rehabilitative and Restorative Service Providers"

## 2015-04-09 ENCOUNTER — Other Ambulatory Visit: Payer: Self-pay | Admitting: Nurse Practitioner

## 2015-04-09 ENCOUNTER — Encounter: Payer: Self-pay | Admitting: Rehabilitative and Restorative Service Providers"

## 2015-04-09 ENCOUNTER — Ambulatory Visit (INDEPENDENT_AMBULATORY_CARE_PROVIDER_SITE_OTHER): Payer: BC Managed Care – PPO | Admitting: Rehabilitative and Restorative Service Providers"

## 2015-04-09 DIAGNOSIS — M436 Torticollis: Secondary | ICD-10-CM | POA: Diagnosis not present

## 2015-04-09 DIAGNOSIS — M25411 Effusion, right shoulder: Secondary | ICD-10-CM | POA: Diagnosis not present

## 2015-04-09 DIAGNOSIS — Z139 Encounter for screening, unspecified: Secondary | ICD-10-CM

## 2015-04-09 DIAGNOSIS — M25511 Pain in right shoulder: Secondary | ICD-10-CM | POA: Diagnosis not present

## 2015-04-09 DIAGNOSIS — R29898 Other symptoms and signs involving the musculoskeletal system: Secondary | ICD-10-CM

## 2015-04-09 DIAGNOSIS — M25611 Stiffness of right shoulder, not elsewhere classified: Secondary | ICD-10-CM

## 2015-04-09 DIAGNOSIS — M542 Cervicalgia: Secondary | ICD-10-CM
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
## 2015-04-10 ENCOUNTER — Encounter: Payer: BC Managed Care – PPO | Admitting: Physical Therapy

## 2015-04-13 ENCOUNTER — Ambulatory Visit (INDEPENDENT_AMBULATORY_CARE_PROVIDER_SITE_OTHER): Payer: BC Managed Care – PPO | Admitting: Rehabilitative and Restorative Service Providers"

## 2015-04-13 ENCOUNTER — Encounter: Payer: Self-pay | Admitting: Rehabilitative and Restorative Service Providers"

## 2015-04-13 DIAGNOSIS — M25611 Stiffness of right shoulder, not elsewhere classified: Secondary | ICD-10-CM | POA: Diagnosis not present

## 2015-04-13 DIAGNOSIS — M25511 Pain in right shoulder: Secondary | ICD-10-CM | POA: Diagnosis not present

## 2015-04-13 DIAGNOSIS — R29898 Other symptoms and signs involving the musculoskeletal system: Secondary | ICD-10-CM

## 2015-04-13 DIAGNOSIS — M436 Torticollis: Secondary | ICD-10-CM

## 2015-04-13 NOTE — Therapy (Signed)
[place], [place], [address] Phone: [phone]   Fax:  [phone]  Physical Therapy Treatment  Patient Details  Name: Wanda Gonzales MRN: [id number] Date of Birth: 1956-03-23 Referring Provider:  Justice Britain, MD  Encounter Date: 04/13/2015      PT End of Session - 04/13/15 1620    Visit Number 12   Number of Visits 16   Date for PT Re-Evaluation 04/28/15   PT Start Time 0320   PT Stop Time 0418   PT Time Calculation (min) 58 min   Activity Tolerance Patient tolerated treatment well;No increased pain      Past Medical History  Diagnosis Date  . Varicose veins   . PONV (postoperative nausea and vomiting) 02-23-12    always has PONV  . Post-nasal drainage 02-23-12    frequent a problem,causes a cough and mucus buildup in throat  . ADD (attention deficit disorder) 02-23-12    easily distracted.  . Venous insufficiency     chronic-denies any problems  . Arthritis 02-23-12    Rt. hip and lt. arm  . Abnormal Pap smear, atypical squamous cells of undetermined sign (ASC-US) 1998    treated with cryo, CIN I  . Urethral polyp 1998    Dr. Hartley Barefoot    Past Surgical History  Procedure Laterality Date  . 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 Hand surgery  02-23-12    "blood clot" excised palm left hand 01-18-12  . Excision/release bursa hip  02/29/12  . Breast surgery  02-23-12    Rt. needle biopsy, recent -negative  path showed fibroadenoma  . Colonoscopy  08/2003    repeat 5 yrs.  . Colonoscopy w/ biopsies  12/2008    1 polyp recheck 5 yrs  . Knee arthroscopy w/ meniscal repair  10/2012  . Blood clot removed Left 01/2012    blood clot removed from left hand   . Knee arthroscopy with excision baker's cyst Left 10/2012    meniscal repair    There were no vitals filed for this visit.  Visit Diagnosis:  Pain in joint, shoulder region,  right  Stiffness of shoulder joint, right  Stiffness of neck  Weakness of shoulder      Subjective Assessment - 04/13/15 1528    Subjective Shoulder is feeling better - less pain and improving strength. Working on exercises at home. Home is out of power.    Currently in Pain? No/denies            [facility] Adult PT Treatment/Exercise - 04/13/15 0001    Neuro Re-ed    Neuro Re-ed Details  working on posture and alignment working to recruit posterior shoulder girdle musculature    Shoulder Exercises: Standing   ABduction --  diagonals 20 reps each direction red TB noodle at spine   Extension Strengthening;Both;20 reps;Theraband   Theraband Level (Shoulder Extension) Level 2 (Red)   Row Strengthening;Both;20 reps;Theraband   Theraband Level (Shoulder Row) Level 1 (Yellow)   Row Weight (lbs) foam roll along spine between shoulder blades   Retraction Strengthening;Both;10 reps  10 sec hold   Theraband Level (Shoulder Retraction) Level 2 (Red)   Other Standing Exercises scaption with foam roll 2 # bilat    Other Standing Exercises reverse wall push up 10 x2    Shoulder Exercises: Therapy Ball   Flexion Limitations bouncing small ball on wall  Other Therapy Ball Exercises diagonal ball chopping 10 reps each directioin 2 sets   Shoulder Exercises: ROM/Strengthening   UBE (Upper Arm Bike) L3 1 min forward/1 min back x2    Wall Pushups 20 reps   Ball on Wall circles/bouncing   Rhythmic Stabilization, Supine 30 sec 2 reps   Shoulder Exercises: Stretch   Other Shoulder Stretches doorway stretch x3 positionsl 30 sec one rep    Shoulder Exercises: Body Blade   Flexion 60 seconds;2 reps   Cryotherapy   Cryotherapy Location Shoulder;Cervical   Type of Cryotherapy Ice pack   Electrical Stimulation   Electrical Stimulation Location Rt shoulder   Electrical Stimulation Action IFC   Electrical Stimulation Parameters to tolerance   Electrical Stimulation Goals Pain   Manual Therapy    Manual Therapy Soft tissue mobilization;Myofascial release;Joint mobilization;Scapular mobilization   Manual therapy comments working through Rt shoulder girdle with patient in supine   Joint Mobilization Rt scapular mobs   Myofascial Release focus through pecs/deltoid/traps/leveator   Passive ROM Rt shoulder            PT Short Term Goals - 04/09/15 1514    PT SHORT TERM GOAL #1   Title I with initial HEP   Time 4   Status Achieved   PT SHORT TERM GOAL #2   Title report neck pain decrease =/> 75% to ease head turning with driving   Period Weeks   Status On-going   PT SHORT TERM GOAL #3   Title demo Rt shoulder ROM WFL   Baseline PROM WFL - AROM increasing   Time 4   Period Weeks   Status Partially Met   PT SHORT TERM GOAL #4   Title improve FOTO =/< 40%   Time 4   Period Weeks   Status On-going           PT Long Term Goals - 04/09/15 1515    PT LONG TERM GOAL #1   Title I with advanced HEP   Time 8   Period Weeks   Status On-going   PT LONG TERM GOAL #2   Title Report pain decrease to allow her perform overhead activities   Time 8   Period Weeks   Status On-going   PT LONG TERM GOAL #3   Title demo Rt shoulder strength =/> 5-/5   Period Weeks   Status On-going   PT LONG TERM GOAL #4   Title demo cervical rotation =/> 60 degrees in each direction   Period Weeks   Status Achieved            Plan - 04/13/15 1620    Clinical Impression Statement Continue work on soft tissue tightness, joint restrictions, progress strengthening    Pt will benefit from skilled therapeutic intervention in order to improve on the following deficits Decreased range of motion;Impaired UE functional use;Pain;Postural dysfunction;Decreased strength   Rehab Potential Good   PT Frequency 2x / week   PT Duration 8 weeks   PT Treatment/Interventions ADLs/Self Care Home Management;Cryotherapy;Electrical Stimulation;Iontophoresis 92m/ml Dexamethasone;Ultrasound;Moist  Heat;Therapeutic exercise;Manual techniques;Patient/family education   PT Next Visit Plan Continue progressive ROM and strengthening per protocol. Modalites and manual therapy techniques as indicated.    PT Home Exercise Plan continue HEP    Consulted and Agree with Plan of Care Patient        Problem List Patient Active Problem List   Diagnosis Date Noted  . Cervical strain 11/21/2014  . Right shoulder pain 05/15/2014  .  DDD (degenerative disc disease), lumbar 02/17/2014  . Baker's cyst 03/18/2013  . Unspecified vitamin D deficiency 03/18/2013  . Trochanteric bursitis of right hip 02/29/2012  . INSOMNIA 04/13/2010  . HYPERLIPIDEMIA 09/17/2007  . CERVICAL POLYP 09/17/2007  . ADD 09/11/2007  . UNSPECIFIED VENOUS INSUFFICIENCY 08/29/2007    Celyn Nilda Simmer, PT, MPH 04/13/2015, 4:23 PM  [facility] [place], [place], [address] Phone: [phone]   Fax:  [phone]

## 2015-04-15 ENCOUNTER — Ambulatory Visit (INDEPENDENT_AMBULATORY_CARE_PROVIDER_SITE_OTHER): Payer: BC Managed Care – PPO

## 2015-04-15 DIAGNOSIS — Z139 Encounter for screening, unspecified: Secondary | ICD-10-CM

## 2015-04-15 DIAGNOSIS — Z1231 Encounter for screening mammogram for malignant neoplasm of breast: Secondary | ICD-10-CM

## 2015-04-16 ENCOUNTER — Encounter: Payer: Self-pay | Admitting: Rehabilitative and Restorative Service Providers"

## 2015-04-16 ENCOUNTER — Ambulatory Visit (INDEPENDENT_AMBULATORY_CARE_PROVIDER_SITE_OTHER): Payer: BC Managed Care – PPO | Admitting: Rehabilitative and Restorative Service Providers"

## 2015-04-16 DIAGNOSIS — M25411 Effusion, right shoulder: Secondary | ICD-10-CM

## 2015-04-16 DIAGNOSIS — M542 Cervicalgia: Secondary | ICD-10-CM

## 2015-04-16 DIAGNOSIS — M25511 Pain in right shoulder: Secondary | ICD-10-CM

## 2015-04-16 DIAGNOSIS — M25611 Stiffness of right shoulder, not elsewhere classified: Secondary | ICD-10-CM

## 2015-04-16 DIAGNOSIS — M436 Torticollis: Secondary | ICD-10-CM | POA: Diagnosis not present

## 2015-04-16 DIAGNOSIS — R29898 Other symptoms and signs involving the musculoskeletal system: Secondary | ICD-10-CM

## 2015-04-16 NOTE — Therapy (Signed)
[facility] [place] [address] [place], [place], [address] Phone: [phone]   Fax:  [phone]  Physical Therapy Treatment  Patient Details  Name: Wanda Gonzales MRN: [id number] Date of Birth: Aug 26, 1956 Referring Provider:  Francena Hanly, MD  Encounter Date: 04/16/2015      PT End of Session - 04/16/15 1617    Visit Number 13   Number of Visits 16   Date for PT Re-Evaluation 04/28/15   PT Start Time 0330   PT Stop Time 0425   PT Time Calculation (min) 55 min   Activity Tolerance Patient tolerated treatment well;No increased pain      Past Medical History  Diagnosis Date  . Varicose veins   . PONV (postoperative nausea and vomiting) 02-23-12    always has PONV  . Post-nasal drainage 02-23-12    frequent a problem,causes a cough and mucus buildup in throat  . ADD (attention deficit disorder) 02-23-12    easily distracted.  . Venous insufficiency     chronic-denies any problems  . Arthritis 02-23-12    Rt. hip and lt. arm  . Abnormal Pap smear, atypical squamous cells of undetermined sign (ASC-US) 1998    treated with cryo, CIN I  . Urethral polyp 1998    Dr. Marcello Fennel    Past Surgical History  Procedure Laterality Date  . 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 Hand surgery  02-23-12    "blood clot" excised palm left hand 01-18-12  . Excision/release bursa hip  02/29/12  . Breast surgery  02-23-12    Rt. needle biopsy, recent -negative  path showed fibroadenoma  . Colonoscopy  08/2003    repeat 5 yrs.  . Colonoscopy w/ biopsies  12/2008    1 polyp recheck 5 yrs  . Knee arthroscopy w/ meniscal repair  10/2012  . Blood clot removed Left 01/2012    blood clot removed from left hand   . Knee arthroscopy with excision baker's cyst Left 10/2012    meniscal repair    There were no vitals filed for this visit.  Visit Diagnosis:  Pain in joint, shoulder region,  right  Stiffness of shoulder joint, right  Stiffness of neck  Weakness of shoulder  Swelling of shoulder joint, right  Pain, neck      Subjective Assessment - 04/16/15 1531    Subjective Shoulder is feeling good. Confident in continuing with I HEP. She has returned to all normal functional activities. She has been going to water aerobics and continues to work on her exercises at home.   Currently in Pain? No/denies            [facility] PT Assessment - 04/16/15 0001    Assessment   Medical Diagnosis Rt shoulder scope, SAD, age 59   Onset Date/Surgical Date 02/24/15   Next MD Visit 04/27/15   Observation/Other Assessments   Focus on Therapeutic Outcomes (FOTO)  27% limitation    Posture/Postural Control   Posture Comments improved postrue/ continues to demonstrate forward flexed posture with head forward; shoulders rounded and elevated ; head of the humerus anterior in orientation; increased thoracici kyphosis   AROM   Right Shoulder Flexion 152 Degrees   Right Shoulder ABduction 128 Degrees   Right Shoulder External Rotation 90 Degrees   Cervical - Right Rotation 70   Cervical - Left Rotation 73   PROM   Right Shoulder Flexion 165 Degrees  Right Shoulder ABduction 143 Degrees   Right Shoulder Internal Rotation 75 Degrees   Right Shoulder External Rotation 93 Degrees   Strength   Right Shoulder Flexion 5/5   Right Shoulder Extension 5/5   Right Shoulder ABduction --  5-/5   Right Shoulder Internal Rotation 5/5   Right Shoulder External Rotation 5/5   Palpation   Palpation comment tightness through the pecs; trap; leveator; anterior chest; deltoid                     [facility] Adult PT Treatment/Exercise - 04/16/15 0001    Self-Care   Self-Care --  Reviewed ADL's/positions/self care   Neuro Re-ed    Neuro Re-ed Details  working on posture and alignment working to recruit posterior shoulder girdle musculature    Shoulder Exercises: Therapy Ball   Flexion 5 reps    Flexion Limitations bouncing small ball on wall    Other Therapy Ball Exercises ball on wall for stabilization circles CW and CCW/diagonals 1 min 2 reps   Shoulder Exercises: ROM/Strengthening   UBE (Upper Arm Bike) L3 1 min forward/1 min back x2    Wall Pushups 20 reps   Shoulder Exercises: Stretch   Other Shoulder Stretches doorway stretch x3 positionsl 30 sec one rep    Shoulder Exercises: Body Blade   Flexion 60 seconds;2 reps   Cryotherapy   Number Minutes Cryotherapy 15 Minutes   Cryotherapy Location Shoulder;Cervical   Type of Cryotherapy Ice pack   Electrical Stimulation   Electrical Stimulation Location Rt shoulder   Electrical Stimulation Action IFC   Electrical Stimulation Parameters To tolerance    Electrical Stimulation Goals Pain   Manual Therapy   Manual Therapy Soft tissue mobilization;Myofascial release;Joint mobilization;Scapular mobilization   Manual therapy comments working through Rt shoulder girdle with patient in supine   Joint Mobilization Rt scapular mobs   Myofascial Release focus through pecs/deltoid/traps/leveator   Passive ROM Rt shoulder                 PT Education - 04/16/15 1616    Education provided Yes   Education Details Encouraged Wanda Gonzales age 59 to continue work on posture and alignment; continue HEP; continue with water exercise program; continue to use UE's for functional activities.    Person(s) Educated Patient   Methods Explanation   Comprehension Verbalized understanding          PT Short Term Goals - 04/16/15 1619    PT SHORT TERM GOAL #1   Title I with initial HEP   Time 4   Period Weeks   Status Achieved   PT SHORT TERM GOAL #2   Title report neck pain decrease =/> 75% to ease head turning with driving   Time 4   Period Weeks   Status Achieved   PT SHORT TERM GOAL #3   Title demo Rt shoulder ROM WFL   Time 4   Period Weeks   Status Achieved   PT SHORT TERM GOAL #4   Title improve FOTO =/< 40%   Time 4   Period  Weeks   Status Achieved           PT Long Term Goals - 04/16/15 1619    PT LONG TERM GOAL #1   Title I with advanced HEP   Time 8   Period Weeks   Status Achieved   PT LONG TERM GOAL #2   Title Report pain decrease to allow her perform overhead activities  Baseline overhead activities with pool exercise    Time 8   Period Weeks   Status Achieved   PT LONG TERM GOAL #3   Title demo Rt shoulder strength =/> 5-/5   Time 8   Period Weeks   Status Achieved   PT LONG TERM GOAL #4   Title demo cervical rotation =/> 60 degrees in each direction   Time 8   Period Weeks   Status Achieved               Plan - 04/16/15 1618    Clinical Impression Statement D/C PT - patient has accomplished goals of therapy and is confident with continuing with I HEP. She will call with any questions.   Pt will benefit from skilled therapeutic intervention in order to improve on the following deficits Decreased range of motion;Impaired UE functional use;Pain;Postural dysfunction;Decreased strength   Rehab Potential Good   PT Frequency 2x / week   PT Duration 8 weeks   PT Treatment/Interventions ADLs/Self Care Home Management;Cryotherapy;Electrical Stimulation;Iontophoresis 4mg /ml Dexamethasone;Ultrasound;Moist Heat;Therapeutic exercise;Manual techniques;Patient/family education   PT Next Visit Plan D/C goals accomplished   PT Home Exercise Plan HEP/functional activities   Consulted and Agree with Plan of Care Patient        Problem List Patient Active Problem List   Diagnosis Date Noted  . Cervical strain 11/21/2014  . Right shoulder pain 05/15/2014  . DDD (degenerative disc disease), lumbar 02/17/2014  . Baker's cyst 03/18/2013  . Unspecified vitamin D deficiency 03/18/2013  . Trochanteric bursitis of right hip 02/29/2012  . INSOMNIA 04/13/2010  . HYPERLIPIDEMIA 09/17/2007  . CERVICAL POLYP 09/17/2007  . ADD 09/11/2007  . UNSPECIFIED VENOUS INSUFFICIENCY 08/29/2007    Gemayel Mascio  Rober Minion, PT, MPH 04/16/2015, 6:00 PM  [facility] [address] [place], [place], [address] Phone: [phone]   Fax:  [phone]  PHYSICAL THERAPY DISCHARGE SUMMARY  Visits from Start of Care: *13  Current functional level related to goals / functional outcomes: Goals accomplished. Pt I in HEP and has returned to all normal functional activities. Conficent in continuing with I home program   Remaining deficits: Poor posture   Education / Equipment: HEP  Plan: Patient agrees to discharge.  Patient goals were met. Patient is being discharged due to meeting the stated rehab goals.  ?????   Taro Hidrogo P. Krithika Tome,PT, MPH 04/16/15 6:10 pm

## 2015-04-20 ENCOUNTER — Ambulatory Visit (INDEPENDENT_AMBULATORY_CARE_PROVIDER_SITE_OTHER): Payer: BC Managed Care – PPO | Admitting: Family Medicine

## 2015-04-20 ENCOUNTER — Encounter: Payer: Self-pay | Admitting: Family Medicine

## 2015-04-20 VITALS — BP 109/72 | HR 82 | Ht 61.0 in | Wt 149.0 lb

## 2015-04-20 DIAGNOSIS — F341 Dysthymic disorder: Secondary | ICD-10-CM

## 2015-04-20 DIAGNOSIS — M25511 Pain in right shoulder: Secondary | ICD-10-CM

## 2015-04-20 DIAGNOSIS — F909 Attention-deficit hyperactivity disorder, unspecified type: Secondary | ICD-10-CM

## 2015-04-20 DIAGNOSIS — F988 Other specified behavioral and emotional disorders with onset usually occurring in childhood and adolescence: Secondary | ICD-10-CM

## 2015-04-20 DIAGNOSIS — R4589 Other symptoms and signs involving emotional state: Secondary | ICD-10-CM

## 2015-04-20 DIAGNOSIS — R4581 Low self-esteem: Secondary | ICD-10-CM

## 2015-04-20 MED ORDER — MELOXICAM 15 MG PO TABS: 15.0000 mg | ORAL_TABLET | Freq: Every day | ORAL | Status: DC | PRN

## 2015-04-20 MED ORDER — LISDEXAMFETAMINE DIMESYLATE 30 MG PO CAPS: 30.0000 mg | ORAL_CAPSULE | Freq: Every day | ORAL | Status: DC
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
## 2015-05-04 ENCOUNTER — Other Ambulatory Visit: Payer: Self-pay | Admitting: Nurse Practitioner

## 2015-05-04 NOTE — Telephone Encounter (Addendum)
Medication refill request: Prozac 10 mg  Last AEX:  02/27/14 with PG Next AEX: No AEX scheduled  Last MMG (if hormonal medication request): n/a Refill authorized: ?  Left Message To Call Back

## 2015-05-05 ENCOUNTER — Telehealth: Payer: Self-pay | Admitting: Family Medicine

## 2015-05-05 NOTE — Telephone Encounter (Signed)
Received fax for prior authorization on Vyvanse 30 mg sent through cover my meds and received authorization from 04/14/2015 - 05/04/2016. Case ID [id number]. Pharmacy has been notified - [facility]

## 2015-05-11 NOTE — Telephone Encounter (Signed)
Called patient to try and schedule AEX no call back please advise.

## 2015-05-12 ENCOUNTER — Telehealth: Payer: Self-pay | Admitting: *Deleted

## 2015-05-12 NOTE — Telephone Encounter (Signed)
Reviewed with Dr Quincy Simmonds. Follow up call to patient to schedule pelvic ultrasound for recurrent PMB.  Patietn states she has not had any bleeding in over a year and she does not feel this is necessary. States Patty told her at annual exam that no follow-up was necessary. Advised supervising MD reviews chart, all PMB warrants evaluation to rule out abnormal cells including cancer Advised that amount of bleeding is not indicative of degree of abnormality. Patient states she is due for annual and would like to schedule this and discuss. Last annual June 2015.  Scheduled annual exam on 05-14-15 with Dr Talbert Nan so that work-up for PMB could be discussed and performed by same provider. Patient agreeable to plan.  Routing to provider for final review.  Will close encounter.   CC: Dr Talbert Nan

## 2015-05-14 ENCOUNTER — Encounter: Payer: Self-pay | Admitting: Obstetrics and Gynecology

## 2015-05-14 ENCOUNTER — Ambulatory Visit (INDEPENDENT_AMBULATORY_CARE_PROVIDER_SITE_OTHER): Payer: BC Managed Care – PPO | Admitting: Obstetrics and Gynecology

## 2015-05-14 VITALS — BP 96/60 | HR 92 | Resp 18 | Ht 61.25 in | Wt 149.0 lb

## 2015-05-14 DIAGNOSIS — N951 Menopausal and female climacteric states: Secondary | ICD-10-CM | POA: Diagnosis not present

## 2015-05-14 DIAGNOSIS — Z124 Encounter for screening for malignant neoplasm of cervix: Secondary | ICD-10-CM | POA: Diagnosis not present

## 2015-05-14 DIAGNOSIS — M8580 Other specified disorders of bone density and structure, unspecified site: Secondary | ICD-10-CM

## 2015-05-14 DIAGNOSIS — F329 Major depressive disorder, single episode, unspecified: Secondary | ICD-10-CM | POA: Diagnosis not present

## 2015-05-14 DIAGNOSIS — N950 Postmenopausal bleeding: Secondary | ICD-10-CM | POA: Diagnosis not present

## 2015-05-14 DIAGNOSIS — Z01419 Encounter for gynecological examination (general) (routine) without abnormal findings: Secondary | ICD-10-CM | POA: Diagnosis not present

## 2015-05-14 DIAGNOSIS — F32A Depression, unspecified: Secondary | ICD-10-CM

## 2015-05-14 MED ORDER — FLUOXETINE HCL 20 MG PO CAPS: 20.0000 mg | ORAL_CAPSULE | Freq: Every day | ORAL | Status: DC

## 2015-05-14 NOTE — Patient Instructions (Signed)

## 2015-05-14 NOTE — Progress Notes (Signed)
59 y.o. G0P0 MarriedCaucasianF here for annual exam.  On 04/30/13 had PUS and attempt at endo biopsy. The end result was because she had a thin endo lining just watchful waiting Then no bleeding until 12/18/13. Lasted 1 days red and moderate to light. No cramps or PMS.  No bleeding since then. She feels fine. Sexually active, no pain. She does c/o recurrent vasomotor symptoms, tolerable.   Patient's last menstrual period was 12/18/2013.          Sexually active: Yes.    The current method of family planning is post menopausal status.    Exercising: Yes.    Swim, walk Smoker:  no  Health Maintenance: Pap:  2013 Neg. HR HPV:neg History of abnormal Pap:  Yes,ASCUS, CIN I  MMG:  04/16/15 BIRADS1:neg Colonoscopy:  10/2014 Normal - Repeat 5 years  BMD:   01/08/15 Osteopenia  TDaP:  2010 Screening Labs: PCP, Hb today: PCP, Urine today: PCP   reports that she has never smoked. She has never used smokeless tobacco. She reports that she drinks about 0.6 oz of alcohol per week. She reports that she does not use illicit drugs.  Past Medical History  Diagnosis Date  . Varicose veins   . PONV (postoperative nausea and vomiting) 02-23-12    always has PONV  . Post-nasal drainage 02-23-12    frequent a problem,causes a cough and mucus buildup in throat  . ADD (attention deficit disorder) 02-23-12    easily distracted.  . Venous insufficiency     chronic-denies any problems  . Arthritis 02-23-12    Rt. hip and lt. arm  . Abnormal Pap smear, atypical squamous cells of undetermined sign (ASC-US) 1998    treated with cryo, CIN I  . Urethral polyp 1998    Dr. Hartley Barefoot    Past Surgical History  Procedure Laterality Date  . 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 Hand surgery  02-23-12    "blood clot" excised palm left hand 01-18-12  . Excision/release bursa hip  02/29/12  . Breast surgery  02-23-12    Rt. needle biopsy, recent -negative  path showed  fibroadenoma  . Colonoscopy  08/2003    repeat 5 yrs.  . Colonoscopy w/ biopsies  12/2008    1 polyp recheck 5 yrs  . Knee arthroscopy w/ meniscal repair  10/2012  . Blood clot removed Left 01/2012    blood clot removed from left hand   . Knee arthroscopy with excision baker's cyst Left 10/2012    meniscal repair  . Shoulder surgery Right 02/2015    Dr. Lennette Bihari Supple - [facility]    Current Outpatient Prescriptions  Medication Sig Dispense Refill  . Ascorbic Acid (VITAMIN C) 250 MG CHEW Chew 1 tablet by mouth daily with breakfast. gummie.    . fish oil-omega-3 fatty acids 1000 MG capsule Take 4 g by mouth daily.     Marland Kitchen FLUoxetine (PROZAC) 10 MG tablet TAKE 1 TABLET (10 MG TOTAL) BY MOUTH AT BEDTIME. 90 tablet 1  . lisdexamfetamine (VYVANSE) 30 MG capsule Take 1 capsule (30 mg total) by mouth daily. 30 capsule 0  . meloxicam (MOBIC) 15 MG tablet Take 1 tablet (15 mg total) by mouth daily as needed for pain. 30 tablet 6  . Multiple Vitamin (MULTIVITAMIN) tablet Take 1 tablet by mouth daily.     Marland Kitchen OVER THE COUNTER MEDICATION Take 2 tablets by mouth daily with breakfast.  Vitamin D gummie     No current facility-administered medications for this visit.    Family History  Problem Relation Age of Onset  . Breast cancer Mother 41    breast  . Hyperlipidemia Mother   . Brain cancer Father 37    brain  . Osteoporosis Maternal Grandmother   . Drug abuse Brother    SH: retired Pharmacist, hospital   ROS:  Pertinent items are noted in HPI.  Otherwise, a comprehensive ROS was negative.  Exam:   BP 96/60 mmHg  Pulse 92  Resp 18  Ht 5' 1.25" (1.556 m)  Wt 149 lb (67.586 kg)  BMI 27.91 kg/m2  LMP 12/18/2013  Weight change: @WEIGHTCHANGE @ Height:   Height: 5' 1.25" (155.6 cm)  Ht Readings from Last 3 Encounters:  05/14/15 5' 1.25" (1.556 m)  04/20/15 5\' 1"  (1.549 m)  12/22/14 5\' 1"  (1.549 m)    General appearance: alert, cooperative and appears stated age Head: Normocephalic, without obvious  abnormality, atraumatic Neck: no adenopathy, supple, symmetrical, trachea midline and thyroid normal to inspection and palpation Lungs: clear to auscultation bilaterally Breasts: normal appearance, no masses or tenderness Heart: regular rate and rhythm Abdomen: soft, non-tender; bowel sounds normal; no masses,  no organomegaly Extremities: extremities normal, atraumatic, no cyanosis or edema Skin: Skin color, texture, turgor normal. No rashes or lesions Lymph nodes: Cervical, supraclavicular, and axillary nodes normal. No abnormal inguinal nodes palpated Neurologic: Grossly normal   Pelvic: External genitalia:  no lesions              Urethra:  normal appearing urethra with no masses, tenderness or lesions              Bartholins and Skenes: normal                 Vagina: mildly atrophic vagina with normal color and discharge, no lesions              Cervix: no lesions              Pap taken: Yes.   Bimanual Exam:  Uterus:  normal size, contour, position, consistency, mobility, non-tender              Adnexa: normal adnexa and no mass, fullness, tenderness               Rectovaginal: Confirms               Anus:  normal sphincter tone, no lesions  Chaperone was present for exam.  A:  Well Woman with normal exam  Depression, would like to increase the prozac dose (currently on 10 mg)  Screening cervical cancer  H/O PMP bleeding last year, never evaluated, recommended she return for an ultrasound  Vasomotor symptoms  Osteopenia  P:   Pap with hpv  Return for a gyn ultrasound  DEXA due in 4/18  Calcium 1,200 mg a day, continue vit D  Mammogram and colonoscopy UTD  Discussed behavioral changes for vasomotor symptoms

## 2015-05-18 LAB — IPS PAP TEST WITH HPV

## 2015-05-19 ENCOUNTER — Telehealth: Payer: Self-pay | Admitting: Obstetrics and Gynecology

## 2015-05-19 NOTE — Telephone Encounter (Signed)
Spoke with patient. Patient would like to schedule PUS at this time. Appointment scheduled for 05/26/2015 at 2 pm with 2:30 pm consult with Dr.Jertson. Patient is agreeable to date and time. Patient verbalized understanding of the [place] appointment cancellation policy. Advised will need to cancel or reschedule within 72 business hours of appointment (3 business days) or will have $100.00 late cancellation fee placed to account.  Routing to provider for final review. Patient agreeable to disposition. Will close encounter.

## 2015-05-19 NOTE — Telephone Encounter (Signed)
Call placed to patient to review benefits for PUS. She is ready to proceed with scheduling at this time.

## 2015-05-21 ENCOUNTER — Telehealth: Payer: Self-pay | Admitting: Obstetrics and Gynecology

## 2015-05-21 NOTE — Telephone Encounter (Signed)
Patient aware of benefit. Understands and agreeable. Verified appt date/time. Ok to close

## 2015-05-21 NOTE — Telephone Encounter (Signed)
Returning a call to Becky

## 2015-05-21 NOTE — Telephone Encounter (Signed)
Returned call and will try back this afternoon.

## 2015-05-21 NOTE — Telephone Encounter (Signed)
Called patient to review benefits for procedure. Left voicemail to call back and review. °

## 2015-05-26 ENCOUNTER — Ambulatory Visit (INDEPENDENT_AMBULATORY_CARE_PROVIDER_SITE_OTHER): Payer: BC Managed Care – PPO | Admitting: Obstetrics and Gynecology

## 2015-05-26 ENCOUNTER — Ambulatory Visit (INDEPENDENT_AMBULATORY_CARE_PROVIDER_SITE_OTHER): Payer: BC Managed Care – PPO

## 2015-05-26 ENCOUNTER — Encounter: Payer: Self-pay | Admitting: Obstetrics and Gynecology

## 2015-05-26 VITALS — BP 92/68 | HR 84 | Resp 14 | Wt 149.0 lb

## 2015-05-26 DIAGNOSIS — N950 Postmenopausal bleeding: Secondary | ICD-10-CM

## 2015-05-26 NOTE — Progress Notes (Signed)
     S: the patient had an episode of PMP bleeding in 4/15 without evaluation. No bleeding since then.   Past Medical History  Diagnosis Date  . Varicose veins   . PONV (postoperative nausea and vomiting) 02-23-12    always has PONV  . Post-nasal drainage 02-23-12    frequent a problem,causes a cough and mucus buildup in throat  . ADD (attention deficit disorder) 02-23-12    easily distracted.  . Venous insufficiency     chronic-denies any problems  . Arthritis 02-23-12    Rt. hip and lt. arm  . Abnormal Pap smear, atypical squamous cells of undetermined sign (ASC-US) 1998    treated with cryo, CIN I  . Urethral polyp 1998    Dr. Hartley Barefoot   Past Surgical History  Procedure Laterality Date  . 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 Hand surgery  02-23-12    "blood clot" excised palm left hand 01-18-12  . Excision/release bursa hip  02/29/12  . Breast surgery  02-23-12    Rt. needle biopsy, recent -negative  path showed fibroadenoma  . Colonoscopy  08/2003    repeat 5 yrs.  . Colonoscopy w/ biopsies  12/2008    1 polyp recheck 5 yrs  . Knee arthroscopy w/ meniscal repair  10/2012  . Blood clot removed Left 01/2012    blood clot removed from left hand   . Knee arthroscopy with excision baker's cyst Left 10/2012    meniscal repair  . Shoulder surgery Right 02/2015    Dr. Lennette Bihari Supple - [facility]   O: General: alert white female in NAD BP 92/68, P 84, R 14, wt 149 lbs  Ultrasound images reviewed with the patient, thin endometrial stripe, normal ultrasound.  A/P Episode of PMP bleeding, thin stripe on ultrasound  No further evaluation needed at this time  F/u next year or as needed

## 2015-06-01 ENCOUNTER — Encounter: Payer: Self-pay | Admitting: Family Medicine

## 2015-06-01 ENCOUNTER — Ambulatory Visit (INDEPENDENT_AMBULATORY_CARE_PROVIDER_SITE_OTHER): Payer: BC Managed Care – PPO | Admitting: Family Medicine

## 2015-06-01 VITALS — BP 104/72 | HR 83 | Temp 99.0°F | Ht 61.0 in | Wt 149.0 lb

## 2015-06-01 DIAGNOSIS — E785 Hyperlipidemia, unspecified: Secondary | ICD-10-CM | POA: Diagnosis not present

## 2015-06-01 DIAGNOSIS — Z114 Encounter for screening for human immunodeficiency virus [HIV]: Secondary | ICD-10-CM

## 2015-06-01 DIAGNOSIS — F909 Attention-deficit hyperactivity disorder, unspecified type: Secondary | ICD-10-CM

## 2015-06-01 DIAGNOSIS — Z1159 Encounter for screening for other viral diseases: Secondary | ICD-10-CM

## 2015-06-01 DIAGNOSIS — Z23 Encounter for immunization: Secondary | ICD-10-CM | POA: Diagnosis not present

## 2015-06-01 DIAGNOSIS — F988 Other specified behavioral and emotional disorders with onset usually occurring in childhood and adolescence: Secondary | ICD-10-CM

## 2015-06-01 MED ORDER — LISDEXAMFETAMINE DIMESYLATE 30 MG PO CAPS
30.0000 mg | ORAL_CAPSULE | Freq: Every day | ORAL | Status: DC
Start: 2015-06-01 — End: 2015-06-01

## 2015-06-01 MED ORDER — LISDEXAMFETAMINE DIMESYLATE 30 MG PO CAPS: 30.0000 mg | ORAL_CAPSULE | Freq: Every day | ORAL | Status: DC

## 2015-06-01 NOTE — Progress Notes (Signed)
   Subjective:    Patient ID: Wanda Gonzales, female    DOB: [birth year], 59 y.o.   MRN: [id number]  HPI ADD- she is doing well.  It took almost 2.5 weeks.  She has been on it for aobut 2 weeks.  She is not clenching her jaws. She is sleeping well.  Says was giving her a mild HA initially but now she is feelig better.  Still occ feeling overwhelmed.  She does feel more organized. Doesn't feel as weepy.  Feels like she gets overwhelmed easily.  Sleeping well. No CP or heart flutters.  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   hyperlipidemia-she does take fish oil.  Review of Systems     Objective:   Physical Exam  Constitutional: She is oriented to person, place, and time. She appears well-developed and well-nourished.  HENT:  Head: Normocephalic and atraumatic.  Cardiovascular: Normal rate, regular rhythm and normal heart sounds.   Pulmonary/Chest: Effort normal and breath sounds normal.  Neurological: She is alert and oriented to person, place, and time.  Skin: Skin is warm and dry.  Psychiatric: She has a normal mood and affect. Her behavior is normal.          Assessment & Plan:  ADD -  So far she's doing well with it. Blood pressure is stable. It's not causing any side effects currently. We discussed continuing current dose versus increasing her regimen. She wants to complete the 2 weeks of the 30 mg before making a decision. I did go ahead and give her prescriptions for the 30 mg tabs if she doesn't have to come back if she does want to increase her dose then she can just call but she will need to bring the old prescriptions back in.   hyperlipidemia-due to recheck lipid panel. Lab slip provided today.   Recommend routine screening for HIV and hepatitis C. She   Flu vaccine given today.

## 2015-07-14 ENCOUNTER — Ambulatory Visit (INDEPENDENT_AMBULATORY_CARE_PROVIDER_SITE_OTHER): Payer: BC Managed Care – PPO | Admitting: Family Medicine

## 2015-07-14 ENCOUNTER — Encounter: Payer: Self-pay | Admitting: Family Medicine

## 2015-07-14 VITALS — BP 108/66 | HR 76 | Temp 97.7°F | Wt 151.0 lb

## 2015-07-14 DIAGNOSIS — F909 Attention-deficit hyperactivity disorder, unspecified type: Secondary | ICD-10-CM

## 2015-07-14 DIAGNOSIS — F988 Other specified behavioral and emotional disorders with onset usually occurring in childhood and adolescence: Secondary | ICD-10-CM

## 2015-07-14 DIAGNOSIS — S46811A Strain of other muscles, fascia and tendons at shoulder and upper arm level, right arm, initial encounter: Secondary | ICD-10-CM | POA: Diagnosis not present

## 2015-07-14 MED ORDER — LISDEXAMFETAMINE DIMESYLATE 40 MG PO CAPS: 40.0000 mg | ORAL_CAPSULE | Freq: Every day | ORAL | Status: DC

## 2015-07-14 MED ORDER — CYCLOBENZAPRINE HCL 10 MG PO TABS: 10.0000 mg | ORAL_TABLET | Freq: Three times a day (TID) | ORAL | Status: DC | PRN

## 2015-07-14 MED ORDER — METAXALONE 800 MG PO TABS: 800.0000 mg | ORAL_TABLET | Freq: Three times a day (TID) | ORAL | Status: DC | PRN

## 2015-07-14 NOTE — Progress Notes (Signed)
   Subjective:    Patient ID: Wanda Gonzales, female    DOB: 1956/05/01, 59 y.o.   MRN: [id number]  HPI Over the weekend her right shoulder started hurting while [place].  Woke up this AM and felt it was a little better. No injury or trauma.  Just had right shoulder surgery with Dr. Justice Britain at [facility] for a labral tear.     ADD - she's doing well on the Vyvanse without any chest pain, shortness of breath or palpitations. Is not affecting her sleep. She would actually like to try increasing her dose to 40 mg and we have discussed previously.    nReview of Systems     Objective:   Physical Exam  Constitutional: She is oriented to person, place, and time. She appears well-developed and well-nourished.  HENT:  Head: Normocephalic and atraumatic.  Eyes: Conjunctivae and EOM are normal.  Cardiovascular: Normal rate.   Pulmonary/Chest: Effort normal.  Musculoskeletal:  Neck with normal flexion, decreased extension. Decreased rotation on the left compared to the right. Decreased side bending bilaterally. The stretches caused discomfort. She is nontender of the cervical spine but very tender over the right paraspinous cervical muscles. Also tender over the upper trapezius. Shoulder with normal extension and rotation. Strength is 5 out of 5 at the shoulder and elbow and wrist.  Neurological: She is alert and oriented to person, place, and time.  Skin: Skin is dry. No pallor.  Psychiatric: She has a normal mood and affect. Her behavior is normal.  Vitals reviewed.         Assessment & Plan:   right upper trapezius strain-work on exercises which are range of motion. Recommend heat and ice. Recommend start her anti-inflammatory and prescribed a muscle relaxer. Offered to refer her to PT but she declined at this time will call back if she's not improving over the next 2-3 weeks. Next  ADD-we'll increase Vyvanse to 40 mg and follow back up in 1-2 months to make  sure that she's doing well on the increased dose.

## 2015-07-14 NOTE — Patient Instructions (Addendum)
Trapezius strain  Call if not better in 2-3 week.  Ok to start NSAID like Aleve and Ibuprofen.  Sent a muscle relaxer over to the pharmacy.

## 2015-07-21 DIAGNOSIS — N200 Calculus of kidney: Secondary | ICD-10-CM

## 2015-07-21 HISTORY — DX: Calculus of kidney: N20.0

## 2015-07-25 ENCOUNTER — Other Ambulatory Visit: Payer: Self-pay | Admitting: Obstetrics and Gynecology

## 2015-07-27 NOTE — Telephone Encounter (Signed)
Please call and see how she is doing on the 20 mg of Prozac, this is an increase from 10 mg. If she is doing okay, you can call in 20 mg, #90 with 2 refills. Thanks.

## 2015-07-27 NOTE — Telephone Encounter (Signed)
Medication refill request: prozac 20mg  Last AEX:  05-14-15 Next AEX: 05-19-16 Last MMG (if hormonal medication request): 04-15-15 category c density,birads 1:neg Refill authorized: pt was given #30 with 1 refill at [facility] on 05-14-15. Please approve rx if patient can have more along with number of refills.

## 2015-07-28 NOTE — Telephone Encounter (Signed)
Message left to return call to Tran Arzuaga at [phone].    

## 2015-07-31 ENCOUNTER — Other Ambulatory Visit: Payer: Self-pay

## 2015-07-31 MED ORDER — FLUOXETINE HCL 20 MG PO CAPS: 20.0000 mg | ORAL_CAPSULE | Freq: Every day | ORAL | Status: DC

## 2015-08-06 NOTE — Telephone Encounter (Signed)
Patient did not return call and Prozac 20 mg was refilled by PCP for one year on 07/31/15. Will close encounter.

## 2015-08-18 ENCOUNTER — Other Ambulatory Visit: Payer: Self-pay

## 2015-08-18 MED ORDER — LISDEXAMFETAMINE DIMESYLATE 40 MG PO CAPS: 40.0000 mg | ORAL_CAPSULE | Freq: Every day | ORAL | Status: DC

## 2015-08-31 ENCOUNTER — Encounter: Payer: Self-pay | Admitting: Family Medicine

## 2015-08-31 ENCOUNTER — Ambulatory Visit (INDEPENDENT_AMBULATORY_CARE_PROVIDER_SITE_OTHER): Payer: BC Managed Care – PPO | Admitting: Family Medicine

## 2015-08-31 VITALS — BP 104/63 | HR 80 | Wt 147.0 lb

## 2015-08-31 DIAGNOSIS — F909 Attention-deficit hyperactivity disorder, unspecified type: Secondary | ICD-10-CM

## 2015-08-31 DIAGNOSIS — H01139 Eczematous dermatitis of unspecified eye, unspecified eyelid: Secondary | ICD-10-CM | POA: Diagnosis not present

## 2015-08-31 DIAGNOSIS — F988 Other specified behavioral and emotional disorders with onset usually occurring in childhood and adolescence: Secondary | ICD-10-CM

## 2015-08-31 MED ORDER — LISDEXAMFETAMINE DIMESYLATE 40 MG PO CAPS: 40.0000 mg | ORAL_CAPSULE | Freq: Every day | ORAL | Status: DC

## 2015-08-31 NOTE — Progress Notes (Signed)
   Subjective:    Patient ID: Wanda Gonzales, female    DOB: May 07, 1956, 59 y.o.   MRN: [id number]  HPI F/U ADD - No CP or SOB.  She is very hapy with the Vyanse and doing well with it. Says it is much better than the Aderall.  Says has had decreased appetite , but is not skipping any meals..  Says it is not affecting her sleep.    Eyes are red and itchy - it's mostly on her upper eyelids. She has not used any new products.it started yesterday.   Review of Systems     Objective:   Physical Exam  Constitutional: She is oriented to person, place, and time. She appears well-developed and well-nourished.  HENT:  Head: Normocephalic and atraumatic.  Cardiovascular: Normal rate, regular rhythm and normal heart sounds.   Pulmonary/Chest: Effort normal and breath sounds normal.  Neurological: She is alert and oriented to person, place, and time.  Skin: Skin is warm and dry.  Psychiatric: She has a normal mood and affect. Her behavior is normal.          Assessment & Plan:  ADD - Doing well on current regimen.  F/U in 3 months.  Bp well controlled. She lost about 4 lbs.    We'll keep an eye on the weight loss. She is not skipping any meals though.   Eyelid dermatitis-recommend a trial of over-the-counter hydrocortisone cream applied to just below the eyebrow ridge. Not directly on the upper lid. And make sure that she takes a look at her face soaps wash, makeup etc. To see if this might be something that is triggering an allergic reaction.

## 2015-08-31 NOTE — Patient Instructions (Signed)
Dietary Guidelines to Help Prevent Kidney Stones Your risk of kidney stones can be decreased by adjusting the foods you eat. The most important thing you can do is drink enough fluid. You should drink enough fluid to keep your urine clear or pale yellow. The following guidelines provide specific information for the type of kidney stone you have had. GUIDELINES ACCORDING TO TYPE OF KIDNEY STONE Calcium Oxalate Kidney Stones  Reduce the amount of salt you eat. Foods that have a lot of salt cause your body to release excess calcium into your urine. The excess calcium can combine with a substance called oxalate to form kidney stones.  Reduce the amount of animal protein you eat if the amount you eat is excessive. Animal protein causes your body to release excess calcium into your urine. Ask your dietitian how much protein from animal sources you should be eating.  Avoid foods that are high in oxalates. If you take vitamins, they should have less than 500 mg of vitamin C. Your body turns vitamin C into oxalates. You do not need to avoid fruits and vegetables high in vitamin C. Calcium Phosphate Kidney Stones  Reduce the amount of salt you eat to help prevent the release of excess calcium into your urine.  Reduce the amount of animal protein you eat if the amount you eat is excessive. Animal protein causes your body to release excess calcium into your urine. Ask your dietitian how much protein from animal sources you should be eating.  Get enough calcium from food or take a calcium supplement (ask your dietitian for recommendations). Food sources of calcium that do not increase your risk of kidney stones include:  Broccoli.  Dairy products, such as cheese and yogurt.  Pudding. Uric Acid Kidney Stones  Do not have more than 6 oz of animal protein per day. FOOD SOURCES Animal Protein Sources  Meat (all types).  Poultry.  Eggs.  Fish, seafood. Foods High in Salt  Salt seasonings.  Soy  sauce.  Teriyaki sauce.  Cured and processed meats.  Salted crackers and snack foods.  Fast food.  Canned soups and most canned foods. Foods High in Oxalates  Grains:  Amaranth.  Barley.  Grits.  Wheat germ.  Bran.  Buckwheat flour.  All bran cereals.  Pretzels.  Whole wheat bread.  Vegetables:  Beans (wax).  Beets and beet greens.  Collard greens.  Eggplant.  Escarole.  Leeks.  Okra.  Parsley.  Rutabagas.  Spinach.  Swiss chard.  Tomato paste.  Fried potatoes.  Sweet potatoes.  Fruits:  Red currants.  Figs.  Kiwi.  Rhubarb.  Meat and Other Protein Sources:  Beans (dried).  Soy burgers and other soybean products.  Miso.  Nuts (peanuts, almonds, pecans, cashews, hazelnuts).  Nut butters.  Sesame seeds and tahini (paste made of sesame seeds).  Poppy seeds.  Beverages:  Chocolate drink mixes.  Soy milk.  Instant iced tea.  Juices made from high-oxalate fruits or vegetables.  Other:  Carob.  Chocolate.  Fruitcake.  Marmalades.   This information is not intended to replace advice given to you by your health care provider. Make sure you discuss any questions you have with your health care provider.   Document Released: 12/31/2010 Document Revised: 09/10/2013 Document Reviewed: 08/02/2013 Elsevier Interactive Patient Education 2016 Elsevier Inc.  

## 2015-09-09 ENCOUNTER — Ambulatory Visit: Payer: BC Managed Care – PPO | Admitting: Family Medicine

## 2015-12-16 ENCOUNTER — Ambulatory Visit (INDEPENDENT_AMBULATORY_CARE_PROVIDER_SITE_OTHER): Payer: BC Managed Care – PPO

## 2015-12-16 ENCOUNTER — Ambulatory Visit (INDEPENDENT_AMBULATORY_CARE_PROVIDER_SITE_OTHER): Payer: BC Managed Care – PPO | Admitting: Family Medicine

## 2015-12-16 ENCOUNTER — Encounter: Payer: Self-pay | Admitting: Family Medicine

## 2015-12-16 ENCOUNTER — Telehealth: Payer: Self-pay | Admitting: Family Medicine

## 2015-12-16 VITALS — BP 126/82 | HR 103 | Wt 149.0 lb

## 2015-12-16 DIAGNOSIS — G8929 Other chronic pain: Secondary | ICD-10-CM | POA: Insufficient documentation

## 2015-12-16 DIAGNOSIS — M549 Dorsalgia, unspecified: Secondary | ICD-10-CM | POA: Insufficient documentation

## 2015-12-16 DIAGNOSIS — M7121 Synovial cyst of popliteal space [Baker], right knee: Secondary | ICD-10-CM

## 2015-12-16 DIAGNOSIS — M545 Low back pain, unspecified: Secondary | ICD-10-CM

## 2015-12-16 DIAGNOSIS — W19XXXA Unspecified fall, initial encounter: Secondary | ICD-10-CM | POA: Insufficient documentation

## 2015-12-16 DIAGNOSIS — M5136 Other intervertebral disc degeneration, lumbar region: Secondary | ICD-10-CM | POA: Diagnosis not present

## 2015-12-16 NOTE — Assessment & Plan Note (Signed)
Fall at work. Doubtful for compression fractures patient is not tender at the T11 area seen on x-ray. Plan for physical therapy meloxicam and Skelaxin for pain control. Recheck in a few weeks

## 2015-12-16 NOTE — Progress Notes (Signed)
Wanda Gonzales is a 60 y.o. female who presents to [facility] today for fall.  Patient fell at work today tripping over a rug landing on her right side. She notes bilateral low back pain. Pain is moderate. She denies any radiating pain weakness or numbness. She also has a mild headache. She's not sure if she hit her head. She denies any loss of consciousness. She hasn't tried any medicines yet.  Additionally patient notes persistent bothersome left knee Baker's cyst. This is unrelated to her current injury. She's had this cyst aspirated several times which helps typically. She notes over the last several months worsening symptoms especially interfering with knee flexion. She denies any weakness or numbness into her knee.   Past Medical History  Diagnosis Date  . Varicose veins   . PONV (postoperative nausea and vomiting) 02-23-12    always has PONV  . Post-nasal drainage 02-23-12    frequent a problem,causes a cough and mucus buildup in throat  . ADD (attention deficit disorder) 02-23-12    easily distracted.  . Venous insufficiency     chronic-denies any problems  . Arthritis 02-23-12    Rt. hip and lt. arm  . Abnormal Pap smear, atypical squamous cells of undetermined sign (ASC-US) 1998    treated with cryo, CIN I  . Urethral polyp 1998    Dr. Hartley Barefoot  . Kidney stone    Past Surgical History  Procedure Laterality Date  . 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 Hand surgery  02-23-12    "blood clot" excised palm left hand 01-18-12  . Excision/release bursa hip  02/29/12  . Breast surgery  02-23-12    Rt. needle biopsy, recent -negative  path showed fibroadenoma  . Colonoscopy  08/2003    repeat 5 yrs.  . Colonoscopy w/ biopsies  12/2008    1 polyp recheck 5 yrs  . Knee arthroscopy w/ meniscal repair  10/2012  . Blood clot removed  Left 01/2012    blood clot removed from left hand   . Knee arthroscopy with excision baker's cyst Left 10/2012    meniscal repair  . Shoulder surgery Right 02/2015    Dr. Justice Britain - [facility]   Social History  Substance Use Topics  . Smoking status: Never Smoker   . Smokeless tobacco: Never Used  . Alcohol Use: 0.6 oz/week    1 Glasses of wine per week     Comment: rare occ.   family history includes Brain cancer (age of onset: 65) in her father; Breast cancer (age of onset: 35) in her mother; Drug abuse in her brother; Hyperlipidemia in her mother; Osteoporosis in her maternal grandmother.  ROS as above Medications: Current Outpatient Prescriptions  Medication Sig Dispense Refill  . fish oil-omega-3 fatty acids 1000 MG capsule Take 4 g by mouth daily.     Marland Kitchen FLUoxetine (PROZAC) 20 MG capsule Take 1 capsule (20 mg total) by mouth daily. 30 capsule 11  . lisdexamfetamine (VYVANSE) 40 MG capsule Take 1 capsule (40 mg total) by mouth daily. Ok to fill 11/26/15 30 capsule 0  . meloxicam (MOBIC) 15 MG tablet Take 1 tablet (15 mg total) by mouth daily as needed for pain. 30 tablet 6  . Multiple Vitamin (MULTIVITAMIN) tablet Take 1 tablet by mouth daily.     Marland Kitchen OVER THE COUNTER MEDICATION Take 2 tablets  by mouth daily with breakfast. Vitamin D gummie    . metaxalone (SKELAXIN) 800 MG tablet Take 1 tablet (800 mg total) by mouth 3 (three) times daily as needed for muscle spasms. (Patient not taking: Reported on 12/16/2015) 60 tablet 0   No current facility-administered medications for this visit.   Allergies  Allergen Reactions  . Codeine Other (See Comments)    REACTION: nausea and dizzy  . Doxycycline Nausea Only  . Concerta [Methylphenidate] Hives and Rash  . Sulfa Antibiotics Nausea Only     Exam:  BP 126/82 mmHg  Pulse 103  Wt 149 lb (67.586 kg)  LMP 12/18/2013 Gen: Well NAD HEENT: EOMI,  MMM Lungs: Normal work of breathing. CTABL Heart: RRR no MRG Abd: NABS, Soft.  Nondistended, Nontender Exts: Brisk capillary refill, warm and well perfused.  Back: Nontender to spinal midline. Tender to palpation right and left SI joints. Normal back motion. Lower extremity strength is equal and normal throughout. Reflexes equal bilateral lower extremities. Normal gait. Left knee mildly swollen and full at the Baker cyst area. Otherwise knee is unremarkable. Stable ligamentous exam.   Procedure: Real-time Ultrasound Guided aspiration and injection of left Baker's cyst  Device: GE Logiq E  Images permanently stored and available for review in the ultrasound unit. Verbal informed consent obtained. Discussed risks and benefits of procedure. Warned about infection bleeding damage to structures skin hypopigmentation and fat atrophy among others. Patient expresses understanding and agreement Time-out conducted.  Noted no overlying erythema, induration, or other signs of local infection.  Skin prepped in a sterile fashion.  Ethyl chloride applied and a small wheal of lidocaine near the planned injection site injected to achieve good anesthesia  With sterile technique and under real time ultrasound guidance and 18-gauge needle was used to access the Baker cyst with care use to avoid the neurovascular structures. 5 mL of straw-colored joint fluid aspirated. The syringe was exchanged and 80 mg of Kenalog and 4 mL of Marcaine injected easily.  Completed without difficulty  Pain immediately resolved suggesting accurate placement of the medication.  Advised to call if fevers/chills, erythema, induration, drainage, or persistent bleeding.  Images permanently stored and available for review in the ultrasound unit.  Impression: Technically successful ultrasound guided injection.  Ultrasound guidance needed to avoid neurovascular structures in the posterior knee.  No results found for this or any previous visit (from the past 24 hour(s)). Dg Lumbar Spine  Complete  12/16/2015  CLINICAL DATA:  Fall.  Low back pain. EXAM: LUMBAR SPINE - COMPLETE 4+ VIEW COMPARISON:  MRI 12/18/2013.  Lumbar spine 12/06/2013. FINDINGS: Lumbar spine numbered as per prior MRI. Lumbar spine scoliosis concave right. Diffuse degenerative change present. 5 mm retrolisthesis L2 on L3 again noted. Minimal T11 compression. Age undetermined. No acute abnormality otherwise noted . IMPRESSION: 1. Lumbar spine scoliosis diffuse multilevel prominent degenerative change again noted. Stable retrolisthesis L2 on L3. 2.  Minimal T11 compression, age undetermined.  New from 2015. Electronically Signed   By: [place]   On: 12/16/2015 12:16     Please see individual assessment and plan sections.

## 2015-12-16 NOTE — Assessment & Plan Note (Signed)
Aspiration and injection today. Recheck in a few weeks.

## 2015-12-16 NOTE — Patient Instructions (Signed)
Thank you for coming in today. Get xray today.  Attend PT.  Come back or go to the emergency room if you notice new weakness new numbness problems walking or bowel or bladder problems. Call or go to the ER if you develop a large red swollen joint with extreme pain or oozing puss.  Return in 2-4 weeks.

## 2015-12-21 NOTE — Progress Notes (Signed)
Quick Note:  Xray shows arthritis. You probably had a compression fracture of the upper part of the low back some time ago. This is not in a location where you have pain now. Continue current plan. Return sooner if needed. ______

## 2015-12-23 ENCOUNTER — Ambulatory Visit: Payer: BC Managed Care – PPO | Admitting: Rehabilitative and Restorative Service Providers"

## 2015-12-31 ENCOUNTER — Ambulatory Visit: Payer: BC Managed Care – PPO | Admitting: Family Medicine

## 2016-01-05 ENCOUNTER — Ambulatory Visit (INDEPENDENT_AMBULATORY_CARE_PROVIDER_SITE_OTHER): Payer: BC Managed Care – PPO | Admitting: Family Medicine

## 2016-01-05 ENCOUNTER — Encounter: Payer: Self-pay | Admitting: Family Medicine

## 2016-01-05 VITALS — BP 117/68 | HR 83 | Wt 143.0 lb

## 2016-01-05 DIAGNOSIS — F988 Other specified behavioral and emotional disorders with onset usually occurring in childhood and adolescence: Secondary | ICD-10-CM

## 2016-01-05 DIAGNOSIS — S39012A Strain of muscle, fascia and tendon of lower back, initial encounter: Secondary | ICD-10-CM

## 2016-01-05 DIAGNOSIS — F909 Attention-deficit hyperactivity disorder, unspecified type: Secondary | ICD-10-CM

## 2016-01-05 DIAGNOSIS — R296 Repeated falls: Secondary | ICD-10-CM

## 2016-01-05 MED ORDER — LISDEXAMFETAMINE DIMESYLATE 40 MG PO CAPS: 40.0000 mg | ORAL_CAPSULE | Freq: Every day | ORAL | Status: DC

## 2016-01-05 MED ORDER — TIZANIDINE HCL 4 MG PO CAPS: 4.0000 mg | ORAL_CAPSULE | Freq: Every evening | ORAL | Status: DC | PRN

## 2016-01-05 NOTE — Progress Notes (Signed)
   Subjective:    Patient ID: Wanda Gonzales, female    DOB: 05-23-1956, 60 y.o.   MRN: [id number]  HPI F/U ADD- He medication well without any side effects. No chest pain, palpitations. She has lost 6 pounds since I last saw her.  She also wanted to let me know that she did fall about a month ago and injured her low back her right hip in the side of her face. She saw Dr. Georgina Snell for this. They did do x-rays to rule out a fracture and she was gradually getting better until about a week ago she actually fell and tripped again at work and has reinjured her back. Her hip is fine. She knows that he had initially recommended physical therapy but was worried about potential expense. She was there any exercises that she can do at home to help. She has been taking an anti-inflammatory at home which does seem to help. But the longer she is up during the day the more sort becomes.  She has had 3 falls in a fairly short period of time. She denies any dizziness or vertigo. She says sometimes he just clumsy or rushes and then falls.  Review of Systems     Objective:   Physical Exam  Constitutional: She is oriented to person, place, and time. She appears well-developed and well-nourished.  HENT:  Head: Normocephalic and atraumatic.  Cardiovascular: Normal rate, regular rhythm and normal heart sounds.   Pulmonary/Chest: Effort normal and breath sounds normal.  Musculoskeletal:  Normal lumbar flexion, extension, rotation right and left. Hip, knee, ankle strength is 5 out of 5 bilaterally. Negative straight leg raise that she did have some discomfort in the lumbar spine with straight leg raise on the right. Patellar reflexes 0+ bilaterally.  Neurological: She is alert and oriented to person, place, and time.  Skin: Skin is warm and dry.  Psychiatric: She has a normal mood and affect. Her behavior is normal.          Assessment & Plan:  ADD- Well controlled on current regimen. No side effects from a  stimulant medication. Refills provided for the next 3 months. Follow-up in 3-4 months.  Low back strain, bilateral-recommend home stretches. Handout provided. Okay to try a low-dose muscle relaxer at bedtime. One about potential for sedation. Continue with Aleve or ibuprofen anti-inflammatory for at least the next week. If she's not improving over 2 week time then recommend formal physical therapy and encouraged her to call me back. Next  Frequent falls-recommend that she check on tai chi for balance. Did encourage her to remove rugs from places where they can cause tripping or falls such as in her home. And try to make her work environment is safe as possible.

## 2016-01-05 NOTE — Patient Instructions (Signed)
Recommend getting one of the videos were DVDs for tai chi for balance. This can be found on [employer].

## 2016-01-06 ENCOUNTER — Other Ambulatory Visit: Payer: Self-pay | Admitting: Family Medicine

## 2016-01-06 DIAGNOSIS — E785 Hyperlipidemia, unspecified: Secondary | ICD-10-CM

## 2016-01-06 DIAGNOSIS — Z114 Encounter for screening for human immunodeficiency virus [HIV]: Secondary | ICD-10-CM

## 2016-01-06 DIAGNOSIS — Z1159 Encounter for screening for other viral diseases: Secondary | ICD-10-CM

## 2016-01-06 LAB — COMPLETE METABOLIC PANEL WITH GFR
ALT: 10 U/L (ref 6–29)
AST: 16 U/L (ref 10–35)
Albumin: 4 g/dL (ref 3.6–5.1)
Alkaline Phosphatase: 103 U/L (ref 33–130)
BUN: 15 mg/dL (ref 7–25)
CO2: 28 mmol/L (ref 20–31)
Calcium: 9.4 mg/dL (ref 8.6–10.4)
Chloride: 103 mmol/L (ref 98–110)
Creat: 0.92 mg/dL (ref 0.50–1.05)
GFR, Est African American: 79 mL/min (ref >=60)
GFR, Est Non African American: 68 mL/min (ref >=60)
Glucose, Bld: 84 mg/dL (ref 65–99)
Potassium: 4.4 mmol/L (ref 3.5–5.3)
Sodium: 140 mmol/L (ref 135–146)
Total Bilirubin: 0.7 mg/dL (ref 0.2–1.2)
Total Protein: 6.9 g/dL (ref 6.1–8.1)

## 2016-01-06 LAB — LIPID PANEL
Cholesterol: 237 mg/dL — ABNORMAL HIGH (ref 125–200)
HDL: 62 mg/dL (ref >=46)
LDL Cholesterol: 158 mg/dL — ABNORMAL HIGH (ref <130)
Total CHOL/HDL Ratio: 3.8 Ratio (ref <=5.0)
Triglycerides: 85 mg/dL (ref <150)
VLDL: 17 mg/dL (ref <30)

## 2016-01-07 LAB — HEPATITIS C ANTIBODY: HCV Ab: NEGATIVE

## 2016-01-07 LAB — HIV ANTIBODY (ROUTINE TESTING W REFLEX): HIV 1&2 Ab, 4th Generation: NONREACTIVE

## 2016-02-08 NOTE — Telephone Encounter (Signed)
Note opened in error.

## 2016-02-17 ENCOUNTER — Encounter: Payer: Self-pay | Admitting: Family Medicine

## 2016-02-17 ENCOUNTER — Ambulatory Visit (INDEPENDENT_AMBULATORY_CARE_PROVIDER_SITE_OTHER): Payer: BC Managed Care – PPO | Admitting: Family Medicine

## 2016-02-17 ENCOUNTER — Telehealth: Payer: Self-pay | Admitting: Obstetrics and Gynecology

## 2016-02-17 VITALS — BP 110/64 | HR 76 | Temp 97.3°F

## 2016-02-17 DIAGNOSIS — R300 Dysuria: Secondary | ICD-10-CM | POA: Diagnosis not present

## 2016-02-17 LAB — POCT URINALYSIS DIPSTICK
Bilirubin, UA: NEGATIVE
Glucose, UA: NEGATIVE
Nitrite, UA: NEGATIVE
Protein, UA: NEGATIVE
Spec Grav, UA: >=1.03
Urobilinogen, UA: 1
pH, UA: 6

## 2016-02-17 MED ORDER — CIPROFLOXACIN HCL 250 MG PO TABS: ORAL_TABLET | ORAL | Status: AC

## 2016-02-17 NOTE — Telephone Encounter (Signed)
Patient called and left a message at lunch complaining of "painful urination." I called her back to offer her an appointment but she declined due to going out of town this afternoon. She has a flight to catch at 4:00 PM. Patient requests to speak with the nurse.  Pharmacy on file is correct, if needed.

## 2016-02-17 NOTE — Progress Notes (Signed)
Urinalysis suspicious for UTI therefore start Cipro pending culture results.

## 2016-02-17 NOTE — Telephone Encounter (Signed)
Return call to patient.  She was seen at PCP and UA collected and culture sent.  Patient was prescribed Cipro and she is picking it up now.  Advised patient to ensure she is hydrating well while traveling and to go to an urgent care out of town if pain worsens or develops fevers or nausea or vomiting.  Wished well with travels.   Will close encounter.

## 2016-02-17 NOTE — Progress Notes (Signed)
Patient was added onto the nurse visit schedule today for dysuria and increased urine frequency x1 day. Pt reports she is leaving this afternoon for [place]. Urine will be sent for urine culture per Physician request. Rx will be printed so Pt can take to her pharmacy for fill prior to leaving town. No further questions/concerns.

## 2016-02-19 LAB — URINE CULTURE
Colony Count: NO GROWTH
Organism ID, Bacteria: NO GROWTH

## 2016-03-31 ENCOUNTER — Ambulatory Visit (INDEPENDENT_AMBULATORY_CARE_PROVIDER_SITE_OTHER): Payer: BC Managed Care – PPO | Admitting: Family Medicine

## 2016-03-31 ENCOUNTER — Encounter: Payer: Self-pay | Admitting: Family Medicine

## 2016-03-31 VITALS — BP 103/67 | HR 101 | Temp 97.8°F | Wt 145.0 lb

## 2016-03-31 DIAGNOSIS — R300 Dysuria: Secondary | ICD-10-CM | POA: Diagnosis not present

## 2016-03-31 LAB — POCT URINALYSIS DIPSTICK
Bilirubin, UA: NEGATIVE
Glucose, UA: NEGATIVE
Ketones, UA: NEGATIVE
Nitrite, UA: NEGATIVE
Protein, UA: NEGATIVE
Spec Grav, UA: >=1.03
Urobilinogen, UA: 0.2
pH, UA: 6

## 2016-03-31 MED ORDER — CEPHALEXIN 500 MG PO CAPS: 500.0000 mg | ORAL_CAPSULE | Freq: Two times a day (BID) | ORAL | Status: DC

## 2016-03-31 NOTE — Patient Instructions (Signed)
Thank you for coming in today. Return if not better.  Take keflex.   Urinary Tract Infection Urinary tract infections (UTIs) can develop anywhere along your urinary tract. Your urinary tract is your body's drainage system for removing wastes and extra water. Your urinary tract includes two kidneys, two ureters, a bladder, and a urethra. Your kidneys are a pair of bean-shaped organs. Each kidney is about the size of your fist. They are located below your ribs, one on each side of your spine. CAUSES Infections are caused by microbes, which are microscopic organisms, including fungi, viruses, and bacteria. These organisms are so small that they can only be seen through a microscope. Bacteria are the microbes that most commonly cause UTIs. SYMPTOMS  Symptoms of UTIs may vary by age and gender of the patient and by the location of the infection. Symptoms in young women typically include a frequent and intense urge to urinate and a painful, burning feeling in the bladder or urethra during urination. Older women and men are more likely to be tired, shaky, and weak and have muscle aches and abdominal pain. A fever may mean the infection is in your kidneys. Other symptoms of a kidney infection include pain in your back or sides below the ribs, nausea, and vomiting. DIAGNOSIS To diagnose a UTI, your caregiver will ask you about your symptoms. Your caregiver will also ask you to provide a urine sample. The urine sample will be tested for bacteria and white blood cells. White blood cells are made by your body to help fight infection. TREATMENT  Typically, UTIs can be treated with medication. Because most UTIs are caused by a bacterial infection, they usually can be treated with the use of antibiotics. The choice of antibiotic and length of treatment depend on your symptoms and the type of bacteria causing your infection. HOME CARE INSTRUCTIONS  If you were prescribed antibiotics, take them exactly as your  caregiver instructs you. Finish the medication even if you feel better after you have only taken some of the medication.  Drink enough water and fluids to keep your urine clear or pale yellow.  Avoid caffeine, tea, and carbonated beverages. They tend to irritate your bladder.  Empty your bladder often. Avoid holding urine for long periods of time.  Empty your bladder before and after sexual intercourse.  After a bowel movement, women should cleanse from front to back. Use each tissue only once. SEEK MEDICAL CARE IF:   You have back pain.  You develop a fever.  Your symptoms do not begin to resolve within 3 days. SEEK IMMEDIATE MEDICAL CARE IF:   You have severe back pain or lower abdominal pain.  You develop chills.  You have nausea or vomiting.  You have continued burning or discomfort with urination. MAKE SURE YOU:   Understand these instructions.  Will watch your condition.  Will get help right away if you are not doing well or get worse.   This information is not intended to replace advice given to you by your health care provider. Make sure you discuss any questions you have with your health care provider.   Document Released: 06/15/2005 Document Revised: 05/27/2015 Document Reviewed: 10/14/2011 Elsevier Interactive Patient Education [employer].

## 2016-03-31 NOTE — Addendum Note (Signed)
Addended by: Darla Lesches T on: 03/31/2016 02:07 PM   Modules accepted: Orders

## 2016-03-31 NOTE — Progress Notes (Signed)
Wanda Gonzales is a 60 y.o. female who presents to [facility] today for vaginal burning.  Patient reports her pain began last night around midnight.  The pain woke her up from sleep and she took a left over pill of Cipro from her previous UTI, 2 months ago.  Patient states that was her "first UTI in years".  She reports some mild improvement but continues to complain of pain currently.  She denies vaginal discharge, itching, bleeding, and dysuria.  Also denies abdominal pain, nausea, vomiting, and fever.  She claims the pain feels similar to her previous UTI.  Patient is menopausal and is sexually active with her husband.  No other complaints.     Past Medical History  Diagnosis Date  . Varicose veins   . PONV (postoperative nausea and vomiting) 02-23-12    always has PONV  . Post-nasal drainage 02-23-12    frequent a problem,causes a cough and mucus buildup in throat  . ADD (attention deficit disorder) 02-23-12    easily distracted.  . Venous insufficiency     chronic-denies any problems  . Arthritis 02-23-12    Rt. hip and lt. arm  . Abnormal Pap smear, atypical squamous cells of undetermined sign (ASC-US) 1998    treated with cryo, CIN I  . Urethral polyp 1998    Dr. Hartley Barefoot  . Kidney stone    Past Surgical History  Procedure Laterality Date  . 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 Hand surgery  02-23-12    "blood clot" excised palm left hand 01-18-12  . Excision/release bursa hip  02/29/12  . Breast surgery  02-23-12    Rt. needle biopsy, recent -negative  path showed fibroadenoma  . Colonoscopy  08/2003    repeat 5 yrs.  . Colonoscopy w/ biopsies  12/2008    1 polyp recheck 5 yrs  . Knee arthroscopy w/ meniscal repair  10/2012  . Blood clot removed Left 01/2012    blood clot removed from left hand   . Knee  arthroscopy with excision baker's cyst Left 10/2012    meniscal repair  . Shoulder surgery Right 02/2015    Dr. Justice Britain - [facility]   Social History  Substance Use Topics  . Smoking status: Never Smoker   . Smokeless tobacco: Never Used  . Alcohol Use: 0.6 oz/week    1 Glasses of wine per week     Comment: rare occ.   family history includes Brain cancer (age of onset: 32) in her father; Breast cancer (age of onset: 3) in her mother; Drug abuse in her brother; Hyperlipidemia in her mother; Osteoporosis in her maternal grandmother.  ROS as above  Medications: Current Outpatient Prescriptions  Medication Sig Dispense Refill  . fish oil-omega-3 fatty acids 1000 MG capsule Take 4 g by mouth daily.     Marland Kitchen FLUoxetine (PROZAC) 20 MG capsule Take 1 capsule (20 mg total) by mouth daily. 30 capsule 11  . lisdexamfetamine (VYVANSE) 40 MG capsule Take 1 capsule (40 mg total) by mouth daily. Ok to fill 03/27/16 30 capsule 0  . meloxicam (MOBIC) 15 MG tablet Take 1 tablet (15 mg total) by mouth daily as needed for pain. 30 tablet 6  . Multiple Vitamin (MULTIVITAMIN) tablet Take 1 tablet by mouth daily.     Marland Kitchen OVER THE COUNTER MEDICATION Take 2 tablets by  mouth daily with breakfast. Vitamin D gummie    . cephALEXin (KEFLEX) 500 MG capsule Take 1 capsule (500 mg total) by mouth 2 (two) times daily. 14 capsule 0   No current facility-administered medications for this visit.   Allergies  Allergen Reactions  . Codeine Other (See Comments)    REACTION: nausea and dizzy  . Doxycycline Nausea Only  . Concerta [Methylphenidate] Hives and Rash  . Sulfa Antibiotics Nausea Only     Exam:  BP 103/67 mmHg  Pulse 101  Temp(Src) 97.8 F (36.6 C) (Oral)  Wt 145 lb (65.772 kg)  SpO2 97%  LMP 12/18/2013 Gen: Well, NAD Lungs: Normal work of breathing. CTABL Heart: RRR no MRG Abd: NABS, Soft. Nondistended, Nontender No CVA angle tenderness to percussion Exts: Warm and well perfused.  Patient  deferred vaginal exam   Urinalysis significant for lysed red blood cells and leukocytes.  There was insufficient volume for urine culture.    Assessment and Plan: 60 y.o. female with vaginal burning, concerning for UTI given this feels similar to her previous UTIs.  Lack of vaginal discharge or itching makes vaginitis less likely. - Keflex 500 mg BID for 7 days - follow up if not improved by then for further evaluation.    Discussed warning signs or symptoms. Please see discharge instructions. Patient expresses understanding.

## 2016-04-08 ENCOUNTER — Other Ambulatory Visit: Payer: Self-pay | Admitting: Nurse Practitioner

## 2016-04-08 DIAGNOSIS — Z139 Encounter for screening, unspecified: Secondary | ICD-10-CM

## 2016-04-20 ENCOUNTER — Ambulatory Visit (INDEPENDENT_AMBULATORY_CARE_PROVIDER_SITE_OTHER): Payer: BC Managed Care – PPO

## 2016-04-20 DIAGNOSIS — Z1231 Encounter for screening mammogram for malignant neoplasm of breast: Secondary | ICD-10-CM | POA: Diagnosis not present

## 2016-04-20 DIAGNOSIS — Z139 Encounter for screening, unspecified: Secondary | ICD-10-CM

## 2016-04-26 ENCOUNTER — Other Ambulatory Visit: Payer: Self-pay | Admitting: Sports Medicine

## 2016-04-26 DIAGNOSIS — M25511 Pain in right shoulder: Secondary | ICD-10-CM

## 2016-05-06 ENCOUNTER — Encounter: Payer: Self-pay | Admitting: Family Medicine

## 2016-05-06 ENCOUNTER — Ambulatory Visit (INDEPENDENT_AMBULATORY_CARE_PROVIDER_SITE_OTHER): Payer: BC Managed Care – PPO | Admitting: Family Medicine

## 2016-05-06 VITALS — BP 98/57 | HR 106 | Resp 16 | Wt 146.0 lb

## 2016-05-06 DIAGNOSIS — E785 Hyperlipidemia, unspecified: Secondary | ICD-10-CM | POA: Diagnosis not present

## 2016-05-06 DIAGNOSIS — N951 Menopausal and female climacteric states: Secondary | ICD-10-CM | POA: Diagnosis not present

## 2016-05-06 DIAGNOSIS — F909 Attention-deficit hyperactivity disorder, unspecified type: Secondary | ICD-10-CM | POA: Diagnosis not present

## 2016-05-06 DIAGNOSIS — F988 Other specified behavioral and emotional disorders with onset usually occurring in childhood and adolescence: Secondary | ICD-10-CM

## 2016-05-06 MED ORDER — LISDEXAMFETAMINE DIMESYLATE 40 MG PO CAPS: 40.0000 mg | ORAL_CAPSULE | Freq: Every day | ORAL | 0 refills | Status: DC

## 2016-05-06 NOTE — Progress Notes (Addendum)
Subjective:    CC: ADD  HPI:  F/U ADD- He medication well without any side effects. No chest pain, palpitations.  She is having a lot of problems with hot flashes and feeling overheated. She quit having her period almost 3 years ago. She's not been sleeping well because of it. She did try some over-the-counter things like estrogen but didn't like how she felt on it. She has an appointment with her OB/GYN in a couple weeks and will discuss it with them.  Past medical history, Surgical history, Family history not pertinant except as noted below, Social history, Allergies, and medications have been entered into the medical record, reviewed, and corrections made.   Review of Systems: No fevers, chills, night sweats, weight loss, chest pain, or shortness of breath.   Objective:    General: Well Developed, well nourished, and in no acute distress.  Neuro: Alert and oriented x3, extra-ocular muscles intact, sensation grossly intact.  HEENT: Normocephalic, atraumatic  Skin: Warm and dry, no rashes. Cardiac: Regular rate and rhythm, no murmurs rubs or gallops, no lower extremity edema.  Respiratory: Clear to auscultation bilaterally. Not using accessory muscles, speaking in full sentences.   Impression and Recommendations:    ADD- Well controlled. Continue current regimen. Follow up in  4 months.    Post menopausal/high flashes-we'll discuss with OB/GYN in a couple of weeks. If they decide to do HRT and she is still having some sleep issues I'm happy to see her back to discuss options and treatment.  She did want to review her cholesterol today. She went for labs about 4 months ago and her cholesterol did jump up from previous. Encouraged her to get on track with diet and exercise to bring this back to where it was about 3 years ago.  She reports that her flu shot is up-to-date.

## 2016-05-19 ENCOUNTER — Ambulatory Visit: Payer: BC Managed Care – PPO | Admitting: Obstetrics and Gynecology

## 2016-05-19 ENCOUNTER — Encounter: Payer: Self-pay | Admitting: Obstetrics and Gynecology

## 2016-05-19 VITALS — BP 108/60 | HR 82 | Resp 16 | Ht 60.5 in | Wt 156.0 lb

## 2016-05-19 DIAGNOSIS — E559 Vitamin D deficiency, unspecified: Secondary | ICD-10-CM

## 2016-05-19 DIAGNOSIS — M8580 Other specified disorders of bone density and structure, unspecified site: Secondary | ICD-10-CM

## 2016-05-19 DIAGNOSIS — Z01419 Encounter for gynecological examination (general) (routine) without abnormal findings: Secondary | ICD-10-CM

## 2016-05-19 NOTE — Patient Instructions (Signed)

## 2016-05-19 NOTE — Progress Notes (Signed)
60 y.o. G0P0 MarriedCaucasianF here for annual exam.   Having trouble sleeping. Falls asleep okay, wakes up 3-4 x a week and has trouble falling asleep.  She does take meds intermittently for ADD, better when she doesn't take it.  She has been falling a lot in the last year. No major injuries. She spoke to her primary about it. Told her to work on her balance. She thinks she is distracted.  No vaginal bleeding. She has had some vaginal burning. Her primary treated her with antibiotics for a UTI. She had another UTI 1 month later. No increased sexually active around the UTI's. She is sexually active, no pain. Would prefer not to discuss.     Patient's last menstrual period was 12/18/2013.          Sexually active: Yes.    The current method of family planning is post menopausal status.    Exercising: No.  The patient does not participate in regular exercise at present. Smoker:  no  Health Maintenance: Pap:  05/14/15 Neg. HR HPV:Neg  History of abnormal Pap:  Yes, ASCUS, CIN I, several years ago. 1998 MMG:  04/21/16 BIRADS1:neg  Colonoscopy:  10/21/14 f/u 5 years  BMD:   01/08/15 Osteopenia  TDaP:  01/24/09    reports that she has never smoked. She has never used smokeless tobacco. She reports that she drinks about 0.6 oz of alcohol per week . She reports that she does not use drugs.  Past Medical History:  Diagnosis Date  . Abnormal Pap smear, atypical squamous cells of undetermined sign (ASC-US) 1998   treated with cryo, CIN I  . ADD (attention deficit disorder) 02-23-12   easily distracted.  . Arthritis 02-23-12   Rt. hip and lt. arm  . Kidney stone   . PONV (postoperative nausea and vomiting) 02-23-12   always has PONV  . Post-nasal drainage 02-23-12   frequent a problem,causes a cough and mucus buildup in throat  . Urethral polyp 1998   Dr. Hartley Barefoot  . Varicose veins   . Venous insufficiency    chronic-denies any problems    Past Surgical History:  Procedure Laterality Date  . blood  clot removed Left 01/2012   blood clot removed from left hand   . BREAST SURGERY  02-23-12   Rt. needle biopsy, recent -negative  path showed fibroadenoma  . COLONOSCOPY  08/2003   repeat 5 yrs.  . COLONOSCOPY W/ BIOPSIES  12/2008   1 polyp recheck 5 yrs  . EXCISION/RELEASE BURSA HIP  02/29/12  . HAND SURGERY  02-23-12   "blood clot" excised palm left hand 01-18-12  . KNEE ARTHROSCOPY W/ MENISCAL REPAIR  10/2012  . KNEE ARTHROSCOPY WITH EXCISION BAKER'S CYST Left 10/2012   meniscal repair  . SHOULDER SURGERY Right 02/2015   Dr. Lennette Bihari Supple - [facility]  . 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 fish oil-omega-3 fatty acids 1000 MG capsule Take 4 g by mouth daily.     Marland Kitchen FLUoxetine (PROZAC) 20 MG capsule Take 1 capsule (20 mg total) by mouth daily. 30 capsule 11  . lisdexamfetamine (VYVANSE) 40 MG capsule Take 1 capsule (40 mg total) by mouth daily. Ok to fill 07/06/16 30 capsule 0  . meloxicam (MOBIC) 15 MG tablet TAKE 1 TABLET (15 MG TOTAL) BY MOUTH DAILY AS NEEDED FOR PAIN. 30 tablet 4  .  Multiple Vitamin (MULTIVITAMIN) tablet Take 1 tablet by mouth daily.     Marland Kitchen OVER THE COUNTER MEDICATION Take 2 tablets by mouth daily with breakfast. Vitamin D gummie     No current facility-administered medications for this visit.     Family History  Problem Relation Age of Onset  . Breast cancer Mother 41    breast  . Hyperlipidemia Mother   . Brain cancer Father 62    brain  . Osteoporosis Maternal Grandmother   . Drug abuse Brother     Review of Systems  Constitutional: Positive for unexpected weight change.  HENT: Negative.   Eyes: Negative.   Respiratory: Negative.   Cardiovascular: Negative.   Gastrointestinal: Negative.   Endocrine: Negative.   Genitourinary: Positive for dysuria.  Musculoskeletal: Negative.   Skin: Negative.   Allergic/Immunologic: Negative.    Neurological: Negative.   Hematological: Negative.   Psychiatric/Behavioral: Positive for sleep disturbance.    Exam:   BP 108/60 (BP Location: Right Arm, Patient Position: Sitting, Cuff Size: Normal)   Pulse 82   Resp 16   Ht 5' 0.5" (1.537 m)   Wt 156 lb (70.8 kg)   LMP 12/18/2013   BMI 29.97 kg/m   Weight change: @WEIGHTCHANGE @ Height:   Height: 5' 0.5" (153.7 cm)  Ht Readings from Last 3 Encounters:  05/19/16 5' 0.5" (1.537 m)  06/01/15 5\' 1"  (1.549 m)  05/14/15 5' 1.25" (1.556 m)    General appearance: alert, cooperative and appears stated age Head: Normocephalic, without obvious abnormality, atraumatic Neck: no adenopathy, supple, symmetrical, trachea midline and thyroid normal to inspection and palpation Lungs: clear to auscultation bilaterally Breasts: normal appearance, no masses or tenderness Heart: regular rate and rhythm Abdomen: soft, non-tender; bowel sounds normal; no masses,  no organomegaly Extremities: extremities normal, atraumatic, no cyanosis or edema Skin: Skin color, texture, turgor normal. No rashes or lesions Lymph nodes: Cervical, supraclavicular, and axillary nodes normal. No abnormal inguinal nodes palpated Neurologic: Grossly normal   Pelvic: External genitalia:  no lesions              Urethra:  normal appearing urethra with no masses, tenderness or lesions              Bartholins and Skenes: normal                 Vagina: normal appearing atrophic vagina with normal color and discharge, no lesions              Cervix: no lesions               Bimanual Exam:  Uterus:  normal size, contour, position, consistency, mobility, non-tender              Adnexa: no mass, fullness, tenderness               Rectovaginal: Confirms               Anus:  normal sphincter tone, no lesions  Chaperone was present for exam.  A:  Well Woman with normal exam  H/O vit D def  Osteopenia  P:   Labs and immunizations with primary  Mammogram and colonoscopy  are UTD  DEXA in 4/18 with her primary  Discussed breast self exam  Discussed calcium and vit D intake   Addendum: she pointed out a bump on her forehead, feels like a bump on her skull, no skin changes, recommended she f/u with her primary.

## 2016-05-20 LAB — VITAMIN D 25 HYDROXY (VIT D DEFICIENCY, FRACTURES): Vit D, 25-Hydroxy: 30 ng/mL (ref 30–100)

## 2016-06-02 ENCOUNTER — Other Ambulatory Visit: Payer: Self-pay

## 2016-06-02 MED ORDER — FLUOXETINE HCL 20 MG PO CAPS: 20.0000 mg | ORAL_CAPSULE | Freq: Every day | ORAL | 0 refills | Status: DC

## 2016-06-10 ENCOUNTER — Other Ambulatory Visit: Payer: Self-pay

## 2016-06-10 DIAGNOSIS — M25511 Pain in right shoulder: Secondary | ICD-10-CM

## 2016-06-10 MED ORDER — MELOXICAM 15 MG PO TABS: 15.0000 mg | ORAL_TABLET | Freq: Every day | ORAL | 0 refills | Status: DC | PRN

## 2016-08-25 ENCOUNTER — Ambulatory Visit (INDEPENDENT_AMBULATORY_CARE_PROVIDER_SITE_OTHER): Payer: BC Managed Care – PPO | Admitting: Sports Medicine

## 2016-08-25 ENCOUNTER — Ambulatory Visit (INDEPENDENT_AMBULATORY_CARE_PROVIDER_SITE_OTHER): Payer: BC Managed Care – PPO

## 2016-08-25 ENCOUNTER — Encounter: Payer: Self-pay | Admitting: Sports Medicine

## 2016-08-25 VITALS — BP 94/62 | HR 86 | Ht 61.0 in | Wt 146.0 lb

## 2016-08-25 DIAGNOSIS — H8110 Benign paroxysmal vertigo, unspecified ear: Secondary | ICD-10-CM | POA: Insufficient documentation

## 2016-08-25 DIAGNOSIS — M7122 Synovial cyst of popliteal space [Baker], left knee: Secondary | ICD-10-CM | POA: Diagnosis not present

## 2016-08-25 DIAGNOSIS — M1712 Unilateral primary osteoarthritis, left knee: Secondary | ICD-10-CM | POA: Diagnosis not present

## 2016-08-25 DIAGNOSIS — M7121 Synovial cyst of popliteal space [Baker], right knee: Secondary | ICD-10-CM

## 2016-08-25 DIAGNOSIS — Z96652 Presence of left artificial knee joint: Secondary | ICD-10-CM | POA: Insufficient documentation

## 2016-08-25 MED ORDER — MECLIZINE HCL 25 MG PO TABS: 25.0000 mg | ORAL_TABLET | Freq: Three times a day (TID) | ORAL | 3 refills | Status: DC | PRN

## 2016-08-25 NOTE — Progress Notes (Signed)
  Subjective:    I'm seeing this patient as a consultation for:  Dr. Verne Spurr  CC: left knee pain, baker's cyst  HPI: This pleasant 60 yo woman presents with left anterior knee pain. She is s/p arthroscopy for left meniscal tear approx 4 years ago. Her knee has been hurting for a few months, but is gradually worsening. She noticed a sharp pain 3 days ago while squatting. Pain was so severe she felt like she couldn't walk. Has tried Meloxicam with only mild relief. Additionally, she has a history of baker's cyst on left popliteal fossa that has been drained in the past, which she feel has recurred.  Patient reported at the end of the visit that she had an episode of dizziness while rolling over in bed this morning. States she felt "really drunk." Denies hearing changes, tinnitus, vision changes, or syncope.   Past Medical History:  Diagnosis Date  . Abnormal Pap smear, atypical squamous cells of undetermined sign (ASC-US) 1998   treated with cryo, CIN I  . ADD (attention deficit disorder) 02-23-12   easily distracted.  . Arthritis 02-23-12   Rt. hip and lt. arm  . Kidney calculi 07/2015  . Kidney stone   . PONV (postoperative nausea and vomiting) 02-23-12   always has PONV  . Post-nasal drainage 02-23-12   frequent a problem,causes a cough and mucus buildup in throat  . Urethral polyp 1998   Dr. Hartley Barefoot  . Varicose veins   . Venous insufficiency    chronic-denies any problems   Past Surgical History:  Procedure Laterality Date  . blood clot removed Left 01/2012   blood clot removed from left hand   . BREAST SURGERY  02-23-12   Rt. needle biopsy, recent -negative  path showed fibroadenoma  . COLONOSCOPY  08/2003   repeat 5 yrs.  . COLONOSCOPY W/ BIOPSIES  12/2008   1 polyp recheck 5 yrs  . EXCISION/RELEASE BURSA HIP  02/29/12  . HAND SURGERY  02-23-12   "blood clot" excised palm left hand 01-18-12  . KNEE ARTHROSCOPY W/ MENISCAL REPAIR  10/2012  . KNEE ARTHROSCOPY WITH EXCISION BAKER'S CYST  Left 10/2012   meniscal repair  . SHOULDER SURGERY Right 02/2015   Dr. Lennette Bihari Supple - [facility]  . 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 Codeine Other (See Comments)    REACTION: nausea and dizzy  . Doxycycline Nausea Only  . Concerta [Methylphenidate] Hives and Rash  . Sulfa Antibiotics Nausea Only    Review of Systems: Negative except as noted in HPI  Objective:   General: Alert, not ill-appearing and in no acute distress. Left knee: mild effusion, positive McMurray's, medial joint line tenderness. No erythema or warmth. Normal ROM. Neuro: able to move all 4 extremities, sensation grossly intact. Skin: Warm and dry, no rashes noted.  Cardiovascular: Pulses palpable, no extremity edema.  Impression and Recommendations:   This case required medical decision making of moderate complexity.  Baker's cyst Aspirated and injected. Return as needed  Primary osteoarthritis of left knee Aspirated and injected. Return as needed  Benign paroxysmal positional vertigo Prescription sent for Meclizine today. Patient declined vestibular rehab referral at this time. Instructed to follow-up with her PCP

## 2016-08-25 NOTE — Assessment & Plan Note (Signed)
Aspirated and injected. Return as needed

## 2016-08-25 NOTE — Progress Notes (Signed)
  Procedure: Real-time Ultrasound Guided aspiration/injection of left knee Device: GE Logiq E  Verbal informed consent obtained.  Time-out conducted.  Noted no overlying erythema, induration, or other signs of local infection.  Skin prepped in a sterile fashion.  Local anesthesia: Topical Ethyl chloride.  With sterile technique and under real time ultrasound guidance:  Aspirated a small amount of straw-colored fluid, syringe switched and 1 mL kenalog 40, 2 mL lidocaine, 2 mL Marcaine injected easily Completed without difficulty  Pain immediately resolved suggesting accurate placement of the medication.  Advised to call if fevers/chills, erythema, induration, drainage, or persistent bleeding.  Images permanently stored and available for review in the ultrasound unit.  Impression: Technically successful ultrasound guided injection.  Procedure: Real-time Ultrasound Guided aspiration/injection of left knee Baker's cyst Device: GE Logiq E  Verbal informed consent obtained.  Time-out conducted.  Noted no overlying erythema, induration, or other signs of local infection.  Skin prepped in a sterile fashion.  Local anesthesia: Topical Ethyl chloride.  With sterile technique and under real time ultrasound guidance:  Aspirated a scant amount of straw-colored fluid, syringe switched and 1 mL kenalog 40, 1 mL lidocaine injected easily Completed without difficulty  Pain immediately resolved suggesting accurate placement of the medication.  Advised to call if fevers/chills, erythema, induration, drainage, or persistent bleeding.  Images permanently stored and available for review in the ultrasound unit.  Impression: Technically successful ultrasound guided injection.  The knee was then strapped with compressive dressing.

## 2016-08-25 NOTE — Assessment & Plan Note (Signed)
Prescription sent for Meclizine today. Patient declined vestibular rehab referral at this time. Instructed to follow-up with her PCP

## 2016-08-25 NOTE — Patient Instructions (Signed)
Baker Cyst A Baker cyst, also called a popliteal cyst, is a sac-like growth that forms at the back of the knee. The cyst forms when the fluid-filled sac (bursa) that cushions the knee joint becomes enlarged. The bursa that becomes a Baker cyst is located at the back of the knee joint. What are the causes? In most cases, a Baker cyst results from another knee problem that causes swelling inside the knee. This makes the fluid inside the knee joint (synovial fluid) flow into the bursa behind the knee, causing the bursa to enlarge. What increases the risk? You may be more likely to develop a Baker cyst if you already have a knee problem, such as:  A tear in cartilage that cushions the knee joint (meniscal tear).  A tear in the tissues that connect the bones of the knee joint (ligament tear).  Knee swelling from osteoarthritis, rheumatoid arthritis, or gout. What are the signs or symptoms? A Baker cyst does not always cause symptoms. A lump behind the knee may be the only sign of the condition. The lump may be painful, especially when the knee is straightened. If the lump is painful, the pain may come and go. The knee may also be stiff. Symptoms may quickly get more severe if the cyst breaks open (ruptures). If your cyst ruptures, signs and symptoms may affect the knee and the back of the lower leg (calf) and may include:  Sudden or worsening pain.  Swelling.  Bruising. How is this diagnosed? This condition may be diagnosed based on your symptoms and medical history. Your health care provider will also do a physical exam. This may include:  Feeling the cyst to check whether it is tender.  Checking your knee for signs of another knee condition that causes swelling. You may have imaging tests, such as:  X-rays.  MRI.  Ultrasound. How is this treated? A Baker cyst that is not painful may go away without treatment. If the cyst gets large or painful, it will likely get better if the  underlying knee problem is treated. Treatment for a Baker cyst may include:  Resting.  Keeping weight off of the knee. This means not leaning on the knee to support your body weight.  NSAIDs to reduce pain and swelling.  A procedure to drain the fluid from the cyst with a needle (aspiration). You may also get an injection of a medicine that reduces swelling (steroid).  Surgery. This may be needed if other treatments do not work. This usually involves correcting knee damage and removing the cyst. Follow these instructions at home:  Take over-the-counter and prescription medicines only as told by your health care provider.  Rest and return to your normal activities as told by your health care provider. Avoid activities that make pain or swelling worse. Ask your health care provider what activities are safe for you.  Keep all follow-up visits as told by your health care provider. This is important. Contact a health care provider if:  You have knee pain, stiffness, or swelling that does not get better. Get help right away if:  You have sudden or worsening pain and swelling in your calf area. This information is not intended to replace advice given to you by your health care provider. Make sure you discuss any questions you have with your health care provider. Document Released: 09/05/2005 Document Revised: 05/26/2016 Document Reviewed: 05/26/2016 Elsevier Interactive Patient Education  2017 [employer].  Knee Effusion Introduction Knee effusion means that you have  excess fluid in your knee joint. This can cause pain and swelling in your knee. This may make your knee more difficult to bend and move. That is because there is increased pain and pressure in the joint. If there is fluid in your knee, it often means that something is wrong inside your knee, such as severe arthritis, abnormal inflammation, or an infection. Another common cause of knee effusion is an injury to the knee muscles,  ligaments, or cartilage. Follow these instructions at home:  Use crutches as directed by your health care provider.  Wear a knee brace as directed by your health care provider.  Apply ice to the swollen area:  Put ice in a plastic bag.  Place a towel between your skin and the bag.  Leave the ice on for 20 minutes, 2-3 times per day.  Keep your knee raised (elevated) when you are sitting or lying down.  Take medicines only as directed by your health care provider.  Do any rehabilitation or strengthening exercises as directed by your health care provider.  Rest your knee as directed by your health care provider. You may start doing your normal activities again when your health care provider approves.  Keep all follow-up visits as directed by your health care provider. This is important. Contact a health care provider if:  You have ongoing (persistent) pain in your knee. Get help right away if:  You have increased swelling or redness of your knee.  You have severe pain in your knee.  You have a fever. This information is not intended to replace advice given to you by your health care provider. Make sure you discuss any questions you have with your health care provider. Document Released: 11/26/2003 Document Revised: 02/11/2016 Document Reviewed: 04/21/2014  2017 Elsevier

## 2016-09-05 ENCOUNTER — Ambulatory Visit (INDEPENDENT_AMBULATORY_CARE_PROVIDER_SITE_OTHER): Payer: BC Managed Care – PPO | Admitting: Family Medicine

## 2016-09-05 ENCOUNTER — Encounter: Payer: Self-pay | Admitting: Family Medicine

## 2016-09-05 VITALS — BP 95/65 | HR 75 | Wt 143.0 lb

## 2016-09-05 DIAGNOSIS — F900 Attention-deficit hyperactivity disorder, predominantly inattentive type: Secondary | ICD-10-CM

## 2016-09-05 DIAGNOSIS — H8112 Benign paroxysmal vertigo, left ear: Secondary | ICD-10-CM

## 2016-09-05 MED ORDER — AMPHETAMINE-DEXTROAMPHETAMINE 20 MG PO TABS: 20.0000 mg | ORAL_TABLET | Freq: Every day | ORAL | 0 refills | Status: DC

## 2016-09-05 NOTE — Progress Notes (Signed)
Subjective:    CC: ADD  HPI:  Follow-up ADD-now that she is retired she's not taking her Vyvanse regularly. When she does take it it tends to keep her awake at night and she is noticing that is causing her trying to hurt. 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  Still having problems with dizziness. She says one morning she just woke up and suddenly felt dizzy with movement. She had mentioned it to Dr. Dianah Field who wrote a prescription for meclizine and asked her to follow-up with me for further evaluation. She's not had any recent upper respiratory infections or ear pain. She says it is getting better and is not nearly as intense.  Past medical history, Surgical history, Family history not pertinant except as noted below, Social history, Allergies, and medications have been entered into the medical record, reviewed, and corrections made.   Review of Systems: No fevers, chills, night sweats, weight loss, chest pain, or shortness of breath.   Objective:    General: Well Developed, well nourished, and in no acute distress.  Neuro: Alert and oriented x3, extra-ocular muscles intact, sensation grossly intact.  HEENT: Normocephalic, atraumatic, Oropharynx is clear, TMs and canals are clear. No significant cervical lymphadenopathy.  Skin: Warm and dry, no rashes. Cardiac: Regular rate and rhythm, no murmurs rubs or gallops, no lower extremity edema.  Respiratory: Clear to auscultation bilaterally. Not using accessory muscles, speaking in full sentences. Neuro: Positive Dix-Hallpike maneuver to the left. Nasal normal range of motion.   Impression and Recommendations:   ADD- we'll discontinue Vyvanse and switched to short-acting Adderall. She can try this and let me know how it's doing after a month. She's having any problems or teeth grinding with this one them please let me know.  BPV- even handout for exercises to do on her own. She artery has a prescription for  meclizine to use as needed. Call if not resolving in the next couple of weeks.

## 2016-09-09 ENCOUNTER — Telehealth: Payer: Self-pay | Admitting: Family Medicine

## 2016-09-09 NOTE — Telephone Encounter (Signed)
Received fax for prior authorization on Amphetamine Dextroamphetamine sent through cover my meds waiting on determination. - CF

## 2016-09-13 NOTE — Telephone Encounter (Signed)
Received fax from [place] and they denied Amphetamines-Dextroamphetamine due to it is not covered with a diagnosis of ADHD or ADD that is unknown if the diagnosis has been appropriately documented. - CF

## 2016-09-21 ENCOUNTER — Telehealth: Payer: Self-pay | Admitting: *Deleted

## 2016-09-21 NOTE — Telephone Encounter (Signed)
Called pt to inform her that her insurance sent a letter informing our office that she does not have a Dx of ATHD or ADD and is unknown if the Dx if this has been appropriately documented. I called her to find out if she has ever been tested and when this was done. She stated that she has never been tested. I informed her that she will need to have some formal testing done in order to continue this medication and that we have a assessment that she can come in and complete to send in to see if this would be enough documentation for them to use for her to continue to get her medications. She stated that she has an appt tomorrow with Dr. Darene Lamer and can complete it at that time.   Also she wanted to let Dr. Madilyn Fireman know that for the past 3 weeks she has been having episodes of pooping "pebbles" 3-4 x a day. She denies any blood or pain, gas, or diarrhea, no changes in diet she states that is has been hard to pass at times. She said that she did p/u some fiber however she has not used it yet. I asked her about her water consumption and she stated that it was about the same. Will fwd to pcp for advice.Audelia Hives [place]

## 2016-09-22 ENCOUNTER — Encounter: Payer: Self-pay | Admitting: Sports Medicine

## 2016-09-22 ENCOUNTER — Telehealth: Payer: Self-pay | Admitting: Sports Medicine

## 2016-09-22 ENCOUNTER — Ambulatory Visit (INDEPENDENT_AMBULATORY_CARE_PROVIDER_SITE_OTHER): Payer: BC Managed Care – PPO | Admitting: Sports Medicine

## 2016-09-22 DIAGNOSIS — M1712 Unilateral primary osteoarthritis, left knee: Secondary | ICD-10-CM

## 2016-09-22 MED ORDER — TRAMADOL HCL 50 MG PO TABS
ORAL_TABLET | ORAL | 0 refills | Status: DC
Start: 2016-09-22 — End: 2016-10-21

## 2016-09-22 NOTE — Telephone Encounter (Signed)
Will submit for OV benefits investigation.

## 2016-09-22 NOTE — Assessment & Plan Note (Signed)
Aspiration and injection of knee as well as a Baker's cyst. Severe osteoarthritis on x-rays. Unfortunately insufficient relief with injections, I am going to set her up for viscous supplementation but considering severity of osteoarthritis I think she will proceed fairly quickly total knee arthroplasty. Continue meloxicam, adding tramadol for breakthrough pain. Return to see me when Orthovisc is approved.

## 2016-09-22 NOTE — Telephone Encounter (Signed)
Can start the daily fiber supplement and may need to use miralax for a few days to get stools moving. Use 1 capful mixed with 6-8 oz of a beverage ( not soda) at bedtime for the next several days until stools are soft.

## 2016-09-22 NOTE — Telephone Encounter (Signed)
Pt informed at OV today.Wanda Gonzales [place]  

## 2016-09-22 NOTE — Telephone Encounter (Signed)
-----   Message from Silverio Decamp, MD sent at 09/22/2016 10:30 AM EST ----- Orthovisc approval please, left knee, failed injections, x-ray confirmed, failed NSAIDs. ___________________________________________ Gwen Her. Dianah Field, M.D., ABFM., CAQSM. Primary Care and [employer] [employer] [place] [facility]

## 2016-09-22 NOTE — Progress Notes (Signed)
  Subjective:    CC: Follow-up  HPI: Left knee osteoarthritis: Aspiration and injection of the left knee as well as a left knee ganglion cyst at the last visit provided no relief, she is agreeable to proceed with viscous supplementation.  Past medical history:  Negative.  See flowsheet/record as well for more information.  Surgical history: Negative.  See flowsheet/record as well for more information.  Family history: Negative.  See flowsheet/record as well for more information.  Social history: Negative.  See flowsheet/record as well for more information.  Allergies, and medications have been entered into the medical record, reviewed, and no changes needed.   Review of Systems: No fevers, chills, night sweats, weight loss, chest pain, or shortness of breath.   Objective:    General: Well Developed, well nourished, and in no acute distress.  Neuro: Alert and oriented x3, extra-ocular muscles intact, sensation grossly intact.  HEENT: Normocephalic, atraumatic, pupils equal round reactive to light, neck supple, no masses, no lymphadenopathy, thyroid nonpalpable.  Skin: Warm and dry, no rashes. Cardiac: Regular rate and rhythm, no murmurs rubs or gallops, no lower extremity edema.  Respiratory: Clear to auscultation bilaterally. Not using accessory muscles, speaking in full sentences. Left Knee: Normal to inspection with no erythema or effusion or obvious bony abnormalities. Tender to palpation over the medial joint line as well as the lateral patellar facet ROM normal in flexion and extension and lower leg rotation. Ligaments with solid consistent endpoints including ACL, PCL, LCL, MCL. Negative Mcmurray's and provocative meniscal tests. Non painful patellar compression. Patellar and quadriceps tendons unremarkable. Hamstring and quadriceps strength is normal.  Impression and Recommendations:    Primary osteoarthritis of left knee Aspiration and injection of knee as well as a  Baker's cyst. Severe osteoarthritis on x-rays. Unfortunately insufficient relief with injections, I am going to set her up for viscous supplementation but considering severity of osteoarthritis I think she will proceed fairly quickly total knee arthroplasty. Continue meloxicam, adding tramadol for breakthrough pain. Return to see me when Orthovisc is approved.  I spent 25 minutes with this patient, greater than 50% was face-to-face time counseling regarding the above diagnoses

## 2016-09-24 ENCOUNTER — Other Ambulatory Visit: Payer: Self-pay | Admitting: Family Medicine

## 2016-09-27 NOTE — Telephone Encounter (Signed)
Submitted for approval on Orthovisc. Awaiting confirmation.  

## 2016-10-04 MED ORDER — SODIUM HYALURONATE (VISCOSUP) 20 MG/2ML IX SOSY: 1.0000 | PREFILLED_SYRINGE | INTRA_ARTICULAR | 0 refills | Status: DC

## 2016-10-04 NOTE — Telephone Encounter (Signed)
Received the following information from OV benefits investigation:   Patient has PPO plan with an effective date of 09/19/2016. Orthovisc is not the preferred drug through [facility] and will not be covered until patient has tried and failed with the preferred drug. pre-cert would be required after trying and failing with preferred, pre-cert phone # is [phone]. [id number] is covered at 100% & [id number] is covered at 100% of the contracted rate when performed in an office setting. Deductible does not apply. A copay of $85.00 applies whether or not a specialist office is billed. if out of pocket is met, copay will no longer apply. REF: [id number]  Called [employer], spoke with Cyndia Bent. Was advised that Euflexxa is the preferred drug. Will route to Provider for order. [facility].   Notified Pt of current order status.

## 2016-10-04 NOTE — Addendum Note (Signed)
Addended by: Silverio Decamp on: 10/04/2016 12:40 PM   Modules accepted: Orders

## 2016-10-04 NOTE — Telephone Encounter (Signed)
Done

## 2016-10-18 ENCOUNTER — Ambulatory Visit (INDEPENDENT_AMBULATORY_CARE_PROVIDER_SITE_OTHER): Payer: BC Managed Care – PPO | Admitting: Family Medicine

## 2016-10-18 VITALS — BP 101/72 | HR 77 | Temp 98.0°F

## 2016-10-18 DIAGNOSIS — Z111 Encounter for screening for respiratory tuberculosis: Secondary | ICD-10-CM | POA: Diagnosis not present

## 2016-10-18 DIAGNOSIS — Z23 Encounter for immunization: Secondary | ICD-10-CM

## 2016-10-18 NOTE — Progress Notes (Signed)
Patient came into clinic today for PPD placement. 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 Pt has had PPD test done in the past, never tested positive. Does have a form that needs to be completed. Per form, Pt should be up to date on Hep B and MMR (tetnus is up to date). Pt is going to look at her home records to see if she has these vaccines on file. Pt tolerated PPD placement in right forearm, no immediate complications. Follow up appt for PPD read scheduled. Form will be placed in PCP's box for completion. Patient does question if appeal form was completed for Adderall Rx, will route to PCP for review.  

## 2016-10-18 NOTE — Telephone Encounter (Signed)
Pharmacy required additional information regarding which knee and diagnosis code. Information provided.

## 2016-10-18 NOTE — Progress Notes (Signed)
Routing for review

## 2016-10-18 NOTE — Progress Notes (Signed)
Will need to check with [place] to see if an appeal has been done and what the status is on that.

## 2016-10-20 ENCOUNTER — Ambulatory Visit (INDEPENDENT_AMBULATORY_CARE_PROVIDER_SITE_OTHER): Payer: BC Managed Care – PPO | Admitting: Family Medicine

## 2016-10-20 VITALS — BP 98/51 | HR 76 | Ht 61.0 in | Wt 150.1 lb

## 2016-10-20 DIAGNOSIS — Z111 Encounter for screening for respiratory tuberculosis: Secondary | ICD-10-CM | POA: Diagnosis not present

## 2016-10-20 LAB — TB SKIN TEST
Induration: 0 mm
TB Skin Test: NEGATIVE

## 2016-10-20 NOTE — Progress Notes (Signed)
   Subjective:    Patient ID: Wanda Gonzales, female    DOB: [birth year], 61 y.o.   MRN: [id number]  HPI Pt is here for PPD read.   Review of Systems     Objective:   Physical Exam        Assessment & Plan:  Results were Negative 72mm.

## 2016-10-21 ENCOUNTER — Encounter: Payer: Self-pay | Admitting: Family Medicine

## 2016-10-21 ENCOUNTER — Ambulatory Visit (INDEPENDENT_AMBULATORY_CARE_PROVIDER_SITE_OTHER): Payer: BC Managed Care – PPO | Admitting: Family Medicine

## 2016-10-21 VITALS — BP 107/65 | HR 72 | Ht 61.0 in | Wt 150.0 lb

## 2016-10-21 DIAGNOSIS — F900 Attention-deficit hyperactivity disorder, predominantly inattentive type: Secondary | ICD-10-CM

## 2016-10-21 MED ORDER — AMPHETAMINE-DEXTROAMPHETAMINE 20 MG PO TABS: 20.0000 mg | ORAL_TABLET | Freq: Every day | ORAL | 0 refills | Status: DC

## 2016-10-21 NOTE — Progress Notes (Signed)
Subjective:    CC: ADD  HPI:  ADD- she is currently Off of medication. We were not able to get it improved her insurance because they wanted documentation that she actually had the diagnosis. We had her fill out the form indicating that she did meet DSM-IV criteria that she's not had an evaluation with a behavioral specialist previously. She's been on medication for years and it works well to treat her symptoms.   Past medical history, Surgical history, Family history not pertinant except as noted below, Social history, Allergies, and medications have been entered into the medical record, reviewed, and corrections made.   Review of Systems: No fevers, chills, night sweats, weight loss, chest pain, or shortness of breath.   Objective:    General: Well Developed, well nourished, and in no acute distress.  Neuro: Alert and oriented x3, extra-ocular muscles intact, sensation grossly intact.  HEENT: Normocephalic, atraumatic  Skin: Warm and dry, no rashes. Cardiac: Regular rate and rhythm, no murmurs rubs or gallops, no lower extremity edema.  Respiratory: Clear to auscultation bilaterally. Not using accessory muscles, speaking in full sentences.   Impression and Recommendations:   ADD - will call her insurance to see where we are at on the approval process. The go ahead and place a referral for formal evaluation for ADD just in case it is not approved. And in the short-term she can go ahead and use a coupon card to get a 30 day prescription of Adderall to start. 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  Time spent 15 min, > 50% spent counseling about ADD

## 2016-10-21 NOTE — Progress Notes (Signed)
OK 

## 2016-10-25 ENCOUNTER — Ambulatory Visit: Payer: BC Managed Care – PPO | Admitting: Physician Assistant

## 2016-11-01 ENCOUNTER — Telehealth: Payer: Self-pay | Admitting: *Deleted

## 2016-11-01 NOTE — Telephone Encounter (Signed)
[facility] - Rx #: [id number]

## 2016-11-01 NOTE — Telephone Encounter (Signed)
I do see a denial on file from dec 2017. The patient does have a diagnosis of ADD and has been on medication for years with improvement of her symptoms. Will send a PA and see what inurance says since it has been over 30 days since denial was on file and if denied again will do an appeal

## 2016-11-08 ENCOUNTER — Ambulatory Visit: Payer: Self-pay | Admitting: Family Medicine

## 2016-11-09 ENCOUNTER — Telehealth: Payer: Self-pay

## 2016-11-09 NOTE — Telephone Encounter (Signed)
Pt advised.

## 2016-11-09 NOTE — Telephone Encounter (Signed)
Pt called asking about the status of her knee injections. Please assist.

## 2016-11-09 NOTE — Telephone Encounter (Signed)
Called speciality pharmacy, injections still pending review from benefits. Asked that request be placed as STAT.  Pt advised of current status update. Verbalized understanding.

## 2016-11-29 NOTE — Telephone Encounter (Signed)
Called pharmacy for update. There is for to be signed by Physician, this form was faxed to incorrect fax number. Updated fax number, form to be on the way for completion.  Pt advised of status update.

## 2016-11-30 ENCOUNTER — Other Ambulatory Visit: Payer: Self-pay | Admitting: Sports Medicine

## 2016-11-30 MED ORDER — SODIUM HYALURONATE (VISCOSUP) 25 MG/2.5ML IX SOSY: PREFILLED_SYRINGE | INTRA_ARTICULAR | 0 refills | Status: DC

## 2016-11-30 NOTE — Telephone Encounter (Addendum)
I am going to try Sup[facility] to [facility]

## 2016-11-30 NOTE — Telephone Encounter (Signed)
[facility]. Euflexxa denied by insurance due to benefit plan does cover requesting medication.  Will route to Provider to see if a new Rx can be sent.   Pt advised of status update.

## 2016-11-30 NOTE — Addendum Note (Signed)
Addended by: Silverio Decamp on: 11/30/2016 11:57 AM   Modules accepted: Orders

## 2016-12-09 ENCOUNTER — Ambulatory Visit: Payer: BC Managed Care – PPO | Admitting: Psychology

## 2016-12-14 ENCOUNTER — Emergency Department
Admission: EM | Admit: 2016-12-14 | Discharge: 2016-12-14 | Disposition: A | Discharge status: Home / Self Care | Payer: BC Managed Care – PPO | Source: Home / Self Care | Attending: Family Medicine | Admitting: Family Medicine

## 2016-12-14 ENCOUNTER — Encounter: Payer: Self-pay | Admitting: Emergency Medicine

## 2016-12-14 DIAGNOSIS — R300 Dysuria: Secondary | ICD-10-CM | POA: Diagnosis not present

## 2016-12-14 NOTE — ED Triage Notes (Signed)
Pt c/o dysuria and urgency that started today. Denies fever or back pain.

## 2016-12-14 NOTE — ED Provider Notes (Signed)
CSN: [id number]     Arrival date & time 12/14/16  1541 History   First MD Initiated Contact with Patient 12/14/16 1603     Chief Complaint  Patient presents with  . Dysuria   (Consider location/radiation/quality/duration/timing/severity/associated sxs/prior Treatment) HPI  Wanda Gonzales is a 61 y.o. female presenting to UC with c/o dysuria and urgency that started today while she was at work. Denies abdominal pain, back pain, or hematuria. She has not tried anything for symptoms as she came from work.  Hx of UTIs in the past, symptoms feel similar. She notes she may not be staying well hydrated as much    Past Medical History:  Diagnosis Date  . Abnormal Pap smear, atypical squamous cells of undetermined sign (ASC-US) 1998   treated with cryo, CIN I  . ADD (attention deficit disorder) 02-23-12   easily distracted.  . Arthritis 02-23-12   Rt. hip and lt. arm  . Kidney calculi 07/2015  . Kidney stone   . PONV (postoperative nausea and vomiting) 02-23-12   always has PONV  . Post-nasal drainage 02-23-12   frequent a problem,causes a cough and mucus buildup in throat  . Urethral polyp 1998   Dr. Hartley Barefoot  . Varicose veins   . Venous insufficiency    chronic-denies any problems   Past Surgical History:  Procedure Laterality Date  . blood clot removed Left 01/2012   blood clot removed from left hand   . BREAST SURGERY  02-23-12   Rt. needle biopsy, recent -negative  path showed fibroadenoma  . COLONOSCOPY  08/2003   repeat 5 yrs.  . COLONOSCOPY W/ BIOPSIES  12/2008   1 polyp recheck 5 yrs  . EXCISION/RELEASE BURSA HIP  02/29/12  . HAND SURGERY  02-23-12   "blood clot" excised palm left hand 01-18-12  . KNEE ARTHROSCOPY W/ MENISCAL REPAIR  10/2012  . KNEE ARTHROSCOPY WITH EXCISION BAKER'S CYST Left 10/2012   meniscal repair  . SHOULDER SURGERY Right 02/2015   Dr. Lennette Bihari Supple - [facility]  . 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 Breast cancer Mother 59    breast  . Hyperlipidemia Mother   . Brain cancer Father 33    brain  . Osteoporosis Maternal Grandmother   . Drug abuse Brother    Social History  Substance Use Topics  . Smoking status: Never Smoker  . Smokeless tobacco: Never Used  . Alcohol use 0.6 oz/week    1 Glasses of wine per week     Comment: rare occ.   OB History    Gravida Para Term Preterm AB Living   0             SAB TAB Ectopic Multiple Live Births                 Review of Systems  Constitutional: Negative for chills and fever.  Gastrointestinal: Negative for abdominal pain, diarrhea, nausea and vomiting.  Genitourinary: Positive for dysuria and urgency. Negative for flank pain, frequency, hematuria and pelvic pain.  Musculoskeletal: Negative for back pain.    Allergies  Codeine; Doxycycline; Concerta [methylphenidate]; and Sulfa antibiotics  Home Medications   Prior to Admission medications   Medication Sig Start Date End Date Taking? Authorizing Provider  amphetamine-dextroamphetamine (ADDERALL) 20 MG tablet Take 1 tablet (20 mg total) by mouth daily. 10/21/16   Rene Kocher  Methe[place], MD  fish oil-omega-3 fatty acids 1000 MG capsule Take 4 g by mouth daily.     Historical Provider, MD  FLUoxetine (PROZAC) 20 MG capsule TAKE 1 CAPSULE (20 MG TOTAL) BY MOUTH DAILY. 09/26/16   Hali Marry, MD  meclizine (ANTIVERT) 25 MG tablet Take 1 tablet (25 mg total) by mouth 3 (three) times daily as needed for dizziness or nausea. 08/25/16   Trixie Dredge, PA-C  Multiple Vitamin (MULTIVITAMIN) tablet Take 1 tablet by mouth daily.     Historical Provider, MD  OVER THE COUNTER MEDICATION Take 2 tablets by mouth daily with breakfast. Vitamin D gummie    Historical Provider, MD  Sodium Hyaluronate (SUPARTZ) 25 MG/2.5ML SOSY Injected intra-articular weekly for 5 weeks, diagnosis: Primary osteoarthritis of the knee 11/30/16   Silverio Decamp, MD   Meds Ordered and Administered this Visit  Medications - No data to display  BP 103/68 (BP Location: Right Arm)   Pulse 83   Temp 98 F (36.7 C) (Oral)   Wt 157 lb (71.2 kg)   LMP 12/18/2013   SpO2 98%   BMI 29.66 kg/m  No data found.   Physical Exam  Constitutional: She is oriented to person, place, and time. She appears well-developed and well-nourished. No distress.  HENT:  Head: Normocephalic and atraumatic.  Mouth/Throat: Oropharynx is clear and moist.  Eyes: EOM are normal.  Neck: Normal range of motion.  Cardiovascular: Normal rate and regular rhythm.   Pulmonary/Chest: Effort normal and breath sounds normal. No respiratory distress. She has no wheezes. She has no rales.  Abdominal: Soft. She exhibits no distension and no mass. There is no tenderness. There is no rebound, no guarding and no CVA tenderness.  Musculoskeletal: Normal range of motion.  Neurological: She is alert and oriented to person, place, and time.  Skin: Skin is warm and dry. She is not diaphoretic.  Psychiatric: She has a normal mood and affect. Her behavior is normal.  Nursing note and vitals reviewed.   Urgent Care Course     Procedures (including critical care time)  Labs Review Labs Reviewed  URINE CULTURE  POCT URINALYSIS DIP (MANUAL ENTRY)    Imaging Review No results found.    MDM   1. Dysuria    Pt c/o dysuria and urgency that started earlier today. UA not convincing for a UTI  Will send urine culture  Encouraged good hydration. May take OTC Azo to help with dysuria. Will be notified when culture results come back. Encouraged f/u with PCP in 1 week if not improving.    Noland Fordyce, PA-C 12/14/16 1659

## 2016-12-17 ENCOUNTER — Telehealth: Payer: Self-pay | Admitting: Emergency Medicine

## 2016-12-17 LAB — URINE CULTURE

## 2016-12-17 NOTE — Telephone Encounter (Signed)
Called patient to tell her positive urine culture; will phone order for Macrobid 100mg  q 12 hours x 7 days per Dr.Beese.

## 2017-01-03 NOTE — Telephone Encounter (Signed)
Called speciality pharmacy, initiated PA.  Pt advised of current status. Pt would like to be referred to Ortho at this point due to long insurance process with knee injections. Will place referral.

## 2017-01-03 NOTE — Addendum Note (Signed)
Addended by: Huel Cote on: 01/03/2017 03:16 PM   Modules accepted: Orders

## 2017-01-04 ENCOUNTER — Other Ambulatory Visit: Payer: Self-pay | Admitting: Family Medicine

## 2017-01-04 ENCOUNTER — Ambulatory Visit: Payer: BC Managed Care – PPO | Admitting: Family Medicine

## 2017-01-04 DIAGNOSIS — R300 Dysuria: Secondary | ICD-10-CM

## 2017-01-04 NOTE — Progress Notes (Signed)
Pt was treated at [facility] for UTI. Symptoms returned after antibiotic treatment. Per PCP, lab orders placed.

## 2017-01-04 NOTE — Telephone Encounter (Signed)
Received approval for [employer]. Pt is going to try ortho and see what they say. The approval is valid from 01/03/17-01/03/18. We will hold on to this in case Pt wants to compelte series during valid range. Rx would need to be sent to [facility] (F: [phone]). PA #: [place] [id number] [facility].   Will send approval letter to be scanned.

## 2017-01-05 LAB — URINALYSIS, ROUTINE W REFLEX MICROSCOPIC
Bilirubin Urine: NEGATIVE
Glucose, UA: NEGATIVE
Hgb urine dipstick: NEGATIVE
Ketones, ur: NEGATIVE
Nitrite: NEGATIVE
Protein, ur: NEGATIVE
Specific Gravity, Urine: 1.028 (ref 1.001–1.035)
pH: 5.5 (ref 5.0–8.0)

## 2017-01-05 LAB — URINALYSIS, MICROSCOPIC ONLY
Bacteria, UA: NONE SEEN [HPF]
Casts: NONE SEEN [LPF]
RBC / HPF: NONE SEEN RBC/HPF (ref <=2)
Yeast: NONE SEEN [HPF]

## 2017-01-06 ENCOUNTER — Telehealth: Payer: Self-pay

## 2017-01-06 LAB — URINE CULTURE: Organism ID, Bacteria: NO GROWTH

## 2017-01-11 NOTE — Telephone Encounter (Signed)
Opened in error

## 2017-02-17 NOTE — Telephone Encounter (Signed)
Patient is ready to have the Supartz injections.

## 2017-02-20 MED ORDER — SODIUM HYALURONATE (VISCOSUP) 25 MG/2.5ML IX SOSY: PREFILLED_SYRINGE | INTRA_ARTICULAR | 0 refills | Status: DC

## 2017-02-20 NOTE — Addendum Note (Signed)
Addended by: Huel Cote on: 02/20/2017 10:54 AM   Modules accepted: Orders

## 2017-02-20 NOTE — Telephone Encounter (Signed)
Rx sent, Pt advised.  

## 2017-02-23 ENCOUNTER — Telehealth: Payer: Self-pay

## 2017-02-23 DIAGNOSIS — M1712 Unilateral primary osteoarthritis, left knee: Secondary | ICD-10-CM

## 2017-02-23 NOTE — Telephone Encounter (Signed)
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

## 2017-02-23 NOTE — Telephone Encounter (Signed)
If she is able to eat eggs and chicken then Wanda Gonzales is okay.  Please let pharmacy know.

## 2017-02-23 NOTE — Telephone Encounter (Signed)
Specialty pharmacy called stating pt has a bird allergy and medication selected is contraindicated. Please advise.

## 2017-02-23 NOTE — Telephone Encounter (Signed)
We have no record of this allergy, would you please call the patient and see if she is able to tolerate chicken and/or eggs?

## 2017-02-24 MED ORDER — SODIUM HYALURONATE (VISCOSUP) 25 MG/2.5ML IX SOSY: PREFILLED_SYRINGE | INTRA_ARTICULAR | 0 refills | Status: DC

## 2017-02-24 NOTE — Telephone Encounter (Signed)
Informed pt of information. Attempted to continue process with specialty pharmacy who stated her request had been cancelled due to the pt stating she's allergic to birds(feathers). All orders will need to be placed again to restart process. Please assist.

## 2017-02-24 NOTE — Telephone Encounter (Signed)
No problem, I sent it again.

## 2017-03-03 ENCOUNTER — Ambulatory Visit (INDEPENDENT_AMBULATORY_CARE_PROVIDER_SITE_OTHER): Payer: BC Managed Care – PPO | Admitting: Family Medicine

## 2017-03-03 VITALS — BP 86/60 | HR 84 | Temp 97.9°F

## 2017-03-03 DIAGNOSIS — N898 Other specified noninflammatory disorders of vagina: Secondary | ICD-10-CM

## 2017-03-03 DIAGNOSIS — R300 Dysuria: Secondary | ICD-10-CM | POA: Diagnosis not present

## 2017-03-03 DIAGNOSIS — L298 Other pruritus: Secondary | ICD-10-CM | POA: Diagnosis not present

## 2017-03-03 LAB — POCT URINALYSIS DIPSTICK
Bilirubin, UA: NEGATIVE
Blood, UA: NEGATIVE
Glucose, UA: NEGATIVE
Ketones, UA: NEGATIVE
Nitrite, UA: NEGATIVE
Protein, UA: NEGATIVE
Spec Grav, UA: 1.02 (ref 1.010–1.025)
Urobilinogen, UA: 0.2 E.U./dL
pH, UA: 5.5 (ref 5.0–8.0)

## 2017-03-03 MED ORDER — CEPHALEXIN 500 MG PO CAPS: 500.0000 mg | ORAL_CAPSULE | Freq: Three times a day (TID) | ORAL | 0 refills | Status: DC

## 2017-03-03 MED ORDER — SULFAMETHOXAZOLE-TRIMETHOPRIM 800-160 MG PO TABS: 1.0000 | ORAL_TABLET | Freq: Two times a day (BID) | ORAL | 0 refills | Status: DC

## 2017-03-03 NOTE — Progress Notes (Signed)
Called pharmacy and called Rx for Bactrim. They will fill Keflex.   Pt advised, verbalized understanding.

## 2017-03-03 NOTE — Progress Notes (Signed)
Pt came into clinic today for possible uti. Pt reports dysuria for the past 2 days. Today she reports some vaginal itching too. POC urine dipstick preformed in clinic. Urine sent for culture and wet prep sent for review. Pt advised we would contact her with results. Pt would like any Rx's sent to local pharmacy.

## 2017-03-03 NOTE — Progress Notes (Signed)
Call patient: Urine positive only for leukocytes. We'll go ahead and send a prescription to the pharmacy for Keflex. Accidentally sent Bactrim. Please have them cancel this and just filled a new prescription for Keflex. Call if not better after one week. Will call with the wet prep and culture results.

## 2017-03-04 LAB — WET PREP FOR TRICH, YEAST, CLUE
Trich, Wet Prep: NONE SEEN
Yeast Wet Prep HPF POC: NONE SEEN

## 2017-03-04 LAB — URINE CULTURE: Organism ID, Bacteria: NO GROWTH

## 2017-03-09 NOTE — Telephone Encounter (Signed)
Rx to be delivered 03/10/17. Pt advised.

## 2017-03-11 MED ORDER — METRONIDAZOLE 500 MG PO TABS: 500.0000 mg | ORAL_TABLET | Freq: Two times a day (BID) | ORAL | 0 refills | Status: DC

## 2017-03-11 NOTE — Addendum Note (Signed)
Addended by: Beatrice Lecher D on: 03/11/2017 10:21 PM   Modules accepted: Orders

## 2017-03-14 ENCOUNTER — Ambulatory Visit (INDEPENDENT_AMBULATORY_CARE_PROVIDER_SITE_OTHER): Payer: BC Managed Care – PPO | Admitting: Sports Medicine

## 2017-03-14 DIAGNOSIS — M1712 Unilateral primary osteoarthritis, left knee: Secondary | ICD-10-CM

## 2017-03-14 DIAGNOSIS — M7122 Synovial cyst of popliteal space [Baker], left knee: Secondary | ICD-10-CM | POA: Diagnosis not present

## 2017-03-14 NOTE — Progress Notes (Signed)
Subjective:    CC: Knee pain  HPI: Wanda Gonzales was initially here for a left knee Supartz injection number one, she is complaining of significant swelling and pain on the posterior aspect of her left knee, she does have a known Baker's cyst and desires interventional treatment for the Baker's cyst today as well as her Supartz injection. Pain is mild, persistent. Localized on the posterior medial joint line of the knee.   Past medical history:  Negative.  See flowsheet/record as well for more information.  Surgical history: Negative.  See flowsheet/record as well for more information.  Family history: Negative.  See flowsheet/record as well for more information.  Social history: Negative.  See flowsheet/record as well for more information.  Allergies, and medications have been entered into the medical record, reviewed, and no changes needed.   Review of Systems: No fevers, chills, night sweats, weight loss, chest pain, or shortness of breath.   Objective:    General: Well Developed, well nourished, and in no acute distress.  Neuro: Alert and oriented x3, extra-ocular muscles intact, sensation grossly intact.  HEENT: Normocephalic, atraumatic, pupils equal round reactive to light, neck supple, no masses, no lymphadenopathy, thyroid nonpalpable.  Skin: Warm and dry, no rashes. Cardiac: Regular rate and rhythm, no murmurs rubs or gallops, no lower extremity edema.  Respiratory: Clear to auscultation bilaterally. Not using accessory muscles, speaking in full sentences. Left Knee: Minimally swollen knee with effusion, medial joint line pain, she also has a palpable but small fullness at the crux of the gastrocnemius and semimembranosus. ROM normal in flexion and extension and lower leg rotation. Ligaments with solid consistent endpoints including ACL, PCL, LCL, MCL. Negative Mcmurray's and provocative meniscal tests. Non painful patellar compression. Patellar and quadriceps tendons  unremarkable. Hamstring and quadriceps strength is normal.  Procedure: Real-time Ultrasound Guided aspiration/injection of left knee Baker's cyst Device: GE Logiq E  Verbal informed consent obtained.  Time-out conducted.  Noted no overlying erythema, induration, or other signs of local infection.  Skin prepped in a sterile fashion.  Local anesthesia: Topical Ethyl chloride.  With sterile technique and under real time ultrasound guidance: Noted a hypoechoic mass at the junction of the distal semimembranosus and proximal gastrocnemius, I advanced a 18-gauge needle into this and aspirated about 5 mL straw-colored fluid, syringe switched and 1 mL Kenalog 40, 1 mL lidocaine injected easily.  Completed without difficulty  Pain immediately resolved suggesting accurate placement of the medication.  Advised to call if fevers/chills, erythema, induration, drainage, or persistent bleeding.  Images permanently stored and available for review in the ultrasound unit.  Impression: Technically successful ultrasound guided injection.  Procedure: Real-time Ultrasound Guided Injection of left knee Device: GE Logiq E  Verbal informed consent obtained.  Time-out conducted.  Noted no overlying erythema, induration, or other signs of local infection.  Skin prepped in a sterile fashion.  Local anesthesia: Topical Ethyl chloride.  With sterile technique and under real time ultrasound guidance:  25 mg/2.5 mL of Supartz (sodium hyaluronate) in a prefilled syringe was injected easily into the knee through a 22-gauge needle. Completed without difficulty  Pain immediately resolved suggesting accurate placement of the medication.  Advised to call if fevers/chills, erythema, induration, drainage, or persistent bleeding.  Images permanently stored and available for review in the ultrasound unit.  Impression: Technically successful ultrasound guided injection.  The knee was then strapped with compressive  dressing.  Impression and Recommendations:    Baker's cyst of knee, left Aspiration and injection. Strapped with  compressive dressing. Return in 1 month.  Primary osteoarthritis of left knee Supartz injection #1, left knee, return in 1 week for injection number 2 of 5.

## 2017-03-14 NOTE — Assessment & Plan Note (Signed)
Aspiration and injection. Strapped with compressive dressing. Return in 1 month.

## 2017-03-14 NOTE — Assessment & Plan Note (Signed)
Supartz injection #1, left knee, return in 1 week for injection number 2 of 5.

## 2017-03-21 ENCOUNTER — Ambulatory Visit (INDEPENDENT_AMBULATORY_CARE_PROVIDER_SITE_OTHER): Payer: BC Managed Care – PPO | Admitting: Sports Medicine

## 2017-03-21 ENCOUNTER — Encounter: Payer: Self-pay | Admitting: Sports Medicine

## 2017-03-21 DIAGNOSIS — M1712 Unilateral primary osteoarthritis, left knee: Secondary | ICD-10-CM

## 2017-03-21 NOTE — Progress Notes (Signed)
   Procedure: Real-time Ultrasound Guided Injection of left knee Device: GE Logiq E  Verbal informed consent obtained.  Time-out conducted.  Noted no overlying erythema, induration, or other signs of local infection.  Skin prepped in a sterile fashion.  Local anesthesia: Topical Ethyl chloride.  With sterile technique and under real time ultrasound guidance:  25 mg/2.5 mL of Supartz (sodium hyaluronate) in a prefilled syringe was injected easily into the knee through a 22-gauge needle. Completed without difficulty  Pain immediately resolved suggesting accurate placement of the medication.  Advised to call if fevers/chills, erythema, induration, drainage, or persistent bleeding.  Images permanently stored and available for review in the ultrasound unit.  Impression: Technically successful ultrasound guided injection.  Impression and Recommendations:    Primary osteoarthritis of left knee Supartz injection #2 of 5, return in one week for #3.

## 2017-03-21 NOTE — Assessment & Plan Note (Signed)
Supartz injection #2 of 5, return in one week for #3.

## 2017-03-21 NOTE — Addendum Note (Signed)
Addended by: Doree Albee on: 03/21/2017 03:46 PM   Modules accepted: Orders

## 2017-03-28 ENCOUNTER — Encounter: Payer: Self-pay | Admitting: Sports Medicine

## 2017-03-28 ENCOUNTER — Ambulatory Visit (INDEPENDENT_AMBULATORY_CARE_PROVIDER_SITE_OTHER): Payer: BC Managed Care – PPO | Admitting: Sports Medicine

## 2017-03-28 DIAGNOSIS — M1712 Unilateral primary osteoarthritis, left knee: Secondary | ICD-10-CM

## 2017-03-28 NOTE — Progress Notes (Signed)
   Procedure: Real-time Ultrasound Guided Injection of left knee Device: GE Logiq E  Verbal informed consent obtained.  Time-out conducted.  Noted no overlying erythema, induration, or other signs of local infection.  Skin prepped in a sterile fashion.  Local anesthesia: Topical Ethyl chloride.  With sterile technique and under real time ultrasound guidance:  25 mg/2.5 mL of Supartz (sodium hyaluronate) in a prefilled syringe was injected easily into the knee through a 22-gauge needle. Completed without difficulty  Pain immediately resolved suggesting accurate placement of the medication.  Advised to call if fevers/chills, erythema, induration, drainage, or persistent bleeding.  Images permanently stored and available for review in the ultrasound unit.  Impression: Technically successful ultrasound guided injection.  Impression and Recommendations:    Primary osteoarthritis of left knee Supartz injection #3 of 5 into the left knee, return in one week for #4 of 5

## 2017-03-28 NOTE — Assessment & Plan Note (Signed)
Supartz injection #3 of 5 into the left knee, return in one week for #4 of 5

## 2017-03-30 ENCOUNTER — Emergency Department (INDEPENDENT_AMBULATORY_CARE_PROVIDER_SITE_OTHER): Payer: BC Managed Care – PPO

## 2017-03-30 ENCOUNTER — Emergency Department
Admission: EM | Admit: 2017-03-30 | Discharge: 2017-03-30 | Disposition: A | Discharge status: Home / Self Care | Payer: BC Managed Care – PPO | Source: Home / Self Care | Attending: Family Medicine | Admitting: Family Medicine

## 2017-03-30 DIAGNOSIS — S92352A Displaced fracture of fifth metatarsal bone, left foot, initial encounter for closed fracture: Secondary | ICD-10-CM | POA: Diagnosis not present

## 2017-03-30 DIAGNOSIS — W19XXXA Unspecified fall, initial encounter: Secondary | ICD-10-CM | POA: Diagnosis not present

## 2017-03-30 DIAGNOSIS — M79672 Pain in left foot: Secondary | ICD-10-CM | POA: Diagnosis not present

## 2017-03-30 DIAGNOSIS — M25572 Pain in left ankle and joints of left foot: Secondary | ICD-10-CM

## 2017-03-30 NOTE — Discharge Instructions (Signed)
Apply ice pack for 30 minutes every 1 to 2 hours today and tomorrow.  Elevate. Wear cam walker.  Use crutches.  May take Tylenol for pain.

## 2017-03-30 NOTE — ED Triage Notes (Signed)
Pt stepped off her deck around 4 pm today and rolled her left foot.  Swelling and pain noted on the left aspects of foot.  Scrape on top of left foot.

## 2017-03-30 NOTE — ED Provider Notes (Signed)
[facility]    CSN: [id number] Arrival date & time: 03/30/17  1837     History   Chief Complaint Chief Complaint  Patient presents with  . Foot Pain    left    HPI Wanda Gonzales is a 61 y.o. female.   Patient stepped off her deck about 4 hours ago, inverting her left ankle/foot.    Ankle Pain  Location:  Ankle and foot Time since incident:  4 hours Injury: yes   Mechanism of injury comment:  Inverted ankle Ankle location:  L ankle Foot location:  L foot Pain details:    Quality:  Aching   Radiates to:  Does not radiate   Severity:  Moderate   Onset quality:  Sudden   Duration:  4 hours   Timing:  Constant   Progression:  Unchanged Chronicity:  New Dislocation: no   Prior injury to area:  No Relieved by:  Nothing Worsened by:  Bearing weight Ineffective treatments:  Ice and rest Associated symptoms: decreased ROM, stiffness and swelling   Associated symptoms: no muscle weakness, no numbness and no tingling     Past Medical History:  Diagnosis Date  . Abnormal Pap smear, atypical squamous cells of undetermined sign (ASC-US) 1998   treated with cryo, CIN I  . ADD (attention deficit disorder) 02-23-12   easily distracted.  . Arthritis 02-23-12   Rt. hip and lt. arm  . Kidney calculi 07/2015  . Kidney stone   . PONV (postoperative nausea and vomiting) 02-23-12   always has PONV  . Post-nasal drainage 02-23-12   frequent a problem,causes a cough and mucus buildup in throat  . Urethral polyp 1998   Dr. Hartley Barefoot  . Varicose veins   . Venous insufficiency    chronic-denies any problems    Patient Active Problem List   Diagnosis Date Noted  . Primary osteoarthritis of left knee 08/25/2016  . Baker's cyst of knee, left 08/25/2016  . Benign paroxysmal positional vertigo 08/25/2016  . Back pain 12/16/2015  . Cervical strain 11/21/2014  . Right shoulder pain 05/15/2014  . DDD (degenerative disc disease), lumbar 02/17/2014  . Unspecified vitamin D  deficiency 03/18/2013  . Trochanteric bursitis of right hip 02/29/2012  . INSOMNIA 04/13/2010  . HYPERLIPIDEMIA 09/17/2007  . CERVICAL POLYP 09/17/2007  . Attention deficit disorder 09/11/2007  . UNSPECIFIED VENOUS INSUFFICIENCY 08/29/2007    Past Surgical History:  Procedure Laterality Date  . blood clot removed Left 01/2012   blood clot removed from left hand   . BREAST SURGERY  02-23-12   Rt. needle biopsy, recent -negative  path showed fibroadenoma  . COLONOSCOPY  08/2003   repeat 5 yrs.  . COLONOSCOPY W/ BIOPSIES  12/2008   1 polyp recheck 5 yrs  . EXCISION/RELEASE BURSA HIP  02/29/12  . HAND SURGERY  02-23-12   "blood clot" excised palm left hand 01-18-12  . KNEE ARTHROSCOPY W/ MENISCAL REPAIR  10/2012  . KNEE ARTHROSCOPY WITH EXCISION BAKER'S CYST Left 10/2012   meniscal repair  . SHOULDER SURGERY Right 02/2015   Dr. Lennette Bihari Supple - [facility]  . 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 Authorizing Provider  fish oil-omega-3 fatty acids 1000 MG capsule Take 4 g by mouth daily.     [provider]  FLUoxetine (PROZAC) 20 MG capsule TAKE 1 CAPSULE (20 MG TOTAL) BY MOUTH DAILY. 09/26/16   Hali Marry, MD  meclizine (ANTIVERT) 25 MG tablet Take 1 tablet (25 mg total) by mouth 3 (three) times daily as needed for dizziness or nausea. 08/25/16   Trixie Dredge, PA-C  Multiple Vitamin (MULTIVITAMIN) tablet Take 1 tablet by mouth daily.     [provider]  OVER THE COUNTER MEDICATION Take 2 tablets by mouth daily with breakfast. Vitamin D gummie    [provider]  Sodium Hyaluronate (SUPARTZ) 25 MG/2.5ML SOSY Injected intra-articular in left knee weekly for 5 weeks Dx:  Primary osteoarthritis of left knee (M17.12) 02/24/17   Silverio Decamp, MD    Family History Family History  Problem Relation Age of Onset  . Breast cancer Mother 92       breast  . Hyperlipidemia Mother   . Brain cancer Father 84       brain  . Osteoporosis Maternal Grandmother   . Drug abuse Brother     Social History Social History  Substance Use Topics  . Smoking status: Never Smoker  . Smokeless tobacco: Never Used  . Alcohol use 0.6 oz/week    1 Glasses of wine per week     Comment: rare occ.     Allergies   Codeine; Doxycycline; Concerta [methylphenidate]; and Sulfa antibiotics   Review of Systems Review of Systems  Musculoskeletal: Positive for stiffness.  All other systems reviewed and are negative.    Physical Exam Triage Vital Signs ED Triage Vitals  Enc Vitals Group     BP 03/30/17 1900 100/68     Pulse Rate 03/30/17 1900 76     Resp --      Temp 03/30/17 1900 98.4 F (36.9 C)     Temp Source 03/30/17 1900 Oral     SpO2 03/30/17 1900 96 %     Weight 03/30/17 1900 155 lb (70.3 kg)     Height 03/30/17 1900 5\' 1"  (1.549 m)     Head Circumference --      Peak Flow --      Pain Score 03/30/17 1901 8     Pain Loc --      Pain Edu? --      Excl. in [place]? --    No data found.   Updated Vital Signs BP 100/68 (BP Location: Left Arm)   Pulse 76   Temp 98.4 F (36.9 C) (Oral)   Ht 5\' 1"  (1.549 m)   Wt 155 lb (70.3 kg)   LMP 12/18/2013   SpO2 96%   BMI 29.29 kg/m   Visual Acuity Right Eye Distance:   Left Eye Distance:   Bilateral Distance:    Right Eye Near:   Left Eye Near:    Bilateral Near:     Physical Exam  Constitutional: She appears well-developed and well-nourished. No distress.  HENT:  Head: Atraumatic.  Eyes: Pupils are equal, round, and reactive to light.  Neck: Normal range of motion.  Cardiovascular: Normal rate.   Pulmonary/Chest: Effort normal.  Musculoskeletal:       Left foot: There is tenderness, bony  tenderness and swelling. There is normal capillary refill, no deformity and no  laceration.       Feet:  Left foot has ecchymosis, swelling, and tenderness to palpation dorsally over the 5th metatarsal.  Distal neurovascular function is intact.  Left ankle:  Good range of motion.  No swelling.  Mild tenderness to palpation over the lateral malleolus.  Joint stable.   Neurological: She is alert.  Skin: Skin is warm and dry.  Nursing note and vitals reviewed.    UC Treatments / Results  Labs (all labs ordered are listed, but only abnormal results are displayed) Labs Reviewed - No data to display  EKG  EKG Interpretation None       Radiology Dg Ankle Complete Left  Result Date: 03/30/2017 CLINICAL DATA:  Fall with foot pain EXAM: LEFT ANKLE COMPLETE - 3+ VIEW COMPARISON:  None. FINDINGS: No acute displaced fracture or malalignment. Ankle mortise is symmetric. Patient fifth metatarsal fracture is better demonstrated on the foot radiographs. IMPRESSION: No acute osseous abnormality at the left ankle Electronically Signed   By: Donavan Foil M.D.   On: 03/30/2017 19:29   Dg Foot Complete Left  Result Date: 03/30/2017 CLINICAL DATA:  Fall , pain to the lateral foot and ankle EXAM: LEFT FOOT - COMPLETE 3+ VIEW COMPARISON:  None. FINDINGS: Acute, slightly comminuted fracture involving the mid to distal shaft of the fifth metatarsal. Slightly greater than 1/2 bone with of medial displacement of distal fracture fragment, approximately 4 mm of separation of fracture fragments. No subluxation. IMPRESSION: Acute comminuted, displaced and slightly separated fracture involving the mid to distal shaft of the fifth metatarsal. Electronically Signed   By: Donavan Foil M.D.   On: 03/30/2017 19:25    Procedures Procedures (including critical care time)  Medications Ordered in UC Medications - No data to display   Initial Impression / Assessment and Plan / UC Course  I have reviewed the triage  vital signs and the nursing notes.  Pertinent labs & imaging results that were available during my care of the patient were reviewed by me and considered in my medical decision making (see chart for details).    Applied cam walker.  Dispensed crutches. Apply ice pack for 30 minutes every 1 to 2 hours today and tomorrow.  Elevate. Wear cam walker.  Use crutches.  May take Tylenol for pain.  Followup with Dr. Aundria Mems tomorrow for fracture management.    Final Clinical Impressions(s) / UC Diagnoses   Final diagnoses:  Closed displaced fracture of fifth metatarsal bone of left foot, initial encounter    New Prescriptions New Prescriptions   No medications on file     Kandra Nicolas, MD 03/31/17 [phone]

## 2017-03-31 ENCOUNTER — Ambulatory Visit (INDEPENDENT_AMBULATORY_CARE_PROVIDER_SITE_OTHER): Payer: BC Managed Care – PPO

## 2017-03-31 ENCOUNTER — Encounter: Payer: Self-pay | Admitting: Sports Medicine

## 2017-03-31 ENCOUNTER — Ambulatory Visit (INDEPENDENT_AMBULATORY_CARE_PROVIDER_SITE_OTHER): Payer: BC Managed Care – PPO | Admitting: Sports Medicine

## 2017-03-31 DIAGNOSIS — S92352A Displaced fracture of fifth metatarsal bone, left foot, initial encounter for closed fracture: Secondary | ICD-10-CM | POA: Diagnosis not present

## 2017-03-31 DIAGNOSIS — W19XXXD Unspecified fall, subsequent encounter: Secondary | ICD-10-CM | POA: Diagnosis not present

## 2017-03-31 DIAGNOSIS — S92352D Displaced fracture of fifth metatarsal bone, left foot, subsequent encounter for fracture with routine healing: Secondary | ICD-10-CM

## 2017-03-31 HISTORY — DX: Displaced fracture of fifth metatarsal bone, left foot, initial encounter for closed fracture: S92.352A

## 2017-03-31 MED ORDER — AMBULATORY NON FORMULARY MEDICATION: 0 refills | Status: DC

## 2017-03-31 MED ORDER — HYDROCODONE-ACETAMINOPHEN 5-325 MG PO TABS: 1.0000 | ORAL_TABLET | Freq: Four times a day (QID) | ORAL | 0 refills | Status: DC | PRN

## 2017-03-31 NOTE — Assessment & Plan Note (Signed)
Attempted reduction, return early next week for x-rays. Hydrocodone for pain, nonweightbearing with a boot.  I billed a fracture code for this encounter, all subsequent visits will be post-op checks in the global period.

## 2017-03-31 NOTE — Progress Notes (Signed)
   Subjective:    I'm seeing this patient as a consultation for:  Dr. Beatrice Lecher, Dr. Theone Murdoch  CC: Foot fracture  HPI: A couple of days ago this 61 year old female fell offered that, she had immediate pain, swelling, deformity in her left foot. She was seen in urgent care where x-rays showed an angulated and displaced fracture at the neck of the fifth metatarsal on the left foot. She was placed in a boot and referred to me for further evaluation and definitive treatment, pain is severe, persistent, localized.   Past medical history, Surgical history, Family history not pertinant except as noted below, Social history, Allergies, and medications have been entered into the medical record, reviewed, and no changes needed.   Review of Systems: No headache, visual changes, nausea, vomiting, diarrhea, constipation, dizziness, abdominal pain, skin rash, fevers, chills, night sweats, weight loss, swollen lymph nodes, body aches, joint swelling, muscle aches, chest pain, shortness of breath, mood changes, visual or auditory hallucinations.   Objective:   General: Well Developed, well nourished, and in no acute distress.  Neuro:  Extra-ocular muscles intact, able to move all 4 extremities, sensation grossly intact.  Deep tendon reflexes tested were normal. Psych: Alert and oriented, mood congruent with affect. ENT:  Ears and nose appear unremarkable.  Hearing grossly normal. Neck: Unremarkable overall appearance, trachea midline.  No visible thyroid enlargement. Eyes: Conjunctivae and lids appear unremarkable.  Pupils equal and round. Skin: Warm and dry, no rashes noted.  Cardiovascular: Pulses palpable, no extremity edema. Left Foot: Visibly swollen, bruised, shortening of the fifth digit Range of motion is full in all directions. Strength is 5/5 in all directions. No hallux valgus. No pes cavus or pes planus. No abnormal callus noted. No pain over the navicular prominence, or base  of fifth metatarsal. No tenderness to palpation of the calcaneal insertion of plantar fascia. No pain at the Achilles insertion. No pain over the calcaneal bursa. No pain of the retrocalcaneal bursa. No hallux rigidus or limitus. No tenderness palpation over interphalangeal joints. No pain with compression of the metatarsal heads. Neurovascularly intact distally.  X-rays reviewed and show a laterally displaced, approximately 50% fracture, comminuted of the neck of the fifth metatarsal.  Procedure:  Fracture Reduction   Risks, benefits, and alternatives explained and consent obtained. Time out conducted. Surface prepped with alcohol. 2 mL lidocaine, 4 mL bupivacaine infiltrated in a hematoma block. Adequate anesthesia ensured. Fracture reduction: I accentuated the fracture fragments, and applied axial force to the fifth digit, I then reduced the fracture. Post reduction films obtained showed anatomic/near-anatomic alignment. Pt stable, aftercare and follow-up advised.  X-rays reviewed and show some residual approximately 25% displacement without angulation.  Impression and Recommendations:   This case required medical decision making of moderate complexity.  Closed displaced fracture of fifth metatarsal bone of left foot Attempted reduction, return early next week for x-rays. Hydrocodone for pain, nonweightbearing with a boot.  I billed a fracture code for this encounter, all subsequent visits will be post-op checks in the global period.

## 2017-04-01 ENCOUNTER — Telehealth: Payer: Self-pay | Admitting: Emergency Medicine

## 2017-04-01 NOTE — Telephone Encounter (Signed)
Courtsey Call to patient she states she is trying to take it easy.

## 2017-04-04 ENCOUNTER — Ambulatory Visit (INDEPENDENT_AMBULATORY_CARE_PROVIDER_SITE_OTHER): Payer: BC Managed Care – PPO

## 2017-04-04 ENCOUNTER — Encounter: Payer: Self-pay | Admitting: Sports Medicine

## 2017-04-04 ENCOUNTER — Ambulatory Visit (INDEPENDENT_AMBULATORY_CARE_PROVIDER_SITE_OTHER): Payer: BC Managed Care – PPO | Admitting: Sports Medicine

## 2017-04-04 DIAGNOSIS — M1712 Unilateral primary osteoarthritis, left knee: Secondary | ICD-10-CM | POA: Diagnosis not present

## 2017-04-04 DIAGNOSIS — W19XXXD Unspecified fall, subsequent encounter: Secondary | ICD-10-CM | POA: Diagnosis not present

## 2017-04-04 DIAGNOSIS — S92352D Displaced fracture of fifth metatarsal bone, left foot, subsequent encounter for fracture with routine healing: Secondary | ICD-10-CM

## 2017-04-04 NOTE — Progress Notes (Addendum)
  Subjective: Several days post-closed reduction of a left fifth metatarsal neck fracture, pain-free.  Also here for Supartz No. 4 into the left knee, pain-free.   Objective: General: Well-developed, well-nourished, and in no acute distress. Left foot: Bruised, swollen.  Procedure: Real-time Ultrasound Guided Injection of left knee Device: GE Logiq E  Verbal informed consent obtained.  Time-out conducted.  Noted no overlying erythema, induration, or other signs of local infection.  Skin prepped in a sterile fashion.  Local anesthesia: Topical Ethyl chloride.  With sterile technique and under real time ultrasound guidance:   25 mg/2.5 mL of Supartz (sodium hyaluronate) in a prefilled syringe was injected easily into the knee through a 22-gauge needle. Completed without difficulty  Pain immediately resolved suggesting accurate placement of the medication.  Advised to call if fevers/chills, erythema, induration, drainage, or persistent bleeding.  Images permanently stored and available for review in the ultrasound unit.  Impression: Technically successful ultrasound guided injection.  Assessment/plan:   Primary osteoarthritis of left knee Supartz No. 4 into the left knee, return in one week for #5, pain-free currently.  Closed displaced fracture of fifth metatarsal bone of left foot Status post partially successful closed reduction 5 days ago. Repeat x-rays today, I do suspect we will need to proceed to ORIF.  Displacement and shortening is still too great on new x-rays, referral for ORIF.

## 2017-04-04 NOTE — Addendum Note (Signed)
Addended by: Silverio Decamp on: 04/04/2017 04:04 PM   Modules accepted: Orders

## 2017-04-04 NOTE — Assessment & Plan Note (Signed)
Supartz No. 4 into the left knee, return in one week for #5, pain-free currently.

## 2017-04-04 NOTE — Assessment & Plan Note (Addendum)
Status post partially successful closed reduction 5 days ago. Repeat x-rays today, I do suspect we will need to proceed to ORIF.  Displacement and shortening is still too great on new x-rays, referral for ORIF.

## 2017-04-05 ENCOUNTER — Ambulatory Visit (INDEPENDENT_AMBULATORY_CARE_PROVIDER_SITE_OTHER): Payer: BC Managed Care – PPO | Admitting: Podiatry

## 2017-04-05 ENCOUNTER — Encounter: Payer: Self-pay | Admitting: Podiatry

## 2017-04-05 DIAGNOSIS — S92352A Displaced fracture of fifth metatarsal bone, left foot, initial encounter for closed fracture: Secondary | ICD-10-CM

## 2017-04-05 DIAGNOSIS — R600 Localized edema: Secondary | ICD-10-CM | POA: Diagnosis not present

## 2017-04-05 DIAGNOSIS — M79672 Pain in left foot: Secondary | ICD-10-CM | POA: Diagnosis not present

## 2017-04-05 NOTE — Progress Notes (Signed)
SUBJECTIVE: 61 y.o. year old female presents wearing walking boot on left lower limb using a pair of clutches. She was referred by Dr. Darene Lamer for displaced closed compound fracture of 5th metatarsal bone left foot.  Patient stated that she fell off from a deck about a foot height. She was seen at ER and followed by PCP. Her 2nd x-rays were taken a week later, which revealed further distraction of the fracture fragment from the proximal shaft according to radiology report.  Patient is here for pre op.     REVIEW OF SYSTEMS: A comprehensive review of systems was negative except for: HiLeft p surgery 17 years ago, arthroscopic knee surgery left 5 years ago, arthroscopic right shoulder surgery 3 years ago.  OBJECTIVE: DERMATOLOGIC EXAMINATION: No abnormal findings.  VASCULAR EXAMINATION OF LOWER LIMBS: All pedal pulses are palpable with normal pulsation.  Positive of pedal edema with mild ecchymosis over the 5th metatarsal head left. Temperature gradient from tibial crest to dorsum of foot is within normal bilateral.  NEUROLOGIC EXAMINATION OF THE LOWER LIMBS: All epicritic and tactile sensations grossly intact.  MUSCULOSKELETAL EXAMINATION: Positive for pain at affected area of lateral column of left foot upon loading of forefoot.   Patient had radiographic examination done at [place] [facility] on 7/13 and 04/04/17. Noted of medially displaced oblique fracture of 5th metatarsal bone, none articular with loose fragment at proximal fracture site.  Fracture line went distal lateral at the level of metatarsal neck and ran oblique through the shaft ended at medial aspect of the mid shaft of metatarsal.  ASSESSMENT: Injury left foot with pain and swelling with ambulation. Displaced closed fracture, extra articular oblique fracture 5th metatarsal shaft left foot.  PLAN: Reviewed clinical findings and available treatment options. Surgery consent form reviewed for Open Reduction Internal Fixation of the  fractured 5th metatarsal bone left foot.

## 2017-04-05 NOTE — Patient Instructions (Signed)
Seen for injured left foot. Reviewed x-ray findings and surgical options. Surgery consent form reviewed.

## 2017-04-06 ENCOUNTER — Other Ambulatory Visit: Payer: Self-pay | Admitting: Podiatry

## 2017-04-06 MED ORDER — HYDROCODONE-IBUPROFEN 7.5-200 MG PO TABS: 1.0000 | ORAL_TABLET | Freq: Four times a day (QID) | ORAL | 0 refills | Status: DC | PRN

## 2017-04-07 ENCOUNTER — Other Ambulatory Visit: Payer: Self-pay | Admitting: Family Medicine

## 2017-04-07 ENCOUNTER — Encounter: Payer: Self-pay | Admitting: *Deleted

## 2017-04-07 DIAGNOSIS — S92309A Fracture of unspecified metatarsal bone(s), unspecified foot, initial encounter for closed fracture: Secondary | ICD-10-CM | POA: Diagnosis not present

## 2017-04-07 HISTORY — PX: ORIF METATARSAL FRACTURE: SUR942

## 2017-04-11 ENCOUNTER — Ambulatory Visit (INDEPENDENT_AMBULATORY_CARE_PROVIDER_SITE_OTHER): Payer: BC Managed Care – PPO | Admitting: Sports Medicine

## 2017-04-11 DIAGNOSIS — M1712 Unilateral primary osteoarthritis, left knee: Secondary | ICD-10-CM

## 2017-04-11 DIAGNOSIS — S92352D Displaced fracture of fifth metatarsal bone, left foot, subsequent encounter for fracture with routine healing: Secondary | ICD-10-CM

## 2017-04-11 NOTE — Assessment & Plan Note (Signed)
Now post ORIF with podiatry.  Further management per Dr. Caffie Pinto.

## 2017-04-11 NOTE — Assessment & Plan Note (Signed)
Supartz injection #5 of 5 into the left knee, currently pain-free. Return as needed.

## 2017-04-11 NOTE — Progress Notes (Signed)
   Procedure: Real-time Ultrasound Guided Injection of left knee Device: GE Logiq E  Verbal informed consent obtained.  Time-out conducted.  Noted no overlying erythema, induration, or other signs of local infection.  Skin prepped in a sterile fashion.  Local anesthesia: Topical Ethyl chloride.  With sterile technique and under real time ultrasound guidance:  25 mg/2.5 mL of Supartz (sodium hyaluronate) in a prefilled syringe was injected easily into the knee through a 22-gauge needle. Completed without difficulty  Pain immediately resolved suggesting accurate placement of the medication.  Advised to call if fevers/chills, erythema, induration, drainage, or persistent bleeding.  Images permanently stored and available for review in the ultrasound unit.  Impression: Technically successful ultrasound guided injection.  Impression and Recommendations:    Primary osteoarthritis of left knee Supartz injection #5 of 5 into the left knee, currently pain-free. Return as needed.  Closed displaced fracture of fifth metatarsal bone of left foot Now post ORIF with podiatry.  Further management per Dr. Caffie Pinto.

## 2017-04-13 ENCOUNTER — Ambulatory Visit (INDEPENDENT_AMBULATORY_CARE_PROVIDER_SITE_OTHER): Payer: BC Managed Care – PPO | Admitting: Podiatry

## 2017-04-13 ENCOUNTER — Encounter: Payer: Self-pay | Admitting: Podiatry

## 2017-04-13 DIAGNOSIS — Z9889 Other specified postprocedural states: Secondary | ICD-10-CM

## 2017-04-13 NOTE — Patient Instructions (Addendum)
5 days post op since repair surgery on fractured bone on right foot. Doing well with cast. Continue limit ambulation. Ok to bear weight on attached heel. Use crutches and any other devices as needed. Will replace cast in 2 weeks.

## 2017-04-13 NOTE — Progress Notes (Signed)
1 week post op following ORIF left foot for fractured displaced 5th metatarsal bone. Patient is in walking cast and using crutches. Stated that pain and discomfort is minimum.  All toes are normal color and able to wiggle. No abnormal pressure points inside cast. Reviewed how to use crutches effectively with casted foot. Will replace cast in 2 weeks.

## 2017-04-27 ENCOUNTER — Encounter: Payer: Self-pay | Admitting: Podiatry

## 2017-04-27 ENCOUNTER — Ambulatory Visit (INDEPENDENT_AMBULATORY_CARE_PROVIDER_SITE_OTHER): Payer: BC Managed Care – PPO | Admitting: Podiatry

## 2017-04-27 ENCOUNTER — Encounter: Payer: BC Managed Care – PPO | Admitting: Podiatry

## 2017-04-27 DIAGNOSIS — S92352A Displaced fracture of fifth metatarsal bone, left foot, initial encounter for closed fracture: Secondary | ICD-10-CM

## 2017-04-27 NOTE — Progress Notes (Signed)
3 weeks post op following ORIF for fractured 5th metatarsal bone left foot, (04/07/17) Patient is ambulatory with casted left lower limb with attached walking heel. States minimum discomfort with ambulation. Cast removed. Noted of mild forefoot edema. Surgical wound healed well. Post op X-ray show minimum change from the immediate post op x-ray. No movement or change in distal fracture fragment. Internal fixation plate and screws in place. Left surgical site cleansed. Suture removed. Left lower limb placed back in Fiberglass cast with cast boot. Continue current level of activity. Return in 3 weeks.

## 2017-04-27 NOTE — Patient Instructions (Signed)
Normal post op wound healing without complication. Cast replaced and Cast boot placed. Continue minimum ambulation and weight bearing. Return in 3 weeks.

## 2017-05-18 ENCOUNTER — Encounter: Payer: BC Managed Care – PPO | Admitting: Podiatry

## 2017-05-18 ENCOUNTER — Ambulatory Visit (INDEPENDENT_AMBULATORY_CARE_PROVIDER_SITE_OTHER): Payer: BC Managed Care – PPO | Admitting: Podiatry

## 2017-05-18 ENCOUNTER — Encounter: Payer: Self-pay | Admitting: Podiatry

## 2017-05-18 DIAGNOSIS — S92352A Displaced fracture of fifth metatarsal bone, left foot, initial encounter for closed fracture: Secondary | ICD-10-CM | POA: Diagnosis not present

## 2017-05-18 DIAGNOSIS — R600 Localized edema: Secondary | ICD-10-CM

## 2017-05-18 NOTE — Progress Notes (Signed)
Subjective: 6 weeks post op following ORIF for fractured 5th metatarsal bone left foot, (04/07/17) Stated that she had minimum discomfort with ambulation.  Objective: Cast removed.  No edema or erythema noted. Surgical wound healed well.  Post op X-ray show minimum change from the last x-ray done 3 weeks ago. Distal fracture fragment in place without any separation or displacement.  No change in Internal fixation plate and screw place. Loss of bone mass in the affected metatarsal head with increased radio lucency. Similar findings noted on adjacent metatarsal head and phalangeal bases.  Assessment:  Satisfactory progress following ORIF for fractured 5th metatarsal shaft left foot.  Plan: Left lower limb placed back in CAM walker. Patient is to stay in CAM walker for 2 more weeks. Then return to regular tennis shoe with ankle brace. Ankle brace dispensed with instruction. May increase weight bearing and ambulation as tolerated.  Advised to increase Calcium intake. Return if there is any change in progress.

## 2017-05-18 NOTE — Patient Instructions (Signed)
6 weeks post op wound healing normal. Minimum discomfort. May increase ambulation with CAM walker. Return to tennis shoes in 2 weeks as tolerated. Use ankle brace after CAM walker to add stability of the ankle joint. Return if any change in progress.

## 2017-05-24 ENCOUNTER — Ambulatory Visit (INDEPENDENT_AMBULATORY_CARE_PROVIDER_SITE_OTHER): Payer: BC Managed Care – PPO | Admitting: Obstetrics and Gynecology

## 2017-05-24 ENCOUNTER — Telehealth: Payer: Self-pay | Admitting: *Deleted

## 2017-05-24 ENCOUNTER — Encounter: Payer: Self-pay | Admitting: Obstetrics and Gynecology

## 2017-05-24 VITALS — BP 108/70 | HR 84 | Resp 16 | Ht 61.0 in | Wt 150.0 lb

## 2017-05-24 DIAGNOSIS — M8580 Other specified disorders of bone density and structure, unspecified site: Secondary | ICD-10-CM

## 2017-05-24 DIAGNOSIS — Z01419 Encounter for gynecological examination (general) (routine) without abnormal findings: Secondary | ICD-10-CM

## 2017-05-24 NOTE — Patient Instructions (Signed)

## 2017-05-24 NOTE — Telephone Encounter (Signed)
05/24/17 Dr.Sheard, Patient called this afternoon and wanted me to tell you her surgery foot and toes are swollen and hurt. She wants to know can she apply ice or does she need a follow up appointment. She had her cast removed last week.

## 2017-05-24 NOTE — Progress Notes (Signed)
61 y.o. G0P0 MarriedCaucasianF here for annual exam.   Husband had a CABG in June, doing well. She feel off her deck and broke a metatarsal in her left foot. Just got out of her cast and is in a boot. Stressful summer. Overall doing well.  No vaginal bleeding. No bowel or bladder c/o.     Patient's last menstrual period was 12/18/2013.          Sexually active: No.  -- patient did not wish to discuss The current method of family planning is post menopausal status.    Exercising: No.  The patient does not participate in regular exercise at present. Smoker:  no  Health Maintenance: Pap:  05/14/15 Neg. HR HPV:Neg  History of abnormal Pap:  Yes, ASCUS, CIN I, several years ago. 1998 MMG:  04/21/16 BIRADS1:neg  Colonoscopy:  10/21/14 f/u 5 years  BMD:   01/08/15 Osteopenia  FRAX: Based on the [place] model, the 10 year probability of a major osteoporotic fracture is 8%. The 10 year probability of a hip fracture is 0.8%. TDaP:  01/24/09   reports that she has never smoked. She has never used smokeless tobacco. She reports that she drinks about 0.6 oz of alcohol per week . She reports that she does not use drugs. Retired 5 years ago, she was a Pharmacist, hospital.   Past Medical History:  Diagnosis Date  . Abnormal Pap smear, atypical squamous cells of undetermined sign (ASC-US) 1998   treated with cryo, CIN I  . ADD (attention deficit disorder) 02-23-12   easily distracted.  . Arthritis 02-23-12   Rt. hip and lt. arm  . Kidney calculi 07/2015  . Kidney stone   . PONV (postoperative nausea and vomiting) 02-23-12   always has PONV  . Post-nasal drainage 02-23-12   frequent a problem,causes a cough and mucus buildup in throat  . Urethral polyp 1998   Dr. Hartley Barefoot  . Varicose veins   . Venous insufficiency    chronic-denies any problems    Past Surgical History:  Procedure Laterality Date  . blood clot removed Left 01/2012   blood clot removed from left hand   . BREAST SURGERY   02-23-12   Rt. needle biopsy, recent -negative  path showed fibroadenoma  . COLONOSCOPY  08/2003   repeat 5 yrs.  . COLONOSCOPY W/ BIOPSIES  12/2008   1 polyp recheck 5 yrs  . EXCISION/RELEASE BURSA HIP  02/29/12  . HAND SURGERY  02-23-12   "blood clot" excised palm left hand 01-18-12  . KNEE ARTHROSCOPY W/ MENISCAL REPAIR  10/2012  . KNEE ARTHROSCOPY WITH EXCISION BAKER'S CYST Left 10/2012   meniscal repair  . ORIF METATARSAL FRACTURE Left 04/07/2017   Left 5th Metatarsal  . SHOULDER SURGERY Right 02/2015   Dr. Lennette Bihari Supple - [facility]  . 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 AMBULATORY NON FORMULARY MEDICATION Rolling Knee Scooter, please take Rx to medical supply store. 1 each 0  . cholecalciferol (VITAMIN D) 1000 units tablet daily.    . fish oil-omega-3 fatty acids 1000 MG capsule Take 4 g by mouth daily.     Marland Kitchen FLUoxetine (PROZAC) 20 MG capsule TAKE 1 CAPSULE (20 MG TOTAL) BY MOUTH DAILY. 90 capsule 1  . meclizine (ANTIVERT) 25 MG tablet Take 1 tablet (25 mg total) by mouth 3 (three) times daily as  needed for dizziness or nausea. 30 tablet 3  . Multiple Vitamin (MULTIVITAMIN) tablet Take 1 tablet by mouth daily.     Marland Kitchen HYDROcodone-acetaminophen (NORCO/VICODIN) 5-325 MG tablet Take 1 tablet by mouth every 6 (six) hours as needed for moderate pain. (Patient not taking: Reported on 05/24/2017) 30 tablet 0  . OVER THE COUNTER MEDICATION Take 2 tablets by mouth daily with breakfast. Vitamin D gummie     No current facility-administered medications for this visit.     Family History  Problem Relation Age of Onset  . Breast cancer Mother 43       breast  . Hyperlipidemia Mother   . Brain cancer Father 11       brain  . Osteoporosis Maternal Grandmother   . Drug abuse Brother     Review of Systems  Constitutional: Negative.   HENT: Negative.   Eyes: Negative.    Respiratory: Negative.   Cardiovascular: Negative.   Gastrointestinal: Negative.   Endocrine: Negative.   Genitourinary: Negative.   Musculoskeletal: Negative.   Skin: Negative.   Allergic/Immunologic: Negative.   Neurological: Negative.   Hematological: Negative.   Psychiatric/Behavioral: Negative.     Exam:   BP 108/70 (BP Location: Right Arm, Patient Position: Sitting, Cuff Size: Normal)   Pulse 84   Resp 16   Ht 5\' 1"  (1.549 m)   Wt 150 lb (68 kg)   LMP 12/18/2013   BMI 28.34 kg/m   Weight change: @WEIGHTCHANGE @ Height:   Height: 5\' 1"  (154.9 cm)  Ht Readings from Last 3 Encounters:  05/24/17 5\' 1"  (1.549 m)  03/30/17 5\' 1"  (1.549 m)  10/21/16 5\' 1"  (1.549 m)    General appearance: alert, cooperative and appears stated age Head: Normocephalic, without obvious abnormality, atraumatic Neck: no adenopathy, supple, symmetrical, trachea midline and thyroid normal to inspection and palpation Lungs: clear to auscultation bilaterally Cardiovascular: regular rate and rhythm Breasts: no masses, no dimpling, slightly tender BUQ's of both breasts (+caffeine) Abdomen: soft, non-tender; bowel sounds normal; no masses,  no organomegaly Extremities: extremities normal, atraumatic, no cyanosis or edema Skin: Skin color, texture, turgor normal. No rashes or lesions Lymph nodes: Cervical, supraclavicular, and axillary nodes normal. No abnormal inguinal nodes palpated Neurologic: Grossly normal   Pelvic: External genitalia:  no lesions              Urethra:  normal appearing urethra with no masses, tenderness or lesions              Bartholins and Skenes: normal                 Vagina: normal appearing vagina with normal color and discharge, no lesions              Cervix: no lesions               Bimanual Exam:  Uterus:  normal size, contour, position, consistency, mobility, non-tender              Adnexa: no mass, fullness, tenderness               Rectovaginal: Confirms                Anus:  normal sphincter tone, no lesions  Chaperone was present for exam.  A:  Well Woman with normal exam  Osteopenia  H/O vit d def, on vit d  P:   Pap next year  DEXA  Discussed breast self exam  Discussed calcium and  vit D intake  Mammogram  Labs including vit d with primary

## 2017-06-01 ENCOUNTER — Other Ambulatory Visit: Payer: Self-pay | Admitting: Obstetrics and Gynecology

## 2017-06-01 DIAGNOSIS — Z1231 Encounter for screening mammogram for malignant neoplasm of breast: Secondary | ICD-10-CM

## 2017-06-13 ENCOUNTER — Other Ambulatory Visit: Payer: Self-pay | Admitting: Obstetrics and Gynecology

## 2017-06-13 DIAGNOSIS — M8580 Other specified disorders of bone density and structure, unspecified site: Secondary | ICD-10-CM

## 2017-06-14 ENCOUNTER — Other Ambulatory Visit (HOSPITAL_BASED_OUTPATIENT_CLINIC_OR_DEPARTMENT_OTHER): Payer: Self-pay

## 2017-06-14 ENCOUNTER — Ambulatory Visit (INDEPENDENT_AMBULATORY_CARE_PROVIDER_SITE_OTHER): Payer: BC Managed Care – PPO

## 2017-06-14 DIAGNOSIS — M8580 Other specified disorders of bone density and structure, unspecified site: Secondary | ICD-10-CM

## 2017-06-14 DIAGNOSIS — M85859 Other specified disorders of bone density and structure, unspecified thigh: Secondary | ICD-10-CM | POA: Diagnosis not present

## 2017-06-14 DIAGNOSIS — Z1231 Encounter for screening mammogram for malignant neoplasm of breast: Secondary | ICD-10-CM | POA: Diagnosis not present

## 2017-06-14 DIAGNOSIS — M85832 Other specified disorders of bone density and structure, left forearm: Secondary | ICD-10-CM | POA: Diagnosis not present

## 2017-07-18 ENCOUNTER — Ambulatory Visit (INDEPENDENT_AMBULATORY_CARE_PROVIDER_SITE_OTHER): Payer: BC Managed Care – PPO | Admitting: Sports Medicine

## 2017-07-18 ENCOUNTER — Encounter: Payer: Self-pay | Admitting: Sports Medicine

## 2017-07-18 ENCOUNTER — Ambulatory Visit (INDEPENDENT_AMBULATORY_CARE_PROVIDER_SITE_OTHER): Payer: BC Managed Care – PPO

## 2017-07-18 ENCOUNTER — Ambulatory Visit: Payer: BC Managed Care – PPO

## 2017-07-18 DIAGNOSIS — M51369 Other intervertebral disc degeneration, lumbar region without mention of lumbar back pain or lower extremity pain: Secondary | ICD-10-CM

## 2017-07-18 DIAGNOSIS — Z23 Encounter for immunization: Secondary | ICD-10-CM

## 2017-07-18 DIAGNOSIS — M5136 Other intervertebral disc degeneration, lumbar region: Secondary | ICD-10-CM

## 2017-07-18 DIAGNOSIS — M4854XA Collapsed vertebra, not elsewhere classified, thoracic region, initial encounter for fracture: Secondary | ICD-10-CM

## 2017-07-18 MED ORDER — KETOROLAC TROMETHAMINE 30 MG/ML IJ SOLN
30.0000 mg | Freq: Once | INTRAMUSCULAR | Status: AC
  Administered 2017-07-18: 30 mg via INTRAMUSCULAR

## 2017-07-18 MED ORDER — PREDNISONE 50 MG PO TABS: ORAL_TABLET | ORAL | 0 refills | Status: DC

## 2017-07-18 NOTE — Progress Notes (Signed)
   Subjective:    I'm seeing this patient as a consultation for: Dr. Beatrice Lecher  CC: Back injury  HPI: This is a pleasant 61 year old female, she has known lumbar degenerative disc disease with moderate L2 on L3 retrolisthesis, she was recently moving some furniture, now has increase in back pain, worse with sitting, flexion, Valsalva with left-sided L3 radicular symptoms, no bowel or bladder dysfunction, saddle numbness, no constitutional symptoms, no trauma.  Past medical history, Surgical history, Family history not pertinant except as noted below, Social history, Allergies, and medications have been entered into the medical record, reviewed, and no changes needed.   Review of Systems: No headache, visual changes, nausea, vomiting, diarrhea, constipation, dizziness, abdominal pain, skin rash, fevers, chills, night sweats, weight loss, swollen lymph nodes, body aches, joint swelling, muscle aches, chest pain, shortness of breath, mood changes, visual or auditory hallucinations.   Objective:   General: Well Developed, well nourished, and in no acute distress.  Neuro:  Extra-ocular muscles intact, able to move all 4 extremities, sensation grossly intact.  Deep tendon reflexes tested were normal. Psych: Alert and oriented, mood congruent with affect. ENT:  Ears and nose appear unremarkable.  Hearing grossly normal. Neck: Unremarkable overall appearance, trachea midline.  No visible thyroid enlargement. Eyes: Conjunctivae and lids appear unremarkable.  Pupils equal and round. Skin: Warm and dry, no rashes noted.  Cardiovascular: Pulses palpable, no extremity edema. Back Exam:  Inspection: Unremarkable  Motion: Flexion 45 deg, Extension 45 deg, Side Bending to 45 deg bilaterally,  Rotation to 45 deg bilaterally  SLR laying: Negative  XSLR laying: Negative  Palpable tenderness: Tender to palpation across the entirety of the lower lumbar spine. FABER: negative. Sensory change: Gross  sensation intact to all lumbar and sacral dermatomes.  Reflexes: 2+ at both patellar tendons, 2+ at achilles tendons, Babinski's downgoing.  Strength at foot  Plantar-flexion: 5/5 Dorsi-flexion: 5/5 Eversion: 5/5 Inversion: 5/5  Leg strength  Quad: 5/5 Hamstring: 5/5 Hip flexor: 5/5 Hip abductors: 5/5  Gait unremarkable.  Toradol 30 intramuscular given today.  Impression and Recommendations:   This case required medical decision making of moderate complexity.  DDD (degenerative disc disease), lumbar With L2 and L3 stable retrolisthesis in the past. Increasing pain after moving some furniture, Toradol 30 intramuscular, prednisone, repeat x-rays, formal physical therapy. Return to see me in 4 weeks, the MR for interventional planning if no better. She is having some left-sided radiculitis in an L3 distribution.  ___________________________________________ Gwen Her. Dianah Field, M.D., ABFM., CAQSM. Primary Care and [place] Instructor of [employer] [facility] [employer]

## 2017-07-18 NOTE — Assessment & Plan Note (Signed)
With L2 and L3 stable retrolisthesis in the past. Increasing pain after moving some furniture, Toradol 30 intramuscular, prednisone, repeat x-rays, formal physical therapy. Return to see me in 4 weeks, the MR for interventional planning if no better. She is having some left-sided radiculitis in an L3 distribution.

## 2017-07-20 ENCOUNTER — Ambulatory Visit: Payer: Self-pay | Admitting: Rehabilitative and Restorative Service Providers"

## 2017-07-27 ENCOUNTER — Encounter: Payer: Self-pay | Admitting: Physical Therapy

## 2017-07-27 ENCOUNTER — Ambulatory Visit (INDEPENDENT_AMBULATORY_CARE_PROVIDER_SITE_OTHER): Payer: BC Managed Care – PPO | Admitting: Physical Therapy

## 2017-07-27 DIAGNOSIS — M545 Low back pain: Secondary | ICD-10-CM

## 2017-07-27 DIAGNOSIS — G8929 Other chronic pain: Secondary | ICD-10-CM

## 2017-07-27 DIAGNOSIS — M6281 Muscle weakness (generalized): Secondary | ICD-10-CM

## 2017-07-27 DIAGNOSIS — R293 Abnormal posture: Secondary | ICD-10-CM | POA: Diagnosis not present

## 2017-07-27 NOTE — Therapy (Signed)
[facility] [place], [place], [address] Phone: [phone]   Fax:  [phone]  Physical Therapy Evaluation  Patient Details  Name: Wanda Gonzales MRN: [id number] Date of Birth: 1956/01/16 Referring Provider: Dr Dianah Field   Encounter Date: 07/27/2017  PT End of Session - 07/27/17 1723    Visit Number  1    Number of Visits  8    Date for PT Re-Evaluation  08/24/17    PT Start Time  6629    PT Stop Time  1721    PT Time Calculation (min)  60 min    Activity Tolerance  Patient tolerated treatment well       Past Medical History:  Diagnosis Date  . Abnormal Pap smear, atypical squamous cells of undetermined sign (ASC-US) 1998   treated with cryo, CIN I  . ADD (attention deficit disorder) 02-23-12   easily distracted.  . Arthritis 02-23-12   Rt. hip and lt. arm  . Kidney calculi 07/2015  . Kidney stone   . PONV (postoperative nausea and vomiting) 02-23-12   always has PONV  . Post-nasal drainage 02-23-12   frequent a problem,causes a cough and mucus buildup in throat  . Urethral polyp 1998   Dr. Hartley Barefoot  . Varicose veins   . Venous insufficiency    chronic-denies any problems    Past Surgical History:  Procedure Laterality Date  . blood clot removed Left 01/2012   blood clot removed from left hand   . BREAST BIOPSY Right   . BREAST SURGERY  02-23-12   Rt. needle biopsy, recent -negative  path showed fibroadenoma  . COLONOSCOPY  08/2003   repeat 5 yrs.  . COLONOSCOPY W/ BIOPSIES  12/2008   1 polyp recheck 5 yrs  . EXCISION/RELEASE BURSA HIP  02/29/12  . HAND SURGERY  02-23-12   "blood clot" excised palm left hand 01-18-12  . KNEE ARTHROSCOPY W/ MENISCAL REPAIR  10/2012  . KNEE ARTHROSCOPY WITH EXCISION BAKER'S CYST Left 10/2012   meniscal repair  . ORIF METATARSAL FRACTURE Left 04/07/2017   Left 5th Metatarsal  . SHOULDER SURGERY Right 02/2015   Dr. Lennette Bihari Supple - [facility]  . 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   Subjective Assessment - 07/27/17 1616    Subjective  Pt reports her back flared up months ago, over the summer her husband had major surgery, the in July she fell and broke her Lt ankle and was casted for 6 wks. She is falling alot lately.  She as been wearing her back brace because it feels good. last week she was moving furniture and the back hurt so bad she could barely move, had an injection and was on prednisone.  Currently waking 2-3 times at night due to back pain.     Pertinent History  Lt THA, Lt OA knee  severe, osteopenia,  getting a new firm mattress soon and uses lumbar support.     How long can you walk comfortably?  ok for getting to the store and short walks with the dog.     Patient Stated Goals  get back to exercise, ( has access to a pool) , pick up her 10# dog and lose weight.     Currently in Pain?  Yes    Pain Score  7     Pain Location  Back    Pain Orientation  Left;Right Lt > Rt     Pain Descriptors / Indicators  Aching;Throbbing;Tightness    Pain Onset  1 to 4 weeks ago    Pain Frequency  Constant    Aggravating Factors   first thing in AM, bending over to pick up objects, getting up from the floor    Pain Relieving Factors  heat,          [facility] PT Assessment - 07/27/17 0001      Assessment   Medical Diagnosis  Lumbar DDD    Referring Provider  Dr Dianah Field    Onset Date/Surgical Date  07/13/17    Hand Dominance  Right    Next MD Visit  08/10/17 may change to after therapy    Prior Therapy  years ago      Precautions   Precautions  None      Balance Screen   Has the patient fallen in the past 6 months  Yes    How many times?  multiple    Has the patient had a decrease in activity level because of a fear of falling?   Yes    Is the patient reluctant to leave their home because of a fear of falling?   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, watch TV, sedentary      Observation/Other Assessments   Focus on Therapeutic Outcomes (FOTO)   58% limited      Posture/Postural Control   Posture/Postural Control  Postural limitations    Postural Limitations  Forward head;Rounded Shoulders extra abdominal girth      ROM / Strength   AROM / PROM / Strength  AROM;Strength      AROM   AROM Assessment Site  Lumbar    Lumbar Flexion  to floor with slight Lt rotations    Lumbar Extension  WNL    Lumbar - Right Side Bend  WNL pulling Lt side low back    Lumbar - Left Side Bend  WNL    Lumbar - Right Rotation  WNL    Lumbar - Left Rotation  WNL      Strength   Strength Assessment Site  Hip;Knee;Ankle;Lumbar    Right/Left Hip  Right;Left    Right Hip Flexion  5/5    Right Hip Extension  5/5    Right Hip ABduction  5/5    Left Hip Flexion  4+/5    Left Hip Extension  5/5    Left Hip ABduction  4-/5    Right/Left Knee  -- Rt WNL, Lt 4+/5 with pain    Right/Left Ankle  -- WNL    Lumbar Flexion  -- TA fair (+)     Lumbar Extension  -- multifidi poor.       Palpation   Spinal mobility  hypomobile L3-5 and very tender with grade II CPA and bilat UPA mobs.     Palpation comment  hypersensitive to palpation in and around Lt hip and gluts. Tender in Rt gluts and ITB      Ambulation/Gait   Ambulation Distance (Feet)  -- observed in the clinic  Assistive device  None    Gait Pattern  -- Lt LE adducted and internally rotated              Objective measurements completed on examination: See above findings.      [facility] Adult PT Treatment/Exercise - 07/27/17 0001      Exercises   Exercises  Lumbar      Lumbar Exercises: Stretches   Double Knee to Chest Stretch  20 seconds      Lumbar  Exercises: Sidelying   Hip Abduction  -- 3x10 each side      Lumbar Exercises: Prone   Other Prone Lumbar Exercises  10x10sec pelvic press, 10 reps press with hip ext each side      Modalities   Modalities  Moist Heat;Electrical Stimulation      Moist Heat Therapy   Number Minutes Moist Heat  15 Minutes    Moist Heat Location  Lumbar Spine      Electrical Stimulation   Electrical Stimulation Location  lumbar and buttocks    Electrical Stimulation Action  modulation    Electrical Stimulation Parameters  to tolerance    Electrical Stimulation Goals  Pain;Tone             PT Education - 07/27/17 1709    Education provided  Yes    Education Details  HEP, home TENS, DN    Person(s) Educated  Patient    Methods  Explanation;Demonstration;Handout    Comprehension  Verbalized understanding;Returned demonstration          PT Long Term Goals - 07/27/17 1731      PT LONG TERM GOAL #1   Title  I with advanced HEP( 08/24/17)     Time  4    Period  Weeks    Status  New      PT LONG TERM GOAL #2   Title  Report pain decrease to allow full lumbar ROM ( 08/24/17)     Time  4    Period  Weeks    Status  New      PT LONG TERM GOAL #3   Title  demo bilat hip strength =/> 5-/5 to allow for normalized gait in the Lt LE. ( 08/24/17)    Time  4    Period  Weeks    Status  New      PT LONG TERM GOAL #4   Title  improve FOTO =/< 44% limited ( 08/24/17)     Time  4    Period  Weeks    Status  New             Plan - 07/27/17 1724    Clinical Impression Statement  61 yo female with ~ 2 wk flare up of low back pain after moving furniture.  She has a lot of tightness and tenderness in her lumbar paraspinals, bilateral gluts, TFL and ITB.  She is weak through her core and hips. This causes gait abnormalities with the Lt LE adducting and IR.  DGI will be completed at next visit to look at her fall risk.      Clinical Presentation  Stable    Clinical Decision Making  Low     Rehab Potential  Excellent    PT Frequency  2x / week    PT Duration  4 weeks    PT Treatment/Interventions  Gait training;Neuromuscular re-education;Dry needling;Manual techniques;Moist Heat;Ultrasound;Therapeutic activities;Patient/family education;Taping;Therapeutic exercise;Cryotherapy;Electrical Stimulation;Balance training    PT Next Visit  Plan  complete DGI, manual work low back possible DN.     Consulted and Agree with Plan of Care  Patient       Patient will benefit from skilled therapeutic intervention in order to improve the following deficits and impairments:  Abnormal gait, Pain, Postural dysfunction, Increased muscle spasms, Decreased strength  Visit Diagnosis: Chronic bilateral low back pain without sciatica - Plan: PT plan of care cert/re-cert  Muscle weakness (generalized) - Plan: PT plan of care cert/re-cert  Abnormal posture - Plan: PT plan of care cert/re-cert     Problem List Patient Active Problem List   Diagnosis Date Noted  . Closed displaced fracture of fifth metatarsal bone of left foot 03/31/2017  . Primary osteoarthritis of left knee 08/25/2016  . Baker's cyst of knee, left 08/25/2016  . Benign paroxysmal positional vertigo 08/25/2016  . Back pain 12/16/2015  . Cervical strain 11/21/2014  . Right shoulder pain 05/15/2014  . DDD (degenerative disc disease), lumbar 02/17/2014  . Unspecified vitamin D deficiency 03/18/2013  . Trochanteric bursitis of right hip 02/29/2012  . INSOMNIA 04/13/2010  . HYPERLIPIDEMIA 09/17/2007  . CERVICAL POLYP 09/17/2007  . Attention deficit disorder 09/11/2007  . UNSPECIFIED VENOUS INSUFFICIENCY 08/29/2007    Jeral Pinch PT  07/27/2017, 5:36 PM  [facility] [place], [place], [address] Phone: [phone]   Fax:  [phone]  Name: Wanda Gonzales MRN: [id number] Date of Birth: 05-17-1956

## 2017-07-27 NOTE — Patient Instructions (Addendum)
Pelvic Press    Place hands under belly between navel and pubic bone, palms up. Feel pressure on hands. Increase pressure on hands by pressing pelvis down. This is NOT a pelvic tilt. Hold _5__ seconds. Relax. Repeat _10__ times. Once a day.    Leg Lift: One-Leg    Press pelvis down. Keep knee straight; lengthen and lift one leg (from waist). Do not twist body. Keep other leg down. Hold _1__ seconds. Relax. Repeat 10 time. Repeat with other leg. Once a day  Strengthening: Hip Abduction (Side-Lying)    Tighten muscles on front of left thigh, then lift leg up and slightly back from surface, keeping knee locked. Keep belly drawn in and tight. Repeat __10__ times per set. Do _3___ sets per session. Do __1__ sessions per day.   Knee-to-Chest Stretch: Bilateral    With hands behind knees, pull both knees in to chest until a comfortable stretch is felt in lower back and buttocks. Keep back relaxed. Hold __20-30__ seconds. Repeat __2__ times per set. Do __1__ sets per session. Do __1-2__ sessions per day.  Trigger Point Dry Needling  . What is Trigger Point Dry Needling (DN)? o DN is a physical therapy technique used to treat muscle pain and dysfunction. Specifically, DN helps deactivate muscle trigger points (muscle knots).  o A thin filiform needle is used to penetrate the skin and stimulate the underlying trigger point. The goal is for a local twitch response (LTR) to occur and for the trigger point to relax. No medication of any kind is injected during the procedure.   . What Does Trigger Point Dry Needling Feel Like?  o The procedure feels different for each individual patient. Some patients report that they do not actually feel the needle enter the skin and overall the process is not painful. Very mild bleeding may occur. However, many patients feel a deep cramping in the muscle in which the needle was inserted. This is the local twitch response.   Marland Kitchen How Will I feel after the  treatment? o Soreness is normal, and the onset of soreness may not occur for a few hours. Typically this soreness does not last longer than two days.  o Bruising is uncommon, however; ice can be used to decrease any possible bruising.  o In rare cases feeling tired or nauseous after the treatment is normal. In addition, your symptoms may get worse before they get better, this period will typically not last longer than 24 hours.   . What Can I do After My Treatment? o Increase your hydration by drinking more water for the next 24 hours. o You may place ice or heat on the areas treated that have become sore, however, do not use heat on inflamed or bruised areas. Heat often brings more relief post needling. o You can continue your regular activities, but vigorous activity is not recommended initially after the treatment for 24 hours. o DN is best combined with other physical therapy such as strengthening, stretching, and other therapies.    TENS UNIT: This is helpful for muscle pain and spasm.   Search and Purchase a TENS 7000 2nd edition at www.tenspros.com. It should be less than $30.     TENS unit instructions: Do not shower or bathe with the unit on Turn the unit off before removing electrodes or batteries If the electrodes lose stickiness add a drop of water to the electrodes after they are disconnected from the unit and place on plastic sheet. If you continued  to have difficulty, call the TENS unit company to purchase more electrodes. Do not apply lotion on the skin area prior to use. Make sure the skin is clean and dry as this will help prolong the life of the electrodes. After use, always check skin for unusual red areas, rash or other skin difficulties. If there are any skin problems, does not apply electrodes to the same area. Never remove the electrodes from the unit by pulling the wires. Do not use the TENS unit or electrodes other than as directed. Do not change electrode  placement without consultating your therapist or physician. Keep 2 fingers with between each electrode. Wear time ratio is 2:1, on to off times.    For example on for 30 minutes off for 15 minutes and then on for 30 minutes off for 15 minutes

## 2017-07-31 ENCOUNTER — Ambulatory Visit: Payer: BC Managed Care – PPO | Admitting: Physical Therapy

## 2017-07-31 ENCOUNTER — Encounter: Payer: Self-pay | Admitting: Physical Therapy

## 2017-07-31 DIAGNOSIS — M545 Low back pain: Secondary | ICD-10-CM

## 2017-07-31 DIAGNOSIS — M6281 Muscle weakness (generalized): Secondary | ICD-10-CM

## 2017-07-31 DIAGNOSIS — R293 Abnormal posture: Secondary | ICD-10-CM | POA: Diagnosis not present

## 2017-07-31 DIAGNOSIS — G8929 Other chronic pain: Secondary | ICD-10-CM

## 2017-07-31 NOTE — Therapy (Signed)
Wanda [facility] [place], [place], [address] Phone: [phone]   Fax:  [phone]  Physical Therapy Treatment  Patient Details  Name: Wanda [place] MRN: [id number] Date of Birth: [birth year] Referring Provider: Dr. Dianah Field   Encounter Date: 07/31/2017  PT End of Session - 07/31/17 0852    Visit Number  2    Number of Visits  8    Date for PT Re-Evaluation  08/24/17    PT Start Time  0850    PT Stop Time  0944    PT Time Calculation (min)  54 min    Activity Tolerance  Patient tolerated treatment well    Behavior During Therapy  [facility] for tasks assessed/performed       Past Medical History:  Diagnosis Date  . Abnormal Pap smear, atypical squamous cells of undetermined sign (ASC-US) 1998   treated with cryo, CIN I  . ADD (attention deficit disorder) 02-23-12   easily distracted.  . Arthritis 02-23-12   Rt. hip and lt. arm  . Kidney calculi 07/2015  . Kidney stone   . PONV (postoperative nausea and vomiting) 02-23-12   always has PONV  . Post-nasal drainage 02-23-12   frequent a problem,causes a cough and mucus buildup in throat  . Urethral polyp 1998   Dr. Hartley Barefoot  . Varicose veins   . Venous insufficiency    chronic-denies any problems    Past Surgical History:  Procedure Laterality Date  . blood clot removed Left 01/2012   blood clot removed from left hand   . BREAST BIOPSY Right   . BREAST SURGERY  02-23-12   Rt. needle biopsy, recent -negative  path showed fibroadenoma  . COLONOSCOPY  08/2003   repeat 5 yrs.  . COLONOSCOPY W/ BIOPSIES  12/2008   1 polyp recheck 5 yrs  . EXCISION/RELEASE BURSA HIP  02/29/12  . HAND SURGERY  02-23-12   "blood clot" excised palm left hand 01-18-12  . KNEE ARTHROSCOPY W/ MENISCAL REPAIR  10/2012  . KNEE ARTHROSCOPY WITH EXCISION BAKER'S CYST Left 10/2012   meniscal repair  . ORIF METATARSAL FRACTURE Left 04/07/2017   Left 5th Metatarsal  . SHOULDER SURGERY Right 02/2015    Dr. Lennette Bihari Supple - [facility]  . 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  Subjective Assessment - 07/31/17 0852    Subjective  Wanda [place] reports she was unable to sleep Saturday, so she slept in recliner with heat and this helped. She has done HEP, but it has made her back/stomach feel worse.  She hasn't had any falls since last visit.  She has stopped going up stairs due to fear of falling.    Patient Stated Goals  get back to exercise, ( has access to a pool) , pick up her 10# dog and lose weight.     Currently in Pain?  Yes    Pain Score  6     Pain Location  Back    Pain Orientation  Lower Lt>Rt    Pain Descriptors / Indicators  Tightness;Throbbing;Aching    Aggravating Factors   sneezing, bending over to pick up objects    Pain Relieving Factors  heat          [facility] PT Assessment - 07/31/17 0001      Assessment   Medical Diagnosis  Lumbar DDD  Referring Provider  Dr. Dianah Field    Onset Date/Surgical Date  07/13/17    Hand Dominance  Right    Next MD Visit  08/10/17      Balance   Balance Assessed  Yes      Standardized Balance Assessment   Standardized Balance Assessment  Dynamic Gait Index      Dynamic Gait Index   Level Surface  Normal    Change in Gait Speed  Normal    Gait with Horizontal Head Turns  Normal    Gait with Vertical Head Turns  Mild Impairment    Gait and Pivot Turn  Moderate Impairment    Step Over Obstacle  Mild Impairment    Step Around Obstacles  Mild Impairment    Steps  Moderate Impairment LOB without UE support, step to pattern     Total Score  17       [facility] Adult PT Treatment/Exercise - 07/31/17 0001      Lumbar Exercises: Stretches   Passive Hamstring Stretch  2 reps;30 seconds supine with strap    Double Knee to Chest Stretch  20 seconds;2 reps    Piriformis Stretch  2 reps;30 seconds      Lumbar Exercises: Aerobic   Stationary  Bike  NuStep L4: 5 min      Lumbar Exercises: Supine   Ab Set  10 reps 5 sec    Bent Knee Raise  10 reps with ab set      Lumbar Exercises: Prone   Other Prone Lumbar Exercises  10x10sec pelvic press, 10 reps press with unilteral knee flexion      Moist Heat Therapy   Number Minutes Moist Heat  15 Minutes    Moist Heat Location  Lumbar Spine      Electrical Stimulation   Electrical Stimulation Location  bilat lower lumbar and buttocks    Electrical Stimulation Action  IFC    Electrical Stimulation Parameters  to tolerance     Electrical Stimulation Goals  Pain      Pt taught to engage abdominals with transitional motions (supine to/from sit, sit to/from stand); pt returned demo 1 x with each movement.        PT Education - 07/31/17 0930    Education provided  Yes    Education Details  HEP    Person(s) Educated  Patient    Methods  Explanation;Handout;Verbal cues;Tactile cues    Comprehension  Verbalized understanding;Returned demonstration          PT Long Term Goals - 07/31/17 1300      PT LONG TERM GOAL #1   Title  I with advanced HEP( 08/24/17)     Time  4    Period  Weeks    Status  On-going      PT LONG TERM GOAL #2   Title  Report pain decrease to allow full lumbar ROM ( 08/24/17)     Time  4    Status  On-going      PT LONG TERM GOAL #3   Title  demo bilat hip strength =/> 5-/5 to allow for normalized gait in the Lt LE. ( 08/24/17)    Time  4    Period  Weeks    Status  On-going      PT LONG TERM GOAL #4   Title  improve FOTO =/< 44% limited ( 08/24/17)     Time  4    Period  Weeks  Status  On-going            Plan - 07/31/17 1251    Clinical Impression Statement  Pt had difficulty tolerating NuStep and pelvic press with knee flexion due to increased pain/ crepitus in Lt knee.  Pelvic presses and TA contractions reportedly improved her pain.  She scored 17 on DGI; scores less than 19 are indicative of increased fall risk.   She specifically  had difficulty clearing foot over obstacle and steps.  No new goals met; only 2nd visit.     Rehab Potential  Excellent    PT Frequency  2x / week    PT Duration  4 weeks    PT Treatment/Interventions  Gait training;Neuromuscular re-education;Dry needling;Manual techniques;Moist Heat;Ultrasound;Therapeutic activities;Patient/family education;Taping;Therapeutic exercise;Cryotherapy;Electrical Stimulation;Balance training    PT Next Visit Plan  manual work to LB; continue spinal stabilization and balance exercises.  Possible DN   Consulted and Agree with Plan of Care  Patient       Patient will benefit from skilled therapeutic intervention in order to improve the following deficits and impairments:  Abnormal gait, Pain, Postural dysfunction, Increased muscle spasms, Decreased strength  Visit Diagnosis: Chronic bilateral low back pain without sciatica  Muscle weakness (generalized)  Abnormal posture     Problem List Patient Active Problem List   Diagnosis Date Noted  . Closed displaced fracture of fifth metatarsal bone of left foot 03/31/2017  . Primary osteoarthritis of left knee 08/25/2016  . Baker's cyst of knee, left 08/25/2016  . Benign paroxysmal positional vertigo 08/25/2016  . Back pain 12/16/2015  . Cervical strain 11/21/2014  . Right shoulder pain 05/15/2014  . DDD (degenerative disc disease), lumbar 02/17/2014  . Unspecified vitamin D deficiency 03/18/2013  . Trochanteric bursitis of right hip 02/29/2012  . INSOMNIA 04/13/2010  . HYPERLIPIDEMIA 09/17/2007  . CERVICAL POLYP 09/17/2007  . Attention deficit disorder 09/11/2007  . UNSPECIFIED VENOUS INSUFFICIENCY 08/29/2007   Kerin Perna, PTA 07/31/17 1:01 PM  [facility] [place], [place], [address] Phone: [phone]   Fax:  [phone]  Name: KIONI STAHL MRN: [id number] Date of Birth: [birth year]

## 2017-07-31 NOTE — Patient Instructions (Signed)
  Abdominal Bracing With Pelvic Floor (Hook-Lying)   With neutral spine, tighten pelvic floor and abdominals. Hold 10 seconds, count out loud. Repeat __10_ times. Do _several__ times a day. Can be performed in sitting, standing. .  Knee to Chest: Transverse Plane Stability   Bring one knee up, then return. Be sure pelvis does not roll side to side. Keep pelvis still. Lift knee __10_ times each leg. Restabilize pelvis. Repeat with other leg. Do _1-2__ sets, _1-2__ times per day. KNEE: Flexion - Prone   Hold pelvic press. Bend knee, then return the foot down. Repeat on opposite leg. Do not raise hips. _10__ reps per set. When this is mastered, pull both heels up at same time, x 10 reps.  Once a day  Hamstring Step 1    Straighten knee with foot in strap. Keep other knee bent. Hold _30__ seconds. Relax knee by returning foot to start. Repeat _2__ times.  [facility] at [place], [place] [address]  [phone] (office) [phone] (fax)    [facility] [place], [place] [address]  [phone] (office) [phone] (fax)

## 2017-08-02 ENCOUNTER — Ambulatory Visit: Payer: BC Managed Care – PPO | Admitting: Physical Therapy

## 2017-08-02 ENCOUNTER — Encounter: Payer: Self-pay | Admitting: Physical Therapy

## 2017-08-02 DIAGNOSIS — M6281 Muscle weakness (generalized): Secondary | ICD-10-CM | POA: Diagnosis not present

## 2017-08-02 DIAGNOSIS — G8929 Other chronic pain: Secondary | ICD-10-CM

## 2017-08-02 DIAGNOSIS — M545 Low back pain: Secondary | ICD-10-CM | POA: Diagnosis not present

## 2017-08-02 DIAGNOSIS — R293 Abnormal posture: Secondary | ICD-10-CM

## 2017-08-02 NOTE — Therapy (Signed)
[place], [place], [address] Phone: [phone]   Fax:  [phone]  Physical Therapy Treatment  Patient Details  Name: Wanda [place] MRN: [id number] Date of Birth: 18-Mar-1956 Referring Provider: Dr. Dianah Field   Encounter Date: 08/02/2017  PT End of Session - 08/02/17 1107    Visit Number  3    Number of Visits  8    Date for PT Re-Evaluation  08/24/17    PT Start Time  1104    PT Stop Time  1200    PT Time Calculation (min)  56 min    Activity Tolerance  Patient tolerated treatment well    Behavior During Therapy  [facility] for tasks assessed/performed       Past Medical History:  Diagnosis Date  . Abnormal Pap smear, atypical squamous cells of undetermined sign (ASC-US) 1998   treated with cryo, CIN I  . ADD (attention deficit disorder) 02-23-12   easily distracted.  . Arthritis 02-23-12   Rt. hip and lt. arm  . Kidney calculi 07/2015  . Kidney stone   . PONV (postoperative nausea and vomiting) 02-23-12   always has PONV  . Post-nasal drainage 02-23-12   frequent a problem,causes a cough and mucus buildup in throat  . Urethral polyp 1998   Dr. Hartley Barefoot  . Varicose veins   . Venous insufficiency    chronic-denies any problems    Past Surgical History:  Procedure Laterality Date  . blood clot removed Left 01/2012   blood clot removed from left hand   . BREAST BIOPSY Right   . BREAST SURGERY  02-23-12   Rt. needle biopsy, recent -negative  path showed fibroadenoma  . COLONOSCOPY  08/2003   repeat 5 yrs.  . COLONOSCOPY W/ BIOPSIES  12/2008   1 polyp recheck 5 yrs  . EXCISION/RELEASE BURSA HIP  02/29/12  . HAND SURGERY  02-23-12   "blood clot" excised palm left hand 01-18-12  . KNEE ARTHROSCOPY W/ MENISCAL REPAIR  10/2012  . KNEE ARTHROSCOPY WITH EXCISION BAKER'S CYST Left 10/2012   meniscal repair  . ORIF METATARSAL FRACTURE Left 04/07/2017   Left 5th Metatarsal  . SHOULDER SURGERY Right 02/2015    Dr. Lennette Bihari Supple - [facility]  . 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  Subjective Assessment - 08/02/17 1107    Subjective  Pt reports she feels about the same as last visit.  Her back pain likes to alternate sides. She had her vision ck'd a couple months ago; no known depth perception issues.      Pertinent History  Lt THA, Lt OA knee  severe, osteopenia,  getting a new firm mattress soon and uses lumbar support.     Patient Stated Goals  get back to exercise, ( has access to a pool) , pick up her 10# dog and lose weight.     Currently in Pain?  Yes    Pain Score  6     Pain Location  Back    Pain Orientation  Lower;Right;Left Rt>Lt    Pain Descriptors / Indicators  Dull;Aching    Aggravating Factors   bending over    Pain Relieving Factors  heat, gentle stretches         [facility] PT Assessment - 08/02/17 0001      Assessment   Medical Diagnosis  Lumbar DDD    Referring Provider  Dr. Dianah Field    Onset Date/Surgical Date  07/13/17    Hand Dominance  Right    Next MD Visit  08/10/17        [facility] Adult PT Treatment/Exercise - 08/02/17 0001      Lumbar Exercises: Aerobic   Stationary Bike  NuStep L4, 1.5 min - stopped due to Lt knee pain.     Tread Mill  Brisk walk at 1.8 mph x 5 min       Lumbar Exercises: Standing   Other Standing Lumbar Exercises  lumbar rotation, gentle with arm swings (following DN);  forward bend with arms relaxed on counter (lat stretch) x 20 sec.       Lumbar Exercises: Seated   Other Seated Lumbar Exercises  modified childs pose, seated on stool with arms relaxed on table in front of her x 30 sec x 2 reps; repeated with lateral trunk flexion x 2each side.       Moist Heat Therapy   Number Minutes Moist Heat  15 Minutes    Moist Heat Location  Lumbar Spine      Electrical Stimulation   Electrical Stimulation Location  bilat lower lumbar  paraspinals    Electrical Stimulation Action  IFC    Electrical Stimulation Parameters   to tolerance     Electrical Stimulation Goals  Pain      Manual Therapy   Manual Therapy  Soft tissue mobilization    Soft tissue mobilization  STM to bilat QL, lumbar paraspinals, Rt glute       Trigger Point Dry Needling - 08/02/17 1130    Consent Given?  Yes    Education Handout Provided  Yes    Muscles Treated Upper Body  Quadratus Lumborum Rt side with good twitch reponse an muscle relaxtation          PT Long Term Goals - 07/31/17 1300      PT LONG TERM GOAL #1   Title  I with advanced HEP( 08/24/17)     Time  4    Period  Weeks    Status  On-going      PT LONG TERM GOAL #2   Title  Report pain decrease to allow full lumbar ROM ( 08/24/17)     Time  4    Status  On-going      PT LONG TERM GOAL #3   Title  demo bilat hip strength =/> 5-/5 to allow for normalized gait in the Lt LE. ( 08/24/17)    Time  4    Period  Weeks    Status  On-going      PT LONG TERM GOAL #4   Title  improve FOTO =/< 44% limited ( 08/24/17)     Time  4    Period  Weeks    Status  On-going            Plan - 08/02/17 1115    Clinical Impression Statement  Pt unable to tolerate NuStep; improved tolerance with treadmill at 1.46mph.  Pt had positive response with DN; good release of lumbar musculature (provided by Jeral Pinch, PT), followed by low back stretches.  Pt reported reduction of pain by 3-4 points at end of session.  Progressing towards goals.     Rehab Potential  Excellent    PT Frequency  2x / week    PT Duration  4 weeks    PT Treatment/Interventions  Gait training;Neuromuscular re-education;Dry needling;Manual techniques;Moist Heat;Ultrasound;Therapeutic activities;Patient/family education;Taping;Therapeutic exercise;Cryotherapy;Electrical Stimulation;Balance training    PT Next Visit Plan  assess response to DN.  continue spinal stabilization and balance exercises.     Consulted and  Agree with Plan of Care  Patient       Patient will benefit from skilled therapeutic intervention in order to improve the following deficits and impairments:  Abnormal gait, Pain, Postural dysfunction, Increased muscle spasms, Decreased strength  Visit Diagnosis: Chronic bilateral low back pain without sciatica  Muscle weakness (generalized)  Abnormal posture     Problem List Patient Active Problem List   Diagnosis Date Noted  . Closed displaced fracture of fifth metatarsal bone of left foot 03/31/2017  . Primary osteoarthritis of left knee 08/25/2016  . Baker's cyst of knee, left 08/25/2016  . Benign paroxysmal positional vertigo 08/25/2016  . Back pain 12/16/2015  . Cervical strain 11/21/2014  . Right shoulder pain 05/15/2014  . DDD (degenerative disc disease), lumbar 02/17/2014  . Unspecified vitamin D deficiency 03/18/2013  . Trochanteric bursitis of right hip 02/29/2012  . INSOMNIA 04/13/2010  . HYPERLIPIDEMIA 09/17/2007  . CERVICAL POLYP 09/17/2007  . Attention deficit disorder 09/11/2007  . UNSPECIFIED VENOUS INSUFFICIENCY 08/29/2007   Kerin Perna, PTA 08/02/17 11:55 AM   Jeral Pinch, PT 08/02/17 1:54 PM   [facility] [place], [place], [address] Phone: [phone]   Fax:  [phone]  Name: Wanda [place] MRN: [id number] Date of Birth: October 17, 1955

## 2017-08-07 ENCOUNTER — Ambulatory Visit (INDEPENDENT_AMBULATORY_CARE_PROVIDER_SITE_OTHER): Payer: BC Managed Care – PPO | Admitting: Physical Therapy

## 2017-08-07 ENCOUNTER — Encounter: Payer: Self-pay | Admitting: Physical Therapy

## 2017-08-07 DIAGNOSIS — R293 Abnormal posture: Secondary | ICD-10-CM

## 2017-08-07 DIAGNOSIS — M545 Low back pain, unspecified: Secondary | ICD-10-CM

## 2017-08-07 DIAGNOSIS — M6281 Muscle weakness (generalized): Secondary | ICD-10-CM

## 2017-08-07 DIAGNOSIS — G8929 Other chronic pain: Secondary | ICD-10-CM | POA: Diagnosis not present

## 2017-08-07 NOTE — Therapy (Signed)
[place], [place], [address] Phone: [phone]   Fax:  [phone]  Physical Therapy Treatment  Patient Details  Name: Wanda [place] MRN: [id number] Date of Birth: 06-01-1956 Referring Provider: Dr. Dianah Field   Encounter Date: 08/07/2017  PT End of Session - 08/07/17 1433    Visit Number  4    Number of Visits  8    Date for PT Re-Evaluation  08/24/17    PT Start Time  3500    PT Stop Time  1524    PT Time Calculation (min)  51 min    Activity Tolerance  Patient tolerated treatment well       Past Medical History:  Diagnosis Date  . Abnormal Pap smear, atypical squamous cells of undetermined sign (ASC-US) 1998   treated with cryo, CIN I  . ADD (attention deficit disorder) 02-23-12   easily distracted.  . Arthritis 02-23-12   Rt. hip and lt. arm  . Kidney calculi 07/2015  . Kidney stone   . PONV (postoperative nausea and vomiting) 02-23-12   always has PONV  . Post-nasal drainage 02-23-12   frequent a problem,causes a cough and mucus buildup in throat  . Urethral polyp 1998   Dr. Hartley Barefoot  . Varicose veins   . Venous insufficiency    chronic-denies any problems    Past Surgical History:  Procedure Laterality Date  . blood clot removed Left 01/2012   blood clot removed from left hand   . BREAST BIOPSY Right   . BREAST SURGERY  02-23-12   Rt. needle biopsy, recent -negative  path showed fibroadenoma  . COLONOSCOPY  08/2003   repeat 5 yrs.  . COLONOSCOPY W/ BIOPSIES  12/2008   1 polyp recheck 5 yrs  . EXCISION BURSA WITH ILIAL-TIBIAL BAND RELEASE Right 02/29/2012   Performed by Gearlean Alf, MD at [facility] ORS  . EXCISION/RELEASE BURSA HIP  02/29/12  . HAND SURGERY  02-23-12   "blood clot" excised palm left hand 01-18-12  . KNEE ARTHROSCOPY W/ MENISCAL REPAIR  10/2012  . KNEE ARTHROSCOPY WITH EXCISION BAKER'S CYST Left 10/2012   meniscal repair  . ORIF METATARSAL FRACTURE Left 04/07/2017   Left 5th Metatarsal  . SHOULDER SURGERY Right 02/2015   Dr. Lennette Bihari [facility]le - [facility]  . 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  Subjective Assessment - 08/07/17 1434    Subjective  She was pretty much pain free for two days after her last treatment, yesterday she had a shooting pain in the Rt hip, took tylenol to help.  Today it is in the Left low back and upper buttocks.     Pertinent History  Lt THA, Lt OA knee  severe, osteopenia,  getting a new firm mattress soon and uses lumbar support.     Patient Stated Goals  get back to exercise, ( has access to a pool) , pick up her 10# dog and lose weight.     Currently in Pain?  Yes    Pain Score  8     Pain Location  Back    Pain Orientation  Left    Pain Descriptors / Indicators  Shooting    Pain Onset  More than a month ago    Pain Frequency  Constant    Aggravating Factors   nothing it is just there.  Pain Relieving Factors  gentle stretches, medication         [facility] PT Assessment - 08/07/17 0001      Assessment   Medical Diagnosis  Lumbar DDD                  [facility] Adult PT Treatment/Exercise - 08/07/17 0001      Exercises   Exercises  Lumbar      Lumbar Exercises: Stretches   Lower Trunk Rotation  30 seconds    Quadruped Mid Back Stretch  -- 10 reps cat /cow     Quadruped Mid Back Stretch Limitations  childs pose with over pressure on her sacrum      Lumbar Exercises: Aerobic   Tread Mill  1.8 mph up to 2.0 mph, VC for upright posture x5'      Lumbar Exercises: Prone   Opposite Arm/Leg Raise  Left arm/Right leg;Right arm/Left leg;10 reps VC for form      Modalities   Modalities  Moist Heat;Electrical Stimulation      Moist Heat Therapy   Number Minutes Moist Heat  15 Minutes    Moist Heat Location  Lumbar Spine      Electrical Stimulation   Electrical Stimulation Location  bilat lower lumbar  paraspinals    Electrical Stimulation Action  IFC    Electrical Stimulation Parameters   to tolerance    Electrical Stimulation Goals  Tone;Pain      Manual Therapy   Manual Therapy  Soft tissue mobilization;Joint mobilization    Joint Mobilization  lumbar CPA L1-4 grade III       Trigger Point Dry Needling - 08/07/17 1439    Consent Given?  Yes    Education Handout Provided  No    Muscles Treated Upper Body  Longissimus    Longissimus Response  Twitch response elicited;Palpable increased muscle length L4-1 bilat with stim                PT Long Term Goals - 08/07/17 1452      PT LONG TERM GOAL #1   Title  I with advanced HEP( 08/24/17)     Status  On-going      PT LONG TERM GOAL #2   Title  Report pain decrease to allow full lumbar ROM ( 08/24/17)     Status  On-going      PT LONG TERM GOAL #3   Title  demo bilat hip strength =/> 5-/5 to allow for normalized gait in the Lt LE. ( 08/24/17)    Status  On-going      PT LONG TERM GOAL #4   Title  improve FOTO =/< 44% limited ( 08/24/17)     Status  On-going            Plan - 08/07/17 1453    Clinical Impression Statement  Wanda [place] had a good response to her last treatment,  however the pain returned worse the last two days.  No goals met.  She is very sensitive to dry needling however is able to relax once she takes some deep breathes.     Rehab Potential  Excellent    PT Frequency  2x / week    PT Treatment/Interventions  Gait training;Neuromuscular re-education;Dry needling;Manual techniques;Moist Heat;Ultrasound;Therapeutic activities;Patient/family education;Taping;Therapeutic exercise;Cryotherapy;Electrical Stimulation;Balance training    PT Next Visit Plan  assess response to DN.  continue spinal stabilization and balance exercises.     Consulted and Agree with Plan of Care  Patient  Patient will benefit from skilled therapeutic intervention in order to improve the following deficits and impairments:   Abnormal gait, Pain, Postural dysfunction, Increased muscle spasms, Decreased strength  Visit Diagnosis: Chronic bilateral low back pain without sciatica  Muscle weakness (generalized)  Abnormal posture     Problem List Patient Active Problem List   Diagnosis Date Noted  . Closed displaced fracture of fifth metatarsal bone of left foot 03/31/2017  . Primary osteoarthritis of left knee 08/25/2016  . Baker's cyst of knee, left 08/25/2016  . Benign paroxysmal positional vertigo 08/25/2016  . Back pain 12/16/2015  . Cervical strain 11/21/2014  . Right shoulder pain 05/15/2014  . DDD (degenerative disc disease), lumbar 02/17/2014  . Unspecified vitamin D deficiency 03/18/2013  . Trochanteric bursitis of right hip 02/29/2012  . INSOMNIA 04/13/2010  . HYPERLIPIDEMIA 09/17/2007  . CERVICAL POLYP 09/17/2007  . Attention deficit disorder 09/11/2007  . UNSPECIFIED VENOUS INSUFFICIENCY 08/29/2007    Jeral Pinch PT 08/07/2017, 3:11 PM  [facility] [place], [place], [address] Phone: [phone]   Fax:  [phone]  Name: Wanda [place] MRN: [id number] Date of Birth: August 22, 1956

## 2017-08-09 ENCOUNTER — Ambulatory Visit: Payer: BC Managed Care – PPO | Admitting: Physical Therapy

## 2017-08-09 ENCOUNTER — Encounter: Payer: Self-pay | Admitting: Physical Therapy

## 2017-08-09 DIAGNOSIS — M6281 Muscle weakness (generalized): Secondary | ICD-10-CM

## 2017-08-09 DIAGNOSIS — R293 Abnormal posture: Secondary | ICD-10-CM | POA: Diagnosis not present

## 2017-08-09 DIAGNOSIS — G8929 Other chronic pain: Secondary | ICD-10-CM

## 2017-08-09 DIAGNOSIS — M545 Low back pain, unspecified: Secondary | ICD-10-CM

## 2017-08-09 NOTE — Therapy (Signed)
[place], [place], [address] Phone: [phone]   Fax:  [phone]  Physical Therapy Treatment  Patient Details  Name: Wanda Gonzales MRN: [id number] Date of Birth: May 30, 1956 Referring Provider: Dr. Dianah Field   Encounter Date: 08/09/2017  PT End of Session - 08/09/17 0854    Visit Number  5    Number of Visits  8    Date for PT Re-Evaluation  08/24/17    PT Start Time  0853    PT Stop Time  0946    PT Time Calculation (min)  53 min    Activity Tolerance  Patient tolerated treatment well    Behavior During Therapy  [facility] for tasks assessed/performed       Past Medical History:  Diagnosis Date  . Abnormal Pap smear, atypical squamous cells of undetermined sign (ASC-US) 1998   treated with cryo, CIN I  . ADD (attention deficit disorder) 02-23-12   easily distracted.  . Arthritis 02-23-12   Rt. hip and lt. arm  . Kidney calculi 07/2015  . Kidney stone   . PONV (postoperative nausea and vomiting) 02-23-12   always has PONV  . Post-nasal drainage 02-23-12   frequent a problem,causes a cough and mucus buildup in throat  . Urethral polyp 1998   Dr. Hartley Barefoot  . Varicose veins   . Venous insufficiency    chronic-denies any problems    Past Surgical History:  Procedure Laterality Date  . blood clot removed Left 01/2012   blood clot removed from left hand   . BREAST BIOPSY Right   . BREAST SURGERY  02-23-12   Rt. needle biopsy, recent -negative  path showed fibroadenoma  . COLONOSCOPY  08/2003   repeat 5 yrs.  . COLONOSCOPY W/ BIOPSIES  12/2008   1 polyp recheck 5 yrs  . EXCISION/RELEASE BURSA HIP  02/29/12  . HAND SURGERY  02-23-12   "blood clot" excised palm left hand 01-18-12  . KNEE ARTHROSCOPY W/ MENISCAL REPAIR  10/2012  . KNEE ARTHROSCOPY WITH EXCISION BAKER'S CYST Left 10/2012   meniscal repair  . ORIF METATARSAL FRACTURE Left 04/07/2017   Left 5th Metatarsal  . SHOULDER SURGERY Right 02/2015    Dr. Lennette Bihari Supple - [facility]  . 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  Subjective Assessment - 08/09/17 0855    Subjective  Pt reports she was very sore and nauseous after last session. She did get some good sleep yesterday, so today is much better.  She notices the longer she stands, the more her hip hurts.     Patient Stated Goals  get back to exercise, ( has access to a pool) , pick up her 10# dog and lose weight.     Currently in Pain?  Yes    Pain Score  4     Pain Location  Hip    Pain Orientation  Left    Pain Descriptors / Indicators  Sore;Aching    Aggravating Factors   prolonged standing    Pain Relieving Factors  laying on firm surface, medicine, heat.          [facility] PT Assessment - 08/09/17 0001      Assessment   Medical Diagnosis  Lumbar DDD    Referring Provider  Dr. Dianah Field    Onset Date/Surgical Date  07/13/17  Hand Dominance  Right    Next MD Visit  08/10/17        [facility] Adult PT Treatment/Exercise - 08/09/17 0001      Lumbar Exercises: Stretches   Passive Hamstring Stretch  2 reps;30 seconds supine with strap    Piriformis Stretch  2 reps;30 seconds      Lumbar Exercises: Aerobic   Tread Mill  1.8 up to 2.0 mph x 5 min       Lumbar Exercises: Seated   Other Seated Lumbar Exercises  modified childs pose, seated on stool with arms relaxed on table in front of her x 30 sec x 2 reps; repeated with lateral trunk flexion x 2each side.       Lumbar Exercises: Sidelying   Hip Abduction  5 reps;2 seconds LLE, core engaged.       Modalities   Modalities  Moist Heat;Electrical Stimulation      Moist Heat Therapy   Number Minutes Moist Heat  20 Minutes    Moist Heat Location  Lumbar Spine;Hip      Electrical Stimulation   Electrical Stimulation Location  lumbar paraspinals and Lt glute    Electrical Stimulation Action  IFC    Electrical  Stimulation Parameters   to tolerance     Electrical Stimulation Goals  Tone;Pain      Manual Therapy   Manual Therapy  Myofascial release;Soft tissue mobilization    Soft tissue mobilization  TPR with and without contract/relax to Lt glute med, piriformis.  STM to Lt glute, lumbar paraspinals, ITB.     Myofascial Release  MFR to Lt QL, glute med                   PT Long Term Goals - 08/07/17 1452      PT LONG TERM GOAL #1   Title  I with advanced HEP( 08/24/17)     Status  On-going      PT LONG TERM GOAL #2   Title  Report pain decrease to allow full lumbar ROM ( 08/24/17)     Status  On-going      PT LONG TERM GOAL #3   Title  demo bilat hip strength =/> 5-/5 to allow for normalized gait in the Lt LE. ( 08/24/17)    Status  On-going      PT LONG TERM GOAL #4   Title  improve FOTO =/< 44% limited ( 08/24/17)     Status  On-going            Plan - 08/09/17 1228    Clinical Impression Statement  Pt had decreased palpable tightness/tenderness with TPR to Lt lateral hip.  Pt's overall pain has decreased since initiating therapy.  Pt reported 0/10 pain at end of session.  Pt will benefit from continued PT intervention to max functional mobility.     Rehab Potential  Excellent    PT Frequency  2x / week    PT Duration  4 weeks    PT Treatment/Interventions  Gait training;Neuromuscular re-education;Dry needling;Manual techniques;Moist Heat;Ultrasound;Therapeutic activities;Patient/family education;Taping;Therapeutic exercise;Cryotherapy;Electrical Stimulation;Balance training    PT Next Visit Plan  continue spinal stabilization and balance exercises. add hip abd exercises to HEP    Consulted and Agree with Plan of Care  Patient       Patient will benefit from skilled therapeutic intervention in order to improve the following deficits and impairments:  Abnormal gait, Pain, Postural dysfunction, Increased muscle spasms, Decreased strength  Visit  Diagnosis: Chronic  bilateral low back pain without sciatica  Muscle weakness (generalized)  Abnormal posture     Problem List Patient Active Problem List   Diagnosis Date Noted  . Closed displaced fracture of fifth metatarsal bone of left foot 03/31/2017  . Primary osteoarthritis of left knee 08/25/2016  . Baker's cyst of knee, left 08/25/2016  . Benign paroxysmal positional vertigo 08/25/2016  . Back pain 12/16/2015  . Cervical strain 11/21/2014  . Right shoulder pain 05/15/2014  . DDD (degenerative disc disease), lumbar 02/17/2014  . Unspecified vitamin D deficiency 03/18/2013  . Trochanteric bursitis of right hip 02/29/2012  . INSOMNIA 04/13/2010  . HYPERLIPIDEMIA 09/17/2007  . CERVICAL POLYP 09/17/2007  . Attention deficit disorder 09/11/2007  . UNSPECIFIED VENOUS INSUFFICIENCY 08/29/2007   Wanda Gonzales, Wanda Gonzales 08/09/17 12:32 PM  [place], [place], [address] Phone: [phone]   Fax:  [phone]  Name: Wanda Gonzales MRN: [id number] Date of Birth: [birth year]

## 2017-08-15 ENCOUNTER — Ambulatory Visit: Payer: Self-pay | Admitting: Sports Medicine

## 2017-08-16 ENCOUNTER — Ambulatory Visit (INDEPENDENT_AMBULATORY_CARE_PROVIDER_SITE_OTHER): Payer: BC Managed Care – PPO | Admitting: Physical Therapy

## 2017-08-16 ENCOUNTER — Encounter: Payer: Self-pay | Admitting: Physical Therapy

## 2017-08-16 DIAGNOSIS — R293 Abnormal posture: Secondary | ICD-10-CM | POA: Diagnosis not present

## 2017-08-16 DIAGNOSIS — M545 Low back pain: Secondary | ICD-10-CM | POA: Diagnosis not present

## 2017-08-16 DIAGNOSIS — M6281 Muscle weakness (generalized): Secondary | ICD-10-CM

## 2017-08-16 DIAGNOSIS — G8929 Other chronic pain: Secondary | ICD-10-CM | POA: Diagnosis not present

## 2017-08-16 NOTE — Therapy (Signed)
[place], [place], [address] Phone: [phone]   Fax:  [phone]  Physical Therapy Treatment  Patient Details  Name: Wanda Gonzales MRN: [id number] Date of Birth: 05-30-1956 Referring Provider: Dr Darene Lamer   Encounter Date: 08/16/2017  PT End of Session - 08/16/17 0717    Visit Number  6    Number of Visits  8    Date for PT Re-Evaluation  08/24/17    PT Start Time  6754    PT Stop Time  0821    PT Time Calculation (min)  64 min    Activity Tolerance  Patient tolerated treatment well       Past Medical History:  Diagnosis Date  . Abnormal Pap smear, atypical squamous cells of undetermined sign (ASC-US) 1998   treated with cryo, CIN I  . ADD (attention deficit disorder) 02-23-12   easily distracted.  . Arthritis 02-23-12   Rt. hip and lt. arm  . Kidney calculi 07/2015  . Kidney stone   . PONV (postoperative nausea and vomiting) 02-23-12   always has PONV  . Post-nasal drainage 02-23-12   frequent a problem,causes a cough and mucus buildup in throat  . Urethral polyp 1998   Dr. Hartley Barefoot  . Varicose veins   . Venous insufficiency    chronic-denies any problems    Past Surgical History:  Procedure Laterality Date  . blood clot removed Left 01/2012   blood clot removed from left hand   . BREAST BIOPSY Right   . BREAST SURGERY  02-23-12   Rt. needle biopsy, recent -negative  path showed fibroadenoma  . COLONOSCOPY  08/2003   repeat 5 yrs.  . COLONOSCOPY W/ BIOPSIES  12/2008   1 polyp recheck 5 yrs  . EXCISION/RELEASE BURSA HIP  02/29/12  . HAND SURGERY  02-23-12   "blood clot" excised palm left hand 01-18-12  . KNEE ARTHROSCOPY W/ MENISCAL REPAIR  10/2012  . KNEE ARTHROSCOPY WITH EXCISION BAKER'S CYST Left 10/2012   meniscal repair  . ORIF METATARSAL FRACTURE Left 04/07/2017   Left 5th Metatarsal  . SHOULDER SURGERY Right 02/2015   Dr. Lennette Bihari Supple - [facility]  . 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  Subjective Assessment - 08/16/17 0718    Subjective  [name] reports she responded well to the deep tissue work from her last visit, pain has returned and it is worse in the AM's    Patient Stated Goals  get back to exercise, ( has access to a pool) , pick up her 10# dog and lose weight.     Currently in Pain?  Yes    Pain Score  5     Pain Location  Back    Pain Orientation  Mid;Lower    Pain Descriptors / Indicators  Aching;Sore    Pain Type  Acute pain    Pain Onset  More than a month ago    Pain Frequency  Intermittent    Aggravating Factors   first thing in the AM    Pain Relieving Factors  moving around         [facility] PT Assessment - 08/16/17 0001      Assessment   Medical Diagnosis  Lumbar DDD    Referring Provider  Dr T      ROM / Strength  AROM / PROM / Strength  AROM;Strength      AROM   Lumbar Flexion  75% resent restricted by her knee weakness    Lumbar Extension  WNL spotting for balance    Lumbar - Right Side Bend  WNL: with pain    Lumbar - Left Side Bend  WNL with pain    Lumbar - Right Rotation  WNL    Lumbar - Left Rotation  WNL      Strength   Right Hip ABduction  5/5    Left Hip Flexion  -- 5-/5    Left Hip ABduction  4+/5                  [facility] Adult PT Treatment/Exercise - 08/16/17 0001      Exercises   Exercises  Lumbar      Lumbar Exercises: Stretches   Piriformis Stretch  2 reps;30 seconds each side      Lumbar Exercises: Aerobic   Tread Mill   2.0 mph x 5 min       Lumbar Exercises: Standing   Wall Slides  -- 3x8 with FWD reach holding 5#    Wall Slides Limitations  wt shifts to Rt due to Lt knee pain      Lumbar Exercises: Supine   Bridge  10 reps 3 sets, figure 4 with 5# wt    Bridge Limitations  VC for TA contractions      Modalities   Modalities  Moist Heat;Electrical Stimulation      Moist Heat Therapy   Number  Minutes Moist Heat  20 Minutes    Moist Heat Location  Lumbar Spine;Hip      Electrical Stimulation   Electrical Stimulation Location  lumbar paraspinals     Electrical Stimulation Action  IFC    Electrical Stimulation Parameters  to tolerance    Electrical Stimulation Goals  Tone;Pain      Manual Therapy   Manual Therapy  Myofascial release;Soft tissue mobilization    Soft tissue mobilization  TPR to bilat piriformis and muscles along the border of the sacrum, STM to bilat QL, lumbar paaspinals and gluts    Myofascial Release  MFR to Lt QL, glute med                   PT Long Term Goals - 08/16/17 0720      PT LONG TERM GOAL #1   Title  I with advanced HEP( 08/24/17)     Status  On-going      PT LONG TERM GOAL #2   Title  Report pain decrease to allow full lumbar ROM ( 08/24/17)     Status  Partially Met      PT LONG TERM GOAL #3   Title  demo bilat hip strength =/> 5-/5 to allow for normalized gait in the Lt LE. ( 08/24/17)    Status  Partially Met      PT LONG TERM GOAL #4   Title  improve FOTO =/< 44% limited ( 08/24/17)     Status  On-going            Plan - 08/16/17 0756    Clinical Impression Statement  Serene is making progress, has partially met her goals.  She still has tightness in bilat gluts and low back.  Left side is tighter than right and more tender. 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    Consulted and Agree with Plan of Care  Patient       Patient will benefit from skilled therapeutic intervention in order to improve the following deficits and  impairments:  Abnormal gait, Pain, Postural dysfunction, Increased muscle spasms, Decreased strength  Visit Diagnosis: Chronic bilateral low back pain without sciatica  Muscle weakness (generalized)  Abnormal posture     Problem List Patient Active Problem List   Diagnosis Date Noted  . Closed displaced fracture of fifth metatarsal bone of left foot 03/31/2017  . Primary osteoarthritis of left knee 08/25/2016  . Baker's cyst of knee, left 08/25/2016  . Benign paroxysmal positional vertigo 08/25/2016  . Back pain 12/16/2015  . Cervical strain 11/21/2014  . Right shoulder pain 05/15/2014  . DDD (degenerative disc disease), lumbar 02/17/2014  . Unspecified vitamin D deficiency 03/18/2013  . Trochanteric bursitis of right hip 02/29/2012  . INSOMNIA 04/13/2010  . HYPERLIPIDEMIA 09/17/2007  . CERVICAL POLYP 09/17/2007  . Attention deficit disorder 09/11/2007  . UNSPECIFIED VENOUS INSUFFICIENCY 08/29/2007   Jeral Pinch PT  08/16/2017, 8:45 AM  [facility] [place], [place], [address] Phone: [phone]   Fax:  [phone]  Name: KIERAN NACHTIGAL MRN: [id number] Date of Birth: 1956-03-20

## 2017-08-18 ENCOUNTER — Encounter: Payer: Self-pay | Admitting: Physical Therapy

## 2017-08-18 ENCOUNTER — Ambulatory Visit: Payer: BC Managed Care – PPO | Admitting: Physical Therapy

## 2017-08-18 DIAGNOSIS — M6281 Muscle weakness (generalized): Secondary | ICD-10-CM

## 2017-08-18 DIAGNOSIS — M545 Low back pain, unspecified: Secondary | ICD-10-CM

## 2017-08-18 DIAGNOSIS — G8929 Other chronic pain: Secondary | ICD-10-CM | POA: Diagnosis not present

## 2017-08-18 DIAGNOSIS — R293 Abnormal posture: Secondary | ICD-10-CM

## 2017-08-18 NOTE — Patient Instructions (Signed)
Tandem Stance    Right foot in front of left, heel touching toe both feet "straight ahead". Stand on Foot Triangle of Support with both feet. Balance in this position _30__ seconds. Do with left foot in front of right.   Then perform with head turns.   Toe taps to cup x 10 each leg.  Stand close to a counter.     [facility] [place], [place] [address]  [phone] (office) [phone] (fax)

## 2017-08-18 NOTE — Therapy (Signed)
[place], [place], [address] Phone: [phone]   Fax:  [phone]  Physical Therapy Treatment  Patient Details  Name: Wanda Gonzales MRN: [id number] Date of Birth: Mar 25, 1956 Referring Provider: Dr. Dianah Field   Encounter Date: 08/18/2017  PT End of Session - 08/18/17 1533    Visit Number  7    Number of Visits  8    Date for PT Re-Evaluation  08/24/17    PT Start Time  1536    PT Stop Time  1626    PT Time Calculation (min)  50 min    Activity Tolerance  Patient tolerated treatment well    Behavior During Therapy  [facility] for tasks assessed/performed       Past Medical History:  Diagnosis Date  . Abnormal Pap smear, atypical squamous cells of undetermined sign (ASC-US) 1998   treated with cryo, CIN I  . ADD (attention deficit disorder) 02-23-12   easily distracted.  . Arthritis 02-23-12   Rt. hip and lt. arm  . Kidney calculi 07/2015  . Kidney stone   . PONV (postoperative nausea and vomiting) 02-23-12   always has PONV  . Post-nasal drainage 02-23-12   frequent a problem,causes a cough and mucus buildup in throat  . Urethral polyp 1998   Dr. Hartley Barefoot  . Varicose veins   . Venous insufficiency    chronic-denies any problems    Past Surgical History:  Procedure Laterality Date  . blood clot removed Left 01/2012   blood clot removed from left hand   . BREAST BIOPSY Right   . BREAST SURGERY  02-23-12   Rt. needle biopsy, recent -negative  path showed fibroadenoma  . COLONOSCOPY  08/2003   repeat 5 yrs.  . COLONOSCOPY W/ BIOPSIES  12/2008   1 polyp recheck 5 yrs  . EXCISION/RELEASE BURSA HIP  02/29/12  . HAND SURGERY  02-23-12   "blood clot" excised palm left hand 01-18-12  . KNEE ARTHROSCOPY W/ MENISCAL REPAIR  10/2012  . KNEE ARTHROSCOPY WITH EXCISION BAKER'S CYST Left 10/2012   meniscal repair  . ORIF METATARSAL FRACTURE Left 04/07/2017   Left 5th Metatarsal  . SHOULDER SURGERY Right 02/2015    Dr. Lennette Bihari Supple - [facility]  . 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  Subjective Assessment - 08/18/17 1534    Subjective  Pt reports that mornings are the worst. She thinks she needs a new mattress. She states the manual therapy the last 2 sessions has helped. 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     Patient Stated Goals  get back to exercise, ( has access to a pool) , pick up her 10# dog and lose weight.     Currently in Pain?  Yes    Pain Score  2     Pain Location  Back    Pain Orientation  Lower;Mid    Pain Descriptors / Indicators  Aching;Sore    Aggravating Factors   first thing in AM    Pain Relieving Factors  heat, TENS         [facility] PT Assessment - 08/18/17 0001      Assessment   Medical Diagnosis  Lumbar DDD    Referring Provider  Dr. Dianah Field    Onset Date/Surgical Date  07/13/17  Hand Dominance  Right    Next MD Visit  08/10/17                   [facility] Adult PT Treatment/Exercise - 08/18/17 0001      Lumbar Exercises: Stretches   Passive Hamstring Stretch  2 reps;30 seconds supine with strap    Double Knee to Chest Stretch  30 seconds    Lower Trunk Rotation  4 reps arms in T      Lumbar Exercises: Aerobic   Tread Mill   2.0 mph x 5 min       Lumbar Exercises: Standing   Other Standing Lumbar Exercises  tandem stance with  horiz head turns, vertical head turns - 30 sec each leg x 2 reps; toe taps to 3" step x 10 each leg with occasional UE support to steady, repeated with 3" cup on step x 10 each leg.       Lumbar Exercises: Seated   Other Seated Lumbar Exercises  modified childs pose, seated on stool with arms relaxed on table in front of her x 30 sec x 2 reps; repeated with lateral trunk flexion x 2each side.       Modalities   Modalities  Moist Heat;Electrical Stimulation      Moist Heat Therapy   Number  Minutes Moist Heat  12 Minutes    Moist Heat Location  Lumbar Spine      Electrical Stimulation   Electrical Stimulation Location  lumbar paraspinals     Electrical Stimulation Action  IFC    Electrical Stimulation Parameters  to tolerance    Electrical Stimulation Goals  Tone;Pain      Manual Therapy   Soft tissue mobilization  STM to bilat lumbar paraspinals and QL.  STM to Lt glute med and piriformis.               PT Education - 08/18/17 1614    Education provided  Yes    Education Details  HEP - added 2 balance exercises     Person(s) Educated  Patient    Methods  Explanation;Handout;Verbal cues;Demonstration    Comprehension  Verbalized understanding;Returned demonstration          PT Long Term Goals - 08/18/17 1549      PT LONG TERM GOAL #1   Title  I with advanced HEP( 08/24/17)     Time  4    Period  Weeks    Status  On-going      PT LONG TERM GOAL #2   Title  Report pain decrease to allow full lumbar ROM ( 08/24/17)     Time  4    Period  Weeks    Status  Partially Met      PT LONG TERM GOAL #3   Title  demo bilat hip strength =/> 5-/5 to allow for normalized gait in the Lt LE. ( 08/24/17)    Time  4    Period  Weeks    Status  Partially Met      PT LONG TERM GOAL #4   Title  improve FOTO =/< 44% limited ( 08/24/17)     Time  4    Period  Weeks    Status  On-going      PT LONG TERM GOAL #5   Title  --    Time  --    Period  --    Status  --  Plan - 08/18/17 1618    Clinical Impression Statement  Pt demonstrated decreased balance with single leg stance exercise; added 2 exercises to HEP to assist with improvement of this.  Lt lumbar area was tighter than Rt today, however Rt was more point tender to touch.  Pt tolerated treatment well, reporting overall reduction of symptoms.  Pt progressing well towards remaining goals.     Rehab Potential  Excellent    PT Frequency  2x / week    PT Duration  4 weeks    PT  Treatment/Interventions  Gait training;Neuromuscular re-education;Dry needling;Manual techniques;Moist Heat;Ultrasound;Therapeutic activities;Patient/family education;Taping;Therapeutic exercise;Cryotherapy;Electrical Stimulation;Balance training    PT Next Visit Plan  FOTO and assess goals; end of POC.     Consulted and Agree with Plan of Care  Patient       Patient will benefit from skilled therapeutic intervention in order to improve the following deficits and impairments:  Abnormal gait, Pain, Postural dysfunction, Increased muscle spasms, Decreased strength  Visit Diagnosis: Chronic bilateral low back pain without sciatica  Muscle weakness (generalized)  Abnormal posture     Problem List Patient Active Problem List   Diagnosis Date Noted  . Closed displaced fracture of fifth metatarsal bone of left foot 03/31/2017  . Primary osteoarthritis of left knee 08/25/2016  . Baker's cyst of knee, left 08/25/2016  . Benign paroxysmal positional vertigo 08/25/2016  . Back pain 12/16/2015  . Cervical strain 11/21/2014  . Right shoulder pain 05/15/2014  . DDD (degenerative disc disease), lumbar 02/17/2014  . Unspecified vitamin D deficiency 03/18/2013  . Trochanteric bursitis of right hip 02/29/2012  . INSOMNIA 04/13/2010  . HYPERLIPIDEMIA 09/17/2007  . CERVICAL POLYP 09/17/2007  . Attention deficit disorder 09/11/2007  . UNSPECIFIED VENOUS INSUFFICIENCY 08/29/2007   Kerin Perna, PTA 08/18/17 4:22 PM  [place], [place], [address] Phone: [phone]   Fax:  [phone]  Name: Wanda Gonzales MRN: [id number] Date of Birth: 13-Dec-1955

## 2017-08-22 ENCOUNTER — Telehealth: Payer: Self-pay | Admitting: *Deleted

## 2017-08-22 NOTE — Telephone Encounter (Signed)
Pt requesting a letter releasing her back to work with no restrictions be mailed to her home. Please advise.

## 2017-08-25 ENCOUNTER — Ambulatory Visit: Payer: BC Managed Care – PPO | Admitting: Physical Therapy

## 2017-08-25 ENCOUNTER — Encounter: Payer: Self-pay | Admitting: Podiatry

## 2017-08-25 DIAGNOSIS — M6281 Muscle weakness (generalized): Secondary | ICD-10-CM | POA: Diagnosis not present

## 2017-08-25 DIAGNOSIS — G8929 Other chronic pain: Secondary | ICD-10-CM | POA: Diagnosis not present

## 2017-08-25 DIAGNOSIS — R293 Abnormal posture: Secondary | ICD-10-CM | POA: Diagnosis not present

## 2017-08-25 DIAGNOSIS — M545 Low back pain, unspecified: Secondary | ICD-10-CM

## 2017-08-25 NOTE — Therapy (Addendum)
[place] [place], [place], [address] Phone: [phone]   Fax:  [phone]  Physical Therapy Treatment  Patient Details  Name: Wanda Gonzales MRN: [id number] Date of Birth: [birth year] Referring Provider: Dr. Dianah Field   Encounter Date: 08/25/2017  PT End of Session - 08/25/17 1532    Visit Number  8    Number of Visits  8    PT Start Time  1525    PT Stop Time  1620    PT Time Calculation (min)  55 min    Activity Tolerance  Patient tolerated treatment well    Behavior During Therapy  [facility] for tasks assessed/performed       Past Medical History:  Diagnosis Date  . Abnormal Pap smear, atypical squamous cells of undetermined sign (ASC-US) 1998   treated with cryo, CIN I  . ADD (attention deficit disorder) 02-23-12   easily distracted.  . Arthritis 02-23-12   Rt. hip and lt. arm  . Kidney calculi 07/2015  . Kidney stone   . PONV (postoperative nausea and vomiting) 02-23-12   always has PONV  . Post-nasal drainage 02-23-12   frequent a problem,causes a cough and mucus buildup in throat  . Urethral polyp 1998   Dr. Hartley Barefoot  . Varicose veins   . Venous insufficiency    chronic-denies any problems    Past Surgical History:  Procedure Laterality Date  . blood clot removed Left 01/2012   blood clot removed from left hand   . BREAST BIOPSY Right   . BREAST SURGERY  02-23-12   Rt. needle biopsy, recent -negative  path showed fibroadenoma  . COLONOSCOPY  08/2003   repeat 5 yrs.  . COLONOSCOPY W/ BIOPSIES  12/2008   1 polyp recheck 5 yrs  . EXCISION/RELEASE BURSA HIP  02/29/12  . HAND SURGERY  02-23-12   "blood clot" excised palm left hand 01-18-12  . KNEE ARTHROSCOPY W/ MENISCAL REPAIR  10/2012  . KNEE ARTHROSCOPY WITH EXCISION BAKER'S CYST Left 10/2012   meniscal repair  . ORIF METATARSAL FRACTURE Left 04/07/2017   Left 5th Metatarsal  . SHOULDER SURGERY Right 02/2015   Dr. Lennette Bihari Supple - [facility]  .  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  Subjective Assessment - 08/25/17 1530    Subjective  "I've been fine except for Sunday.  Not sure what I did".  She reports 50% improvement since intiating therapy.     Patient Stated Goals  get back to exercise, ( has access to a pool) , pick up her 10# dog and lose weight.     Currently in Pain?  Yes    Pain Score  4     Pain Location  Back    Pain Orientation  Left;Lower    Aggravating Factors   first thing in AM    Pain Relieving Factors  heat, hot shower.          [facility] PT Assessment - 08/25/17 0001      Assessment   Medical Diagnosis  Lumbar DDD    Referring Provider  Dr. Dianah Field    Onset Date/Surgical Date  07/13/17    Hand Dominance  Right      Observation/Other Assessments   Focus on Therapeutic Outcomes (FOTO)   46% limited      AROM   Lumbar Flexion  WNL,  fingertips to toes.    Lumbar Extension  WNL    Lumbar - Right Side Bend  WNL: with pain    Lumbar - Left Side Bend  WNL    Lumbar - Right Rotation  WNL    Lumbar - Left Rotation  WNL      Strength   Right Hip ABduction  -- 5-/5    Left Hip ABduction  4/5                  [facility] Adult PT Treatment/Exercise - 08/25/17 0001      Lumbar Exercises: Stretches   Passive Hamstring Stretch  2 reps;30 seconds supine with strap    Double Knee to Chest Stretch  1 rep;30 seconds    Prone on Elbows Stretch  1 rep;10 seconds unable to tolerate    Quadruped Mid Back Stretch Limitations  childs pose with arms on table, then with lateral trunk flexion x 4-5 reps each direction     Piriformis Stretch  2 reps;30 seconds each side      Lumbar Exercises: Aerobic   Tread Mill   2.0 mph x 5 min       Lumbar Exercises: Supine   Other Supine Lumbar Exercises  educated pt with demo of abd exercises she can perform at gym and which exercises to avoid.  Pt verbalized  understanding       Lumbar Exercises: Sidelying   Clam  10 reps    Hip Abduction  10 reps      Modalities   Modalities  Moist Heat;Electrical Stimulation      Moist Heat Therapy   Number Minutes Moist Heat  15 Minutes    Moist Heat Location  Lumbar Spine      Electrical Stimulation   Electrical Stimulation Location  lumbar paraspinals     Electrical Stimulation Action  IFC    Electrical Stimulation Parameters  to tolerance     Electrical Stimulation Goals  Tone;Pain      Manual Therapy   Soft tissue mobilization  TPR with and without contract/relax to Lt glute med, piriformis.  STM to Lt glute, lumbar paraspinals, ITB.     Myofascial Release  MFR to Lt QL, glute med                   PT Long Term Goals - 08/25/17 1532      PT LONG TERM GOAL #1   Title  I with advanced HEP( 08/24/17)     Time  4    Period  Weeks    Status  Achieved      PT LONG TERM GOAL #2   Title  Report pain decrease to allow full lumbar ROM ( 08/24/17)     Time  4    Period  Weeks    Status  Partially Met      PT LONG TERM GOAL #3   Title  demo bilat hip strength =/> 5-/5 to allow for normalized gait in the Lt LE. ( 08/24/17)    Time  4    Period  Weeks    Status  Partially Met      PT LONG TERM GOAL #4   Title  improve FOTO =/< 44% limited ( 08/24/17)     Time  4    Period  Weeks    Status  Not Met            Plan - 08/25/17 1619  Clinical Impression Statement  Pt scored 46% on FOTO; improved from 58%. Her Lumbar ROM has improved.  she cont to have Lt hip weakness and tenderness with manual therapy.  Pt reported reduction of symptoms with manual therapy and estim.  Pt has partially met her goals and has requested to hold therapy for 2wks.     Rehab Potential  Excellent    PT Frequency  2x / week    PT Duration  4 weeks    PT Treatment/Interventions  Gait training;Neuromuscular re-education;Dry needling;Manual techniques;Moist Heat;Ultrasound;Therapeutic  activities;Patient/family education;Taping;Therapeutic exercise;Cryotherapy;Electrical Stimulation;Balance training    PT Next Visit Plan  spoke to supervising PT; will hold therapy for 2 wks per pt request.  If pt doesn't return by 12/21, will d/c to HEP.     Consulted and Agree with Plan of Care  Patient       Patient will benefit from skilled therapeutic intervention in order to improve the following deficits and impairments:  Abnormal gait, Pain, Postural dysfunction, Increased muscle spasms, Decreased strength  Visit Diagnosis: Chronic bilateral low back pain without sciatica  Muscle weakness (generalized)  Abnormal posture     Problem List Patient Active Problem List   Diagnosis Date Noted  . Closed displaced fracture of fifth metatarsal bone of left foot 03/31/2017  . Primary osteoarthritis of left knee 08/25/2016  . Baker's cyst of knee, left 08/25/2016  . Benign paroxysmal positional vertigo 08/25/2016  . Back pain 12/16/2015  . Cervical strain 11/21/2014  . Right shoulder pain 05/15/2014  . DDD (degenerative disc disease), lumbar 02/17/2014  . Unspecified vitamin D deficiency 03/18/2013  . Trochanteric bursitis of right hip 02/29/2012  . INSOMNIA 04/13/2010  . HYPERLIPIDEMIA 09/17/2007  . CERVICAL POLYP 09/17/2007  . Attention deficit disorder 09/11/2007  . UNSPECIFIED VENOUS INSUFFICIENCY 08/29/2007   Kerin Perna, PTA 08/25/17 4:30 PM  [facility] [place], [place], [address] Phone: [phone]   Fax:  [phone]  Name: Wanda Gonzales MRN: [id number] Date of Birth: 01-15-1956   PHYSICAL THERAPY DISCHARGE SUMMARY  Visits from Start of Care: 8  Current functional level related to goals / functional outcomes: See above for function at last visit   Remaining deficits: unknown   Education / Equipment: HEP Plan: Patient agrees to discharge.  Patient goals were partially met.  Patient is being discharged due to being pleased with the current functional level.  ?????    Jeral Pinch, PT 10/24/17 8:55 AM

## 2017-08-30 ENCOUNTER — Telehealth: Payer: Self-pay | Admitting: *Deleted

## 2017-08-30 ENCOUNTER — Encounter: Payer: Self-pay | Admitting: Podiatry

## 2017-08-30 NOTE — Telephone Encounter (Signed)
08/30/17 Patient called and request a note written for release to go back to gym.

## 2017-10-04 ENCOUNTER — Telehealth: Payer: Self-pay | Admitting: Sports Medicine

## 2017-10-04 MED ORDER — SODIUM HYALURONATE (VISCOSUP) 25 MG/2.5ML IX SOSY: PREFILLED_SYRINGE | INTRA_ARTICULAR | 0 refills | Status: DC

## 2017-10-04 NOTE — Telephone Encounter (Signed)
Thank you for putting it all in and making this easy for me Boogs.

## 2017-10-04 NOTE — Telephone Encounter (Signed)
Pt would like to complete the Supartz series again. It has been 6 months since last series, left knee pain has returned. Pt is also reporting some pain where her bakers cyst is located. Pending Rx to be sent to speciality pharmacy.

## 2017-10-06 NOTE — Telephone Encounter (Signed)
2 days ago I sent in the [place] prescription to her specialty pharmacy, we are awaiting delivery, she should contact her specialty pharmacy to see if it has been shipped and to get a tracking number.

## 2017-10-06 NOTE — Telephone Encounter (Signed)
Patient calling back in for update/status of whether or not she can have the knee injection.

## 2017-10-11 NOTE — Telephone Encounter (Signed)
Called pharmacy, they are working on insurance information at this time. They will contact the Pt once complete, then contact our office regarding delivery.  Pt advised of current status, no further questions.

## 2017-10-12 ENCOUNTER — Other Ambulatory Visit: Payer: Self-pay | Admitting: Family Medicine

## 2017-10-24 NOTE — Telephone Encounter (Signed)
Patient called back because she hasn't heard anything for the specialty pharmacy. I gave her the number to call the specialty pharmacy.    [place] - [address] [phone] (Phone) [phone] (Fax)

## 2017-11-01 NOTE — Telephone Encounter (Signed)
Received call from [facility], scheduled delivery for Supartz. Pt advised of status update. We will call Pt when they arrive and schedule her.

## 2017-11-03 ENCOUNTER — Ambulatory Visit: Payer: BC Managed Care – PPO | Admitting: Sports Medicine

## 2017-11-03 ENCOUNTER — Encounter: Payer: Self-pay | Admitting: Sports Medicine

## 2017-11-03 DIAGNOSIS — M1712 Unilateral primary osteoarthritis, left knee: Secondary | ICD-10-CM | POA: Diagnosis not present

## 2017-11-03 NOTE — Assessment & Plan Note (Signed)
Previous Supartz series was 8 months ago. Restarting Supartz in the left knee, injection #1 of 5, return in 1 week for #2 of 5.

## 2017-11-03 NOTE — Addendum Note (Signed)
Addended by: Jamesetta So on: 11/03/2017 03:34 PM   Modules accepted: Orders

## 2017-11-03 NOTE — Progress Notes (Signed)
   Procedure: Real-time Ultrasound Guided Injection of left knee Device: GE Logiq E  Verbal informed consent obtained.  Time-out conducted.  Noted no overlying erythema, induration, or other signs of local infection.  Skin prepped in a sterile fashion.  Local anesthesia: Topical Ethyl chloride.  With sterile technique and under real time ultrasound guidance: 25 mg/2.5 mL of Supartz (sodium hyaluronate) in a prefilled syringe was injected easily into the knee through a 22-gauge needle. Completed without difficulty  Pain immediately resolved suggesting accurate placement of the medication.  Advised to call if fevers/chills, erythema, induration, drainage, or persistent bleeding.  Images permanently stored and available for review in the ultrasound unit.  Impression: Technically successful ultrasound guided injection.

## 2017-11-10 ENCOUNTER — Ambulatory Visit: Payer: BC Managed Care – PPO | Admitting: Sports Medicine

## 2017-11-10 ENCOUNTER — Encounter: Payer: Self-pay | Admitting: Sports Medicine

## 2017-11-10 DIAGNOSIS — M1712 Unilateral primary osteoarthritis, left knee: Secondary | ICD-10-CM

## 2017-11-10 NOTE — Assessment & Plan Note (Signed)
Injection #2 of 5 into the left knee, return in 1 week for #3 of 5.

## 2017-11-10 NOTE — Progress Notes (Signed)
   Procedure: Real-time Ultrasound Guided Injection of left knee Device: GE Logiq E  Verbal informed consent obtained.  Time-out conducted.  Noted no overlying erythema, induration, or other signs of local infection.  Skin prepped in a sterile fashion.  Local anesthesia: Topical Ethyl chloride.  With sterile technique and under real time ultrasound guidance: 25 mg/2.5 mL of Supartz (sodium hyaluronate) in a prefilled syringe was injected easily into the knee through a 22-gauge needle. Completed without difficulty  Pain immediately resolved suggesting accurate placement of the medication.  Advised to call if fevers/chills, erythema, induration, drainage, or persistent bleeding.  Images permanently stored and available for review in the ultrasound unit.  Impression: Technically successful ultrasound guided injection.

## 2017-11-17 ENCOUNTER — Encounter: Payer: Self-pay | Admitting: Sports Medicine

## 2017-11-17 ENCOUNTER — Ambulatory Visit: Payer: BC Managed Care – PPO | Admitting: Sports Medicine

## 2017-11-17 ENCOUNTER — Ambulatory Visit (HOSPITAL_BASED_OUTPATIENT_CLINIC_OR_DEPARTMENT_OTHER)
Admission: RE | Admit: 2017-11-17 | Discharge: 2017-11-17 | Disposition: A | Discharge status: Home / Self Care | Payer: BC Managed Care – PPO | Source: Ambulatory Visit | Attending: Sports Medicine | Admitting: Sports Medicine

## 2017-11-17 DIAGNOSIS — M1712 Unilateral primary osteoarthritis, left knee: Secondary | ICD-10-CM | POA: Diagnosis not present

## 2017-11-17 DIAGNOSIS — M7122 Synovial cyst of popliteal space [Baker], left knee: Secondary | ICD-10-CM | POA: Insufficient documentation

## 2017-11-17 MED ORDER — TRAMADOL HCL 50 MG PO TABS: ORAL_TABLET | ORAL | 0 refills | Status: DC

## 2017-11-17 MED ORDER — DICLOFENAC SODIUM 2 % TD SOLN: 2.0000 | Freq: Two times a day (BID) | TRANSDERMAL | 11 refills | Status: DC

## 2017-11-17 NOTE — Assessment & Plan Note (Addendum)
Supartz injection #3 of 5 into the left knee, return in 1 week for #4 of 5. I have also added topical Pennsaid (diclofenac 2% topical) for use as well as some tramadol for breakthrough pain.

## 2017-11-17 NOTE — Assessment & Plan Note (Signed)
Wanda Gonzales has had a Baker's cyst in the past, she has a significant amount of pain and this feels somewhat different, I would like her to do an official ultrasound downstairs to ensure no evidence of DVT.

## 2017-11-17 NOTE — Addendum Note (Signed)
Addended by: Silverio Decamp on: 11/17/2017 03:54 PM   Modules accepted: Orders

## 2017-11-17 NOTE — Progress Notes (Signed)
   Procedure: Real-time Ultrasound Guided Injection of left knee Device: GE Logiq E  Verbal informed consent obtained.  Time-out conducted.  Noted no overlying erythema, induration, or other signs of local infection.  Skin prepped in a sterile fashion.  Local anesthesia: Topical Ethyl chloride.  With sterile technique and under real time ultrasound guidance: 25 mg/2.5 mL of Supartz (sodium hyaluronate) in a prefilled syringe was injected easily into the knee through a 22-gauge needle.  She did have a mild to moderate effusion, we will do an arthrocentesis first if still present at the next visit. Completed without difficulty  Pain immediately resolved suggesting accurate placement of the medication.  Advised to call if fevers/chills, erythema, induration, drainage, or persistent bleeding.  Images permanently stored and available for review in the ultrasound unit.  Impression: Technically successful ultrasound guided injection.

## 2017-11-23 ENCOUNTER — Ambulatory Visit: Payer: BC Managed Care – PPO | Admitting: Sports Medicine

## 2017-11-23 DIAGNOSIS — M7122 Synovial cyst of popliteal space [Baker], left knee: Secondary | ICD-10-CM | POA: Diagnosis not present

## 2017-11-23 DIAGNOSIS — L821 Other seborrheic keratosis: Secondary | ICD-10-CM

## 2017-11-23 DIAGNOSIS — M1712 Unilateral primary osteoarthritis, left knee: Secondary | ICD-10-CM

## 2017-11-23 NOTE — Progress Notes (Signed)
   Procedure: Real-time Ultrasound Guided aspiration of left knee Baker's cyst Device: GE Logiq E  Verbal informed consent obtained.  Time-out conducted.  Noted no overlying erythema, induration, or other signs of local infection.  Skin prepped in a sterile fashion.  Local anesthesia: Topical Ethyl chloride.  With sterile technique and under real time ultrasound guidance: Aspirated approximately 1 mL of clear fluid. Completed without difficulty  Pain immediately resolved suggesting accurate placement of the medication.  Advised to call if fevers/chills, erythema, induration, drainage, or persistent bleeding.  Images permanently stored and available for review in the ultrasound unit.  Impression: Technically successful ultrasound guided injection.  Procedure: Real-time Ultrasound Guided aspiration/injection of left knee Device: GE Logiq E  Verbal informed consent obtained.  Time-out conducted.  Noted no overlying erythema, induration, or other signs of local infection.  Skin prepped in a sterile fashion.  Local anesthesia: Topical Ethyl chloride.  With sterile technique and under real time ultrasound guidance: Aspirated 10 mL of straw colored fluid, syringe switched and 25 mg/2.5 mL of Supartz (sodium hyaluronate) in a prefilled syringe was injected easily into the knee through a 18-gauge needle. Completed without difficulty  Pain immediately resolved suggesting accurate placement of the medication.  Advised to call if fevers/chills, erythema, induration, drainage, or persistent bleeding.  Images permanently stored and available for review in the ultrasound unit.  Impression: Technically successful ultrasound guided injection.  Procedure:  Cryodestruction of right dorsal forearm seborrheic keratosis Consent obtained and verified. Time-out conducted. Noted no overlying erythema, induration, or other signs of local infection. Completed without difficulty using Cryo-Gun. Advised to call  if fevers/chills, erythema, induration, drainage, or persistent bleeding.

## 2017-11-23 NOTE — Assessment & Plan Note (Signed)
Cryotherapy, right dorsal forearm.

## 2017-11-23 NOTE — Assessment & Plan Note (Addendum)
Supartz No. 4 of 5 into the left knee. Return in 1 week for #5 of 5. We also drained an incidental Baker's cyst.

## 2017-11-23 NOTE — Assessment & Plan Note (Signed)
Baker's cyst drained again.

## 2017-11-29 ENCOUNTER — Encounter: Payer: Self-pay | Admitting: Sports Medicine

## 2017-11-29 ENCOUNTER — Ambulatory Visit: Payer: BC Managed Care – PPO | Admitting: Sports Medicine

## 2017-11-29 DIAGNOSIS — M1712 Unilateral primary osteoarthritis, left knee: Secondary | ICD-10-CM

## 2017-11-29 DIAGNOSIS — L821 Other seborrheic keratosis: Secondary | ICD-10-CM | POA: Diagnosis not present

## 2017-11-29 DIAGNOSIS — L918 Other hypertrophic disorders of the skin: Secondary | ICD-10-CM | POA: Insufficient documentation

## 2017-11-29 NOTE — Progress Notes (Addendum)
   Procedure: Real-time Ultrasound Guided Injection of left knee Device: GE Logiq E  Verbal informed consent obtained.  Time-out conducted.  Noted no overlying erythema, induration, or other signs of local infection.  Skin prepped in a sterile fashion.  Local anesthesia: Topical Ethyl chloride.  With sterile technique and under real time ultrasound guidance: 25 mg/2.5 mL of Supartz (sodium hyaluronate) in a prefilled syringe was injected easily into the knee through a 22-gauge needle. Completed without difficulty  Pain immediately resolved suggesting accurate placement of the medication.  Advised to call if fevers/chills, erythema, induration, drainage, or persistent bleeding.  Images permanently stored and available for review in the ultrasound unit.  Impression: Technically successful ultrasound guided injection.  Procedure:  Cryodestruction of right dorsal forearm seborrheic keratosis Consent obtained and verified. Time-out conducted. Noted no overlying erythema, induration, or other signs of local infection. Completed without difficulty using Cryo-Gun. Advised to call if fevers/chills, erythema, induration, drainage, or persistent bleeding.  Procedure:  Cryodestruction of right inferior eyelid skin tag Consent obtained and verified. Time-out conducted. Noted no overlying erythema, induration, or other signs of local infection. Completed without difficulty using Cryo-Gun, and a small ear speculum. Advised to call if fevers/chills, erythema, induration, drainage, or persistent bleeding.

## 2017-11-29 NOTE — Assessment & Plan Note (Signed)
Right inferior eyelid. Cryotherapy performed through a small ear speculum, we were very ginger with this so I think it will probably need another treatment.

## 2017-11-29 NOTE — Assessment & Plan Note (Signed)
Supartz No. 5 of 5, return in a month.

## 2017-11-29 NOTE — Assessment & Plan Note (Signed)
Repeat cryo destruction of right dorsal forearm seborrheic keratosis

## 2017-12-27 ENCOUNTER — Ambulatory Visit: Payer: BC Managed Care – PPO | Admitting: Sports Medicine

## 2017-12-27 ENCOUNTER — Encounter: Payer: Self-pay | Admitting: Sports Medicine

## 2017-12-27 DIAGNOSIS — L918 Other hypertrophic disorders of the skin: Secondary | ICD-10-CM | POA: Diagnosis not present

## 2017-12-27 DIAGNOSIS — L821 Other seborrheic keratosis: Secondary | ICD-10-CM

## 2017-12-27 DIAGNOSIS — M1712 Unilateral primary osteoarthritis, left knee: Secondary | ICD-10-CM

## 2017-12-27 DIAGNOSIS — M7122 Synovial cyst of popliteal space [Baker], left knee: Secondary | ICD-10-CM

## 2017-12-27 NOTE — Progress Notes (Signed)
Subjective:    CC: Several issues  HPI: Knee osteoarthritis: Pain is for the most part resolved after a series of Supartz.  Baker's cyst: Left-sided, we have done an aspiration several times, this is most certainly secondary to her end-stage knee osteoarthritis, desires repeat aspiration today.  Skin tag, seborrheic keratosis: Good improvement to the seborrheic keratosis on the right forearm with cryotherapy at the last visit, right lower eyelid skin tag remains.  I reviewed the past medical history, family history, social history, surgical history, and allergies today and no changes were needed.  Please see the problem list section below in epic for further details.  Past Medical History: Past Medical History:  Diagnosis Date  . Abnormal Pap smear, atypical squamous cells of undetermined sign (ASC-US) 1998   treated with cryo, CIN I  . ADD (attention deficit disorder) 02-23-12   easily distracted.  . Arthritis 02-23-12   Rt. hip and lt. arm  . Kidney calculi 07/2015  . Kidney stone   . PONV (postoperative nausea and vomiting) 02-23-12   always has PONV  . Post-nasal drainage 02-23-12   frequent a problem,causes a cough and mucus buildup in throat  . Urethral polyp 1998   Dr. Hartley Barefoot  . Varicose veins   . Venous insufficiency    chronic-denies any problems   Past Surgical History: Past Surgical History:  Procedure Laterality Date  . blood clot removed Left 01/2012   blood clot removed from left hand   . BREAST BIOPSY Right   . BREAST SURGERY  02-23-12   Rt. needle biopsy, recent -negative  path showed fibroadenoma  . COLONOSCOPY  08/2003   repeat 5 yrs.  . COLONOSCOPY W/ BIOPSIES  12/2008   1 polyp recheck 5 yrs  . EXCISION/RELEASE BURSA HIP  02/29/12  . HAND SURGERY  02-23-12   "blood clot" excised palm left hand 01-18-12  . KNEE ARTHROSCOPY W/ MENISCAL REPAIR  10/2012  . KNEE ARTHROSCOPY WITH EXCISION BAKER'S CYST Left 10/2012   meniscal repair  . ORIF METATARSAL FRACTURE Left  04/07/2017   Left 5th Metatarsal  . SHOULDER SURGERY Right 02/2015   Dr. Lennette Bihari Supple - [facility]  . 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 Marital status: Married    Spouse name: Not on file  . Number of children: Not on file  . Years of education: Not on file  . Highest education level: Not on file  Occupational History  . Not on file  Social Needs  . Financial resource strain: Not on file  . Food insecurity:    Worry: Not on file    Inability: Not on file  . Transportation needs:    Medical: Not on file    Non-medical: Not on file  Tobacco Use  . Smoking status: Never Smoker  . Smokeless tobacco: Never Used  Substance and Sexual Activity  . Alcohol use: Yes    Alcohol/week: 0.6 oz    Types: 1 Glasses of wine per week    Comment: rare occ.  . Drug use: No  . Sexual activity: Yes    Birth control/protection: Post-menopausal  Lifestyle  . Physical activity:    Days per week: Not on file    Minutes per session: Not on file  . Stress: Not on file  Relationships  . Social connections:    Talks on phone: Not on file  Gets together: Not on file    Attends religious service: Not on file    Active member of club or organization: Not on file    Attends meetings of clubs or organizations: Not on file    Relationship status: Not on file  Other Topics Concern  . 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   Family History: Family History  Problem Relation Age of Onset  . Breast cancer Mother 36       breast  . Hyperlipidemia Mother   . Brain cancer Father 57       brain  . Osteoporosis Maternal Grandmother   . Drug abuse Brother    Allergies: Allergies  Allergen Reactions  . Codeine Other (See Comments)    REACTION: nausea and dizzy  . Doxycycline Nausea Only  . Concerta  [Methylphenidate] Hives and Rash  . Sulfa Antibiotics Nausea Only   Medications: See med rec.  Review of Systems: No fevers, chills, night sweats, weight loss, chest pain, or shortness of breath.   Objective:    General: Well Developed, well nourished, and in no acute distress.  Neuro: Alert and oriented x3, extra-ocular muscles intact, sensation grossly intact.  HEENT: Normocephalic, atraumatic, pupils equal round reactive to light, neck supple, no masses, no lymphadenopathy, thyroid nonpalpable.  Skin: Warm and dry, no rashes. Cardiac: Regular rate and rhythm, no murmurs rubs or gallops, no lower extremity edema.  Respiratory: Clear to auscultation bilaterally. Not using accessory muscles, speaking in full sentences.  Procedure:  Cryodestruction of right forearm seborrheic keratosis Consent obtained and verified. Time-out conducted. Noted no overlying erythema, induration, or other signs of local infection. Completed without difficulty using Cryo-Gun. Advised to call if fevers/chills, erythema, induration, drainage, or persistent bleeding.  Procedure:  Cryodestruction of right lower eyelid skin tag Consent obtained and verified. Time-out conducted. Noted no overlying erythema, induration, or other signs of local infection. Completed without difficulty using liquid nitrogen and a cotton applicator tip. Advised to call if fevers/chills, erythema, induration, drainage, or persistent bleeding.  Procedure: Real-time Ultrasound guided aspiration of a left knee Baker's cyst Device: GE Logiq E  Verbal informed consent obtained.  Time-out conducted.  Noted no overlying erythema, induration, or other signs of local infection.  Skin prepped in a sterile fashion.  Local anesthesia: Topical Ethyl chloride.  With sterile technique and under real time ultrasound guidance: Using an 18-gauge needle advanced into the Baker's cyst, and aspirated scant thick fluid. Completed without difficulty    Pain immediately resolved suggesting accurate placement of the medication.  Advised to call if fevers/chills, erythema, induration, drainage, or persistent bleeding.  Images permanently stored and available for review in the ultrasound unit.  Impression: Technically successful ultrasound guided injection.  Impression and Recommendations:    Primary osteoarthritis of left knee Pain-free after 5 Supartz injections  Baker's cyst of knee, left Aspiration today.  Seborrheic keratosis Repeat cryotherapy of right forearm SK  Skin tag Repeat cryotherapy of right inferior eyelid skin tag. ___________________________________________ Gwen Her. Dianah Field, M.D., ABFM., CAQSM. Primary Care and [place] Instructor of [place] [facility]

## 2017-12-27 NOTE — Assessment & Plan Note (Signed)
Repeat cryotherapy of right inferior eyelid skin tag.

## 2017-12-27 NOTE — Assessment & Plan Note (Signed)
Repeat cryotherapy of right forearm SK

## 2017-12-27 NOTE — Assessment & Plan Note (Signed)
Aspiration today.

## 2017-12-27 NOTE — Assessment & Plan Note (Signed)
Pain-free after 5 Supartz injections

## 2018-01-10 ENCOUNTER — Ambulatory Visit: Payer: BC Managed Care – PPO | Admitting: Physician Assistant

## 2018-01-11 IMAGING — DX DG FOOT COMPLETE 3+V*L*
3 series · 3 of 3 positions shown · non-contrast
Comparison: 03/31/2017

CLINICAL DATA: Fifth metatarsal fracture.

EXAM:
LEFT FOOT - COMPLETE 3+ VIEW

[foot ap]
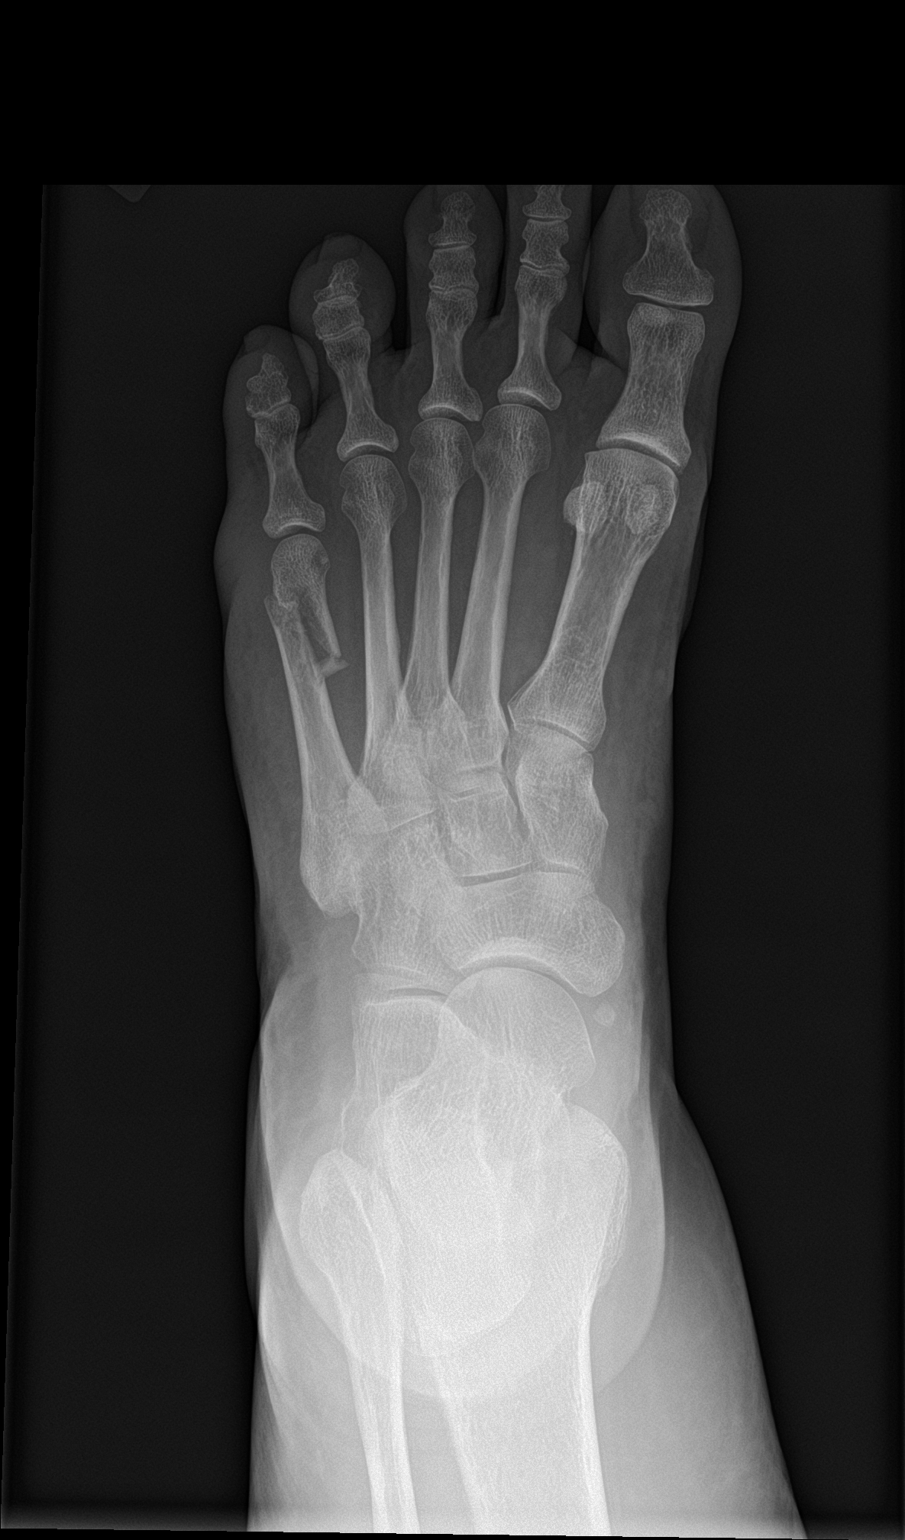

[foot obl]
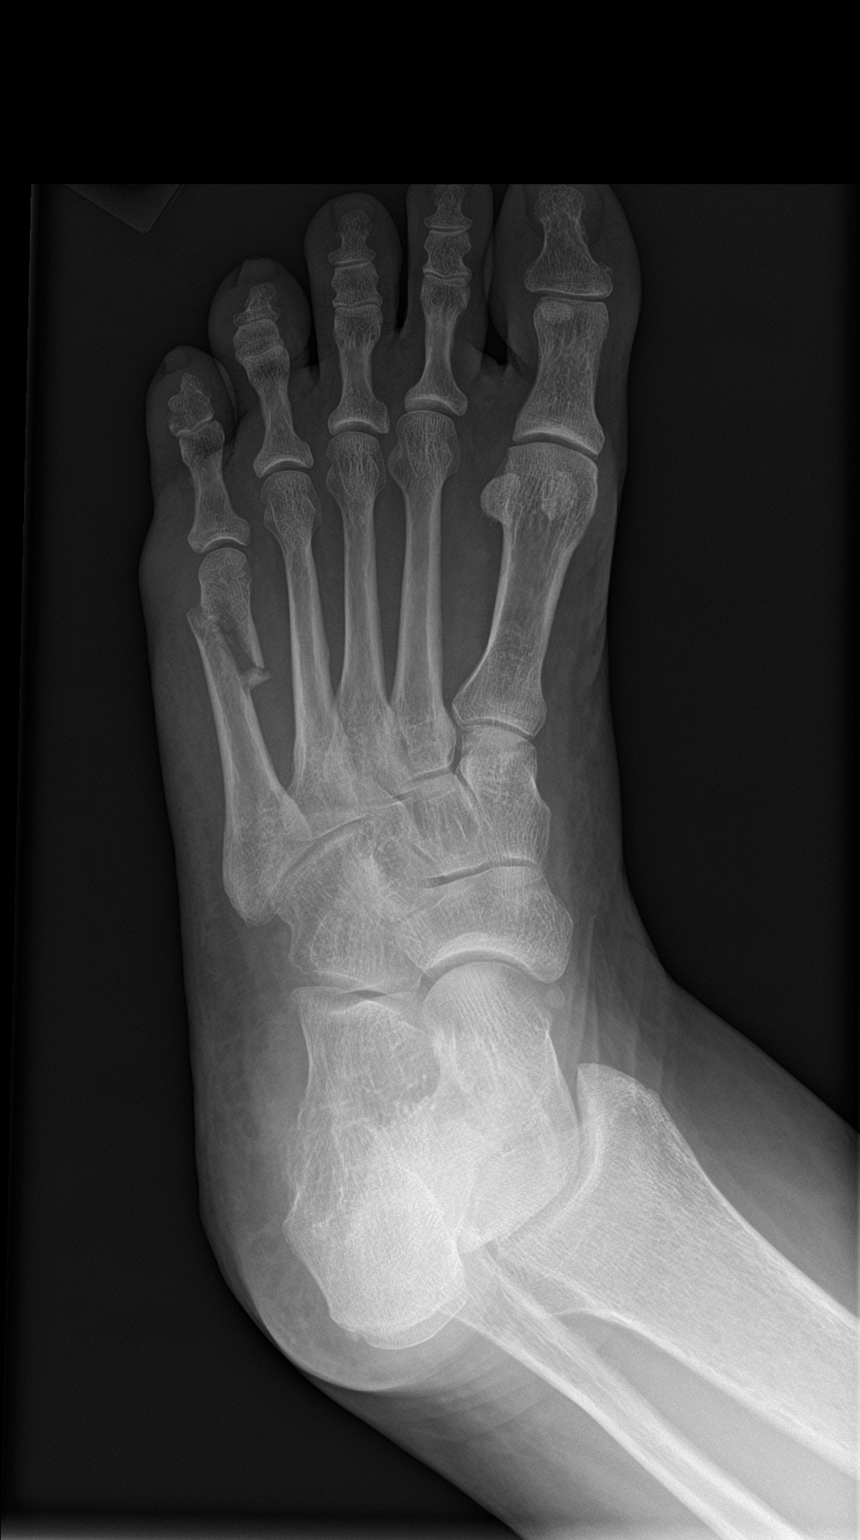

[foot lat]
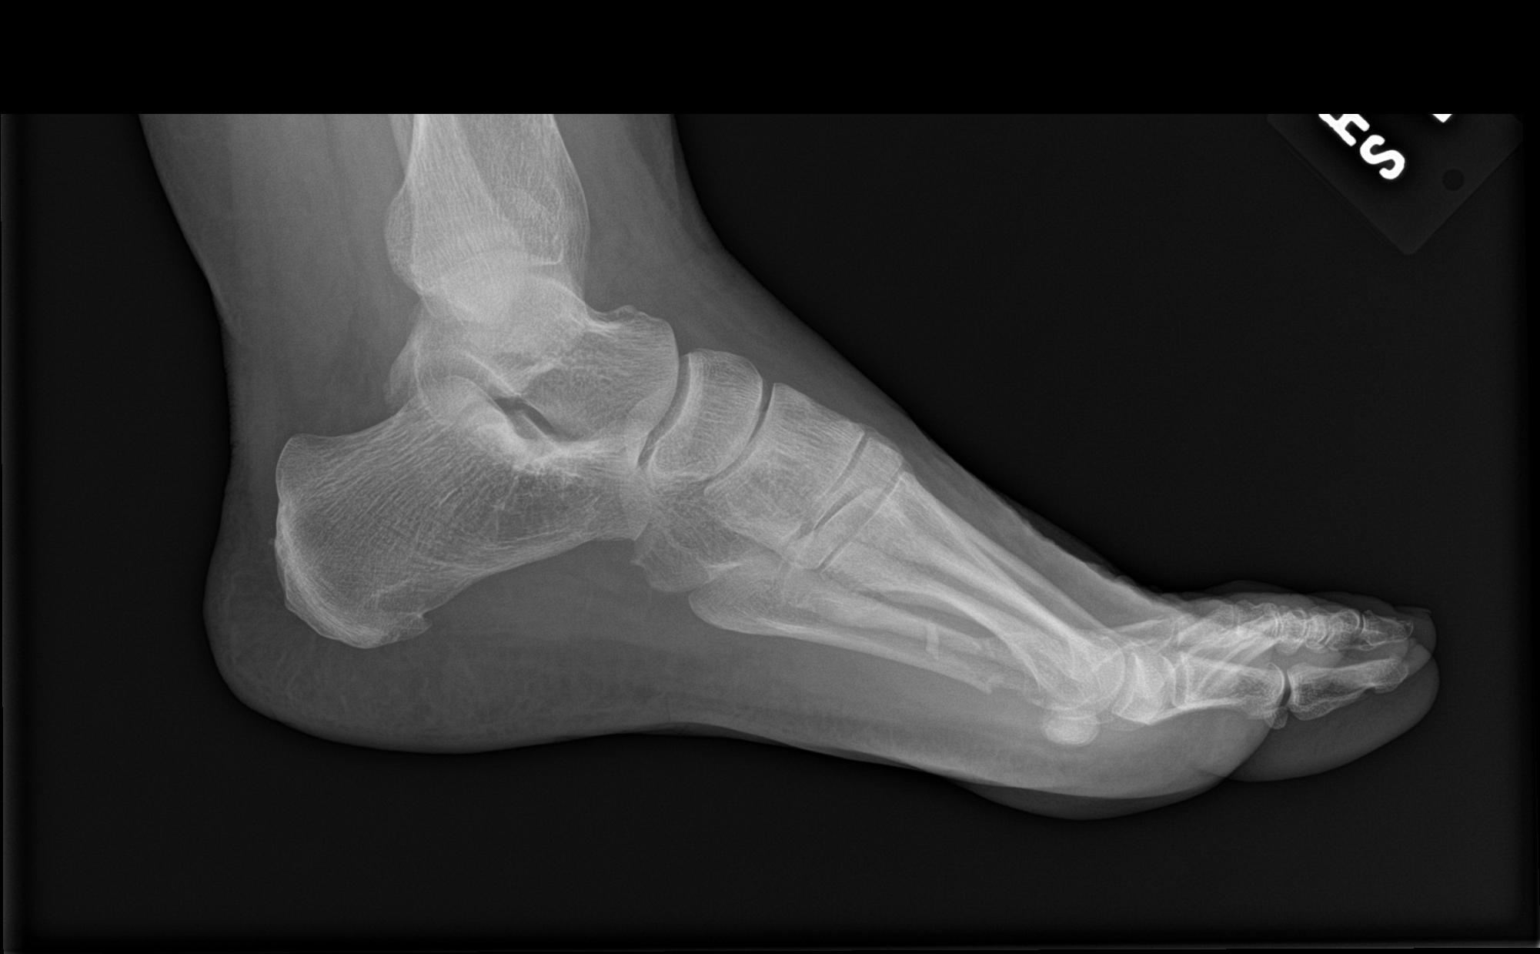

[3 of 3 positions shown; findings below may reference images not displayed]

FINDINGS: Again noted is the comminuted displaced slightly overriding fracture
of the distal shaft of the fifth metatarsal. There is slightly
increased overriding of the fracture fragments since the prior exam.

No other change.
IMPRESSION: Slight increased overriding of fracture fragments of the fifth
metatarsal since the prior study of 03/31/2017.

## 2018-01-11 MED ORDER — SODIUM CHLORIDE 0.9 % IV SOLN
INTRAVENOUS | Status: DC
Start: ? — End: 2018-01-11

## 2018-01-11 MED ORDER — GENERIC EXTERNAL MEDICATION
Status: DC
Start: ? — End: 2018-01-11

## 2018-01-18 ENCOUNTER — Encounter: Payer: Self-pay | Admitting: Family Medicine

## 2018-01-18 ENCOUNTER — Ambulatory Visit: Payer: BC Managed Care – PPO | Admitting: Family Medicine

## 2018-01-18 VITALS — BP 87/59 | HR 77 | Ht 61.0 in | Wt 137.0 lb

## 2018-01-18 DIAGNOSIS — N3000 Acute cystitis without hematuria: Secondary | ICD-10-CM | POA: Diagnosis not present

## 2018-01-18 DIAGNOSIS — K529 Noninfective gastroenteritis and colitis, unspecified: Secondary | ICD-10-CM

## 2018-01-18 DIAGNOSIS — Z7189 Other specified counseling: Secondary | ICD-10-CM

## 2018-01-18 DIAGNOSIS — Z7184 Encounter for health counseling related to travel: Secondary | ICD-10-CM

## 2018-01-18 MED ORDER — AMOXICILLIN-POT CLAVULANATE 875-125 MG PO TABS
1.0000 | ORAL_TABLET | Freq: Two times a day (BID) | ORAL | 0 refills | Status: DC
Start: 2018-01-18 — End: 2018-02-09

## 2018-01-18 MED ORDER — NITROFURANTOIN MONOHYD MACRO 100 MG PO CAPS: 100.0000 mg | ORAL_CAPSULE | Freq: Two times a day (BID) | ORAL | 0 refills | Status: DC

## 2018-01-18 NOTE — Progress Notes (Signed)
Subjective:    Patient ID: Wanda Gonzales, female    DOB: [birth year], 62 y.o.   MRN: [id number]  HPI Wanda Gonzales is a 62 year old female who was recently seen at the emergency department at [facility] on April 24 for complaints of chest pain and flulike symptoms.  She also had diarrhea at the time but had improved by the time she went to the ED.  They did a chest x-ray which was negative and then did a CT angiogram which ruled out pulmonary embolism and aortic dissection.  They did note an old compression fracture at T11.  They felt that her chest pain was likely gastritis and acute gastroenteritis related so they started her on omeprazole 40 mg daily.  She is felt much better.  The diarrhea has completely resolved and she feels back to her old self.  She has had some dysuria on and off for a couple weeks and says that they did a urine while she was there and told her that she had a little bit of bacteria.  That they did not treat her with any antibiotics.  She is also getting ready to travel to [place] in about 3 to 4 weeks for an extended vacation and wants to know if she could have something to take with her in case she gets sick.   Review of Systems  BP (!) 87/59   Pulse 77   Ht 5\' 1"  (1.549 m)   Wt 137 lb (62.1 kg)   LMP 12/18/2013   SpO2 100%   BMI 25.89 kg/m     Allergies  Allergen Reactions  . Codeine Other (See Comments)    REACTION: nausea and dizzy  . Doxycycline Nausea Only  . Concerta [Methylphenidate] Hives and Rash  . Sulfa Antibiotics Nausea Only    Past Medical History:  Diagnosis Date  . Abnormal Pap smear, atypical squamous cells of undetermined sign (ASC-US) 1998   treated with cryo, CIN I  . ADD (attention deficit disorder) 02-23-12   easily distracted.  . Arthritis 02-23-12   Rt. hip and lt. arm  . Kidney calculi 07/2015  . Kidney stone   . PONV (postoperative nausea and vomiting) 02-23-12   always has PONV  . Post-nasal drainage 02-23-12   frequent a problem,causes  a cough and mucus buildup in throat  . Urethral polyp 1998   Dr. Hartley Barefoot  . Varicose veins   . Venous insufficiency    chronic-denies any problems    Past Surgical History:  Procedure Laterality Date  . blood clot removed Left 01/2012   blood clot removed from left hand   . BREAST BIOPSY Right   . BREAST SURGERY  02-23-12   Rt. needle biopsy, recent -negative  path showed fibroadenoma  . COLONOSCOPY  08/2003   repeat 5 yrs.  . COLONOSCOPY W/ BIOPSIES  12/2008   1 polyp recheck 5 yrs  . EXCISION/RELEASE BURSA HIP  02/29/12  . HAND SURGERY  02-23-12   "blood clot" excised palm left hand 01-18-12  . KNEE ARTHROSCOPY W/ MENISCAL REPAIR  10/2012  . KNEE ARTHROSCOPY WITH EXCISION Wanda'S CYST Left 10/2012   meniscal repair  . ORIF METATARSAL FRACTURE Left 04/07/2017   Left 5th Metatarsal  . SHOULDER SURGERY Right 02/2015   Dr. Lennette Bihari [facility]  . 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 Marital status: Married  Spouse name: Not on file  . Number of children: Not on file  . Years of education: Not on file  . Highest education level: Not on file  Occupational History  . Not on file  Social Needs  . Financial resource strain: Not on file  . Food insecurity:    Worry: Not on file    Inability: Not on file  . Transportation needs:    Medical: Not on file    Non-medical: Not on file  Tobacco Use  . Smoking status: Never Smoker  . Smokeless tobacco: Never Used  Substance and Sexual Activity  . Alcohol use: Yes    Alcohol/week: 0.6 oz    Types: 1 Glasses of wine per week    Comment: rare occ.  . Drug use: No  . Sexual activity: Yes    Birth control/protection: Post-menopausal  Lifestyle  . Physical activity:    Days per week: Not on file    Minutes per session: Not on file  . Stress: Not on file  Relationships  . Social connections:    Talks on phone: Not on file     Gets together: Not on file    Attends religious service: Not on file    Active member of club or organization: Not on file    Attends meetings of clubs or organizations: Not on file    Relationship status: Not on file  . Intimate partner violence:    Fear of current or ex partner: Not on file    Emotionally abused: Not on file    Physically abused: Not on file    Forced sexual activity: Not on file  Other Topics Concern  . 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    Family History  Problem Relation Age of Onset  . Breast cancer Mother 15       breast  . Hyperlipidemia Mother   . Brain cancer Father 29       brain  . Osteoporosis Maternal Grandmother   . Drug abuse Brother     Outpatient Encounter Medications as of 01/18/2018  Medication Sig  . Diclofenac Sodium 2 % SOLN Place 2 sprays onto the skin 2 (two) times daily.  . fish oil-omega-3 fatty acids 1000 MG capsule Take 4 g by mouth daily.   . Multiple Vitamin (MULTIVITAMIN) tablet Take 1 tablet by mouth daily.   Marland Kitchen omeprazole (PRILOSEC) 40 MG capsule Take 40 mg by mouth daily.  . Sodium Hyaluronate (SUPARTZ) 25 MG/2.5ML SOSY Injected intra-articular in left knee weekly for 5 weeks Dx: Primary osteoarthritis of left knee (M17.12)  . traMADol (ULTRAM) 50 MG tablet 1 tabs by mouth Q8 hours, maximum 6 tabs per day.  . [DISCONTINUED] OVER THE COUNTER MEDICATION Take 2 tablets by mouth daily with breakfast. Vitamin D gummie  . amoxicillin-clavulanate (AUGMENTIN) 875-125 MG tablet Take 1 tablet by mouth 2 (two) times daily.  . nitrofurantoin, macrocrystal-monohydrate, (MACROBID) 100 MG capsule Take 1 capsule (100 mg total) by mouth 2 (two) times daily.  . [DISCONTINUED] cholecalciferol (VITAMIN D) 1000 units tablet daily.   No facility-administered encounter medications on file as of 01/18/2018.          Objective:   Physical Exam  Constitutional: She is oriented to  person, place, and time. She appears well-developed and well-nourished.  HENT:  Head: Normocephalic and atraumatic.  Cardiovascular: Normal rate, regular rhythm and normal heart sounds.  Pulmonary/Chest:  Effort normal and breath sounds normal.  Neurological: She is alert and oriented to person, place, and time.  Skin: Skin is warm and dry.  Psychiatric: She has a normal mood and affect. Her behavior is normal.        Assessment & Plan:  Gastritis - she is feeling much better.  She is back to baseline and no longer having any chest pain.  If she starts to experience chest pain again especially while she is feeling well then please let me know.  UTI - will tx with nitrofurantoin.  Call if not significantly better in 5 days.  If she is not improving then will collect urine culture for confirmation.  The urinalysis done at [place] did show some bacteria as well as some trace leukocytes and some blood.  That she says she has blood chronically in the urine.  Travel advice-I did give her prescription for Augmentin to take with her on her trip to [place] which can be used for sinus infection or possible UTI if needed.

## 2018-02-09 ENCOUNTER — Encounter: Payer: Self-pay | Admitting: Sports Medicine

## 2018-02-09 ENCOUNTER — Ambulatory Visit: Payer: BC Managed Care – PPO | Admitting: Sports Medicine

## 2018-02-09 DIAGNOSIS — M1712 Unilateral primary osteoarthritis, left knee: Secondary | ICD-10-CM | POA: Diagnosis not present

## 2018-02-09 MED ORDER — TRAMADOL HCL 50 MG PO TABS
ORAL_TABLET | ORAL | 0 refills | Status: DC
Start: 2018-02-09 — End: 2019-09-16

## 2018-02-09 NOTE — Progress Notes (Signed)
Subjective:    CC: Left knee pain  HPI: This is a pleasant 62 year old female with knee osteoarthritis, we finished a series of Supartz last month, having a bit of increasing pain but unfortunately she does have a trip coming up to [place] and [place] next week.  Pain is moderate, persistent, anterolateral joint line.  No mechanical symptoms.  Tramadol does provide good relief as well.   I reviewed the past medical history, family history, social history, surgical history, and allergies today and no changes were needed.  Please see the problem list section below in epic for further details.  Past Medical History: Past Medical History:  Diagnosis Date  . Abnormal Pap smear, atypical squamous cells of undetermined sign (ASC-US) 1998   treated with cryo, CIN I  . ADD (attention deficit disorder) 02-23-12   easily distracted.  . Arthritis 02-23-12   Rt. hip and lt. arm  . Kidney calculi 07/2015  . Kidney stone   . PONV (postoperative nausea and vomiting) 02-23-12   always has PONV  . Post-nasal drainage 02-23-12   frequent a problem,causes a cough and mucus buildup in throat  . Urethral polyp 1998   Dr. Hartley Barefoot  . Varicose veins   . Venous insufficiency    chronic-denies any problems   Past Surgical History: Past Surgical History:  Procedure Laterality Date  . blood clot removed Left 01/2012   blood clot removed from left hand   . BREAST BIOPSY Right   . BREAST SURGERY  02-23-12   Rt. needle biopsy, recent -negative  path showed fibroadenoma  . COLONOSCOPY  08/2003   repeat 5 yrs.  . COLONOSCOPY W/ BIOPSIES  12/2008   1 polyp recheck 5 yrs  . EXCISION/RELEASE BURSA HIP  02/29/12  . HAND SURGERY  02-23-12   "blood clot" excised palm left hand 01-18-12  . KNEE ARTHROSCOPY W/ MENISCAL REPAIR  10/2012  . KNEE ARTHROSCOPY WITH EXCISION BAKER'S CYST Left 10/2012   meniscal repair  . ORIF METATARSAL FRACTURE Left 04/07/2017   Left 5th Metatarsal  . SHOULDER SURGERY Right 02/2015   Dr. Lennette Bihari  [facility]  . 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 Marital status: Married    Spouse name: Not on file  . Number of children: Not on file  . Years of education: Not on file  . Highest education level: Not on file  Occupational History  . Not on file  Social Needs  . Financial resource strain: Not on file  . Food insecurity:    Worry: Not on file    Inability: Not on file  . Transportation needs:    Medical: Not on file    Non-medical: Not on file  Tobacco Use  . Smoking status: Never Smoker  . Smokeless tobacco: Never Used  Substance and Sexual Activity  . Alcohol use: Yes    Alcohol/week: 0.6 oz    Types: 1 Glasses of wine per week    Comment: rare occ.  . Drug use: No  . Sexual activity: Yes    Birth control/protection: Post-menopausal  Lifestyle  . Physical activity:    Days per week: Not on file    Minutes per session: Not on file  . Stress: Not on file  Relationships  . Social connections:    Talks on phone: Not on file    Gets together: Not on  file    Attends religious service: Not on file    Active member of club or organization: Not on file    Attends meetings of clubs or organizations: Not on file    Relationship status: Not on file  Other Topics Concern  . 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   Family History: Family History  Problem Relation Age of Onset  . Breast cancer Mother 11       breast  . Hyperlipidemia Mother   . Brain cancer Father 47       brain  . Osteoporosis Maternal Grandmother   . Drug abuse Brother    Allergies: Allergies  Allergen Reactions  . Codeine Other (See Comments)    REACTION: nausea and dizzy  . Doxycycline Nausea Only  . Concerta [Methylphenidate] Hives and Rash  . Sulfa Antibiotics Nausea Only   Medications: See med  rec.  Review of Systems: No fevers, chills, night sweats, weight loss, chest pain, or shortness of breath.   Objective:    General: Well Developed, well nourished, and in no acute distress.  Neuro: Alert and oriented x3, extra-ocular muscles intact, sensation grossly intact.  HEENT: Normocephalic, atraumatic, pupils equal round reactive to light, neck supple, no masses, no lymphadenopathy, thyroid nonpalpable.  Skin: Warm and dry, no rashes. Cardiac: Regular rate and rhythm, no murmurs rubs or gallops, no lower extremity edema.  Respiratory: Clear to auscultation bilaterally. Not using accessory muscles, speaking in full sentences. Left knee: Normal to inspection with no erythema or effusion or obvious bony abnormalities. No palpable Baker's cyst, tenderness at the anterolateral joint line ROM normal in flexion and extension and lower leg rotation. Ligaments with solid consistent endpoints including ACL, PCL, LCL, MCL. Negative Mcmurray's and provocative meniscal tests. Non painful patellar compression. Patellar and quadriceps tendons unremarkable. Hamstring and quadriceps strength is normal.  Procedure: Real-time Ultrasound Guided Injection of left knee Device: GE Logiq E  Verbal informed consent obtained.  Time-out conducted.  Noted no overlying erythema, induration, or other signs of local infection.  Skin prepped in a sterile fashion.  Local anesthesia: Topical Ethyl chloride.  With sterile technique and under real time ultrasound guidance: 1 cc Kenalog 40, 2 cc lidocaine, 2 cc bupivacaine injected easily Completed without difficulty  Pain immediately resolved suggesting accurate placement of the medication.  Advised to call if fevers/chills, erythema, induration, drainage, or persistent bleeding.  Images permanently stored and available for review in the ultrasound unit.  Impression: Technically successful ultrasound guided injection.  Impression and Recommendations:     Primary osteoarthritis of left knee Slight improvement after a series of Supartz but she really does need a knee replacement. She does have a trip coming up to [place] next week, I am going to give her a steroid injection to get her some relief during the trip, and I am refilling her tramadol. Reaction knee brace for the time being and to give her some support and confidence in the knee during her trip. She also has an appointment with Dr. Maureen Ralphs with orthopedic surgery for discussion of knee replacement is coming up in June. ___________________________________________ Gwen Her. Dianah Field, M.D., ABFM., CAQSM. Primary Care and [place] Instructor of [place] [facility] [employer]

## 2018-02-09 NOTE — Assessment & Plan Note (Addendum)
Slight improvement after a series of Supartz but she really does need a knee replacement. She does have a trip coming up to [place] next week, I am going to give her a steroid injection to get her some relief during the trip, and I am refilling her tramadol. Reaction knee brace for the time being and to give her some support and confidence in the knee during her trip. She also has an appointment with Dr. Maureen Ralphs with orthopedic surgery for discussion of knee replacement is coming up in June.

## 2018-04-08 NOTE — Progress Notes (Addendum)
Subjective:    CC: Pre- Op  HPI:  62 yo female is here for Pre-op clearance for Left knee replacement with Dr. Hector Shade.  She has been exercising and even went to a physical therapy appointment at the time so they could give her some stretches and exercises to do.  She is also lost about 25 pounds before her surgery.  She has no chest pain, shortness of breath palpitations.  She is able to walk a couple blocks but usually has to stop because of either knee pain or back pain but not because of chest pain or shortness of breath.  She does have some difficulty with going up and down steps but again secondary to her knee.    She is not currently on aspirin or NSAIDs but does take fish oil.  No significant complications after surgery except for postop nausea.   Patient Active Problem List   Diagnosis Date Noted  . Seborrheic keratosis 11/23/2017  . Closed displaced fracture of fifth metatarsal bone of left foot 03/31/2017  . Primary osteoarthritis of left knee 08/25/2016  . Baker's cyst of knee, left 08/25/2016  . Benign paroxysmal positional vertigo 08/25/2016  . Back pain 12/16/2015  . Cervical strain 11/21/2014  . Right shoulder pain 05/15/2014  . DDD (degenerative disc disease), lumbar 02/17/2014  . Unspecified vitamin D deficiency 03/18/2013  . Trochanteric bursitis of right hip 02/29/2012  . INSOMNIA 04/13/2010  . HYPERLIPIDEMIA 09/17/2007  . CERVICAL POLYP 09/17/2007  . Attention deficit disorder 09/11/2007  . UNSPECIFIED VENOUS INSUFFICIENCY 08/29/2007    Allergies as of 04/09/2018      Reactions   Codeine Other (See Comments)   REACTION: nausea and dizzy   Doxycycline Nausea Only   Concerta [methylphenidate] Hives, Rash   Sulfa Antibiotics Nausea Only      Medication List        Accurate as of 04/08/18 10:11 PM. Always use your most recent med list.          Diclofenac Sodium 2 % Soln Place 2 sprays onto the skin 2 (two) times daily.   fish oil-omega-3  fatty acids 1000 MG capsule Take 4 g by mouth daily.   multivitamin tablet Take 1 tablet by mouth daily.   omeprazole 40 MG capsule Commonly known as:  PRILOSEC Take 40 mg by mouth daily.   traMADol 50 MG tablet Commonly known as:  ULTRAM 1 tabs by mouth Q8 hours, maximum 6 tabs per day.        LMP 12/18/2013     Allergies  Allergen Reactions  . Codeine Other (See Comments)    REACTION: nausea and dizzy  . Doxycycline Nausea Only  . Concerta [Methylphenidate] Hives and Rash  . Sulfa Antibiotics Nausea Only    Past Medical History:  Diagnosis Date  . Abnormal Pap smear, atypical squamous cells of undetermined sign (ASC-US) 1998   treated with cryo, CIN I  . ADD (attention deficit disorder) 02-23-12   easily distracted.  . Arthritis 02-23-12   Rt. hip and lt. arm  . Kidney calculi 07/2015  . Kidney stone   . PONV (postoperative nausea and vomiting) 02-23-12   always has PONV  . Post-nasal drainage 02-23-12   frequent a problem,causes a cough and mucus buildup in throat  . Urethral polyp 1998   Dr. Hartley Barefoot  . Varicose veins   . Venous insufficiency    chronic-denies any problems    Past Surgical History:  Procedure Laterality Date  . blood  clot removed Left 01/2012   blood clot removed from left hand   . BREAST BIOPSY Right   . BREAST SURGERY  02-23-12   Rt. needle biopsy, recent -negative  path showed fibroadenoma  . COLONOSCOPY  08/2003   repeat 5 yrs.  . COLONOSCOPY W/ BIOPSIES  12/2008   1 polyp recheck 5 yrs  . EXCISION/RELEASE BURSA HIP  02/29/12  . HAND SURGERY  02-23-12   "blood clot" excised palm left hand 01-18-12  . KNEE ARTHROSCOPY W/ MENISCAL REPAIR  10/2012  . KNEE ARTHROSCOPY WITH EXCISION BAKER'S CYST Left 10/2012   meniscal repair  . ORIF METATARSAL FRACTURE Left 04/07/2017   Left 5th Metatarsal  . SHOULDER SURGERY Right 02/2015   Dr. Lennette Bihari [facility] - [facility]  . TOTAL HIP ARTHROPLASTY  age 64   LT, congenital dislocation that caused advanced  arthritis    Social History   Socioeconomic History  . Marital status: Married    Spouse name: Not on file  . Number of children: Not on file  . Years of education: Not on file  . Highest education level: Not on file  Occupational History  . Not on file  Social Needs  . Financial resource strain: Not on file  . Food insecurity:    Worry: Not on file    Inability: Not on file  . Transportation needs:    Medical: Not on file    Non-medical: Not on file  Tobacco Use  . Smoking status: Never Smoker  . Smokeless tobacco: Never Used  Substance and Sexual Activity  . Alcohol use: Yes    Alcohol/week: 0.6 oz    Types: 1 Glasses of wine per week    Comment: rare occ.  . Drug use: No  . Sexual activity: Yes    Birth control/protection: Post-menopausal  Lifestyle  . Physical activity:    Days per week: Not on file    Minutes per session: Not on file  . Stress: Not on file  Relationships  . Social connections:    Talks on phone: Not on file    Gets together: Not on file    Attends religious service: Not on file    Active member of club or organization: Not on file    Attends meetings of clubs or organizations: Not on file    Relationship status: Not on file  . Intimate partner violence:    Fear of current or ex partner: Not on file    Emotionally abused: Not on file    Physically abused: Not on file    Forced sexual activity: Not on file  Other Topics Concern  . 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    Family History  Problem Relation Age of Onset  . Breast cancer Mother 11       breast  . Hyperlipidemia Mother   . Brain cancer Father 30       brain  . Osteoporosis Maternal Grandmother   . Drug abuse Brother     Outpatient Encounter Medications as of 04/09/2018  Medication Sig  . Diclofenac Sodium 2 % SOLN Place 2 sprays onto the skin 2 (two) times daily.  . fish oil-omega-3 fatty acids 1000 MG  capsule Take 4 g by mouth daily.   . Multiple Vitamin (MULTIVITAMIN) tablet Take 1 tablet by mouth daily.   Marland Kitchen omeprazole (PRILOSEC) 40 MG capsule Take 40 mg by mouth daily.  Marland Kitchen  traMADol (ULTRAM) 50 MG tablet 1 tabs by mouth Q8 hours, maximum 6 tabs per day.   No facility-administered encounter medications on file as of 04/09/2018.      Review of Systems: No fevers, chills, night sweats, weight loss, chest pain, or shortness of breath.   Objective:    .Physical Exam  Constitutional: She is oriented to person, place, and time. She appears well-developed and well-nourished.  HENT:  Head: Normocephalic and atraumatic.  Right Ear: External ear normal.  Left Ear: External ear normal.  Nose: Nose normal.  Mouth/Throat: Oropharynx is clear and moist.  TMs and canals are clear.   Eyes: Pupils are equal, round, and reactive to light. Conjunctivae and EOM are normal.  Neck: Neck supple. No thyromegaly present.  Cardiovascular: Normal rate, regular rhythm and normal heart sounds.  Pulmonary/Chest: Effort normal and breath sounds normal. She has no wheezes.  Abdominal: Soft. Bowel sounds are normal. She exhibits no mass. There is no tenderness. There is no guarding.  Lymphadenopathy:    She has no cervical adenopathy.  Neurological: She is alert and oriented to person, place, and time.  Skin: Skin is warm and dry.  Psychiatric: She has a normal mood and affect. Her behavior is normal. Thought content normal.      Impression and Recommendations:    Pre-Op Clearance -she is cleared for left knee replacement.  She is overall low cardiac risk.  Did remind her to stop any blood thinners such as NSAIDs and her fish oil that she is currently taking a week before surgery.  We will send information over to Dr. Reynaldo Minium. Will check Vitamin D level as well.   EKG shows rate of 67 bpm, normal sinus rhythm with no ST-T wave changes.  Have low voltage QRS overall.

## 2018-04-09 ENCOUNTER — Ambulatory Visit: Payer: BC Managed Care – PPO | Admitting: Family Medicine

## 2018-04-09 ENCOUNTER — Encounter: Payer: Self-pay | Admitting: Family Medicine

## 2018-04-09 VITALS — BP 79/55 | HR 66 | Wt 133.0 lb

## 2018-04-09 DIAGNOSIS — Z01818 Encounter for other preprocedural examination: Secondary | ICD-10-CM

## 2018-04-09 DIAGNOSIS — M1712 Unilateral primary osteoarthritis, left knee: Secondary | ICD-10-CM

## 2018-04-10 LAB — LIPID PANEL
Cholesterol: 213 mg/dL — ABNORMAL HIGH (ref <200)
HDL: 56 mg/dL (ref >50)
LDL Cholesterol (Calc): 136 mg/dL (calc) — ABNORMAL HIGH
Non-HDL Cholesterol (Calc): 157 mg/dL (calc) — ABNORMAL HIGH (ref <130)
Total CHOL/HDL Ratio: 3.8 (calc) (ref <5.0)
Triglycerides: 105 mg/dL (ref <150)

## 2018-04-10 LAB — COMPLETE METABOLIC PANEL WITH GFR
AG Ratio: 1.7 (calc) (ref 1.0–2.5)
ALT: 15 U/L (ref 6–29)
AST: 22 U/L (ref 10–35)
Albumin: 4.3 g/dL (ref 3.6–5.1)
Alkaline phosphatase (APISO): 81 U/L (ref 33–130)
BUN: 23 mg/dL (ref 7–25)
CO2: 30 mmol/L (ref 20–32)
Calcium: 9.7 mg/dL (ref 8.6–10.4)
Chloride: 106 mmol/L (ref 98–110)
Creat: 0.9 mg/dL (ref 0.50–0.99)
GFR, Est African American: 80 mL/min/{1.73_m2} (ref >=60)
GFR, Est Non African American: 69 mL/min/{1.73_m2} (ref >=60)
Globulin: 2.5 g/dL (calc) (ref 1.9–3.7)
Glucose, Bld: 87 mg/dL (ref 65–99)
Potassium: 4.2 mmol/L (ref 3.5–5.3)
Sodium: 140 mmol/L (ref 135–146)
Total Bilirubin: 0.6 mg/dL (ref 0.2–1.2)
Total Protein: 6.8 g/dL (ref 6.1–8.1)

## 2018-04-10 LAB — CBC
HCT: 42.3 % (ref 35.0–45.0)
Hemoglobin: 13.7 g/dL (ref 11.7–15.5)
MCH: 29.8 pg (ref 27.0–33.0)
MCHC: 32.4 g/dL (ref 32.0–36.0)
MCV: 92.2 fL (ref 80.0–100.0)
MPV: 11 fL (ref 7.5–12.5)
Platelets: 269 10*3/uL (ref 140–400)
RBC: 4.59 10*6/uL (ref 3.80–5.10)
RDW: 12.3 % (ref 11.0–15.0)
WBC: 5.7 10*3/uL (ref 3.8–10.8)

## 2018-04-10 LAB — TSH: TSH: 2.34 mIU/L (ref 0.40–4.50)

## 2018-04-10 LAB — VITAMIN D 25 HYDROXY (VIT D DEFICIENCY, FRACTURES): Vit D, 25-Hydroxy: 34 ng/mL (ref 30–100)

## 2018-04-24 ENCOUNTER — Telehealth: Payer: Self-pay | Admitting: *Deleted

## 2018-04-24 NOTE — Telephone Encounter (Signed)
Pre op form completed, faxed and scanned into chart, confirmation received .Marland KitchenElouise Munroe, [place]

## 2018-04-27 ENCOUNTER — Encounter: Payer: Self-pay | Admitting: Emergency Medicine

## 2018-04-27 ENCOUNTER — Emergency Department
Admission: EM | Admit: 2018-04-27 | Discharge: 2018-04-27 | Disposition: A | Discharge status: Home / Self Care | Payer: BC Managed Care – PPO | Source: Home / Self Care | Attending: Family Medicine | Admitting: Family Medicine

## 2018-04-27 ENCOUNTER — Other Ambulatory Visit: Payer: Self-pay

## 2018-04-27 DIAGNOSIS — R300 Dysuria: Secondary | ICD-10-CM | POA: Diagnosis not present

## 2018-04-27 LAB — POCT URINALYSIS DIP (MANUAL ENTRY)
Bilirubin, UA: NEGATIVE
Blood, UA: NEGATIVE
Glucose, UA: NEGATIVE mg/dL
Ketones, POC UA: NEGATIVE mg/dL
Nitrite, UA: NEGATIVE
Protein Ur, POC: NEGATIVE mg/dL
Spec Grav, UA: 1.015 (ref 1.010–1.025)
Urobilinogen, UA: 0.2 E.U./dL
pH, UA: 5.5 (ref 5.0–8.0)

## 2018-04-27 NOTE — ED Triage Notes (Signed)
Pt c/o pressure and frequency. States she had Macrobid script that she began taking with little to no relief. States she is having left knee replacement on Aug 20th.

## 2018-04-27 NOTE — ED Provider Notes (Signed)
[facility]    CSN: [id number] Arrival date & time: 04/27/18  1408     History   Chief Complaint Chief Complaint  Patient presents with  . Dysuria    HPI Wanda Gonzales is a 62 y.o. female.   HPI Wanda Gonzales is a 62 y.o. female presenting to UC with c/o urinary frequency pressure and mild dysuria for the last 2 days.  She started taking Macrobid, which she had a full prescription on hand due to going out of town with urinary symptoms a few months ago.  She has taken 2 of the 5 days of pills. Minimal relief. She has knee replacement surgery scheduled for August 20th and needs the infection gone in order for them to operate. Denies fever, chills, abdominal pain or back pain.   Past Medical History:  Diagnosis Date  . Abnormal Pap smear, atypical squamous cells of undetermined sign (ASC-US) 1998   treated with cryo, CIN I  . ADD (attention deficit disorder) 02-23-12   easily distracted.  . Arthritis 02-23-12   Rt. hip and lt. arm  . Kidney calculi 07/2015  . Kidney stone   . PONV (postoperative nausea and vomiting) 02-23-12   always has PONV  . Post-nasal drainage 02-23-12   frequent a problem,causes a cough and mucus buildup in throat  . Urethral polyp 1998   Dr. Hartley Barefoot  . Varicose veins   . Venous insufficiency    chronic-denies any problems    Patient Active Problem List   Diagnosis Date Noted  . Seborrheic keratosis 11/23/2017  . Closed displaced fracture of fifth metatarsal bone of left foot 03/31/2017  . Primary osteoarthritis of left knee 08/25/2016  . Baker's cyst of knee, left 08/25/2016  . Benign paroxysmal positional vertigo 08/25/2016  . Back pain 12/16/2015  . Cervical strain 11/21/2014  . Right shoulder pain 05/15/2014  . DDD (degenerative disc disease), lumbar 02/17/2014  . Unspecified vitamin D deficiency 03/18/2013  . Trochanteric bursitis of right hip 02/29/2012  . INSOMNIA 04/13/2010  . HYPERLIPIDEMIA 09/17/2007  . CERVICAL POLYP  09/17/2007  . Attention deficit disorder 09/11/2007  . UNSPECIFIED VENOUS INSUFFICIENCY 08/29/2007    Past Surgical History:  Procedure Laterality Date  . blood clot removed Left 01/2012   blood clot removed from left hand   . BREAST BIOPSY Right   . BREAST SURGERY  02-23-12   Rt. needle biopsy, recent -negative  path showed fibroadenoma  . COLONOSCOPY  08/2003   repeat 5 yrs.  . COLONOSCOPY W/ BIOPSIES  12/2008   1 polyp recheck 5 yrs  . EXCISION/RELEASE BURSA HIP  02/29/12  . HAND SURGERY  02-23-12   "blood clot" excised palm left hand 01-18-12  . KNEE ARTHROSCOPY W/ MENISCAL REPAIR  10/2012  . KNEE ARTHROSCOPY WITH EXCISION BAKER'S CYST Left 10/2012   meniscal repair  . ORIF METATARSAL FRACTURE Left 04/07/2017   Left 5th Metatarsal  . SHOULDER SURGERY Right 02/2015   Dr. Lennette Bihari Supple - [facility]  . 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 Authorizing Provider  fish oil-omega-3 fatty acids 1000 MG capsule Take 4 g by mouth daily.     [provider]  FLUoxetine (PROZAC) 20 MG capsule Take 20 ng by mouth daily.    [provider]  Multiple Vitamin (MULTIVITAMIN) tablet Take 1 tablet by mouth daily.     [provider]  omeprazole (PRILOSEC) 40 MG capsule Take 40 mg by mouth daily. 01/10/18 01/24/18  [provider]  traMADol (ULTRAM) 50 MG tablet 1 tabs by mouth Q8 hours, maximum 6 tabs per day. 02/09/18   Silverio Decamp, MD    Family History Family History  Problem Relation Age of Onset  . Breast cancer Mother 59       breast  . Hyperlipidemia Mother   . Brain cancer Father 34       brain  . Osteoporosis Maternal  Grandmother   . Drug abuse Brother     Social History Social History   Tobacco Use  . Smoking status: Never Smoker  . Smokeless tobacco: Never Used  Substance Use Topics  . Alcohol use: Yes    Alcohol/week: 1.0 standard drinks    Types: 1 Glasses of wine per week    Comment: rare occ.  . Drug use: No     Allergies   Codeine; Doxycycline; Concerta [methylphenidate]; and Sulfa antibiotics   Review of Systems Review of Systems  Constitutional: Negative for chills and fever.  Genitourinary: Positive for dysuria and frequency. Negative for flank pain, hematuria, pelvic pain and urgency.  Musculoskeletal: Negative for back pain and myalgias.     Physical Exam Triage Vital Signs ED Triage Vitals  Enc Vitals Group     BP 04/27/18 1428 92/63     Pulse Rate 04/27/18 1428 70     Resp --      Temp 04/27/18 1428 98.2 F (36.8 C)     Temp Source 04/27/18 1428 Oral     SpO2 04/27/18 1428 99 %     Weight 04/27/18 1429 132 lb (59.9 kg)     Height 04/27/18 1429 5' (1.524 m)     Head Circumference --      Peak Flow --      Pain Score 04/27/18 1429 0     Pain Loc --      Pain Edu? --      Excl. in [place]? --    No data found.  Updated Vital Signs BP 92/63 (BP Location: Right Arm)   Pulse 70   Temp 98.2 F (36.8 C) (Oral)   Ht 5' (1.524 m)   Wt 132 lb (59.9 kg)   LMP 12/18/2013   SpO2 99%   BMI 25.78 kg/m   Visual Acuity Right Eye Distance:   Left Eye Distance:   Bilateral Distance:    Right Eye Near:   Left Eye Near:    Bilateral Near:     Physical Exam  Constitutional: She is oriented to person, place, and time. She appears well-developed and well-nourished.  HENT:  Head: Normocephalic and atraumatic.  Mouth/Throat: Oropharynx is clear and moist.  Eyes: EOM are normal.  Neck: Normal range of motion.  Cardiovascular: Normal rate and regular rhythm.  Pulmonary/Chest: Effort normal and breath sounds normal. No stridor. No respiratory distress. She has no  wheezes. She has no rales.  Abdominal: Soft. She exhibits no distension. There is no tenderness. There is no CVA tenderness.  Musculoskeletal: Normal range of motion.  Neurological: She is alert and oriented to person, place, and time.  Skin: Skin is warm and dry.  Psychiatric: She has a normal mood and affect. Her behavior is normal.  Nursing note and vitals reviewed.    UC Treatments / Results  Labs (all labs ordered are listed, but only abnormal results are displayed) Labs Reviewed  POCT URINALYSIS DIP (MANUAL ENTRY) - Abnormal; Notable for the following components:      Result Value   Leukocytes, UA Trace (*)    All other components within normal limits  URINE CULTURE    EKG None  Radiology No results found.  Procedures Procedures (including critical care time)  Medications Ordered in UC Medications - No data to display  Initial Impression / Assessment and Plan / UC Course  I have reviewed the triage vital signs and the nursing notes.  Pertinent labs & imaging results that were available during my care of the patient were reviewed by me and considered in my medical decision making (see chart for details).     UA: not concerning for definite UTI, Urine culture sent In meantime, pt can continue to finish the whole course of macrobid she already started. Pt will be notified of culture results and if change in medication is indicated.   Final Clinical Impressions(s) / UC Diagnoses   Final diagnoses:  Dysuria   Discharge Instructions   None    ED Prescriptions    None     Controlled Substance Prescriptions [place] Controlled Substance Registry consulted? Not Applicable   Tyrell Antonio 04/27/18 1455

## 2018-04-29 ENCOUNTER — Telehealth: Payer: Self-pay

## 2018-04-29 LAB — URINE CULTURE
MICRO NUMBER:: 90945987
Result:: NO GROWTH
SPECIMEN QUALITY:: ADEQUATE

## 2018-04-29 NOTE — Telephone Encounter (Signed)
Pt informed of neg UCX. Pt now concerned about yeast infection. Advised pt to f/u in UC or with family doctor.

## 2018-04-30 ENCOUNTER — Ambulatory Visit (INDEPENDENT_AMBULATORY_CARE_PROVIDER_SITE_OTHER): Payer: BC Managed Care – PPO | Admitting: Family Medicine

## 2018-04-30 ENCOUNTER — Telehealth: Payer: Self-pay

## 2018-04-30 VITALS — BP 93/52 | HR 80 | Temp 98.6°F | Resp 16 | Wt 132.0 lb

## 2018-04-30 DIAGNOSIS — R300 Dysuria: Secondary | ICD-10-CM | POA: Diagnosis not present

## 2018-04-30 DIAGNOSIS — R102 Pelvic and perineal pain: Secondary | ICD-10-CM | POA: Diagnosis not present

## 2018-04-30 NOTE — Progress Notes (Signed)
Subjective:    Patient ID: Wanda Gonzales, female    DOB: 04-14-1956, 62 y.o.   MRN: [id number]  HPI  62 year old female comes in today complaining of burning with urination.  In fact her symptoms had started previously and she was unable to get into her office so she went to urgent care.  They went ahead and treated her with Macrobid and she did notice some improvement.  She completed her Macrobid today but feels like she is having more burning more on the outside and not just burning with urination.  She does report that her frequency and urgency is better since taking the Macrobid.  No fevers chills or sweats.  She has not had any excess vaginal discharge etc.  Burning with urination-repeat urinalysis  Review of Systems  BP (!) 93/52   Pulse 80   Temp 98.6 F (37 C) (Oral)   Resp 16   Wt 132 lb (59.9 kg)   LMP 12/18/2013   SpO2 98%   BMI 25.78 kg/m     Allergies  Allergen Reactions  . Codeine Other (See Comments)    REACTION: nausea and dizzy  . Doxycycline Nausea Only  . Concerta [Methylphenidate] Hives and Rash  . Sulfa Antibiotics Nausea Only    Past Medical History:  Diagnosis Date  . Abnormal Pap smear, atypical squamous cells of undetermined sign (ASC-US) 1998   treated with cryo, CIN I  . ADD (attention deficit disorder) 02-23-12   easily distracted.  . Arthritis 02-23-12   Rt. hip and lt. arm  . Kidney calculi 07/2015  . Kidney stone   . PONV (postoperative nausea and vomiting) 02-23-12   always has PONV  . Post-nasal drainage 02-23-12   frequent a problem,causes a cough and mucus buildup in throat  . Urethral polyp 1998   Dr. Hartley Barefoot  . Varicose veins   . Venous insufficiency    chronic-denies any problems    Past Surgical History:  Procedure Laterality Date  . blood clot removed Left 01/2012   blood clot removed from left hand   . BREAST BIOPSY Right   . BREAST SURGERY  02-23-12   Rt. needle biopsy, recent -negative  path showed fibroadenoma  . COLONOSCOPY   08/2003   repeat 5 yrs.  . COLONOSCOPY W/ BIOPSIES  12/2008   1 polyp recheck 5 yrs  . EXCISION/RELEASE BURSA HIP  02/29/12  . HAND SURGERY  02-23-12   "blood clot" excised palm left hand 01-18-12  . KNEE ARTHROSCOPY W/ MENISCAL REPAIR  10/2012  . KNEE ARTHROSCOPY WITH EXCISION BAKER'S CYST Left 10/2012   meniscal repair  . ORIF METATARSAL FRACTURE Left 04/07/2017   Left 5th Metatarsal  . SHOULDER SURGERY Right 02/2015   Dr. Lennette Bihari Supple - [facility]  . 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 Marital status: Married    Spouse name: Not on file  . Number of children: Not on file  . Years of education: Not on file  . Highest education level: Not on file  Occupational History  . Not on file  Social Needs  . Financial resource strain: Not on file  . Food insecurity:    Worry: Not on file    Inability: Not on file  . Transportation needs:    Medical: Not on file    Non-medical: Not on file  Tobacco Use  . Smoking  status: Never Smoker  . Smokeless tobacco: Never Used  Substance and Sexual Activity  . Alcohol use: Yes    Alcohol/week: 1.0 standard drinks    Types: 1 Glasses of wine per week    Comment: rare occ.  . Drug use: No  . Sexual activity: Yes    Birth control/protection: Post-menopausal  Lifestyle  . Physical activity:    Days per week: Not on file    Minutes per session: Not on file  . Stress: Not on file  Relationships  . Social connections:    Talks on phone: Not on file    Gets together: Not on file    Attends religious service: Not on file    Active member of club or organization: Not on file    Attends meetings of clubs or organizations: Not on file    Relationship status: Not on file  . Intimate partner violence:    Fear of current or ex partner: Not on file    Emotionally abused: Not on file    Physically abused: Not on file    Forced sexual activity: Not on  file  Other Topics Concern  . 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    Family History  Problem Relation Age of Onset  . Breast cancer Mother 60       breast  . Hyperlipidemia Mother   . Brain cancer Father 31       brain  . Osteoporosis Maternal Grandmother   . Drug abuse Brother     Outpatient Encounter Medications as of 04/30/2018  Medication Sig  . fish oil-omega-3 fatty acids 1000 MG capsule Take 4 g by mouth daily.   Marland Kitchen FLUoxetine (PROZAC) 20 MG capsule Take 20 ng by mouth daily.  . Multiple Vitamin (MULTIVITAMIN) tablet Take 1 tablet by mouth daily.   . traMADol (ULTRAM) 50 MG tablet 1 tabs by mouth Q8 hours, maximum 6 tabs per day.  Marland Kitchen omeprazole (PRILOSEC) 40 MG capsule Take 40 mg by mouth daily.   No facility-administered encounter medications on file as of 04/30/2018.           Objective:   Physical Exam  Constitutional: She is oriented to person, place, and time. She appears well-developed and well-nourished.  HENT:  Head: Normocephalic and atraumatic.  Eyes: Conjunctivae and EOM are normal.  Cardiovascular: Normal rate.  Pulmonary/Chest: Effort normal.  Genitourinary: Vagina normal. Pelvic exam was performed with patient supine. There is no rash or lesion on the right labia. There is no rash or lesion on the left labia. Cervix exhibits no motion tenderness, no discharge and no friability. No erythema, tenderness or bleeding in the vagina. No vaginal discharge found.  Genitourinary Comments: Tiny polyp at the cervical opening.  Neurological: She is alert and oriented to person, place, and time.  Skin: Skin is dry. No pallor.  Psychiatric: She has a normal mood and affect. Her behavior is normal.  Vitals reviewed.     Assessment & Plan:  Burning with urination-repeat UA performed today.  Will repeat urine culture as well if she is actually scheduled for surgery next week we want to make sure  that there are no active infections.  We also did a wet prep just to rule out BV and or yeast infection.  Exam itself is benign.  We also went ahead and did a swab to rule out GC chlamydia as well.

## 2018-04-30 NOTE — Telephone Encounter (Signed)
Wanda Gonzales called and states she has vaginal burning. Denies vaginal itching, back pain, pelvic pain, fever, chills or sweats. She was seen last week in UC for UTI and treated with an antibiotic. There are no openings this week. Please advise.

## 2018-05-01 ENCOUNTER — Other Ambulatory Visit: Payer: Self-pay | Admitting: Obstetrics and Gynecology

## 2018-05-01 ENCOUNTER — Encounter: Payer: Self-pay | Admitting: Family Medicine

## 2018-05-01 DIAGNOSIS — Z1231 Encounter for screening mammogram for malignant neoplasm of breast: Secondary | ICD-10-CM

## 2018-05-01 LAB — WET PREP FOR TRICH, YEAST, CLUE
MICRO NUMBER:: 90954471
Specimen Quality: ADEQUATE

## 2018-05-01 LAB — SURESWAB CT/NG/T. VAGINALIS
C. trachomatis RNA, TMA: NOT DETECTED
N. gonorrhoeae RNA, TMA: NOT DETECTED
Trichomonas vaginalis RNA: NOT DETECTED

## 2018-05-03 LAB — URINE CULTURE
MICRO NUMBER:: 90964450
Result:: NO GROWTH
SPECIMEN QUALITY:: ADEQUATE

## 2018-05-10 ENCOUNTER — Ambulatory Visit: Payer: BC Managed Care – PPO | Admitting: Physical Therapy

## 2018-05-14 ENCOUNTER — Encounter: Payer: Self-pay | Admitting: Physical Therapy

## 2018-05-16 ENCOUNTER — Encounter: Payer: Self-pay | Admitting: Physical Therapy

## 2018-05-18 ENCOUNTER — Encounter: Payer: Self-pay | Admitting: Physical Therapy

## 2018-05-22 ENCOUNTER — Encounter: Payer: Self-pay | Admitting: Physical Therapy

## 2018-05-23 ENCOUNTER — Encounter: Payer: Self-pay | Admitting: Physical Therapy

## 2018-05-25 ENCOUNTER — Encounter: Payer: Self-pay | Admitting: Physical Therapy

## 2018-05-30 ENCOUNTER — Ambulatory Visit: Payer: BC Managed Care – PPO | Admitting: Obstetrics and Gynecology

## 2018-06-14 ENCOUNTER — Encounter: Payer: Self-pay | Admitting: Family Medicine

## 2018-06-14 ENCOUNTER — Ambulatory Visit: Payer: BC Managed Care – PPO | Admitting: Family Medicine

## 2018-06-14 VITALS — BP 89/64 | HR 103 | Temp 98.3°F | Ht 61.0 in | Wt 132.0 lb

## 2018-06-14 DIAGNOSIS — R5383 Other fatigue: Secondary | ICD-10-CM

## 2018-06-14 DIAGNOSIS — I9589 Other hypotension: Secondary | ICD-10-CM | POA: Diagnosis not present

## 2018-06-14 DIAGNOSIS — R251 Tremor, unspecified: Secondary | ICD-10-CM | POA: Diagnosis not present

## 2018-06-14 DIAGNOSIS — E861 Hypovolemia: Secondary | ICD-10-CM

## 2018-06-14 NOTE — Progress Notes (Signed)
Subjective:    Patient ID: Wanda Gonzales, female    DOB: 1956/07/12, 62 y.o.   MRN: [id number]  HPI 62 year old female comes in today complaining of low blood pressure.  She was at physical therapy several days ago and had been on the bike for about 3 minutes when she got off the bike and suddenly felt very shaky and like she was going to pass out.  She was able to sit down and drink a little bit after about 5 or 6 minutes she felt a little better.  She is just felt extremely weak and tired and she is been sleeping a lot ever since she had her left knee replacement.  She is really been struggling with her recovery.  She has not had any chest pain, shortness of breath, palpitations or heart flutters.   Review of Systems   BP (!) 89/64   Pulse (!) 103   Ht 5\' 1"  (1.549 m)   Wt 132 lb (59.9 kg)   LMP 12/18/2013   SpO2 98%   BMI 24.94 kg/m     Allergies  Allergen Reactions  . Codeine Other (See Comments)    REACTION: nausea and dizzy  . Doxycycline Nausea Only  . Concerta [Methylphenidate] Hives and Rash  . Sulfa Antibiotics Nausea Only    Past Medical History:  Diagnosis Date  . Abnormal Pap smear, atypical squamous cells of undetermined sign (ASC-US) 1998   treated with cryo, CIN I  . ADD (attention deficit disorder) 02-23-12   easily distracted.  . Arthritis 02-23-12   Rt. hip and lt. arm  . Kidney calculi 07/2015  . Kidney stone   . PONV (postoperative nausea and vomiting) 02-23-12   always has PONV  . Post-nasal drainage 02-23-12   frequent a problem,causes a cough and mucus buildup in throat  . Urethral polyp 1998   Dr. Hartley Barefoot  . Varicose veins   . Venous insufficiency    chronic-denies any problems    Past Surgical History:  Procedure Laterality Date  . blood clot removed Left 01/2012   blood clot removed from left hand   . BREAST BIOPSY Right   . BREAST SURGERY  02-23-12   Rt. needle biopsy, recent -negative  path showed fibroadenoma  . COLONOSCOPY  08/2003    repeat 5 yrs.  . COLONOSCOPY W/ BIOPSIES  12/2008   1 polyp recheck 5 yrs  . EXCISION/RELEASE BURSA HIP  02/29/12  . HAND SURGERY  02-23-12   "blood clot" excised palm left hand 01-18-12  . KNEE ARTHROSCOPY W/ MENISCAL REPAIR  10/2012  . KNEE ARTHROSCOPY WITH EXCISION BAKER'S CYST Left 10/2012   meniscal repair  . ORIF METATARSAL FRACTURE Left 04/07/2017   Left 5th Metatarsal  . SHOULDER SURGERY Right 02/2015   Dr. Lennette Bihari Supple - [facility]  . 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 Marital status: Married    Spouse name: Not on file  . Number of children: Not on file  . Years of education: Not on file  . Highest education level: Not on file  Occupational History  . Not on file  Social Needs  . Financial resource strain: Not on file  . Food insecurity:    Worry: Not on file    Inability: Not on file  . Transportation needs:    Medical: Not on file    Non-medical: Not  on file  Tobacco Use  . Smoking status: Never Smoker  . Smokeless tobacco: Never Used  Substance and Sexual Activity  . Alcohol use: Yes    Alcohol/week: 1.0 standard drinks    Types: 1 Glasses of wine per week    Comment: rare occ.  . Drug use: No  . Sexual activity: Yes    Birth control/protection: Post-menopausal  Lifestyle  . Physical activity:    Days per week: Not on file    Minutes per session: Not on file  . Stress: Not on file  Relationships  . Social connections:    Talks on phone: Not on file    Gets together: Not on file    Attends religious service: Not on file    Active member of club or organization: Not on file    Attends meetings of clubs or organizations: Not on file    Relationship status: Not on file  . Intimate partner violence:    Fear of current or ex partner: Not on file    Emotionally abused: Not on file    Physically abused: Not on file    Forced sexual activity: Not on file   Other Topics Concern  . 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    Family History  Problem Relation Age of Onset  . Breast cancer Mother 96       breast  . Hyperlipidemia Mother   . Brain cancer Father 38       brain  . Osteoporosis Maternal Grandmother   . Drug abuse Brother     Outpatient Encounter Medications as of 06/14/2018  Medication Sig  . fish oil-omega-3 fatty acids 1000 MG capsule Take 4 g by mouth daily.   Marland Kitchen FLUoxetine (PROZAC) 20 MG capsule Take 20 ng by mouth daily.  . methocarbamol (ROBAXIN) 750 MG tablet Take 1 tablet by mouth 3 (three) times daily as needed.  . Multiple Vitamin (MULTIVITAMIN) tablet Take 1 tablet by mouth daily.   . traMADol (ULTRAM) 50 MG tablet 1 tabs by mouth Q8 hours, maximum 6 tabs per day.  . [DISCONTINUED] gabapentin (NEURONTIN) 300 MG capsule 1 CAP 3 TIMES DAILY X2 WEEKS THEN 1 CAP TWICE DAILY X2 WEEKS THEN 1 CAP DAILY X2 WEEKS  . [DISCONTINUED] ondansetron (ZOFRAN) 4 MG tablet Take 4 mg by mouth every 6 (six) hours as needed.  . [DISCONTINUED] omeprazole (PRILOSEC) 40 MG capsule Take 40 mg by mouth daily.   No facility-administered encounter medications on file as of 06/14/2018.           Objective:   Physical Exam  Constitutional: She is oriented to person, place, and time. She appears well-developed and well-nourished.  HENT:  Head: Normocephalic and atraumatic.  Right Ear: External ear normal.  Left Ear: External ear normal.  Nose: Nose normal.  TMs and canals are clear.   Eyes: Pupils are equal, round, and reactive to light. Conjunctivae and EOM are normal.  Neck: Neck supple. No thyromegaly present.  Cardiovascular: Normal rate, regular rhythm and normal heart sounds.  No carotid bruits.   Pulmonary/Chest: Effort normal and breath sounds normal. She has no wheezes.  Musculoskeletal:  Trace ankle edema bilat  Lymphadenopathy:    She has no cervical  adenopathy.  Neurological: She is alert and oriented to person, place, and time.  Skin: Skin is warm and dry.  Psychiatric: She has a normal mood and affect. Her  behavior is normal.       Assessment & Plan:  Hypotension-she is not on any blood pressure medications that should be causing this.  She just takes as needed tramadol and a muscle relaxer as needed.  We discussed increasing her fluids daily in addition to liberalizing her salt intake.  We will do a CBC to check for anemia and will do a BMP just to check renal function and for electrolyte abnormality.  Fatigue - eval for anemia.  Check thyroid. May be secondary to her low BP.

## 2018-06-14 NOTE — Patient Instructions (Signed)
Dehydration, Adult Dehydration is a condition in which there is not enough fluid or water in the body. This happens when you lose more fluids than you take in. Important organs, such as the kidneys, brain, and heart, cannot function without a proper amount of fluids. Any loss of fluids from the body can lead to dehydration. Dehydration can range from mild to severe. This condition should be treated right away to prevent it from becoming severe. What are the causes? This condition may be caused by:  Vomiting.  Diarrhea.  Excessive sweating, such as from heat exposure or exercise.  Not drinking enough fluid, especially: ? When ill. ? While doing activity that requires a lot of energy.  Excessive urination.  Fever.  Infection.  Certain medicines, such as medicines that cause the body to lose excess fluid (diuretics).  Inability to access safe drinking water.  Reduced physical ability to get adequate water and food.  What increases the risk? This condition is more likely to develop in people:  Who have a poorly controlled long-term (chronic) illness, such as diabetes, heart disease, or kidney disease.  Who are age 65 or older.  Who are disabled.  Who live in a place with high altitude.  Who play endurance sports.  What are the signs or symptoms? Symptoms of mild dehydration may include:  Thirst.  Dry lips.  Slightly dry mouth.  Dry, warm skin.  Dizziness. Symptoms of moderate dehydration may include:  Very dry mouth.  Muscle cramps.  Dark urine. Urine may be the color of tea.  Decreased urine production.  Decreased tear production.  Heartbeat that is irregular or faster than normal (palpitations).  Headache.  Light-headedness, especially when you stand up from a sitting position.  Fainting (syncope). Symptoms of severe dehydration may include:  Changes in skin, such as: ? Cold and clammy skin. ? Blotchy (mottled) or pale skin. ? Skin that does  not quickly return to normal after being lightly pinched and released (poor skin turgor).  Changes in body fluids, such as: ? Extreme thirst. ? No tear production. ? Inability to sweat when body temperature is high, such as in hot weather. ? Very little urine production.  Changes in vital signs, such as: ? Weak pulse. ? Pulse that is more than 100 beats a minute when sitting still. ? Rapid breathing. ? Low blood pressure.  Other changes, such as: ? Sunken eyes. ? Cold hands and feet. ? Confusion. ? Lack of energy (lethargy). ? Difficulty waking up from sleep. ? Short-term weight loss. ? Unconsciousness. How is this diagnosed? This condition is diagnosed based on your symptoms and a physical exam. Blood and urine tests may be done to help confirm the diagnosis. How is this treated? Treatment for this condition depends on the severity. Mild or moderate dehydration can often be treated at home. Treatment should be started right away. Do not wait until dehydration becomes severe. Severe dehydration is an emergency and it needs to be treated in a hospital. Treatment for mild dehydration may include:  Drinking more fluids.  Replacing salts and minerals in your blood (electrolytes) that you may have lost. Treatment for moderate dehydration may include:  Drinking an oral rehydration solution (ORS). This is a drink that helps you replace fluids and electrolytes (rehydrate). It can be found at pharmacies and retail stores. Treatment for severe dehydration may include:  Receiving fluids through an IV tube.  Receiving an electrolyte solution through a feeding tube that is passed through your nose   and into your stomach (nasogastric tube, or NG tube).  Correcting any abnormalities in electrolytes.  Treating the underlying cause of dehydration. Follow these instructions at home:  If directed by your health care provider, drink an ORS: ? Make an ORS by following instructions on the  package. ? Start by drinking small amounts, about  cup (120 mL) every 5-10 minutes. ? Slowly increase how much you drink until you have taken the amount recommended by your health care provider.  Drink enough clear fluid to keep your urine clear or pale yellow. If you were told to drink an ORS, finish the ORS first, then start slowly drinking other clear fluids. Drink fluids such as: ? Water. Do not drink only water. Doing that can lead to having too little salt (sodium) in the body (hyponatremia). ? Ice chips. ? Fruit juice that you have added water to (diluted fruit juice). ? Low-calorie sports drinks.  Avoid: ? Alcohol. ? Drinks that contain a lot of sugar. These include high-calorie sports drinks, fruit juice that is not diluted, and soda. ? Caffeine. ? Foods that are greasy or contain a lot of fat or sugar.  Take over-the-counter and prescription medicines only as told by your health care provider.  Do not take sodium tablets. This can lead to having too much sodium in the body (hypernatremia).  Eat foods that contain a healthy balance of electrolytes, such as bananas, oranges, potatoes, tomatoes, and spinach.  Keep all follow-up visits as told by your health care provider. This is important. Contact a health care provider if:  You have abdominal pain that: ? Gets worse. ? Stays in one area (localizes).  You have a rash.  You have a stiff neck.  You are more irritable than usual.  You are sleepier or more difficult to wake up than usual.  You feel weak or dizzy.  You feel very thirsty.  You have urinated only a small amount of very dark urine over 6-8 hours. Get help right away if:  You have symptoms of severe dehydration.  You cannot drink fluids without vomiting.  Your symptoms get worse with treatment.  You have a fever.  You have a severe headache.  You have vomiting or diarrhea that: ? Gets worse. ? Does not go away.  You have blood or green matter  (bile) in your vomit.  You have blood in your stool. This may cause stool to look black and tarry.  You have not urinated in 6-8 hours.  You faint.  Your heart rate while sitting still is over 100 beats a minute.  You have trouble breathing. This information is not intended to replace advice given to you by your health care provider. Make sure you discuss any questions you have with your health care provider. Document Released: 09/05/2005 Document Revised: 04/01/2016 Document Reviewed: 10/30/2015 Elsevier Interactive Patient Education  2018 [employer]  

## 2018-06-15 ENCOUNTER — Encounter: Payer: Self-pay | Admitting: Family Medicine

## 2018-06-15 LAB — CBC WITH DIFFERENTIAL/PLATELET
Basophils Absolute: 38 cells/uL (ref 0–200)
Basophils Relative: 0.5 %
Eosinophils Absolute: 83 cells/uL (ref 15–500)
Eosinophils Relative: 1.1 %
HCT: 39.5 % (ref 35.0–45.0)
Hemoglobin: 13.1 g/dL (ref 11.7–15.5)
Lymphs Abs: 2618 cells/uL (ref 850–3900)
MCH: 30.3 pg (ref 27.0–33.0)
MCHC: 33.2 g/dL (ref 32.0–36.0)
MCV: 91.2 fL (ref 80.0–100.0)
MPV: 10.2 fL (ref 7.5–12.5)
Monocytes Relative: 9.3 %
Neutro Abs: 4065 cells/uL (ref 1500–7800)
Neutrophils Relative %: 54.2 %
Platelets: 356 10*3/uL (ref 140–400)
RBC: 4.33 10*6/uL (ref 3.80–5.10)
RDW: 12.8 % (ref 11.0–15.0)
Total Lymphocyte: 34.9 %
WBC mixed population: 698 cells/uL (ref 200–950)
WBC: 7.5 10*3/uL (ref 3.8–10.8)

## 2018-06-15 LAB — BASIC METABOLIC PANEL WITH GFR
BUN: 20 mg/dL (ref 7–25)
CO2: 29 mmol/L (ref 20–32)
Calcium: 9.9 mg/dL (ref 8.6–10.4)
Chloride: 102 mmol/L (ref 98–110)
Creat: 0.84 mg/dL (ref 0.50–0.99)
GFR, Est African American: 87 mL/min/{1.73_m2} (ref >=60)
GFR, Est Non African American: 75 mL/min/{1.73_m2} (ref >=60)
Glucose, Bld: 104 mg/dL — ABNORMAL HIGH (ref 65–99)
Potassium: 4.5 mmol/L (ref 3.5–5.3)
Sodium: 137 mmol/L (ref 135–146)

## 2018-06-15 LAB — TSH: TSH: 1.27 mIU/L (ref 0.40–4.50)

## 2018-06-15 NOTE — Progress Notes (Signed)
All labs are normal. 

## 2018-06-20 ENCOUNTER — Ambulatory Visit (INDEPENDENT_AMBULATORY_CARE_PROVIDER_SITE_OTHER): Payer: BC Managed Care – PPO

## 2018-06-20 ENCOUNTER — Ambulatory Visit (INDEPENDENT_AMBULATORY_CARE_PROVIDER_SITE_OTHER): Payer: BC Managed Care – PPO | Admitting: Physician Assistant

## 2018-06-20 ENCOUNTER — Ambulatory Visit: Payer: Self-pay

## 2018-06-20 VITALS — BP 117/84 | HR 104

## 2018-06-20 DIAGNOSIS — E861 Hypovolemia: Secondary | ICD-10-CM

## 2018-06-20 DIAGNOSIS — Z1231 Encounter for screening mammogram for malignant neoplasm of breast: Secondary | ICD-10-CM | POA: Diagnosis not present

## 2018-06-20 DIAGNOSIS — I9589 Other hypotension: Secondary | ICD-10-CM

## 2018-06-20 NOTE — Progress Notes (Signed)
Pt came into clinic for BP and pulse check. Pt states she has not had another dizzy spell since she was last seen. Reports she has increased her water intake. Went over readings with Provider in office, no changes. Educated on drinking 20oz of water/gatorade and eating something salty if the dizziness happens again. Pt states she will call to schedule if symptoms return.   Looked over most recent BP readings and she runs low overall.  Agree with above plan. Iran Planas PA-C

## 2018-08-28 NOTE — Progress Notes (Signed)
62 y.o. G0P0 Married White or Caucasian Not Hispanic or Latino female here for annual exam.   She had a left knee replacement in August, slow recovery. She is having some pain at the site of her left hip (replacement 20 years ago).  No vaginal bleeding. Sexually active, no pain.  Normal bowel and bladder function. She has occasional IBS. She lost 25 lbs prior to her surgery, has gained 7 lbs back.      Patient's last menstrual period was 12/18/2013.          Sexually active: Yes.    The current method of family planning is post menopausal status.    Exercising: Yes.    cardio and walking Smoker:  no  Health Maintenance: Pap:  05/14/15 Neg. HR HPV:Neg  History of abnormal Pap:  Yes, ASCUS, CIN I, several years ago. 1998 MMG:  06/20/2018 BIRADS1:neg  Colonoscopy:  10/21/14 f/u 5 years  BMD:   06/14/2017 Osteopenia T score -2.2, FRAX 17.1/2.6% TDaP:  01/24/09   reports that she has never smoked. She has never used smokeless tobacco. She reports that she drinks about 1.0 standard drinks of alcohol per week. She reports that she does not use drugs. Retired Pharmacist, hospital.  Past Medical History:  Diagnosis Date  . Abnormal Pap smear, atypical squamous cells of undetermined sign (ASC-US) 1998   treated with cryo, CIN I  . ADD (attention deficit disorder) 02-23-12   easily distracted.  . Arthritis 02-23-12   Rt. hip and lt. arm  . Kidney calculi 07/2015  . Kidney stone   . PONV (postoperative nausea and vomiting) 02-23-12   always has PONV  . Post-nasal drainage 02-23-12   frequent a problem,causes a cough and mucus buildup in throat  . Urethral polyp 1998   Dr. Hartley Barefoot  . Varicose veins   . Venous insufficiency    chronic-denies any problems    Past Surgical History:  Procedure Laterality Date  . blood clot removed Left 01/2012   blood clot removed from left hand   . BREAST BIOPSY Right   . BREAST SURGERY  02-23-12   Rt. needle biopsy, recent -negative  path showed fibroadenoma  . COLONOSCOPY   08/2003   repeat 5 yrs.  . COLONOSCOPY W/ BIOPSIES  12/2008   1 polyp recheck 5 yrs  . EXCISION/RELEASE BURSA HIP  02/29/12  . HAND SURGERY  02-23-12   "blood clot" excised palm left hand 01-18-12  . KNEE ARTHROSCOPY W/ MENISCAL REPAIR  10/2012  . KNEE ARTHROSCOPY WITH EXCISION BAKER'S CYST Left 10/2012   meniscal repair  . ORIF METATARSAL FRACTURE Left 04/07/2017   Left 5th Metatarsal  . SHOULDER SURGERY Right 02/2015   Dr. Lennette Bihari Supple - [facility]  . 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 fish oil-omega-3 fatty acids 1000 MG capsule Take 4 g by mouth daily.     Marland Kitchen FLUoxetine (PROZAC) 20 MG capsule Take 20 ng by mouth daily.    . methocarbamol (ROBAXIN) 750 MG tablet Take 1 tablet by mouth 3 (three) times daily as needed.    . Multiple Vitamin (MULTIVITAMIN) tablet Take 1 tablet by mouth daily.     . traMADol (ULTRAM) 50 MG tablet 1 tabs by mouth Q8 hours, maximum 6 tabs per day. 40 tablet 0   No current facility-administered medications for this visit.     Family History  Problem Relation Age of Onset  . Breast cancer Mother 29       breast  . Hyperlipidemia Mother   . Brain cancer Father 68       brain  . Osteoporosis Maternal Grandmother   . Drug abuse Brother     Review of Systems  Constitutional: Negative.   HENT: Negative.   Eyes: Negative.   Respiratory: Negative.   Cardiovascular: Negative.   Gastrointestinal: Negative.   Endocrine: Negative.   Genitourinary: Negative.   Musculoskeletal:       Muscle or joint pain  Skin: Negative.   Allergic/Immunologic: Negative.   Neurological: Negative.   Hematological: Negative.   Psychiatric/Behavioral: Negative.     Exam:   BP 100/60 (BP Location: Right Arm, Patient Position: Sitting, Cuff Size: Normal)   Pulse 80   Resp 14   Ht 5' 0.75" (1.543 m)   Wt 137 lb (62.1 kg)   LMP 12/18/2013   BMI  26.10 kg/m   Weight change: @WEIGHTCHANGE @ Height:   Height: 5' 0.75" (154.3 cm)  Ht Readings from Last 3 Encounters:  08/29/18 5' 0.75" (1.543 m)  06/14/18 5\' 1"  (1.549 m)  04/27/18 5' (1.524 m)    General appearance: alert, cooperative and appears stated age Head: Normocephalic, without obvious abnormality, atraumatic Neck: no adenopathy, supple, symmetrical, trachea midline and thyroid normal to inspection and palpation Lungs: clear to auscultation bilaterally Cardiovascular: regular rate and rhythm Breasts: normal appearance, no masses or tenderness Abdomen: soft, non-tender; non distended,  no masses,  no organomegaly Extremities: extremities normal, atraumatic, no cyanosis or edema Skin: Skin color, texture, turgor normal. No rashes or lesions Lymph nodes: Cervical, supraclavicular, and axillary nodes normal. No abnormal inguinal nodes palpated Neurologic: Grossly normal   Pelvic: External genitalia:  no lesions              Urethra:  normal appearing urethra with no masses, tenderness or lesions              Bartholins and Skenes: normal                 Vagina: atrophic appearing vagina with normal color and discharge, no lesions              Cervix: no lesions               Bimanual Exam:  Uterus:  normal size, contour, position, consistency, mobility, non-tender              Adnexa: no mass, fullness, tenderness               Rectovaginal: Confirms               Anus:  normal sphincter tone, no lesions  Chaperone was present for exam.  A:  Well Woman with normal exam  Osteopenia  P:   Pap with hpv  DEXA in 10/20  Discussed breast self exam  Discussed calcium and vit D intake  Mammogram and colonoscopy UTD  Labs with primary

## 2018-08-29 ENCOUNTER — Encounter: Payer: Self-pay | Admitting: Obstetrics and Gynecology

## 2018-08-29 ENCOUNTER — Other Ambulatory Visit: Payer: Self-pay

## 2018-08-29 ENCOUNTER — Other Ambulatory Visit (HOSPITAL_COMMUNITY)
Admission: RE | Admit: 2018-08-29 | Discharge: 2018-08-29 | Disposition: A | Discharge status: Home / Self Care | Payer: BC Managed Care – PPO | Source: Ambulatory Visit | Attending: Obstetrics and Gynecology | Admitting: Obstetrics and Gynecology

## 2018-08-29 ENCOUNTER — Ambulatory Visit: Payer: BC Managed Care – PPO | Admitting: Obstetrics and Gynecology

## 2018-08-29 VITALS — BP 100/60 | HR 80 | Resp 14 | Ht 60.75 in | Wt 137.0 lb

## 2018-08-29 DIAGNOSIS — M8580 Other specified disorders of bone density and structure, unspecified site: Secondary | ICD-10-CM

## 2018-08-29 DIAGNOSIS — Z01419 Encounter for gynecological examination (general) (routine) without abnormal findings: Secondary | ICD-10-CM | POA: Diagnosis not present

## 2018-08-29 DIAGNOSIS — Z124 Encounter for screening for malignant neoplasm of cervix: Secondary | ICD-10-CM | POA: Diagnosis present

## 2018-08-29 NOTE — Patient Instructions (Addendum)
Call the breast center to set up your bone density for 10/20  EXERCISE AND DIET:  We recommended that you start or continue a regular exercise program for good health. Regular exercise means any activity that makes your heart beat faster and makes you sweat.  We recommend exercising at least 30 minutes per day at least 3 days a week, preferably 4 or 5.  We also recommend a diet low in fat and sugar.  Inactivity, poor dietary choices and obesity can cause diabetes, heart attack, stroke, and kidney damage, among others.    ALCOHOL AND SMOKING:  Women should limit their alcohol intake to no more than 7 drinks/beers/glasses of wine (combined, not each!) per week. Moderation of alcohol intake to this level decreases your risk of breast cancer and liver damage. And of course, no recreational drugs are part of a healthy lifestyle.  And absolutely no smoking or even second hand smoke. Most people know smoking can cause heart and lung diseases, but did you know it also contributes to weakening of your bones? Aging of your skin?  Yellowing of your teeth and nails?  CALCIUM AND VITAMIN D:  Adequate intake of calcium and Vitamin D are recommended.  The recommendations for exact amounts of these supplements seem to change often, but generally speaking 1,200 mg of calcium (either carbonate or citrate) and 1,000 units of Vitamin D per day seems prudent. Certain women may benefit from higher intake of Vitamin D.  If you are among these women, your doctor will have told you during your visit.    PAP SMEARS:  Pap smears, to check for cervical cancer or precancers,  have traditionally been done yearly, although recent scientific advances have shown that most women can have pap smears less often.  However, every woman still should have a physical exam from her gynecologist every year. It will include a breast check, inspection of the vulva and vagina to check for abnormal growths or skin changes, a visual exam of the cervix,  and then an exam to evaluate the size and shape of the uterus and ovaries.  And after 61 years of age, a rectal exam is indicated to check for rectal cancers. We will also provide age appropriate advice regarding health maintenance, like when you should have certain vaccines, screening for sexually transmitted diseases, bone density testing, colonoscopy, mammograms, etc.   MAMMOGRAMS:  All women over 52 years old should have a yearly mammogram. Many facilities now offer a "3D" mammogram, which may cost around $50 extra out of pocket. If possible,  we recommend you accept the option to have the 3D mammogram performed.  It both reduces the number of women who will be called back for extra views which then turn out to be normal, and it is better than the routine mammogram at detecting truly abnormal areas.    COLONOSCOPY:  Colonoscopy to screen for colon cancer is recommended for all women at age 65.  We know, you hate the idea of the prep.  We agree, BUT, having colon cancer and not knowing it is worse!!  Colon cancer so often starts as a polyp that can be seen and removed at colonscopy, which can quite literally save your life!  And if your first colonoscopy is normal and you have no family history of colon cancer, most women don't have to have it again for 10 years.  Once every ten years, you can do something that may end up saving your life, right?  We  will be happy to help you get it scheduled when you are ready.  Be sure to check your insurance coverage so you understand how much it will cost.  It may be covered as a preventative service at no cost, but you should check your particular policy.     Breast Self-Awareness Breast self-awareness means being familiar with how your breasts look and feel. It involves checking your breasts regularly and reporting any changes to your health care provider. Practicing breast self-awareness is important. A change in your breasts can be a sign of a serious medical  problem. Being familiar with how your breasts look and feel allows you to find any problems early, when treatment is more likely to be successful. All women should practice breast self-awareness, including women who have had breast implants. How to do a breast self-exam One way to learn what is normal for your breasts and whether your breasts are changing is to do a breast self-exam. To do a breast self-exam: Look for Changes  1. Remove all the clothing above your waist. 2. Stand in front of a mirror in a room with good lighting. 3. Put your hands on your hips. 4. Push your hands firmly downward. 5. Compare your breasts in the mirror. Look for differences between them (asymmetry), such as: ? Differences in shape. ? Differences in size. ? Puckers, dips, and bumps in one breast and not the other. 6. Look at each breast for changes in your skin, such as: ? Redness. ? Scaly areas. 7. Look for changes in your nipples, such as: ? Discharge. ? Bleeding. ? Dimpling. ? Redness. ? A change in position. Feel for Changes  Carefully feel your breasts for lumps and changes. It is best to do this while lying on your back on the floor and again while sitting or standing in the shower or tub with soapy water on your skin. Feel each breast in the following way:  Place the arm on the side of the breast you are examining above your head.  Feel your breast with the other hand.  Start in the nipple area and make  inch (2 cm) overlapping circles to feel your breast. Use the pads of your three middle fingers to do this. Apply light pressure, then medium pressure, then firm pressure. The light pressure will allow you to feel the tissue closest to the skin. The medium pressure will allow you to feel the tissue that is a little deeper. The firm pressure will allow you to feel the tissue close to the ribs.  Continue the overlapping circles, moving downward over the breast until you feel your ribs below your  breast.  Move one finger-width toward the center of the body. Continue to use the  inch (2 cm) overlapping circles to feel your breast as you move slowly up toward your collarbone.  Continue the up and down exam using all three pressures until you reach your armpit.  Write Down What You Find  Write down what is normal for each breast and any changes that you find. Keep a written record with breast changes or normal findings for each breast. By writing this information down, you do not need to depend only on memory for size, tenderness, or location. Write down where you are in your menstrual cycle, if you are still menstruating. If you are having trouble noticing differences in your breasts, do not get discouraged. With time you will become more familiar with the variations in your breasts and  more comfortable with the exam. How often should I examine my breasts? Examine your breasts every month. If you are breastfeeding, the best time to examine your breasts is after a feeding or after using a breast pump. If you menstruate, the best time to examine your breasts is 5-7 days after your period is over. During your period, your breasts are lumpier, and it may be more difficult to notice changes. When should I see my health care provider? See your health care provider if you notice:  A change in shape or size of your breasts or nipples.  A change in the skin of your breast or nipples, such as a reddened or scaly area.  Unusual discharge from your nipples.  A lump or thick area that was not there before.  Pain in your breasts.  Anything that concerns you.  This information is not intended to replace advice given to you by your health care provider. Make sure you discuss any questions you have with your health care provider. Document Released: 09/05/2005 Document Revised: 02/11/2016 Document Reviewed: 07/26/2015 Elsevier Interactive Patient Education  [employer].

## 2018-08-31 LAB — CYTOLOGY - PAP
Diagnosis: NEGATIVE
HPV: NOT DETECTED

## 2018-09-07 DIAGNOSIS — M25562 Pain in left knee: Secondary | ICD-10-CM | POA: Insufficient documentation

## 2019-05-24 ENCOUNTER — Other Ambulatory Visit: Payer: Self-pay | Admitting: Family Medicine

## 2019-05-24 DIAGNOSIS — Z1231 Encounter for screening mammogram for malignant neoplasm of breast: Secondary | ICD-10-CM

## 2019-06-24 ENCOUNTER — Telehealth: Payer: Self-pay

## 2019-06-24 ENCOUNTER — Other Ambulatory Visit: Payer: Self-pay

## 2019-06-24 ENCOUNTER — Emergency Department
Admission: EM | Admit: 2019-06-24 | Discharge: 2019-06-24 | Disposition: A | Discharge status: Home / Self Care | Payer: BC Managed Care – PPO | Source: Home / Self Care | Attending: Family Medicine | Admitting: Family Medicine

## 2019-06-24 DIAGNOSIS — H6593 Unspecified nonsuppurative otitis media, bilateral: Secondary | ICD-10-CM | POA: Diagnosis not present

## 2019-06-24 DIAGNOSIS — H6121 Impacted cerumen, right ear: Secondary | ICD-10-CM

## 2019-06-24 MED ORDER — AMOXICILLIN 875 MG PO TABS: 875.0000 mg | ORAL_TABLET | Freq: Two times a day (BID) | ORAL | 0 refills | Status: DC

## 2019-06-24 NOTE — ED Provider Notes (Signed)
[facility]    CSN: [id number] Arrival date & time: 06/24/19  1222      History   Chief Complaint Chief Complaint  Patient presents with  . Otalgia    bilateral    HPI Wanda Gonzales is a 63 y.o. female.   Patient developed a mild sore throat 6 days ago.  The next day she developed bilateral earache which has gradually become worse.  She notes that her hearing has decreased.  She denies cough and no fever.  She has had no changes in taste/smell.  The history is provided by the patient.    Past Medical History:  Diagnosis Date  . Abnormal Pap smear, atypical squamous cells of undetermined sign (ASC-US) 1998   treated with cryo, CIN I  . ADD (attention deficit disorder) 02-23-12   easily distracted.  . Arthritis 02-23-12   Rt. hip and lt. arm  . Kidney calculi 07/2015  . Kidney stone   . PONV (postoperative nausea and vomiting) 02-23-12   always has PONV  . Post-nasal drainage 02-23-12   frequent a problem,causes a cough and mucus buildup in throat  . Urethral polyp 1998   Dr. Hartley Barefoot  . Varicose veins   . Venous insufficiency    chronic-denies any problems    Patient Active Problem List   Diagnosis Date Noted  . Seborrheic keratosis 11/23/2017  . Closed displaced fracture of fifth metatarsal bone of left foot 03/31/2017  . 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 Baker's cyst of knee, left 08/25/2016  . Benign paroxysmal positional vertigo 08/25/2016  . Back pain 12/16/2015  . Cervical strain 11/21/2014  . Right shoulder pain 05/15/2014  . DDD (degenerative disc disease), lumbar 02/17/2014  . Unspecified vitamin D deficiency 03/18/2013  . Trochanteric bursitis of right hip 02/29/2012  . INSOMNIA 04/13/2010  . HYPERLIPIDEMIA 09/17/2007  . CERVICAL POLYP 09/17/2007  . Attention deficit disorder 09/11/2007  . UNSPECIFIED VENOUS INSUFFICIENCY 08/29/2007    Past Surgical History:  Procedure Laterality Date  . blood clot removed Left 01/2012    blood clot removed from left hand   . BREAST BIOPSY Right   . BREAST SURGERY  02-23-12   Rt. needle biopsy, recent -negative  path showed fibroadenoma  . COLONOSCOPY  08/2003   repeat 5 yrs.  . COLONOSCOPY W/ BIOPSIES  12/2008   1 polyp recheck 5 yrs  . EXCISION/RELEASE BURSA HIP  02/29/12  . HAND SURGERY  02-23-12   "blood clot" excised palm left hand 01-18-12  . KNEE ARTHROSCOPY W/ MENISCAL REPAIR  10/2012  . KNEE ARTHROSCOPY WITH EXCISION BAKER'S CYST Left 10/2012   meniscal repair  . ORIF METATARSAL FRACTURE Left 04/07/2017   Left 5th Metatarsal  . SHOULDER SURGERY Right 02/2015   Dr. Lennette Bihari Supple - [facility]  . 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 Authorizing Provider  amoxicillin (AMOXIL) 875 MG tablet Take 1 tablet (875 mg total) by mouth 2 (two) times daily. 06/24/19   Kandra Nicolas, MD  fish oil-omega-3 fatty acids 1000 MG capsule Take 4 g by mouth daily.     [provider]  FLUoxetine (PROZAC) 20 MG capsule Take 20 ng by mouth daily.    [provider]  methocarbamol (ROBAXIN) 750 MG tablet Take 1 tablet by mouth 3 (three) times daily as needed. 06/12/18   [provider]  Multiple Vitamin (MULTIVITAMIN) tablet Take 1 tablet by mouth daily.     [provider]  traMADol (ULTRAM) 50 MG tablet 1 tabs by mouth Q8 hours, maximum 6 tabs per day. 02/09/18   Silverio Decamp, MD    Family History Family History  Problem Relation Age of Onset  . Breast cancer Mother 43       breast  . Hyperlipidemia Mother   . Brain cancer Father 72       brain  . Osteoporosis Maternal Grandmother   . Drug abuse Brother      Social History Social History   Tobacco Use  . Smoking status: Never Smoker  . Smokeless tobacco: Never Used  Substance Use Topics  . Alcohol use: Yes    Alcohol/week: 1.0 standard drinks    Types: 1 Glasses of wine per week    Comment: rare occ.  . Drug use: No     Allergies   Codeine, Doxycycline, Concerta [methylphenidate], and Sulfa antibiotics   Review of Systems Review of Systems + sore throat No cough No pleuritic pain No wheezing + nasal congestion + post-nasal drainage No sinus pain/pressure No itchy/red eyes + earache No hemoptysis No SOB No fever/chills No nausea No vomiting No abdominal pain No diarrhea No urinary symptoms No skin rash + fatigue No myalgias No headache    Physical Exam Triage Vital Signs ED Triage Vitals  Enc Vitals Group     BP 06/24/19 1236 119/73     Pulse Rate 06/24/19 1236 68     Resp 06/24/19 1236 18     Temp 06/24/19 1236 97.9 F (36.6 C)     Temp Source 06/24/19 1236 Oral     SpO2 06/24/19 1236 100 %     Weight 06/24/19 1238 132 lb (59.9 kg)     Height 06/24/19 1238 5\' 1"  (1.549 m)     Head Circumference --      Peak Flow --      Pain Score 06/24/19 1238 0     Pain Loc --      Pain Edu? --      Excl. in [place]? --    No data found.  Updated Vital Signs BP 119/73 (BP Location: Right Arm)   Pulse 68   Temp 97.9 F (36.6 C) (Oral)   Resp 18   Ht 5\' 1"  (1.549 m)   Wt 59.9 kg   LMP 12/18/2013   SpO2 100%   BMI 24.94 kg/m   Visual Acuity Right Eye Distance:   Left Eye Distance:   Bilateral Distance:    Right Eye Near:   Left Eye Near:    Bilateral Near:     Physical Exam Nursing notes and Vital Signs reviewed. Appearance:  Patient appears stated age, and in no acute distress Eyes:  Pupils are equal, round, and reactive to light and accomodation.  Extraocular movement is intact.  Conjunctivae are not inflamed  Ears:  Cerumen impaction right ear; unable to visualize right tympanic membrane.  Left  tympanic membrane appears normal. Nose: Congested turbinates.  No sinus tenderness.   Pharynx:  Normal Neck:  Supple.  Enlarged posterior/lateral nodes are palpated bilaterally, tender to palpation on the left. Lungs:  Clear to auscultation.  Breath sounds are equal.  Moving air well. Heart:  Regular rate and rhythm without murmurs, rubs, or gallops.  Abdomen:  Nontender without masses or hepatosplenomegaly.  Bowel sounds are present.  No CVA or flank tenderness.  Extremities:  No edema.  Skin:  No rash present.    UC Treatments / Results  Labs (all labs ordered are listed, but only abnormal results are displayed) Labs Reviewed - Tympanometry:  Right ear tympanogram low peak height; Left ear tympanogram low peak height ("flat line")  EKG   Radiology No results found.  Procedures Procedures (including critical care time)  Medications Ordered in UC Medications - No data to display  Initial Impression / Assessment and Plan / UC Course  I have reviewed the triage vital signs and the nursing notes.  Pertinent labs & imaging results that were available during my care of the patient were reviewed by me and considered in my medical decision making (see chart for details).    Suspect early viral URI. Unable to visualize right tympanic membrane, but tympanogram suggests otitis media.  Will begin amoxicillin. Followup with Family Doctor if not improved in one week.   Final Clinical Impressions(s) / UC Diagnoses   Final diagnoses:  Bilateral non-suppurative otitis media  Impacted cerumen of right ear     Discharge Instructions     If cough develops, take plain guaifenesin (1200mg  extended release tabs such as Mucinex) twice daily, with plenty of water.   May add Pseudoephedrine (30mg , one or two every 4 to 6 hours) for sinus congestion.  Get adequate rest.   May take Delsym Cough Suppressant at bedtime for nighttime cough.  Try warm salt water gargles for sore throat.  Stop all  antihistamines for now, and other non-prescription cough/cold preparations.   To prevent recurrent ear wax blockage, try the following: Soak two cotton balls with mineral oil, and gently place in each ear canal once weekly.  Leave the cotton balls in place for 10 to 20 minutes.  This will help liquefy the ear wax and aid your body's normal elimination process.  If applicable, do not use a hearing aid for 8 hours overnight.  Have your ears cleaned by a health professional every 6 to 12 months.  Avoid using "Q-tips" and ear wax softening solutions       ED Prescriptions    Medication Sig Dispense Auth. Provider   amoxicillin (AMOXIL) 875 MG tablet Take 1 tablet (875 mg total) by mouth 2 (two) times daily. 20 tablet Kandra Nicolas, MD        Kandra Nicolas, MD 06/30/19 2105

## 2019-06-24 NOTE — ED Triage Notes (Signed)
Pt c/o bilateral ear pain x 5 days. Also has scratchy throat possibly due to post nasal drip. Hx of seasonal allergies. Not currently taking any OTC meds

## 2019-06-24 NOTE — Discharge Instructions (Addendum)
If cough develops, take plain guaifenesin (1200mg  extended release tabs such as Mucinex) twice daily, with plenty of water.   May add Pseudoephedrine (30mg , one or two every 4 to 6 hours) for sinus congestion.  Get adequate rest.   May take Delsym Cough Suppressant at bedtime for nighttime cough.  Try warm salt water gargles for sore throat.  Stop all antihistamines for now, and other non-prescription cough/cold preparations.   To prevent recurrent ear wax blockage, try the following: Soak two cotton balls with mineral oil, and gently place in each ear canal once weekly.  Leave the cotton balls in place for 10 to 20 minutes.  This will help liquefy the ear wax and aid your body's normal elimination process.  If applicable, do not use a hearing aid for 8 hours overnight.  Have your ears cleaned by a health professional every 6 to 12 months.  Avoid using "Q-tips" and ear wax softening solutions

## 2019-06-24 NOTE — Telephone Encounter (Signed)
Patient called requesting to be seen today for SX such as "generally feeling bad", sinus issues, and ear pain.   I advised patient we could have her do a virtual tomorrow, or she is able to go to [employer] to get COVID tested.   Patient declined appt tomorrow stating she already has a COVID test scheduled for today and she is wanting to return to work tomorrow.   I advised patient of quarantine requirements after being COVID tested, she states she was not aware of that. Patient states she will just think about her options and call back.Marland Kitchen advised her not to return to work or go out in public if still having SX.

## 2019-06-26 ENCOUNTER — Ambulatory Visit (INDEPENDENT_AMBULATORY_CARE_PROVIDER_SITE_OTHER): Payer: BC Managed Care – PPO

## 2019-06-26 ENCOUNTER — Other Ambulatory Visit: Payer: Self-pay

## 2019-06-26 DIAGNOSIS — M8580 Other specified disorders of bone density and structure, unspecified site: Secondary | ICD-10-CM

## 2019-06-26 DIAGNOSIS — Z1231 Encounter for screening mammogram for malignant neoplasm of breast: Secondary | ICD-10-CM

## 2019-07-08 ENCOUNTER — Telehealth: Payer: Self-pay

## 2019-07-08 DIAGNOSIS — M810 Age-related osteoporosis without current pathological fracture: Secondary | ICD-10-CM

## 2019-07-08 NOTE — Telephone Encounter (Signed)
Spoke with patient. Advised of results. Patient verbalizes understanding. Patient would like referral to endocrinology. Referral placed to Dr.Altheimer. Patient is aware she will be contacted to schedule an appointment. Encounter closed.

## 2019-07-08 NOTE — Telephone Encounter (Signed)
Notes recorded by Earland Reish, Harley Hallmark, RN on 07/08/2019 at 11:35 AM EDT  Left message to call [place] at [phone].  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 Her spine was not imaged secondary to degenerative changes.  I would recommend that we send her to an Endocrinologist to discuss if she should have treatment. Typically treatment is based on osteoporosis of the spine or hip or an elevated risk of any fracture of 20% or hip fracture of 3%. The radiologist can't calculate her risk without evaluating her spine. She should be getting calcium and vit d and exercising regularly.

## 2019-07-08 NOTE — Telephone Encounter (Signed)
Patient is returning call to Kaitlyn.  

## 2019-08-29 ENCOUNTER — Telehealth: Payer: Self-pay

## 2019-08-29 NOTE — Telephone Encounter (Signed)
Spoke with patient. Patient will call Dr.Altheimer's office to schedule an appointment. Will return call if she has any difficulty scheduling.  Routing to provider and will close encounter.

## 2019-08-29 NOTE — Telephone Encounter (Signed)
Wanda Dom, MD  [place], Harley Hallmark, RN; Wanda Gonzales,  Please call the patient and make sure she understands that Dr Altheimer's office is trying to schedule her for a visit to discuss osteoporosis.  Thanks,  Sharee Pimple   CC: [employer] to provider and will close encounter.

## 2019-08-29 NOTE — Telephone Encounter (Signed)
Patient is returning call to [facility]. Patient stated that she is working and can be reached after 4pm.

## 2019-09-04 ENCOUNTER — Ambulatory Visit: Payer: BC Managed Care – PPO | Admitting: Obstetrics and Gynecology

## 2019-09-11 ENCOUNTER — Other Ambulatory Visit: Payer: Self-pay

## 2019-09-16 ENCOUNTER — Other Ambulatory Visit: Payer: Self-pay

## 2019-09-16 ENCOUNTER — Ambulatory Visit: Payer: BC Managed Care – PPO | Admitting: Obstetrics and Gynecology

## 2019-09-16 ENCOUNTER — Encounter: Payer: Self-pay | Admitting: Obstetrics and Gynecology

## 2019-09-16 VITALS — BP 94/62 | HR 76 | Temp 98.4°F | Ht 61.0 in | Wt 132.4 lb

## 2019-09-16 DIAGNOSIS — Z0000 Encounter for general adult medical examination without abnormal findings: Secondary | ICD-10-CM | POA: Diagnosis not present

## 2019-09-16 DIAGNOSIS — Z01419 Encounter for gynecological examination (general) (routine) without abnormal findings: Secondary | ICD-10-CM | POA: Diagnosis not present

## 2019-09-16 DIAGNOSIS — M810 Age-related osteoporosis without current pathological fracture: Secondary | ICD-10-CM

## 2019-09-16 DIAGNOSIS — Z23 Encounter for immunization: Secondary | ICD-10-CM | POA: Diagnosis not present

## 2019-09-16 NOTE — Progress Notes (Signed)
63 y.o. G0P0 Married White or Caucasian Not Hispanic or Latino female here for annual exam.    She was diagnosed with osteoporosis of her forearm in 10/20, no significant change in her hip, spine not imaged secondary to degenerative changes. Referred to Dr Altheimer in Endocrinology. She has an appointment in March.     Patient's last menstrual period was 12/18/2013.          Sexually active: No.  The current method of family planning is post menopausal status.    Exercising: Yes.    walking 1 mile per day, video workouts, core wore outs, weights Smoker:  no  Health Maintenance: Pap:08/29/2018 WNL NEG HPV, 05/14/15 Neg. HR HPV:Neg History of abnormal Pap:Yes, ASCUS, CIN I, several years ago. 1998 MMG:06/26/2019 BIRADS1:neg Colonoscopy:10/21/14 f/u 5 years BMD:06/26/2019 Osteoporosis TDaP:01/24/09   reports that she has never smoked. She has never used smokeless tobacco. She reports current alcohol use of about 1.0 standard drinks of alcohol per week. She reports that she does not use drugs. 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   Past Medical History:  Diagnosis Date  . Abnormal Pap smear, atypical squamous cells of undetermined sign (ASC-US) 1998   treated with cryo, CIN I  . ADD (attention deficit disorder) 02-23-12   easily distracted.  . Arthritis 02-23-12   Rt. hip and lt. arm  . Kidney calculi 07/2015  . Kidney stone   . PONV (postoperative nausea and vomiting) 02-23-12   always has PONV  . Post-nasal drainage 02-23-12   frequent a problem,causes a cough and mucus buildup in throat  . Urethral polyp 1998   Dr. Hartley Barefoot  . Varicose veins   . Venous insufficiency    chronic-denies any problems    Past Surgical History:  Procedure Laterality Date  . blood clot removed Left 01/2012   blood clot removed from left hand   . BREAST BIOPSY Right   . BREAST SURGERY  02-23-12   Rt. needle biopsy, recent -negative  path showed  fibroadenoma  . COLONOSCOPY  08/2003   repeat 5 yrs.  . COLONOSCOPY W/ BIOPSIES  12/2008   1 polyp recheck 5 yrs  . EXCISION/RELEASE BURSA HIP  02/29/12  . HAND SURGERY  02-23-12   "blood clot" excised palm left hand 01-18-12  . KNEE ARTHROSCOPY W/ MENISCAL REPAIR  10/2012  . KNEE ARTHROSCOPY WITH EXCISION BAKER'S CYST Left 10/2012   meniscal repair  . ORIF METATARSAL FRACTURE Left 04/07/2017   Left 5th Metatarsal  . SHOULDER SURGERY Right 02/2015   Dr. Lennette Bihari Supple - [facility]  . 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 Multiple Vitamin (MULTIVITAMIN) tablet Take 1 tablet by mouth daily.     . TURMERIC PO Take by mouth.    Marland Kitchen VITAMIN D PO Take by mouth.     No current facility-administered medications for this visit.    Family History  Problem Relation Age of Onset  . Breast cancer Mother 55       breast  . Hyperlipidemia Mother   . Brain cancer Father 93       brain  . Osteoporosis Maternal Grandmother   . Drug abuse Brother     Review of Systems  Constitutional: Negative.   HENT: Negative.   Eyes: Negative.   Respiratory: Negative.   Cardiovascular: Negative.  Gastrointestinal: Negative.   Endocrine: Negative.   Genitourinary: Negative.   Musculoskeletal: Negative.   Skin: Negative.   Allergic/Immunologic: Negative.   Neurological: Negative.   Hematological: Negative.   Psychiatric/Behavioral: Negative.     Exam:   BP 94/62 (BP Location: Right Arm, Patient Position: Sitting, Cuff Size: Normal)   Pulse 76   Temp 98.4 F (36.9 C) (Skin)   Ht 5\' 1"  (1.549 m)   Wt 132 lb 6.4 oz (60.1 kg)   LMP 12/18/2013   BMI 25.02 kg/m   Weight change: @WEIGHTCHANGE @ Height:   Height: 5\' 1"  (154.9 cm)  Ht Readings from Last 3 Encounters:  09/16/19 5\' 1"  (1.549 m)  06/24/19 5\' 1"  (1.549 m)  08/29/18 5' 0.75" (1.543 m)    General appearance: alert,  cooperative and appears stated age Head: Normocephalic, without obvious abnormality, atraumatic Neck: no adenopathy, supple, symmetrical, trachea midline and thyroid normal to inspection and palpation Lungs: clear to auscultation bilaterally Cardiovascular: regular rate and rhythm Breasts: normal appearance, no masses or tenderness Abdomen: soft, non-tender; non distended,  no masses,  no organomegaly Extremities: extremities normal, atraumatic, no cyanosis or edema Skin: Skin color, texture, turgor normal. No rashes or lesions Lymph nodes: Cervical, supraclavicular, and axillary nodes normal. No abnormal inguinal nodes palpated Neurologic: Grossly normal   Pelvic: External genitalia:  no lesions              Urethra:  normal appearing urethra with no masses, tenderness or lesions              Bartholins and Skenes: normal                 Vagina: normal appearing vagina with normal color and discharge, no lesions              Cervix: no lesions               Bimanual Exam:  Uterus:  normal size, contour, position, consistency, mobility, non-tender              Adnexa: no mass, fullness, tenderness               Rectovaginal: Confirms               Anus:  normal sphincter tone, no lesions  Wanda Gonzales chaperoned for the exam.  A:  Well Woman with normal exam  Osteoporosis in her forearm  TDAP immunization due  P:   She has an appointment to see Endocrinology in March to discuss her DEXA  Discussed breast self exam  Discussed calcium and vit D intake  Pap UTD  Mammogram UTD  Colonoscopy in 2/21, she will call and schedule.   TDAP  Screening labs, vit d

## 2019-09-16 NOTE — Patient Instructions (Signed)
EXERCISE AND DIET:  We recommended that you start or continue a regular exercise program for good health. Regular exercise means any activity that makes your heart beat faster and makes you sweat.  We recommend exercising at least 30 minutes per day at least 3 days a week, preferably 4 or 5.  We also recommend a diet low in fat and sugar.  Inactivity, poor dietary choices and obesity can cause diabetes, heart attack, stroke, and kidney damage, among others.    ALCOHOL AND SMOKING:  Women should limit their alcohol intake to no more than 7 drinks/beers/glasses of wine (combined, not each!) per week. Moderation of alcohol intake to this level decreases your risk of breast cancer and liver damage. And of course, no recreational drugs are part of a healthy lifestyle.  And absolutely no smoking or even second hand smoke. Most people know smoking can cause heart and lung diseases, but did you know it also contributes to weakening of your bones? Aging of your skin?  Yellowing of your teeth and nails?  CALCIUM AND VITAMIN D:  Adequate intake of calcium and Vitamin D are recommended.  The recommendations for exact amounts of these supplements seem to change often, but generally speaking 1,200 mg of calcium (between diet and supplement) and 800 units of Vitamin D per day seems prudent. Certain women may benefit from higher intake of Vitamin D.  If you are among these women, your doctor will have told you during your visit.    PAP SMEARS:  Pap smears, to check for cervical cancer or precancers,  have traditionally been done yearly, although recent scientific advances have shown that most women can have pap smears less often.  However, every woman still should have a physical exam from her gynecologist every year. It will include a breast check, inspection of the vulva and vagina to check for abnormal growths or skin changes, a visual exam of the cervix, and then an exam to evaluate the size and shape of the uterus and  ovaries.  And after 62 years of age, a rectal exam is indicated to check for rectal cancers. We will also provide age appropriate advice regarding health maintenance, like when you should have certain vaccines, screening for sexually transmitted diseases, bone density testing, colonoscopy, mammograms, etc.   MAMMOGRAMS:  All women over 40 years old should have a yearly mammogram. Many facilities now offer a "3D" mammogram, which may cost around $50 extra out of pocket. If possible,  we recommend you accept the option to have the 3D mammogram performed.  It both reduces the number of women who will be called back for extra views which then turn out to be normal, and it is better than the routine mammogram at detecting truly abnormal areas.    COLON CANCER SCREENING: Now recommend starting at age 45. At this time colonoscopy is not covered for routine screening until 50. There are take home tests that can be done between 45-49.   COLONOSCOPY:  Colonoscopy to screen for colon cancer is recommended for all women at age 50.  We know, you hate the idea of the prep.  We agree, BUT, having colon cancer and not knowing it is worse!!  Colon cancer so often starts as a polyp that can be seen and removed at colonscopy, which can quite literally save your life!  And if your first colonoscopy is normal and you have no family history of colon cancer, most women don't have to have it again for   10 years.  Once every ten years, you can do something that may end up saving your life, right?  We will be happy to help you get it scheduled when you are ready.  Be sure to check your insurance coverage so you understand how much it will cost.  It may be covered as a preventative service at no cost, but you should check your particular policy.      Breast Self-Awareness Breast self-awareness means being familiar with how your breasts look and feel. It involves checking your breasts regularly and reporting any changes to your  health care provider. Practicing breast self-awareness is important. A change in your breasts can be a sign of a serious medical problem. Being familiar with how your breasts look and feel allows you to find any problems early, when treatment is more likely to be successful. All women should practice breast self-awareness, including women who have had breast implants. How to do a breast self-exam One way to learn what is normal for your breasts and whether your breasts are changing is to do a breast self-exam. To do a breast self-exam: Look for Changes  1. Remove all the clothing above your waist. 2. Stand in front of a mirror in a room with good lighting. 3. Put your hands on your hips. 4. Push your hands firmly downward. 5. Compare your breasts in the mirror. Look for differences between them (asymmetry), such as: ? Differences in shape. ? Differences in size. ? Puckers, dips, and bumps in one breast and not the other. 6. Look at each breast for changes in your skin, such as: ? Redness. ? Scaly areas. 7. Look for changes in your nipples, such as: ? Discharge. ? Bleeding. ? Dimpling. ? Redness. ? A change in position. Feel for Changes Carefully feel your breasts for lumps and changes. It is best to do this while lying on your back on the floor and again while sitting or standing in the shower or tub with soapy water on your skin. Feel each breast in the following way:  Place the arm on the side of the breast you are examining above your head.  Feel your breast with the other hand.  Start in the nipple area and make  inch (2 cm) overlapping circles to feel your breast. Use the pads of your three middle fingers to do this. Apply light pressure, then medium pressure, then firm pressure. The light pressure will allow you to feel the tissue closest to the skin. The medium pressure will allow you to feel the tissue that is a little deeper. The firm pressure will allow you to feel the tissue  close to the ribs.  Continue the overlapping circles, moving downward over the breast until you feel your ribs below your breast.  Move one finger-width toward the center of the body. Continue to use the  inch (2 cm) overlapping circles to feel your breast as you move slowly up toward your collarbone.  Continue the up and down exam using all three pressures until you reach your armpit.  Write Down What You Find  Write down what is normal for each breast and any changes that you find. Keep a written record with breast changes or normal findings for each breast. By writing this information down, you do not need to depend only on memory for size, tenderness, or location. Write down where you are in your menstrual cycle, if you are still menstruating. If you are having trouble noticing differences   in your breasts, do not get discouraged. With time you will become more familiar with the variations in your breasts and more comfortable with the exam. How often should I examine my breasts? Examine your breasts every month. If you are breastfeeding, the best time to examine your breasts is after a feeding or after using a breast pump. If you menstruate, the best time to examine your breasts is 5-7 days after your period is over. During your period, your breasts are lumpier, and it may be more difficult to notice changes. When should I see my health care provider? See your health care provider if you notice:  A change in shape or size of your breasts or nipples.  A change in the skin of your breast or nipples, such as a reddened or scaly area.  Unusual discharge from your nipples.  A lump or thick area that was not there before.  Pain in your breasts.  Anything that concerns you.  

## 2019-09-17 LAB — COMPREHENSIVE METABOLIC PANEL
ALT: 19 IU/L (ref 0–32)
AST: 25 IU/L (ref 0–40)
Albumin/Globulin Ratio: 1.8 (ref 1.2–2.2)
Albumin: 4.4 g/dL (ref 3.8–4.8)
Alkaline Phosphatase: 89 IU/L (ref 39–117)
BUN/Creatinine Ratio: 23 (ref 12–28)
BUN: 18 mg/dL (ref 8–27)
Bilirubin Total: 0.6 mg/dL (ref 0.0–1.2)
CO2: 25 mmol/L (ref 20–29)
Calcium: 9.8 mg/dL (ref 8.7–10.3)
Chloride: 103 mmol/L (ref 96–106)
Creatinine, Ser: 0.8 mg/dL (ref 0.57–1.00)
GFR calc Af Amer: 91 mL/min/{1.73_m2} (ref >59)
GFR calc non Af Amer: 79 mL/min/{1.73_m2} (ref >59)
Globulin, Total: 2.4 g/dL (ref 1.5–4.5)
Glucose: 82 mg/dL (ref 65–99)
Potassium: 4.2 mmol/L (ref 3.5–5.2)
Sodium: 142 mmol/L (ref 134–144)
Total Protein: 6.8 g/dL (ref 6.0–8.5)

## 2019-09-17 LAB — LIPID PANEL
Chol/HDL Ratio: 3.9 ratio (ref 0.0–4.4)
Cholesterol, Total: 229 mg/dL — ABNORMAL HIGH (ref 100–199)
HDL: 59 mg/dL (ref >39)
LDL Chol Calc (NIH): 146 mg/dL — ABNORMAL HIGH (ref 0–99)
Triglycerides: 133 mg/dL (ref 0–149)
VLDL Cholesterol Cal: 24 mg/dL (ref 5–40)

## 2019-09-17 LAB — CBC
Hematocrit: 41.6 % (ref 34.0–46.6)
Hemoglobin: 13.7 g/dL (ref 11.1–15.9)
MCH: 30.4 pg (ref 26.6–33.0)
MCHC: 32.9 g/dL (ref 31.5–35.7)
MCV: 92 fL (ref 79–97)
Platelets: 261 10*3/uL (ref 150–450)
RBC: 4.5 x10E6/uL (ref 3.77–5.28)
RDW: 12.3 % (ref 11.7–15.4)
WBC: 5.3 10*3/uL (ref 3.4–10.8)

## 2019-09-17 LAB — VITAMIN D 25 HYDROXY (VIT D DEFICIENCY, FRACTURES): Vit D, 25-Hydroxy: 47.2 ng/mL (ref 30.0–100.0)

## 2019-12-26 LAB — HM COLONOSCOPY

## 2020-01-06 ENCOUNTER — Encounter: Payer: Self-pay | Admitting: Family Medicine

## 2020-01-30 ENCOUNTER — Ambulatory Visit (INDEPENDENT_AMBULATORY_CARE_PROVIDER_SITE_OTHER): Payer: BC Managed Care – PPO | Admitting: Rehabilitative and Restorative Service Providers"

## 2020-01-30 ENCOUNTER — Other Ambulatory Visit: Payer: Self-pay

## 2020-01-30 DIAGNOSIS — R29898 Other symptoms and signs involving the musculoskeletal system: Secondary | ICD-10-CM

## 2020-01-30 DIAGNOSIS — R2689 Other abnormalities of gait and mobility: Secondary | ICD-10-CM

## 2020-01-30 DIAGNOSIS — M25552 Pain in left hip: Secondary | ICD-10-CM

## 2020-01-30 NOTE — Therapy (Signed)
[facility] [place], [place], [address] Phone: [phone]   Fax:  [phone]  Physical Therapy Evaluation  Patient Details  Name: Wanda Gonzales MRN: [id number] Date of Birth: 1955/10/20 Referring Provider (PT): Dr Gaynelle Arabian   Encounter Date: 01/30/2020  PT End of Session - 01/30/20 1529    Visit Number  1    Number of Visits  12    Date for PT Re-Evaluation  03/12/20    PT Start Time  1450    PT Stop Time  1540   MHP 15 min   PT Time Calculation (min)  50 min    Activity Tolerance  Patient tolerated treatment well    Behavior During Therapy  [facility] for tasks assessed/performed       Past Medical History:  Diagnosis Date  . Abnormal Pap smear, atypical squamous cells of undetermined sign (ASC-US) 1998   treated with cryo, CIN I  . ADD (attention deficit disorder) 02-23-12   easily distracted.  . Arthritis 02-23-12   Rt. hip and lt. arm  . Kidney calculi 07/2015  . Kidney stone   . PONV (postoperative nausea and vomiting) 02-23-12   always has PONV  . Post-nasal drainage 02-23-12   frequent a problem,causes a cough and mucus buildup in throat  . Urethral polyp 1998   Dr. Hartley Barefoot  . Varicose veins   . Venous insufficiency    chronic-denies any problems    Past Surgical History:  Procedure Laterality Date  . blood clot removed Left 01/2012   blood clot removed from left hand   . BREAST BIOPSY Right   . BREAST SURGERY  02-23-12   Rt. needle biopsy, recent -negative  path showed fibroadenoma  . COLONOSCOPY  08/2003   repeat 5 yrs.  . COLONOSCOPY W/ BIOPSIES  12/2008   1 polyp recheck 5 yrs  . EXCISION/RELEASE BURSA HIP  02/29/12  . HAND SURGERY  02-23-12   "blood clot" excised palm left hand 01-18-12  . KNEE ARTHROSCOPY W/ MENISCAL REPAIR  10/2012  . KNEE ARTHROSCOPY WITH EXCISION BAKER'S CYST Left 10/2012   meniscal repair  . ORIF METATARSAL FRACTURE Left 04/07/2017   Left 5th Metatarsal  . SHOULDER  SURGERY Right 02/2015   Dr. Lennette Bihari Supple - [facility]  . 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   Subjective Assessment - 01/30/20 1455    Subjective  Patient reports that she has pain in the Lt hip which has been hurting for the past 2-3 months with no known injury.    Pertinent History  Lt THA ~ 21 yrs ago; Lt TKA; fx Lt foot with ORIF plate and screws; osteoarthritis; oseoporosis    Diagnostic tests  MRI (-) Lt hip    Patient Stated Goals  get rid of the Lt hip pain    Currently in Pain?  Yes    Pain Score  4     Pain Location  Hip    Pain Orientation  Left;Posterior;Anterior    Pain Descriptors / Indicators  Aching;Throbbing;Sore    Pain Type  Acute pain;Chronic pain    Pain Radiating Towards  into Lt groin; Lt knee    Pain Onset  More than a month ago    Pain Frequency  Constant    Aggravating Factors   lying on Lt side; walking; up and down  Statement  Patient presents with recurrent Lt hip pain and dysfunction with recent flare up ~ 2-3 months ago with no known injury. Patient has significant muscular tightness in the Lt hip and thigh including psoas, hip  flexors, TFL/ITB; adductors, posterior hip. She has limited mobility through the Lt hip and in knee flexion. Paitent has limited functional abilities and pain on a constant basis. She will benefit from PT to address problems identified.    Stability/Clinical Decision Making  Stable/Uncomplicated    Clinical Decision Making  Low    Rehab Potential  Good    PT Frequency  2x / week    PT Duration  6 weeks    PT Treatment/Interventions  Patient/family education;ADLs/Self Care Home Management;Cryotherapy;Electrical Stimulation;Iontophoresis 4mg /ml Dexamethasone;Moist Heat;Ultrasound;Gait training;Stair training;Therapeutic activities;Therapeutic exercise;Balance training;Neuromuscular re-education;Manual techniques;Dry needling;Taping    PT Next Visit Plan  review HEP; progress with stretching hip flexors/quads/adductors; add manual work through the muscular tightness; add iontophoresis; modalities as indicated    PT Home Exercise Plan  [email or web address]    Consulted and Agree with Plan of Care  Patient       Patient will benefit from skilled therapeutic intervention in order to improve the following deficits and impairments:  Decreased range of motion, Increased fascial restricitons, Pain, Impaired flexibility, Improper body mechanics, Decreased mobility, Decreased strength  Visit Diagnosis: Pain in left hip - Plan: PT plan of care cert/re-cert  Other symptoms and signs involving the musculoskeletal system - Plan: PT plan of care cert/re-cert  Weakness of proximal end of lower extremity - Plan: PT plan of care cert/re-cert  Other abnormalities of gait and mobility - Plan: PT plan of care cert/re-cert     Problem List Patient Active Problem List   Diagnosis Date Noted  . Seborrheic keratosis 11/23/2017  . Closed displaced fracture of fifth metatarsal bone of left foot 03/31/2017  . 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 Baker's cyst of knee, left 08/25/2016  . Benign paroxysmal  positional vertigo 08/25/2016  . Back pain 12/16/2015  . Cervical strain 11/21/2014  . Right shoulder pain 05/15/2014  . DDD (degenerative disc disease), lumbar 02/17/2014  . Unspecified vitamin D deficiency 03/18/2013  . Trochanteric bursitis of right hip 02/29/2012  . INSOMNIA 04/13/2010  . HYPERLIPIDEMIA 09/17/2007  . CERVICAL POLYP 09/17/2007  . Attention deficit disorder 09/11/2007  . UNSPECIFIED VENOUS INSUFFICIENCY 08/29/2007    Aiyden Lauderback Nilda Simmer PT, MPH  01/30/2020, 6:13 PM  [facility] [place], [place], [address] Phone: [phone]   Fax:  [phone]  Name: Wanda Gonzales MRN: [id number] Date of Birth: 27-Oct-1955  [facility] [place], [place], [address] Phone: [phone]   Fax:  [phone]  Physical Therapy Evaluation  Patient Details  Name: Wanda Gonzales MRN: [id number] Date of Birth: 1955/10/20 Referring Provider (PT): Dr Gaynelle Arabian   Encounter Date: 01/30/2020  PT End of Session - 01/30/20 1529    Visit Number  1    Number of Visits  12    Date for PT Re-Evaluation  03/12/20    PT Start Time  1450    PT Stop Time  1540   MHP 15 min   PT Time Calculation (min)  50 min    Activity Tolerance  Patient tolerated treatment well    Behavior During Therapy  [facility] for tasks assessed/performed       Past Medical History:  Diagnosis Date  . Abnormal Pap smear, atypical squamous cells of undetermined sign (ASC-US) 1998   treated with cryo, CIN I  . ADD (attention deficit disorder) 02-23-12   easily distracted.  . Arthritis 02-23-12   Rt. hip and lt. arm  . Kidney calculi 07/2015  . Kidney stone   . PONV (postoperative nausea and vomiting) 02-23-12   always has PONV  . Post-nasal drainage 02-23-12   frequent a problem,causes a cough and mucus buildup in throat  . Urethral polyp 1998   Dr. Hartley Barefoot  . Varicose veins   . Venous insufficiency    chronic-denies any problems    Past Surgical History:  Procedure Laterality Date  . blood clot removed Left 01/2012   blood clot removed from left hand   . BREAST BIOPSY Right   . BREAST SURGERY  02-23-12   Rt. needle biopsy, recent -negative  path showed fibroadenoma  . COLONOSCOPY  08/2003   repeat 5 yrs.  . COLONOSCOPY W/ BIOPSIES  12/2008   1 polyp recheck 5 yrs  . EXCISION/RELEASE BURSA HIP  02/29/12  . HAND SURGERY  02-23-12   "blood clot" excised palm left hand 01-18-12  . KNEE ARTHROSCOPY W/ MENISCAL REPAIR  10/2012  . KNEE ARTHROSCOPY WITH EXCISION BAKER'S CYST Left 10/2012   meniscal repair  . ORIF METATARSAL FRACTURE Left 04/07/2017   Left 5th Metatarsal  . SHOULDER  SURGERY Right 02/2015   Dr. Lennette Bihari Supple - [facility]  . 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   Subjective Assessment - 01/30/20 1455    Subjective  Patient reports that she has pain in the Lt hip which has been hurting for the past 2-3 months with no known injury.    Pertinent History  Lt THA ~ 21 yrs ago; Lt TKA; fx Lt foot with ORIF plate and screws; osteoarthritis; oseoporosis    Diagnostic tests  MRI (-) Lt hip    Patient Stated Goals  get rid of the Lt hip pain    Currently in Pain?  Yes    Pain Score  4     Pain Location  Hip    Pain Orientation  Left;Posterior;Anterior    Pain Descriptors / Indicators  Aching;Throbbing;Sore    Pain Type  Acute pain;Chronic pain    Pain Radiating Towards  into Lt groin; Lt knee    Pain Onset  More than a month ago    Pain Frequency  Constant    Aggravating Factors   lying on Lt side; walking; up and down  [facility] [place], [place], [address] Phone: [phone]   Fax:  [phone]  Physical Therapy Evaluation  Patient Details  Name: Wanda Gonzales MRN: [id number] Date of Birth: 1955/10/20 Referring Provider (PT): Dr Gaynelle Arabian   Encounter Date: 01/30/2020  PT End of Session - 01/30/20 1529    Visit Number  1    Number of Visits  12    Date for PT Re-Evaluation  03/12/20    PT Start Time  1450    PT Stop Time  1540   MHP 15 min   PT Time Calculation (min)  50 min    Activity Tolerance  Patient tolerated treatment well    Behavior During Therapy  [facility] for tasks assessed/performed       Past Medical History:  Diagnosis Date  . Abnormal Pap smear, atypical squamous cells of undetermined sign (ASC-US) 1998   treated with cryo, CIN I  . ADD (attention deficit disorder) 02-23-12   easily distracted.  . Arthritis 02-23-12   Rt. hip and lt. arm  . Kidney calculi 07/2015  . Kidney stone   . PONV (postoperative nausea and vomiting) 02-23-12   always has PONV  . Post-nasal drainage 02-23-12   frequent a problem,causes a cough and mucus buildup in throat  . Urethral polyp 1998   Dr. Hartley Barefoot  . Varicose veins   . Venous insufficiency    chronic-denies any problems    Past Surgical History:  Procedure Laterality Date  . blood clot removed Left 01/2012   blood clot removed from left hand   . BREAST BIOPSY Right   . BREAST SURGERY  02-23-12   Rt. needle biopsy, recent -negative  path showed fibroadenoma  . COLONOSCOPY  08/2003   repeat 5 yrs.  . COLONOSCOPY W/ BIOPSIES  12/2008   1 polyp recheck 5 yrs  . EXCISION/RELEASE BURSA HIP  02/29/12  . HAND SURGERY  02-23-12   "blood clot" excised palm left hand 01-18-12  . KNEE ARTHROSCOPY W/ MENISCAL REPAIR  10/2012  . KNEE ARTHROSCOPY WITH EXCISION BAKER'S CYST Left 10/2012   meniscal repair  . ORIF METATARSAL FRACTURE Left 04/07/2017   Left 5th Metatarsal  . SHOULDER  SURGERY Right 02/2015   Dr. Lennette Bihari Supple - [facility]  . 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   Subjective Assessment - 01/30/20 1455    Subjective  Patient reports that she has pain in the Lt hip which has been hurting for the past 2-3 months with no known injury.    Pertinent History  Lt THA ~ 21 yrs ago; Lt TKA; fx Lt foot with ORIF plate and screws; osteoarthritis; oseoporosis    Diagnostic tests  MRI (-) Lt hip    Patient Stated Goals  get rid of the Lt hip pain    Currently in Pain?  Yes    Pain Score  4     Pain Location  Hip    Pain Orientation  Left;Posterior;Anterior    Pain Descriptors / Indicators  Aching;Throbbing;Sore    Pain Type  Acute pain;Chronic pain    Pain Radiating Towards  into Lt groin; Lt knee    Pain Onset  More than a month ago    Pain Frequency  Constant    Aggravating Factors   lying on Lt side; walking; up and down

## 2020-01-30 NOTE — Patient Instructions (Signed)
Access Code: [email or web address]  URL: [email or web address]  Date: 05/13/2021Prepared by: Sen[place]  Prone Quadriceps Stretch with Strap - 2 x daily - 7 x weekly - 1 sets - 3 reps - 20-30 hold  Hip Flexor Stretch at Edge of Bed - 2 x daily - 7 x weekly - 1 sets - 3 reps - 20-30 hold  Hip Adductors and Hamstring Stretch with Strap - 2 x daily - 7 x weekly - 1 sets - 2-3 reps - 20-30 hold

## 2020-02-03 ENCOUNTER — Encounter: Payer: Self-pay | Admitting: Rehabilitative and Restorative Service Providers"

## 2020-02-03 ENCOUNTER — Ambulatory Visit (INDEPENDENT_AMBULATORY_CARE_PROVIDER_SITE_OTHER): Payer: BC Managed Care – PPO | Admitting: Rehabilitative and Restorative Service Providers"

## 2020-02-03 ENCOUNTER — Other Ambulatory Visit: Payer: Self-pay

## 2020-02-03 DIAGNOSIS — R2689 Other abnormalities of gait and mobility: Secondary | ICD-10-CM | POA: Diagnosis not present

## 2020-02-03 DIAGNOSIS — M25552 Pain in left hip: Secondary | ICD-10-CM

## 2020-02-03 DIAGNOSIS — R29898 Other symptoms and signs involving the musculoskeletal system: Secondary | ICD-10-CM | POA: Diagnosis not present

## 2020-02-03 NOTE — Patient Instructions (Signed)
Kinesiology tape What is kinesiology tape?  There are many brands of kinesiology tape.  KTape, [employer], [employer], Dynamic tape, to name a few. It is an elasticized tape designed to support the body's natural healing process. This tape provides stability and support to muscles and joints without restricting motion. It can also help decrease swelling in the area of application. How does it work? The tape microscopically lifts and decompresses the skin to allow for drainage of lymph (swelling) to flow away from area, reducing inflammation.  The tape has the ability to help re-educate the neuromuscular system by targeting specific receptors in the skin.  The presence of the tape increases the body's awareness of posture and body mechanics.  Do not use with: . Open wounds . Skin lesions . Adhesive allergies Safe removal of the tape: In some rare cases, mild/moderate skin irritation can occur.  This can include redness, itchiness, or hives. If this occurs, immediately remove tape and consult your primary care physician if symptoms are severe or do not resolve within 2 days.  To remove tape safely, hold nearby skin with one hand and gentle roll tape down with other hand.  You can apply oil or conditioner to tape while in shower prior to removal to loosen adhesive.  DO NOT swiftly rip tape off like a band-aid, as this could cause skin tears and additional skin irritation.    IONTOPHORESIS PATIENT PRECAUTIONS & CONTRAINDICATIONS:  . Redness under one or both electrodes can occur.  This characterized by a uniform redness that usually disappears within 12 hours of treatment. . Small pinhead size blisters may result in response to the drug.  Contact your physician if the problem persists more than 24 hours. . On rare occasions, iontophoresis therapy can result in temporary skin reactions such as rash, inflammation, irritation or burns.  The skin reactions may be the result of individual sensitivity to the  ionic solution used, the condition of the skin at the start of treatment, reaction to the materials in the electrodes, allergies or sensitivity to dexamethasone, or a poor connection between the patch and your skin.  Discontinue using iontophoresis if you have any of these reactions and report to your therapist. . Remove the Patch or electrodes if you have any undue sensation of pain or burning during the treatment and report discomfort to your therapist. . Tell your Therapist if you have had known adverse reactions to the application of electrical current. . If using the Patch, the LED light will turn off when treatment is complete and the patch can be removed.  Approximate treatment time is 1-3 hours.  Remove the patch when light goes off or after 6 hours. . The Patch can be worn during normal activity, however excessive motion where the electrodes have been placed can cause poor contact between the skin and the electrode or uneven electrical current resulting in greater risk of skin irritation. Marland Kitchen Keep out of the reach of children.   . DO NOT use if you have a cardiac pacemaker or any other electrically sensitive implanted device. . DO NOT use if you have a known sensitivity to dexamethasone. . DO NOT use during Magnetic Resonance Imaging (MRI). . DO NOT use over broken or compromised skin (e.g. sunburn, cuts, or acne) due to the increased risk of skin reaction. . DO NOT SHAVE over the area to be treated:  To establish good contact between the Patch and the skin, excessive hair may be clipped. . DO NOT  place the Patch or electrodes on or over your eyes, directly over your heart, or brain. . DO NOT reuse the Patch or electrodes as this may cause burns to occur.   Standing with back to wall  Tighten core and push hips away from wall hold 5 sec to 10 sec x 10 reps   Self massage with rolling pin and ~4 inch plastic ball

## 2020-02-03 NOTE — Therapy (Signed)
HYPERLIPIDEMIA 09/17/2007  . CERVICAL POLYP 09/17/2007  . Attention deficit disorder 09/11/2007  . UNSPECIFIED VENOUS INSUFFICIENCY 08/29/2007    Kaileigh Viswanathan Nilda Simmer PT, MPH  02/03/2020, 4:49 PM  [facility] [place], [place], [address] Phone: [phone]   Fax:  [phone]  Name: Wanda Gonzales MRN: [id number] Date of Birth: 04-May-1956  HYPERLIPIDEMIA 09/17/2007  . CERVICAL POLYP 09/17/2007  . Attention deficit disorder 09/11/2007  . UNSPECIFIED VENOUS INSUFFICIENCY 08/29/2007    Kaileigh Viswanathan Nilda Simmer PT, MPH  02/03/2020, 4:49 PM  [facility] [place], [place], [address] Phone: [phone]   Fax:  [phone]  Name: Wanda Gonzales MRN: [id number] Date of Birth: 04-May-1956  HYPERLIPIDEMIA 09/17/2007  . CERVICAL POLYP 09/17/2007  . Attention deficit disorder 09/11/2007  . UNSPECIFIED VENOUS INSUFFICIENCY 08/29/2007    Kaileigh Viswanathan Nilda Simmer PT, MPH  02/03/2020, 4:49 PM  [facility] [place], [place], [address] Phone: [phone]   Fax:  [phone]  Name: Wanda Gonzales MRN: [id number] Date of Birth: 04-May-1956  [place], [place], [address] Phone: [phone]   Fax:  [phone]  Physical Therapy Treatment  Patient Details  Name: Wanda Gonzales MRN: [id number] Date of Birth: [birth year] Referring Provider (PT): Dr Gaynelle Arabian   Encounter Date: 02/03/2020  PT End of Session - 02/03/20 1532    Visit Number  2    Number of Visits  12    Date for PT Re-Evaluation  03/12/20    PT Start Time  [id number]    PT Stop Time  1612   moist heat end of treatment   PT Time Calculation (min)  56 min    Activity Tolerance  Patient tolerated treatment well    Behavior During Therapy  [facility] for tasks assessed/performed       Past Medical History:  Diagnosis Date  . Abnormal Pap smear, atypical squamous cells of undetermined sign (ASC-US) 1998   treated with cryo, CIN I  . ADD (attention deficit disorder) 02-23-12   easily distracted.  . Arthritis 02-23-12   Rt. hip and lt. arm  . Kidney calculi 07/2015  . Kidney stone   . PONV (postoperative nausea and vomiting) 02-23-12   always has PONV  . Post-nasal drainage 02-23-12   frequent a problem,causes a cough and mucus buildup in throat  . Urethral polyp 1998   Dr. Hartley Barefoot  . Varicose veins   . Venous insufficiency    chronic-denies any problems    Past Surgical History:  Procedure Laterality Date  . blood clot removed Left 01/2012   blood clot removed from left hand   . BREAST BIOPSY Right   . BREAST SURGERY  02-23-12   Rt. needle biopsy, recent -negative  path showed fibroadenoma  . COLONOSCOPY  08/2003   repeat 5 yrs.  . COLONOSCOPY W/ BIOPSIES  12/2008   1 polyp recheck 5 yrs  . EXCISION/RELEASE BURSA HIP  02/29/12  . HAND SURGERY  02-23-12   "blood clot" excised palm left hand 01-18-12  . KNEE ARTHROSCOPY W/ MENISCAL REPAIR  10/2012  . KNEE ARTHROSCOPY WITH EXCISION BAKER'S CYST Left 10/2012   meniscal repair  . ORIF METATARSAL FRACTURE Left 04/07/2017   Left 5th  Metatarsal  . SHOULDER SURGERY Right 02/2015   Dr. Lennette Bihari Supple - [facility]  . 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  Subjective Assessment - 02/03/20 1533    Subjective  Patient reports that she has had increased pain in the Lt knee; Lt and Rt hip yesterday and today. She could not sleep last night due to pain. Hurts Lt knee to stretch quad.    Currently in Pain?  Yes    Pain Score  8     Pain Location  Hip    Pain Orientation  Left;Right;Posterior;Anterior    Pain Descriptors / Indicators  Throbbing;Aching;Sore    Pain Type  Acute pain;Chronic pain    Pain Radiating Towards  into the Lt groin; Lt knee; into the outside of the Rt thigh and leg today    Pain Onset  More than a month ago    Pain Frequency  Constant                        [facility] Adult PT Treatment/Exercise - 02/03/20 0001      Self-Care   Self-Care  --

## 2020-02-07 ENCOUNTER — Ambulatory Visit: Payer: BC Managed Care – PPO | Admitting: Physical Therapy

## 2020-02-07 ENCOUNTER — Other Ambulatory Visit: Payer: Self-pay

## 2020-02-07 DIAGNOSIS — R2689 Other abnormalities of gait and mobility: Secondary | ICD-10-CM | POA: Diagnosis not present

## 2020-02-07 DIAGNOSIS — M25552 Pain in left hip: Secondary | ICD-10-CM | POA: Diagnosis not present

## 2020-02-07 DIAGNOSIS — R29898 Other symptoms and signs involving the musculoskeletal system: Secondary | ICD-10-CM

## 2020-02-07 NOTE — Therapy (Signed)
[place], [place], [address] Phone: [phone]   Fax:  [phone]  Physical Therapy Treatment  Patient Details  Name: Wanda Gonzales MRN: [id number] Date of Birth: Mar 16, 1956 Referring Provider (PT): Dr Gaynelle Arabian   Encounter Date: 02/07/2020  PT End of Session - 02/07/20 1530    Visit Number  3    Number of Visits  12    Date for PT Re-Evaluation  03/12/20    PT Start Time  1449    PT Stop Time  1525    PT Time Calculation (min)  36 min    Activity Tolerance  Patient tolerated treatment well    Behavior During Therapy  [facility] for tasks assessed/performed       Past Medical History:  Diagnosis Date  . Abnormal Pap smear, atypical squamous cells of undetermined sign (ASC-US) 1998   treated with cryo, CIN I  . ADD (attention deficit disorder) 02-23-12   easily distracted.  . Arthritis 02-23-12   Rt. hip and lt. arm  . Kidney calculi 07/2015  . Kidney stone   . PONV (postoperative nausea and vomiting) 02-23-12   always has PONV  . Post-nasal drainage 02-23-12   frequent a problem,causes a cough and mucus buildup in throat  . Urethral polyp 1998   Dr. Hartley Barefoot  . Varicose veins   . Venous insufficiency    chronic-denies any problems    Past Surgical History:  Procedure Laterality Date  . blood clot removed Left 01/2012   blood clot removed from left hand   . BREAST BIOPSY Right   . BREAST SURGERY  02-23-12   Rt. needle biopsy, recent -negative  path showed fibroadenoma  . COLONOSCOPY  08/2003   repeat 5 yrs.  . COLONOSCOPY W/ BIOPSIES  12/2008   1 polyp recheck 5 yrs  . EXCISION/RELEASE BURSA HIP  02/29/12  . HAND SURGERY  02-23-12   "blood clot" excised palm left hand 01-18-12  . KNEE ARTHROSCOPY W/ MENISCAL REPAIR  10/2012  . KNEE ARTHROSCOPY WITH EXCISION BAKER'S CYST Left 10/2012   meniscal repair  . ORIF METATARSAL FRACTURE Left 04/07/2017   Left 5th Metatarsal  . SHOULDER SURGERY Right  02/2015   Dr. Lennette Bihari Supple - [facility]  . 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  Subjective Assessment - 02/07/20 1456    Subjective  Pt reports she loved the tape applied to the knee. She has walked various miles this week (up to 40miles).  Hip feels better, but back is hurting.    Pertinent History  Lt THA ~ 21 yrs ago; Lt TKA; fx Lt foot with ORIF plate and screws; osteoarthritis; oseoporosis    Currently in Pain?  Yes    Pain Score  4     Pain Location  Back    Pain Orientation  Right;Left;Lower    Pain Descriptors / Indicators  Aching;Sore    Aggravating Factors   bending over at school         [facility] PT Assessment - 02/07/20 0001      Assessment   Medical Diagnosis  Lt hip trochanteric bursitis     Referring Provider (PT)  Dr Gaynelle Arabian    Onset Date/Surgical Date  10/30/19    Hand Dominance  Right    Next MD Visit  PRN  Prior Therapy  here for hip pain and back pain       [facility] Adult PT Treatment/Exercise - 02/07/20 0001      Lumbar Exercises: Stretches   Passive Hamstring Stretch  Left;4 reps;20 seconds;Right;1 rep    Passive Hamstring Stretch Limitations  2 reps in seated, 2 reps in supine wiht strap     Hip Flexor Stretch  Left;3 reps;Right;2 reps;30 seconds   seated    ITB Stretch Limitations  During HS stretch, leg brought over into adduction for some ITB stretch x 20 sec     Piriformis Stretch  Left;1 rep;20 seconds   trial in sitting; very tender   Other Lumbar Stretch Exercise  supine adductor stretch with strap x 20 sec x 2      Lumbar Exercises: Aerobic   Nustep  L4 x 5 min       Iontophoresis   Type of Iontophoresis  Dexamethasone    Location  Lt lateral hip     Dose  120 mAmp; 1 cc    Time  12 hours       Manual Therapy   Manual therapy comments  pt in prone with pillow under Lt knee     Soft tissue mobilization  STM to Lt prox calf and Lt  lateral hamstring to decrease fascial tightness and improve mobility.     Kinesiotex  --   I strip of sensitive skin tape applied 25% stretch L lat kne              PT Short Term Goals - 01/30/20 1808      PT SHORT TERM GOAL #1   Title  I with initial HEP    Time  4    Period  Weeks    Status  New    Target Date  02/27/20      PT SHORT TERM GOAL #2   Title  report hip pain decrease =/> 25-50% allowing patient to walk without pain for short distances    Time  4    Period  Weeks    Status  New    Target Date  02/27/20      PT SHORT TERM GOAL #3   Title  Increase Lt knee flexion to 100 deg flexion assessed in prone    Baseline  -    Time  4    Status  New      PT SHORT TERM GOAL #4   Title  patient reports improved ability to roll and turn in bed    Time  4    Period  Weeks    Status  New    Target Date  02/27/20        PT Long Term Goals - 01/30/20 1530      PT LONG TERM GOAL #1   Title  Decrease pain and tightness through the Lt lower quarter with palpation - increase tissue extensibility    Time  6    Period  Weeks    Status  New    Target Date  03/12/20      PT LONG TERM GOAL #2   Title  5/5 strength Lt LE    Baseline  -    Time  6    Period  Weeks    Status  New    Target Date  03/12/20      PT LONG TERM GOAL #3   Title  patient to report return to pain free  ambulation for 15-20 of walking    Time  6    Period  Weeks    Status  New    Target Date  03/12/20      PT LONG TERM GOAL #4   Title  Independent in HEP    Time  6    Period  Weeks    Status  New    Target Date  03/12/20      PT LONG TERM GOAL #5   Title  Inprove FOTO to </= 35% limitation    Time  6    Period  Weeks    Status  New    Target Date  03/12/20            Plan - 02/07/20 1636    Clinical Impression Statement  Positive response to ktape on Lt knee and ionto patch on Lt hip; repeated today.  Pt tolerated seated hip flexor/quad stretch well.  Some palpable  tightness and tenderness in Lt lateral distal hamstring; reduced with STM.  Pt will be a good candidate for pool therapy/ aquatic exercise in future.    Rehab Potential  Good    PT Frequency  2x / week    PT Duration  6 weeks    PT Treatment/Interventions  Patient/family education;ADLs/Self Care Home Management;Cryotherapy;Electrical Stimulation;Iontophoresis 4mg /ml Dexamethasone;Moist Heat;Ultrasound;Gait training;Stair training;Therapeutic activities;Therapeutic exercise;Balance training;Neuromuscular re-education;Manual techniques;Dry needling;Taping    PT Next Visit Plan  progress with stretching hip flexors/quads/adductors; modalities as indicated    PT Home Exercise Plan  [id number]    Consulted and Agree with Plan of Care  Patient       Patient will benefit from skilled therapeutic intervention in order to improve the following deficits and impairments:  Decreased range of motion, Increased fascial restricitons, Pain, Impaired flexibility, Improper body mechanics, Decreased mobility, Decreased strength  Visit Diagnosis: Pain in left hip  Other symptoms and signs involving the musculoskeletal system  Weakness of proximal end of lower extremity  Other abnormalities of gait and mobility     Problem List Patient Active Problem List   Diagnosis Date Noted  . Seborrheic keratosis 11/23/2017  . Closed displaced fracture of fifth metatarsal bone of left foot 03/31/2017  . 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 Baker's cyst of knee, left 08/25/2016  . Benign paroxysmal positional vertigo 08/25/2016  . Back pain 12/16/2015  . Cervical strain 11/21/2014  . Right shoulder pain 05/15/2014  . DDD (degenerative disc disease), lumbar 02/17/2014  . Unspecified vitamin D deficiency 03/18/2013  . Trochanteric bursitis of right hip 02/29/2012  . INSOMNIA 04/13/2010  . HYPERLIPIDEMIA 09/17/2007  . CERVICAL POLYP 09/17/2007  . Attention deficit disorder 09/11/2007  .  UNSPECIFIED VENOUS INSUFFICIENCY 08/29/2007   Kerin Perna, PTA 02/07/20 4:41 PM  [place], [place], [address] Phone: [phone]   Fax:  [phone]  Name: Wanda Gonzales MRN: [id number] Date of Birth: 06-01-1956

## 2020-02-13 ENCOUNTER — Ambulatory Visit (INDEPENDENT_AMBULATORY_CARE_PROVIDER_SITE_OTHER): Payer: BC Managed Care – PPO | Admitting: Physical Therapy

## 2020-02-13 ENCOUNTER — Other Ambulatory Visit: Payer: Self-pay

## 2020-02-13 ENCOUNTER — Encounter: Payer: Self-pay | Admitting: Physical Therapy

## 2020-02-13 DIAGNOSIS — M25552 Pain in left hip: Secondary | ICD-10-CM | POA: Diagnosis not present

## 2020-02-13 DIAGNOSIS — R29898 Other symptoms and signs involving the musculoskeletal system: Secondary | ICD-10-CM

## 2020-02-13 DIAGNOSIS — R2689 Other abnormalities of gait and mobility: Secondary | ICD-10-CM | POA: Diagnosis not present

## 2020-02-13 NOTE — Therapy (Signed)
[place], [place], [address] Phone: [phone]   Fax:  [phone]  Physical Therapy Treatment  Patient Details  Name: Wanda Gonzales MRN: [id number] Date of Birth: 09-03-1956 Referring Provider (PT): Dr Gaynelle Arabian   Encounter Date: 02/13/2020  PT End of Session - 02/13/20 1534    Visit Number  4    Number of Visits  12    Date for PT Re-Evaluation  03/12/20    PT Start Time  [id number]    PT Stop Time  1615    PT Time Calculation (min)  41 min    Activity Tolerance  Patient tolerated treatment well    Behavior During Therapy  [facility] for tasks assessed/performed       Past Medical History:  Diagnosis Date  . Abnormal Pap smear, atypical squamous cells of undetermined sign (ASC-US) 1998   treated with cryo, CIN I  . ADD (attention deficit disorder) 02-23-12   easily distracted.  . Arthritis 02-23-12   Rt. hip and lt. arm  . Kidney calculi 07/2015  . Kidney stone   . PONV (postoperative nausea and vomiting) 02-23-12   always has PONV  . Post-nasal drainage 02-23-12   frequent a problem,causes a cough and mucus buildup in throat  . Urethral polyp 1998   Dr. Hartley Barefoot  . Varicose veins   . Venous insufficiency    chronic-denies any problems    Past Surgical History:  Procedure Laterality Date  . blood clot removed Left 01/2012   blood clot removed from left hand   . BREAST BIOPSY Right   . BREAST SURGERY  02-23-12   Rt. needle biopsy, recent -negative  path showed fibroadenoma  . COLONOSCOPY  08/2003   repeat 5 yrs.  . COLONOSCOPY W/ BIOPSIES  12/2008   1 polyp recheck 5 yrs  . EXCISION/RELEASE BURSA HIP  02/29/12  . HAND SURGERY  02-23-12   "blood clot" excised palm left hand 01-18-12  . KNEE ARTHROSCOPY W/ MENISCAL REPAIR  10/2012  . KNEE ARTHROSCOPY WITH EXCISION BAKER'S CYST Left 10/2012   meniscal repair  . ORIF METATARSAL FRACTURE Left 04/07/2017   Left 5th Metatarsal  . SHOULDER SURGERY Right  02/2015   Dr. Lennette Bihari Supple - [facility]  . 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  Subjective Assessment - 02/13/20 1534    Subjective  Pt reports she has been doing her stretches.  She walked 1.5 miles yesterday; had to stop 2x due to increased pain in her low back (up to 7/10).    Patient Stated Goals  get rid of the Lt hip pain    Currently in Pain?  No/denies    Pain Score  0-No pain         [facility] PT Assessment - 02/13/20 0001      Assessment   Medical Diagnosis  Lt hip trochanteric bursitis     Referring Provider (PT)  Dr Gaynelle Arabian    Onset Date/Surgical Date  10/30/19    Hand Dominance  Right    Next MD Visit  PRN     Prior Therapy  here for hip pain and back pain         [facility] Adult PT Treatment/Exercise - 02/13/20 0001      Lumbar Exercises: Stretches   Passive Hamstring Stretch  Left;2 reps;30 seconds  supine with strap   Hip Flexor Stretch  Left;3 reps;20 seconds    Quad Stretch  Left;1 rep;10 seconds   trial with PTA assist; not tolerated   ITB Stretch  Left;2 reps;10 seconds    Other Lumbar Stretch Exercise  supine adductor stretch with strap x 10 sec x 2    Other Lumbar Stretch Exercise  supine butterfly stretch x 30 sec       Lumbar Exercises: Aerobic   Nustep  L4: 5 min      Lumbar Exercises: Standing   Other Standing Lumbar Exercises  side stepping 10 ft with red band at ankles; cues to slow pace, control movement of feet.       Lumbar Exercises: Supine   Bridge  10 reps   with strap around thighs.      Lumbar Exercises: Sidelying   Hip Abduction  Left;10 reps   fatigues, pain in Lt knee     Iontophoresis   Type of Iontophoresis  Dexamethasone    Location  Lt lateral hip     Dose  120 mAmp; 1 cc    Time  12 hours       Manual Therapy   Soft tissue mobilization  STM to Lt glute med/min, piriformis, hamstring to reduce fascial restrictions                 PT Short Term Goals - 01/30/20 1808      PT SHORT TERM GOAL #1   Title  I with initial HEP    Time  4    Period  Weeks    Status  New    Target Date  02/27/20      PT SHORT TERM GOAL #2   Title  report hip pain decrease =/> 25-50% allowing patient to walk without pain for short distances    Time  4    Period  Weeks    Status  New    Target Date  02/27/20      PT SHORT TERM GOAL #3   Title  Increase Lt knee flexion to 100 deg flexion assessed in prone    Baseline  -    Time  4    Status  New      PT SHORT TERM GOAL #4   Title  patient reports improved ability to roll and turn in bed    Time  4    Period  Weeks    Status  New    Target Date  02/27/20        PT Long Term Goals - 01/30/20 1530      PT LONG TERM GOAL #1   Title  Decrease pain and tightness through the Lt lower quarter with palpation - increase tissue extensibility    Time  6    Period  Weeks    Status  New    Target Date  03/12/20      PT LONG TERM GOAL #2   Title  5/5 strength Lt LE    Baseline  -    Time  6    Period  Weeks    Status  New    Target Date  03/12/20      PT LONG TERM GOAL #3   Title  patient to report return to pain free ambulation for 15-20 of walking    Time  6    Period  Weeks    Status  New    Target Date  03/12/20      PT LONG TERM GOAL #4   Title  Independent in HEP    Time  6    Period  Weeks    Status  New    Target Date  03/12/20      PT LONG TERM GOAL #5   Title  Inprove FOTO to </= 35% limitation    Time  6    Period  Weeks    Status  New    Target Date  03/12/20            Plan - 02/13/20 1740    Clinical Impression Statement  Overall improvement in Lt lateral hip pain; less tender with palpation.  Pt was tender in glute min with STM; slightly reduced after STM.  Pt unable to tolerate more than 5 reps of sidelying hip abdct as this increases knee pain; resisted standing hip abdct is preferred exercise. Continued difficulty  tolerating prone quad stretch due to increased pain in the Lt knee. Pt making gains towards goals LTGs.    Rehab Potential  Good    PT Frequency  2x / week    PT Duration  6 weeks    PT Treatment/Interventions  Patient/family education;ADLs/Self Care Home Management;Cryotherapy;Electrical Stimulation;Iontophoresis 4mg /ml Dexamethasone;Moist Heat;Ultrasound;Gait training;Stair training;Therapeutic activities;Therapeutic exercise;Balance training;Neuromuscular re-education;Manual techniques;Dry needling;Taping    PT Next Visit Plan  progress with stretching hip flexors/quads/adductors; add resisted side stepping to HEP. modalities as indicated    PT Home Exercise Plan  [id number]    Consulted and Agree with Plan of Care  Patient       Patient will benefit from skilled therapeutic intervention in order to improve the following deficits and impairments:  Decreased range of motion, Increased fascial restricitons, Pain, Impaired flexibility, Improper body mechanics, Decreased mobility, Decreased strength  Visit Diagnosis: Pain in left hip  Other symptoms and signs involving the musculoskeletal system  Weakness of proximal end of lower extremity  Other abnormalities of gait and mobility     Problem List Patient Active Problem List   Diagnosis Date Noted  . Seborrheic keratosis 11/23/2017  . Closed displaced fracture of fifth metatarsal bone of left foot 03/31/2017  . 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 Baker's cyst of knee, left 08/25/2016  . Benign paroxysmal positional vertigo 08/25/2016  . Back pain 12/16/2015  . Cervical strain 11/21/2014  . Right shoulder pain 05/15/2014  . DDD (degenerative disc disease), lumbar 02/17/2014  . Unspecified vitamin D deficiency 03/18/2013  . Trochanteric bursitis of right hip 02/29/2012  . INSOMNIA 04/13/2010  . HYPERLIPIDEMIA 09/17/2007  . CERVICAL POLYP 09/17/2007  . Attention deficit disorder 09/11/2007  . UNSPECIFIED VENOUS  INSUFFICIENCY 08/29/2007   Kerin Perna, PTA 02/13/20 5:49 PM   [place], [place], [address] Phone: [phone]   Fax:  [phone]  Name: Wanda Gonzales MRN: [id number] Date of Birth: 05/07/1956

## 2020-02-14 ENCOUNTER — Encounter: Payer: BC Managed Care – PPO | Admitting: Physical Therapy

## 2020-02-18 ENCOUNTER — Other Ambulatory Visit: Payer: Self-pay

## 2020-02-18 ENCOUNTER — Encounter: Payer: Self-pay | Admitting: Physical Therapy

## 2020-02-18 ENCOUNTER — Ambulatory Visit (INDEPENDENT_AMBULATORY_CARE_PROVIDER_SITE_OTHER): Payer: BC Managed Care – PPO | Admitting: Physical Therapy

## 2020-02-18 DIAGNOSIS — R29898 Other symptoms and signs involving the musculoskeletal system: Secondary | ICD-10-CM | POA: Diagnosis not present

## 2020-02-18 DIAGNOSIS — R2689 Other abnormalities of gait and mobility: Secondary | ICD-10-CM

## 2020-02-18 DIAGNOSIS — M25552 Pain in left hip: Secondary | ICD-10-CM | POA: Diagnosis not present

## 2020-02-18 NOTE — Therapy (Signed)
[facility] [place], [place], [address] Phone: [phone]   Fax:  [phone]  Physical Therapy Treatment  Patient Details  Name: Wanda Gonzales MRN: [id number] Date of Birth: Jun 26, 1956 Referring Provider (PT): Dr Gaynelle Arabian   Encounter Date: 02/18/2020  PT End of Session - 02/18/20 1101    Visit Number  5    Number of Visits  12    Date for PT Re-Evaluation  03/12/20    PT Start Time  1018    PT Stop Time  1058    PT Time Calculation (min)  40 min    Activity Tolerance  Patient tolerated treatment well    Behavior During Therapy  [facility] for tasks assessed/performed       Past Medical History:  Diagnosis Date  . Abnormal Pap smear, atypical squamous cells of undetermined sign (ASC-US) 1998   treated with cryo, CIN I  . ADD (attention deficit disorder) 02-23-12   easily distracted.  . Arthritis 02-23-12   Rt. hip and lt. arm  . Kidney calculi 07/2015  . Kidney stone   . PONV (postoperative nausea and vomiting) 02-23-12   always has PONV  . Post-nasal drainage 02-23-12   frequent a problem,causes a cough and mucus buildup in throat  . Urethral polyp 1998   Dr. Hartley Barefoot  . Varicose veins   . Venous insufficiency    chronic-denies any problems    Past Surgical History:  Procedure Laterality Date  . blood clot removed Left 01/2012   blood clot removed from left hand   . BREAST BIOPSY Right   . BREAST SURGERY  02-23-12   Rt. needle biopsy, recent -negative  path showed fibroadenoma  . COLONOSCOPY  08/2003   repeat 5 yrs.  . COLONOSCOPY W/ BIOPSIES  12/2008   1 polyp recheck 5 yrs  . EXCISION/RELEASE BURSA HIP  02/29/12  . HAND SURGERY  02-23-12   "blood clot" excised palm left hand 01-18-12  . KNEE ARTHROSCOPY W/ MENISCAL REPAIR  10/2012  . KNEE ARTHROSCOPY WITH EXCISION BAKER'S CYST Left 10/2012   meniscal repair  . ORIF METATARSAL FRACTURE Left 04/07/2017   Left 5th Metatarsal  . SHOULDER SURGERY Right  02/2015   Dr. Lennette Bihari [facility] - [facility]  . 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  Subjective Assessment - 02/18/20 1021    Subjective  Pt reports she is doing pretty well.  She has been using her shiatsu machine to massage "every where".  She has noticed that rolling onto her Lt hip at night with less pain. She plans to check out aquatic center; wants to transition back into water exercises.   Pertinent History  Lt THA ~ 21 yrs ago; Lt TKA; fx Lt foot with ORIF plate and screws; osteoarthritis; oseoporosis    Patient Stated Goals  get rid of the Lt hip pain    Currently in Pain?  No/denies    Pain Score  2     Pain Location  Back    Pain Orientation  Right;Left;Lower    Pain Descriptors / Indicators  Sore    Aggravating Factors   bending over to weed    Pain Relieving Factors  heat, massage.         [facility] PT Assessment - 02/18/20 0001      Assessment   Medical Diagnosis  Lt hip trochanteric bursitis     Referring Provider (PT)  Dr Gaynelle Arabian    Onset Date/Surgical Date  10/30/19    Hand Dominance  Right    Next MD Visit  PRN     Prior Therapy  here for hip pain and back pain       AROM   Right/Left Knee  --   0-131 deg     Strength   Left Hip Flexion  4+/5    Left Hip Extension  4+/5    Left Hip ABduction  4+/5      Flexibility   Quadriceps  Lt 115 (prone stretch)       [facility] Adult PT Treatment/Exercise - 02/18/20 0001      Lumbar Exercises: Stretches   Passive Hamstring Stretch  Left;2 reps;30 seconds   supine with strap   Hip Flexor Stretch  Left;2 reps;60 seconds;Right;30 seconds    Quad Stretch  Left;2 reps;20 seconds   prone stretch with strap, pillow under knee   ITB Stretch  Left;2 reps;10 seconds      Lumbar Exercises: Aerobic   Nustep  L5: 5 min      Lumbar Exercises: Supine   Bridge  10 reps   with strap around thighs.      Knee/Hip Exercises:  Supine   Short Arc Quad Sets  Strengthening;Left;1 set;10 reps    Straight Leg Raises  Left;1 set;10 reps    Straight Leg Raises Limitations  cues to not plop at end.       Knee/Hip Exercises: Prone   Straight Leg Raises  Left;1 set;10 reps      Iontophoresis   Type of Iontophoresis  Dexamethasone    Location  Lt lateral hip     Dose  120 mAmp; 1 cc    Time  12 hours       Manual Therapy   Kinesiotex  --   I strip of sensitive skin tape applied 25% stretch L lat kne              PT Short Term Goals - 02/18/20 1028      PT SHORT TERM GOAL #1   Title  I with initial HEP    Time  4    Period  Weeks    Status  Achieved    Target Date  02/27/20      PT SHORT TERM GOAL #2   Title  report hip pain decrease =/> 25-50% allowing patient to walk without pain for short distances    Time  4    Period  Weeks    Status  Achieved    Target Date  02/27/20      PT SHORT TERM GOAL #3   Title  Increase Lt knee flexion to 100 deg flexion assessed in prone    Baseline  -    Time  4    Status  Achieved      PT SHORT TERM GOAL #4   Title  patient reports improved ability to roll and turn in bed    Time  4    Period  Weeks    Status  Achieved    Target Date  02/27/20        PT Long Term Goals - 02/18/20 1058      PT LONG TERM GOAL #1   Title  Decrease pain and tightness through the Lt lower quarter with palpation - increase tissue extensibility    Time  6    Period  Weeks    Status  Partially Met      PT LONG TERM GOAL #2   Title  5/5 strength Lt LE    Baseline  -    Time  6    Period  Weeks    Status  On-going      PT LONG TERM GOAL #3   Title  patient to report return to pain free ambulation for 15-20 of walking    Time  6    Period  Weeks    Status  Partially Met      PT LONG TERM GOAL #4   Title  Independent in HEP    Time  6    Period  Weeks    Status  On-going      PT LONG TERM GOAL #5   Title  Inprove FOTO to </= 35% limitation    Time  6     Period  Weeks    Status  On-going            Plan - 02/18/20 1059    Clinical Impression Statement  Improved tolerance for prone quad stretch (with pillow under knee).  Pt demo improved Lt quad flexibility.  She has met all STGs.  She reported some pain in Lt knee with SLR upon descent; resolved with rest.  Otherwise, all other exercises tolerated.    Rehab Potential  Good    PT Frequency  2x / week    PT Duration  6 weeks    PT Treatment/Interventions  Patient/family education;ADLs/Self Care Home Management;Cryotherapy;Electrical Stimulation;Iontophoresis 76m/ml Dexamethasone;Moist Heat;Ultrasound;Gait training;Stair training;Therapeutic activities;Therapeutic exercise;Balance training;Neuromuscular re-education;Manual techniques;Dry needling;Taping    PT Next Visit Plan  progress LE strengthening as tolerated. add water exercises to HEP.  modalities as indicated    PT Home Exercise Plan  [id number]    Consulted and Agree with Plan of Care  Patient       Patient will benefit from skilled therapeutic intervention in order to improve the following deficits and impairments:  Decreased range of motion, Increased fascial restricitons, Pain, Impaired flexibility, Improper body mechanics, Decreased mobility, Decreased strength  Visit Diagnosis: Pain in left hip  Other symptoms and signs involving the musculoskeletal system  Weakness of proximal end of lower extremity  Other abnormalities of gait and mobility     Problem List Patient Active Problem List   Diagnosis Date Noted  . Seborrheic keratosis 11/23/2017  . Closed displaced fracture of fifth metatarsal bone of left foot 03/31/2017  . 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 Baker's cyst of knee, left 08/25/2016  . Benign paroxysmal positional vertigo 08/25/2016  . Back pain 12/16/2015  . Cervical strain 11/21/2014  . Right shoulder pain 05/15/2014  . DDD (degenerative disc disease), lumbar 02/17/2014  .  Unspecified vitamin D deficiency 03/18/2013  . Trochanteric bursitis of right hip 02/29/2012  . INSOMNIA 04/13/2010  . HYPERLIPIDEMIA 09/17/2007  . CERVICAL POLYP 09/17/2007  . Attention deficit disorder 09/11/2007  . UNSPECIFIED VENOUS INSUFFICIENCY 08/29/2007   JKerin Perna PTA 02/18/20 11:02 AM  [address] [address]Phone: [phone]  Fax:  [phone] Name: Wanda KOLARIKMRN: [id number] of Birth: 2Jun 06, 1957

## 2020-02-20 ENCOUNTER — Ambulatory Visit: Payer: BC Managed Care – PPO | Admitting: Physical Therapy

## 2020-02-20 ENCOUNTER — Encounter: Payer: Self-pay | Admitting: Physical Therapy

## 2020-02-20 ENCOUNTER — Other Ambulatory Visit: Payer: Self-pay

## 2020-02-20 DIAGNOSIS — R2689 Other abnormalities of gait and mobility: Secondary | ICD-10-CM

## 2020-02-20 DIAGNOSIS — M25552 Pain in left hip: Secondary | ICD-10-CM | POA: Diagnosis not present

## 2020-02-20 DIAGNOSIS — R29898 Other symptoms and signs involving the musculoskeletal system: Secondary | ICD-10-CM

## 2020-02-20 NOTE — Patient Instructions (Signed)
Thoracic Lift    Press shoulders down. Then lift mid-thoracic spine (area between the shoulder blades). Lift the breastbone slightly. Hold _5__ seconds. Relax. Repeat _5-10__ times. (don't arch head back)   Leg Lengthener: Full    Straighten one leg. Pull toes AND forefoot toward knee, extend heel. Lengthen leg by pulling pelvis away from ribs. Hold _5__ seconds. Relax. Repeat 1 time. Re-bend knee. Do other leg. Each leg __5-10_ times.

## 2020-02-20 NOTE — Therapy (Signed)
[place], [place], [address] Phone: [phone]   Fax:  [phone]  Physical Therapy Treatment  Patient Details  Name: Wanda Gonzales MRN: [id number] Date of Birth: [birth year] Referring Provider (PT): Dr Gaynelle Arabian   Encounter Date: 02/20/2020  PT End of Session - 02/20/20 1021    Visit Number  6    Number of Visits  12    Date for PT Re-Evaluation  03/12/20    PT Start Time  1016    PT Stop Time  1101    PT Time Calculation (min)  45 min    Activity Tolerance  Patient tolerated treatment well;No increased pain    Behavior During Therapy  WFL for tasks assessed/performed       Past Medical History:  Diagnosis Date  . Abnormal Pap smear, atypical squamous cells of undetermined sign (ASC-US) 1998   treated with cryo, CIN I  . ADD (attention deficit disorder) 02-23-12   easily distracted.  . Arthritis 02-23-12   Rt. hip and lt. arm  . Kidney calculi 07/2015  . Kidney stone   . PONV (postoperative nausea and vomiting) 02-23-12   always has PONV  . Post-nasal drainage 02-23-12   frequent a problem,causes a cough and mucus buildup in throat  . Urethral polyp 1998   Dr. Hartley Barefoot  . Varicose veins   . Venous insufficiency    chronic-denies any problems    Past Surgical History:  Procedure Laterality Date  . blood clot removed Left 01/2012   blood clot removed from left hand   . BREAST BIOPSY Right   . BREAST SURGERY  02-23-12   Rt. needle biopsy, recent -negative  path showed fibroadenoma  . COLONOSCOPY  08/2003   repeat 5 yrs.  . COLONOSCOPY W/ BIOPSIES  12/2008   1 polyp recheck 5 yrs  . EXCISION/RELEASE BURSA HIP  02/29/12  . HAND SURGERY  02-23-12   "blood clot" excised palm left hand 01-18-12  . KNEE ARTHROSCOPY W/ MENISCAL REPAIR  10/2012  . KNEE ARTHROSCOPY WITH EXCISION BAKER'S CYST Left 10/2012   meniscal repair  . ORIF METATARSAL FRACTURE Left 04/07/2017   Left 5th Metatarsal  .  SHOULDER SURGERY Right 02/2015   Dr. Lennette Bihari Supple - [facility]  . 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  Subjective Assessment - 02/20/20 1021    Subjective  Pt reports she toured [facility] and has signed up for a month to try it out.  Her hip is feeling better, back is sore today.  May have been from lifting bag of potting soil yesterday.    Pertinent History  Lt THA ~ 21 yrs ago; Lt TKA; fx Lt foot with ORIF plate and screws; osteoarthritis; oseoporosis    Patient Stated Goals  get rid of the Lt hip pain    Currently in Pain?  Yes    Pain Score  5     Pain Location  Back    Pain Orientation  Right;Left;Lower    Pain Descriptors / Indicators  Sore         [facility] PT Assessment - 02/20/20 0001      Assessment   Medical Diagnosis  Lt hip trochanteric bursitis     Referring Provider (PT)  Dr Gaynelle Arabian    Onset Date/Surgical Date  10/30/19    Hand Dominance  Right    Next MD Visit  PRN     Prior Therapy  here for hip pain and back pain       [facility] Adult PT Treatment/Exercise - 02/20/20 0001      Lumbar Exercises: Stretches   Passive Hamstring Stretch  Right;Left;3 reps;20 seconds   supine with strap   Hip Flexor Stretch  Left;Right;2 reps;20 seconds   seated in chair   Standing Extension  1 rep;5 seconds    Prone on Elbows Stretch  2 reps;20 seconds    ITB Stretch  Left;Right;2 reps;20 seconds   supine with strap   Other Lumbar Stretch Exercise  seated, modified cat cow x 3 reps    Other Lumbar Stretch Exercise  supine butterfly stretch x 15 sec x 2 reps      Lumbar Exercises: Aerobic   Nustep  L4: 5 min      Lumbar Exercises: Standing   Shoulder Extension  Both;10 reps;Theraband    Theraband Level (Shoulder Extension)  Level 1 (Yellow);Level 2 (Red)   core engaged    Other Standing Lumbar Exercises  reviewed pool exercises (water walking forward, backward, sideways.Marland Kitchen arms  forward/backward with core engaged, leg kicks forward, side, back)     Other Standing Lumbar Exercises  standing hip ext x 10 each leg, standing hip abdct x 10 each leg with UE support on counter.       Knee/Hip Exercises: Supine   Other Supine Knee/Hip Exercises  scap squeeze x 5 sec x 5 reps;  leg lengthener/leg press x 5 sec x 5 reps each leg; repeated with arm lengthener x 5 reps each side.      Knee/Hip Exercises: Prone   Straight Leg Raises Limitations  attempted; unable to tolerate due to LBP.       Modalities   Modalities  --   will use TENS/ heat at home.               PT Short Term Goals - 02/18/20 1028      PT SHORT TERM GOAL #1   Title  I with initial HEP    Time  4    Period  Weeks    Status  Achieved    Target Date  02/27/20      PT SHORT TERM GOAL #2   Title  report hip pain decrease =/> 25-50% allowing patient to walk without pain for short distances    Time  4    Period  Weeks    Status  Achieved    Target Date  02/27/20      PT SHORT TERM GOAL #3   Title  Increase Lt knee flexion to 100 deg flexion assessed in prone    Baseline  -    Time  4    Status  Achieved      PT SHORT TERM GOAL #4   Title  patient reports improved ability to roll and turn in bed    Time  4    Period  Weeks    Status  Achieved    Target Date  02/27/20        PT Long Term Goals - 02/18/20 1058      PT LONG TERM GOAL #1   Title  Decrease pain and tightness through the Lt lower quarter with palpation - increase tissue extensibility    Time  6    Period  Weeks    Status  Partially Met  PT LONG TERM GOAL #2   Title  5/5 strength Lt LE    Baseline  -    Time  6    Period  Weeks    Status  On-going      PT LONG TERM GOAL #3   Title  patient to report return to pain free ambulation for 15-20 of walking    Time  6    Period  Weeks    Status  Partially Met      PT LONG TERM GOAL #4   Title  Independent in HEP    Time  6    Period  Weeks    Status   On-going      PT LONG TERM GOAL #5   Title  Inprove FOTO to </= 35% limitation    Time  6    Period  Weeks    Status  On-going            Plan - 02/20/20 1322    Clinical Impression Statement  Lt hip much improved since starting therapy.  Lower back pain waxes and wains depending on activities around the house. Limited tolerance for prone hip ext; slightly improved in standing. Other exercises tolerated well today, without increase in pain.  Verbally reviewed pool exercises for pt to try at pool prior to next visit. Pt has partially met her goals.  Anticipate d/c after next 2 visits.    Rehab Potential  Good    PT Frequency  2x / week    PT Duration  6 weeks    PT Treatment/Interventions  Patient/family education;ADLs/Self Care Home Management;Cryotherapy;Electrical Stimulation;Iontophoresis 77m/ml Dexamethasone;Moist Heat;Ultrasound;Gait training;Stair training;Therapeutic activities;Therapeutic exercise;Balance training;Neuromuscular re-education;Manual techniques;Dry needling;Taping    PT Next Visit Plan  progress LE strengthening as tolerated.   modalities as indicated    PT Home Exercise Plan  [id number]    Consulted and Agree with Plan of Care  Patient       Patient will benefit from skilled therapeutic intervention in order to improve the following deficits and impairments:  Decreased range of motion, Increased fascial restricitons, Pain, Impaired flexibility, Improper body mechanics, Decreased mobility, Decreased strength  Visit Diagnosis: Pain in left hip  Other symptoms and signs involving the musculoskeletal system  Weakness of proximal end of lower extremity  Other abnormalities of gait and mobility     Problem List Patient Active Problem List   Diagnosis Date Noted  . Seborrheic keratosis 11/23/2017  . Closed displaced fracture of fifth metatarsal bone of left foot 03/31/2017  . 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 Baker's cyst of knee, left  08/25/2016  . Benign paroxysmal positional vertigo 08/25/2016  . Back pain 12/16/2015  . Cervical strain 11/21/2014  . Right shoulder pain 05/15/2014  . DDD (degenerative disc disease), lumbar 02/17/2014  . Unspecified vitamin D deficiency 03/18/2013  . Trochanteric bursitis of right hip 02/29/2012  . INSOMNIA 04/13/2010  . HYPERLIPIDEMIA 09/17/2007  . CERVICAL POLYP 09/17/2007  . Attention deficit disorder 09/11/2007  . UNSPECIFIED VENOUS INSUFFICIENCY 08/29/2007   JKerin Perna PTA 02/20/20 1:28 PM  [facility] [address] [address]Phone: [phone]  Fax:  [phone] Name: Wanda DELANDMRN: [id number] of Birth: 209-26-1957

## 2020-02-27 ENCOUNTER — Ambulatory Visit (INDEPENDENT_AMBULATORY_CARE_PROVIDER_SITE_OTHER): Payer: BC Managed Care – PPO | Admitting: Physical Therapy

## 2020-02-27 ENCOUNTER — Other Ambulatory Visit: Payer: Self-pay

## 2020-02-27 ENCOUNTER — Encounter: Payer: Self-pay | Admitting: Physical Therapy

## 2020-02-27 DIAGNOSIS — M25552 Pain in left hip: Secondary | ICD-10-CM

## 2020-02-27 DIAGNOSIS — R2689 Other abnormalities of gait and mobility: Secondary | ICD-10-CM

## 2020-02-27 DIAGNOSIS — R29898 Other symptoms and signs involving the musculoskeletal system: Secondary | ICD-10-CM | POA: Diagnosis not present

## 2020-02-27 NOTE — Therapy (Signed)
[facility] [place], [place], [address] Phone: [phone]   Fax:  [phone]  Physical Therapy Treatment  Patient Details  Name: Wanda Gonzales MRN: [id number] Date of Birth: 1956-07-09 Referring Provider (PT): Dr Gaynelle Arabian   Encounter Date: 02/27/2020   PT End of Session - 02/27/20 1021    Visit Number 7    Number of Visits 12    Date for PT Re-Evaluation 03/12/20    PT Start Time 1018    PT Stop Time 1056    PT Time Calculation (min) 38 min    Activity Tolerance Patient tolerated treatment well;No increased pain    Behavior During Therapy WFL for tasks assessed/performed           Past Medical History:  Diagnosis Date  . Abnormal Pap smear, atypical squamous cells of undetermined sign (ASC-US) 1998   treated with cryo, CIN I  . ADD (attention deficit disorder) 02-23-12   easily distracted.  . Arthritis 02-23-12   Rt. hip and lt. arm  . Kidney calculi 07/2015  . Kidney stone   . PONV (postoperative nausea and vomiting) 02-23-12   always has PONV  . Post-nasal drainage 02-23-12   frequent a problem,causes a cough and mucus buildup in throat  . Urethral polyp 1998   Dr. Hartley Barefoot  . Varicose veins   . Venous insufficiency    chronic-denies any problems    Past Surgical History:  Procedure Laterality Date  . blood clot removed Left 01/2012   blood clot removed from left hand   . BREAST BIOPSY Right   . BREAST SURGERY  02-23-12   Rt. needle biopsy, recent -negative  path showed fibroadenoma  . COLONOSCOPY  08/2003   repeat 5 yrs.  . COLONOSCOPY W/ BIOPSIES  12/2008   1 polyp recheck 5 yrs  . EXCISION/RELEASE BURSA HIP  02/29/12  . HAND SURGERY  02-23-12   "blood clot" excised palm left hand 01-18-12  . KNEE ARTHROSCOPY W/ MENISCAL REPAIR  10/2012  . KNEE ARTHROSCOPY WITH EXCISION BAKER'S CYST Left 10/2012   meniscal repair  . ORIF METATARSAL FRACTURE Left 04/07/2017   Left 5th Metatarsal  .  SHOULDER SURGERY Right 02/2015   Dr. Lennette Bihari Supple - [facility]  . 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   Subjective Assessment - 02/27/20 1022    Subjective Pt reports she has been doing exercises in pool, however she missed a step when entering pool.  "I saw stars", Lt knee overstretched.  Today her LLE is sore.    Pertinent History Lt THA ~ 21 yrs ago; Lt TKA; fx Lt foot with ORIF plate and screws; osteoarthritis; oseoporosis    Patient Stated Goals get rid of the Lt hip pain    Currently in Pain? Yes    Pain Score 3     Pain Location Hip    Pain Orientation Left;Right    Pain Radiating Towards to knee    Aggravating Factors  prolonged walking    Pain Relieving Factors water exercise              [facility] PT Assessment - 02/27/20 0001      Assessment   Medical Diagnosis .    Referring Provider (PT) Dr Gaynelle Arabian    Onset Date/Surgical Date 10/30/19    Hand Dominance Right  Next MD Visit PRN     Prior Therapy here for hip pain and back pain            [facility] Adult PT Treatment/Exercise - 02/27/20 0001      Lumbar Exercises: Stretches   Passive Hamstring Stretch Left;2 reps;30 seconds   sitting, straight back, hip hinge   Piriformis Stretch Left;2 reps;Right;1 rep;30 seconds      Lumbar Exercises: Aerobic   Nustep L4: 6 min       Knee/Hip Exercises: Machines for Strengthening   Cybex Leg Press BLE: 5 plates x 12 reps; 7 plates x 10 reps       Manual Therapy   Soft tissue mobilization STM to Lt hamstring/ calf , deep work through [employer] glute/ hip rotators.  - to decrease fascial restrictions and improve mobility.           Reviewed appropriate pool exercises for LE with visual demonstration (on land) with pool noodle.        PT Short Term Goals - 02/18/20 1028      PT SHORT TERM GOAL #1   Title I with initial HEP    Time 4    Period Weeks    Status Achieved     Target Date 02/27/20      PT SHORT TERM GOAL #2   Title report hip pain decrease =/> 25-50% allowing patient to walk without pain for short distances    Time 4    Period Weeks    Status Achieved    Target Date 02/27/20      PT SHORT TERM GOAL #3   Title Increase Lt knee flexion to 100 deg flexion assessed in prone    Baseline -    Time 4    Status Achieved      PT SHORT TERM GOAL #4   Title patient reports improved ability to roll and turn in bed    Time 4    Period Weeks    Status Achieved    Target Date 02/27/20             PT Long Term Goals - 02/18/20 1058      PT LONG TERM GOAL #1   Title Decrease pain and tightness through the Lt lower quarter with palpation - increase tissue extensibility    Time 6    Period Weeks    Status Partially Met      PT LONG TERM GOAL #2   Title 5/5 strength Lt LE    Baseline -    Time 6    Period Weeks    Status On-going      PT LONG TERM GOAL #3   Title patient to report return to pain free ambulation for 15-20 of walking    Time 6    Period Weeks    Status Partially Met      PT LONG TERM GOAL #4   Title Independent in HEP    Time 6    Period Weeks    Status On-going      PT LONG TERM GOAL #5   Title Inprove FOTO to </= 35% limitation    Time 6    Period Weeks    Status On-going                 Plan - 02/27/20 1207    Clinical Impression Statement Slight flare up of Lt hip and knee pain after missing step entering pool 5 days ago.  Pt had already completed exercises (HEP) at home prior to arrival; focus was on manual therapy to loosen LLE to improve mobillity and pain level.  Pt reported elimination of pain by end of session.  Pt is near meeting remaining goals.    Rehab Potential Good    PT Frequency 2x / week    PT Duration 6 weeks    PT Treatment/Interventions Patient/family education;ADLs/Self Care Home Management;Cryotherapy;Electrical Stimulation;Iontophoresis 55m/ml Dexamethasone;Moist  Heat;Ultrasound;Gait training;Stair training;Therapeutic activities;Therapeutic exercise;Balance training;Neuromuscular re-education;Manual techniques;Dry needling;Taping    PT Next Visit Plan assess strength and goals. d/c vs hold.    PT Home Exercise Plan [id number]    Consulted and Agree with Plan of Care Patient           Patient will benefit from skilled therapeutic intervention in order to improve the following deficits and impairments:  Decreased range of motion, Increased fascial restricitons, Pain, Impaired flexibility, Improper body mechanics, Decreased mobility, Decreased strength  Visit Diagnosis: Pain in left hip  Other symptoms and signs involving the musculoskeletal system  Weakness of proximal end of lower extremity  Other abnormalities of gait and mobility     Problem List Patient Active Problem List   Diagnosis Date Noted  . Seborrheic keratosis 11/23/2017  . Closed displaced fracture of fifth metatarsal bone of left foot 03/31/2017  . 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 Baker's cyst of knee, left 08/25/2016  . Benign paroxysmal positional vertigo 08/25/2016  . Back pain 12/16/2015  . Cervical strain 11/21/2014  . Right shoulder pain 05/15/2014  . DDD (degenerative disc disease), lumbar 02/17/2014  . Unspecified vitamin D deficiency 03/18/2013  . Trochanteric bursitis of right hip 02/29/2012  . INSOMNIA 04/13/2010  . HYPERLIPIDEMIA 09/17/2007  . CERVICAL POLYP 09/17/2007  . Attention deficit disorder 09/11/2007  . UNSPECIFIED VENOUS INSUFFICIENCY 08/29/2007   JKerin Perna PTA 02/27/20 12:09 PM  [address] [place] [address]Phone: [phone]  Fax:  [phone] Name: GGISELLA ALWINEMRN: [id number] of Birth: 2Jun 23, 1957

## 2020-02-28 ENCOUNTER — Ambulatory Visit (INDEPENDENT_AMBULATORY_CARE_PROVIDER_SITE_OTHER): Payer: BC Managed Care – PPO | Admitting: Physical Therapy

## 2020-02-28 DIAGNOSIS — R29898 Other symptoms and signs involving the musculoskeletal system: Secondary | ICD-10-CM | POA: Diagnosis not present

## 2020-02-28 DIAGNOSIS — M25552 Pain in left hip: Secondary | ICD-10-CM | POA: Diagnosis not present

## 2020-02-28 NOTE — Therapy (Addendum)
[facility] [place], [place], [address] Phone: [phone]   Fax:  [phone]  Physical Therapy Treatment  Patient Details  Name: Wanda Gonzales MRN: [id number] Date of Birth: Aug 13, 1956 Referring Provider (PT): Dr Gaynelle Arabian   Encounter Date: 02/28/2020   PT End of Session - 02/28/20 1321    Visit Number 8    Number of Visits 12    Date for PT Re-Evaluation 03/12/20    PT Start Time 1320    PT Stop Time 1358    PT Time Calculation (min) 38 min    Activity Tolerance Patient tolerated treatment well;No increased pain    Behavior During Therapy WFL for tasks assessed/performed           Past Medical History:  Diagnosis Date  . Abnormal Pap smear, atypical squamous cells of undetermined sign (ASC-US) 1998   treated with cryo, CIN I  . ADD (attention deficit disorder) 02-23-12   easily distracted.  . Arthritis 02-23-12   Rt. hip and lt. arm  . Kidney calculi 07/2015  . Kidney stone   . PONV (postoperative nausea and vomiting) 02-23-12   always has PONV  . Post-nasal drainage 02-23-12   frequent a problem,causes a cough and mucus buildup in throat  . Urethral polyp 1998   Dr. Hartley Barefoot  . Varicose veins   . Venous insufficiency    chronic-denies any problems    Past Surgical History:  Procedure Laterality Date  . blood clot removed Left 01/2012   blood clot removed from left hand   . BREAST BIOPSY Right   . BREAST SURGERY  02-23-12   Rt. needle biopsy, recent -negative  path showed fibroadenoma  . COLONOSCOPY  08/2003   repeat 5 yrs.  . COLONOSCOPY W/ BIOPSIES  12/2008   1 polyp recheck 5 yrs  . EXCISION/RELEASE BURSA HIP  02/29/12  . HAND SURGERY  02-23-12   "blood clot" excised palm left hand 01-18-12  . KNEE ARTHROSCOPY W/ MENISCAL REPAIR  10/2012  . KNEE ARTHROSCOPY WITH EXCISION BAKER'S CYST Left 10/2012   meniscal repair  . ORIF METATARSAL FRACTURE Left 04/07/2017   Left 5th Metatarsal  .  SHOULDER SURGERY Right 02/2015   Dr. Lennette Bihari Supple - [facility]  . 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   Subjective Assessment - 02/28/20 1321    Subjective Pt reports her Lt hip feels better today; wa .  Lt knee is sore today.  Pleased with pool exercises; verbalized readiness to hold therapy and continued working on HEP.    Pertinent History Lt THA ~ 21 yrs ago; Lt TKA; fx Lt foot with ORIF plate and screws; osteoarthritis; oseoporosis    Patient Stated Goals get rid of the Lt hip pain    Currently in Pain? Yes    Pain Score 7     Pain Location Knee    Pain Orientation Left    Pain Descriptors / Indicators Sore              [facility] PT Assessment - 02/28/20 0001      Assessment   Medical Diagnosis Lt hip trochanteric bursitis     Referring Provider (PT) Dr Gaynelle Arabian    Onset Date/Surgical Date 10/30/19    Hand Dominance Right    Next MD Visit PRN     Prior Therapy  here for hip pain and back pain       Observation/Other Assessments   Focus on Therapeutic Outcomes (FOTO)  31% limitation       Strength   Left Hip Flexion 4+/5    Left Hip Extension 4+/5   with pain in back   Left Hip ABduction 4+/5      Flexibility   Quadriceps 90 deg Lt knee            [facility] Adult PT Treatment/Exercise - 02/28/20 0001      Self-Care   Self-Care Other Self-Care Comments    Other Self-Care Comments  Pt educated on self application of Rock tape.  Pt returned demo with cues and verbalized understanding.       Lumbar Exercises: Stretches   Passive Hamstring Stretch Left;2 reps;30 seconds   sitting, straight back, hip hinge   Quad Stretch Left;3 reps;30 seconds   prone, pillow under knee.      Lumbar Exercises: Aerobic   Nustep L4: 6 min       Lumbar Exercises: Standing   Row Both;10 reps;Theraband    Theraband Level (Row) Level 2 (Red)    Other Standing Lumbar Exercises reviewed  aquatic exercises for core (using kickboard, foam dumbells or noodle) as well as appropriate LE exercises.  Pt verbalized understanding.     Other Standing Lumbar Exercises anti-rotation with red band x side stepping x 8 reps each side.      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                      PT Short Term Goals - 02/18/20 1028      PT SHORT TERM GOAL #1   Title I with initial HEP    Time 4    Period Weeks    Status Achieved    Target Date 02/27/20      PT SHORT TERM GOAL #2   Title report hip pain decrease =/> 25-50% allowing patient to walk without pain for short distances    Time 4    Period Weeks    Status Achieved    Target Date 02/27/20      PT SHORT TERM GOAL #3   Title Increase Lt knee flexion to 100 deg flexion assessed in prone    Baseline -    Time 4    Status Achieved      PT SHORT TERM GOAL #4   Title patient reports improved ability to roll and turn in bed    Time 4    Period Weeks    Status Achieved    Target Date 02/27/20             PT Long Term Goals - 02/28/20 1329      PT LONG TERM GOAL #1   Title Decrease pain and tightness through the Lt lower quarter with palpation - increase tissue extensibility    Time 6    Period Weeks    Status Partially Met      PT LONG TERM GOAL #2   Title 5/5 strength Lt LE    Baseline -    Time 6    Period Weeks  Status On-going      PT LONG TERM GOAL #3   Title patient to report return to pain free ambulation for 15-20 of walking    Time 6    Period Weeks    Status Achieved      PT LONG TERM GOAL #4   Title Independent in HEP    Time 6    Period Weeks    Status Achieved      PT LONG TERM GOAL #5   Title Inprove FOTO to </= 35%  limitation    Time 6    Period Weeks    Status Achieved                 Plan - 02/28/20 1952    Clinical Impression Statement Pt's Lt quad flexibility with prone quad stretch was initially 74 deg; able to increase to 90 deg by 3rd rep.  Increased knee pain likely due to tightness in quad.  Pt reported elimination of knee pain after stretches and ktape application. Pt has partially met her goals.  She is pleased with current level of function; requests to hold therapy.    Rehab Potential Good    PT Frequency 2x / week    PT Duration 6 weeks    PT Treatment/Interventions Patient/family education;ADLs/Self Care Home Management;Cryotherapy;Electrical Stimulation;Iontophoresis 27m/ml Dexamethasone;Moist Heat;Ultrasound;Gait training;Stair training;Therapeutic activities;Therapeutic exercise;Balance training;Neuromuscular re-education;Manual techniques;Dry needling;Taping    PT Next Visit Plan hold therapy until 03/29/20; if pt doesn't return prior to then; will d/c.    PT Home Exercise Plan [id number]    Consulted and Agree with Plan of Care Patient           Patient will benefit from skilled therapeutic intervention in order to improve the following deficits and impairments:  Decreased range of motion, Increased fascial restricitons, Pain, Impaired flexibility, Improper body mechanics, Decreased mobility, Decreased strength  Visit Diagnosis: Pain in left hip  Other symptoms and signs involving the musculoskeletal system  Weakness of proximal end of lower extremity     Problem List Patient Active Problem List   Diagnosis Date Noted  . Seborrheic keratosis 11/23/2017  . Closed displaced fracture of fifth metatarsal bone of left foot 03/31/2017  . 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 Baker's cyst of knee, left 08/25/2016  . Benign paroxysmal positional vertigo 08/25/2016  . Back pain 12/16/2015  . Cervical strain 11/21/2014  . Right shoulder pain 05/15/2014  .  DDD (degenerative disc disease), lumbar 02/17/2014  . Unspecified vitamin D deficiency 03/18/2013  . Trochanteric bursitis of right hip 02/29/2012  . INSOMNIA 04/13/2010  . HYPERLIPIDEMIA 09/17/2007  . CERVICAL POLYP 09/17/2007  . Attention deficit disorder 09/11/2007  . UNSPECIFIED VENOUS INSUFFICIENCY 08/29/2007   JKerin Perna PTA 02/28/20 7:55 PM  [place] [address]Phone: [phone]  Fax:  [phone] Name: Wanda LARSENMRN: [id number] of Birth: 2Jan 26, 1957 PHYSICAL THERAPY DISCHARGE SUMMARY  Visits from Start of Care: 8  Current functional level related to goals / functional outcomes: See last progress note for discharge status    Remaining deficits: Needs to continue with consistent exercise program increasing intensity as tolerated.   Education / Equipment: HEP  Plan: Patient agrees to discharge.  Patient goals were met. Patient is being discharged due to meeting the stated rehab goals.  ?????    Celyn P. HHelene KelpPT, MPH 04/02/20 3:32 PM

## 2020-03-18 ENCOUNTER — Other Ambulatory Visit: Payer: Self-pay

## 2020-03-18 ENCOUNTER — Encounter: Payer: Self-pay | Admitting: Family Medicine

## 2020-03-18 ENCOUNTER — Ambulatory Visit: Payer: BC Managed Care – PPO | Admitting: Family Medicine

## 2020-03-18 DIAGNOSIS — J3089 Other allergic rhinitis: Secondary | ICD-10-CM | POA: Diagnosis not present

## 2020-03-18 DIAGNOSIS — J310 Chronic rhinitis: Secondary | ICD-10-CM | POA: Insufficient documentation

## 2020-03-18 MED ORDER — PREDNISONE 20 MG PO TABS: 20.0000 mg | ORAL_TABLET | Freq: Two times a day (BID) | ORAL | 0 refills | Status: AC

## 2020-03-18 NOTE — Patient Instructions (Signed)
Very nice to meet you today! I think allergies may be contributing to a lot of the symptoms you are experiencing Start flonase daily.  Start short course of prednisone 20mg  twice per day for 5 days.   If you feel like symptoms are continuing to worsen let us know.

## 2020-03-18 NOTE — Progress Notes (Signed)
Wanda Gonzales - 64 y.o. female MRN [id number]  Date of birth: 08-11-1956  Subjective Chief Complaint  Patient presents with  . Sore Throat  . Otalgia    HPI Wanda Gonzales is a 64 y.o. female here today with complaint of sore scratchy throat, ear pressure, congestion, watery eyes and post nasal drainage.  Symptoms started a few days ago.  She denies fever, chills, headache, sinus pain, cough, shortness of breath or drainage from the ear.  She has had problems with allergies in the past.  She is not currently using any medications for allergies or cold symptoms.    ROS:  A comprehensive ROS was completed and negative except as noted per HPI  Allergies  Allergen Reactions  . Codeine Other (See Comments)    REACTION: nausea and dizzy  . Doxycycline Nausea Only  . Concerta [Methylphenidate] Hives and Rash  . Sulfa Antibiotics Nausea Only    Past Medical History:  Diagnosis Date  . Abnormal Pap smear, atypical squamous cells of undetermined sign (ASC-US) 1998   treated with cryo, CIN I  . ADD (attention deficit disorder) 02-23-12   easily distracted.  . Arthritis 02-23-12   Rt. hip and lt. arm  . Kidney calculi 07/2015  . Kidney stone   . PONV (postoperative nausea and vomiting) 02-23-12   always has PONV  . Post-nasal drainage 02-23-12   frequent a problem,causes a cough and mucus buildup in throat  . Urethral polyp 1998   Dr. Hartley Barefoot  . Varicose veins   . Venous insufficiency    chronic-denies any problems    Past Surgical History:  Procedure Laterality Date  . blood clot removed Left 01/2012   blood clot removed from left hand   . BREAST BIOPSY Right   . BREAST SURGERY  02-23-12   Rt. needle biopsy, recent -negative  path showed fibroadenoma  . COLONOSCOPY  08/2003   repeat 5 yrs.  . COLONOSCOPY W/ BIOPSIES  12/2008   1 polyp recheck 5 yrs  . EXCISION/RELEASE BURSA HIP  02/29/12  . HAND SURGERY  02-23-12   "blood clot" excised palm left hand 01-18-12  . KNEE ARTHROSCOPY W/  MENISCAL REPAIR  10/2012  . KNEE ARTHROSCOPY WITH EXCISION BAKER'S CYST Left 10/2012   meniscal repair  . ORIF METATARSAL FRACTURE Left 04/07/2017   Left 5th Metatarsal  . SHOULDER SURGERY Right 02/2015   Dr. Lennette Bihari Supple - [facility]  . 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 Marital status: Married    Spouse name: Not on file  . Number of children: Not on file  . Years of education: Not on file  . Highest education level: Not on file  Occupational History  . Not on file  Tobacco Use  . Smoking status: Never Smoker  . Smokeless tobacco: Never Used  Vaping Use  . Vaping Use: Never used  Substance and Sexual Activity  . Alcohol use: Yes    Alcohol/week: 1.0 standard drink    Types: 1 Glasses of wine per week    Comment: rare occ.  . Drug use: No  . Sexual activity: Not Currently    Birth control/protection: Post-menopausal  Other Topics Concern  . 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   Social Determinants of Health   Financial Resource Strain:   .  Difficulty of Paying Living Expenses:   Food Insecurity:   . Worried About Charity fundraiser in the Last Year:   . Arboriculturist in the Last Year:   Transportation Needs:   . Film/video editor (Medical):   Marland Kitchen Lack of Transportation (Non-Medical):   Physical Activity:   . Days of Exercise per Week:   . Minutes of Exercise per Session:   Stress:   . Feeling of Stress :   Social Connections:   . Frequency of Communication with Friends and Family:   . Frequency of Social Gatherings with Friends and Family:   . Attends Religious Services:   . Active Member of Clubs or Organizations:   . Attends Archivist Meetings:   Marland Kitchen Marital Status:     Family History  Problem Relation Age of Onset  . Breast cancer Mother 64       breast  .  Hyperlipidemia Mother   . Brain cancer Father 61       brain  . Osteoporosis Maternal Grandmother   . Drug abuse Brother     Health Maintenance  Topic Date Due  . INFLUENZA VACCINE  04/19/2020  . MAMMOGRAM  06/25/2021  . PAP SMEAR-Modifier  08/30/2023  . COLONOSCOPY  12/25/2024  . TETANUS/TDAP  09/15/2029  . Hepatitis C Screening  Completed  . HIV Screening  Completed     ----------------------------------------------------------------------------------------------------------------------------------------------------------------------------------------------------------------- Physical Exam BP 106/62 (BP Location: Left Arm, Patient Position: Sitting, Cuff Size: Normal)   Pulse 85   Temp 98.1 F (36.7 C)   Ht 5' 1.02" (1.55 m)   Wt 135 lb 12.8 oz (61.6 kg)   LMP 12/18/2013   SpO2 97%   BMI 25.64 kg/m   Physical Exam Constitutional:      Appearance: She is well-developed.  HENT:     Head: Normocephalic and atraumatic.     Right Ear: Tympanic membrane normal.     Left Ear: Tympanic membrane normal.     Mouth/Throat:     Mouth: Mucous membranes are moist.     Pharynx: Posterior oropharyngeal erythema (mild posterior OP erythema) present. No oropharyngeal exudate.  Eyes:     General: No scleral icterus. Cardiovascular:     Rate and Rhythm: Normal rate and regular rhythm.  Pulmonary:     Effort: Pulmonary effort is normal.     Breath sounds: Normal breath sounds.  Musculoskeletal:     Cervical back: Neck supple.  Neurological:     General: No focal deficit present.     Mental Status: She is alert.  Psychiatric:        Mood and Affect: Mood normal.        Behavior: Behavior normal.     ------------------------------------------------------------------------------------------------------------------------------------------------------------------------------------------------------------------- Assessment and Plan  Rhinitis Current symptoms consistent with  viral vs. allergic etiology, possibly a combination of both.  Recommend continued supportive care at home with increased fluids, warm salt water gargles, and rest.  Recommend addition of flonase daily and will add short prednisone burst as well. She will let us know if symptoms fail to improve or she develops new or worsening symptoms.    Meds ordered this encounter  Medications  . predniSONE (DELTASONE) 20 MG tablet    Sig: Take 1 tablet (20 mg total) by mouth 2 (two) times daily with a meal for 5 days.    Dispense:  10 tablet    Refill:  0    No follow-ups on file.    This  visit occurred during the SARS-CoV-2 public health emergency.  Safety protocols were in place, including screening questions prior to the visit, additional usage of staff PPE, and extensive cleaning of exam room while observing appropriate contact time as indicated for disinfecting solutions.

## 2020-03-18 NOTE — Assessment & Plan Note (Signed)
Current symptoms consistent with viral vs. allergic etiology, possibly a combination of both.  Recommend continued supportive care at home with increased fluids, warm salt water gargles, and rest.  Recommend addition of flonase daily and will add short prednisone burst as well. She will let us know if symptoms fail to improve or she develops new or worsening symptoms.

## 2020-04-10 ENCOUNTER — Ambulatory Visit (INDEPENDENT_AMBULATORY_CARE_PROVIDER_SITE_OTHER): Payer: BC Managed Care – PPO | Admitting: Family Medicine

## 2020-04-10 ENCOUNTER — Encounter: Payer: Self-pay | Admitting: Family Medicine

## 2020-04-10 DIAGNOSIS — F418 Other specified anxiety disorders: Secondary | ICD-10-CM | POA: Diagnosis not present

## 2020-04-10 MED ORDER — ESCITALOPRAM OXALATE 10 MG PO TABS: 5.0000 mg | ORAL_TABLET | Freq: Every day | ORAL | 0 refills | Status: DC

## 2020-04-10 NOTE — Assessment & Plan Note (Signed)
PHQ-9 score of 14 and reports thoughts of being better off dead.  GAD-7 score of 12 today.  Discussed options.  I definitely try to get her in with a therapist here locally.  In addition we discussed medication as an option.  We will start Lexapro discussed potential side effects.  Follow-up in 3 weeks and will make any adjustments needed at that time.  Start with a half a tab daily for the first 2 weeks and then increase to a whole tab.

## 2020-04-10 NOTE — Progress Notes (Signed)
She reports that for the past few weeks she has felt depressed and anxious.   She has been dealing with body issues and she has been speaking with a body coach weekly and she feels that this has helped but wanted to f/u with pcp about talking to someone who is local
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
## 2020-05-01 ENCOUNTER — Ambulatory Visit: Payer: BC Managed Care – PPO | Admitting: Family Medicine

## 2020-05-03 ENCOUNTER — Other Ambulatory Visit: Payer: Self-pay | Admitting: Family Medicine

## 2020-05-14 ENCOUNTER — Ambulatory Visit: Payer: BC Managed Care – PPO | Admitting: Family Medicine

## 2020-05-27 ENCOUNTER — Encounter: Payer: Self-pay | Admitting: Family Medicine

## 2020-05-27 ENCOUNTER — Ambulatory Visit (INDEPENDENT_AMBULATORY_CARE_PROVIDER_SITE_OTHER): Payer: BC Managed Care – PPO | Admitting: Family Medicine

## 2020-05-27 VITALS — BP 111/62 | HR 73 | Ht 61.0 in | Wt 137.0 lb

## 2020-05-27 DIAGNOSIS — Z23 Encounter for immunization: Secondary | ICD-10-CM

## 2020-05-27 DIAGNOSIS — F418 Other specified anxiety disorders: Secondary | ICD-10-CM | POA: Diagnosis not present

## 2020-05-27 DIAGNOSIS — H6982 Other specified disorders of Eustachian tube, left ear: Secondary | ICD-10-CM

## 2020-05-27 NOTE — Assessment & Plan Note (Signed)
PHQ-9 score of 2 today and GAD-7 score of 4.  She is doing really fantastic now also milligrams of Lexapro.  Discussed continuing with working with her therapist and doing group sessions which I think have actually been really helpful for her.  Plan to see her back in about 2 to 3 months to make sure that she is still on track.

## 2020-05-27 NOTE — Progress Notes (Addendum)
Established Patient Office Visit  Subjective:  Patient ID: Wanda Gonzales, female    DOB: [birth year]  Age: 64 y.o. MRN: [id number]  CC:  No chief complaint on file.   HPI Wanda Gonzales presents for anxiety/depression.  This is a 2-week follow-up.  Last PHQ-9 score was 14 started Lexapro. She is up to whole tab daily. She  Is still working with therapist and bodycoach.  She says pretty quickly after starting the medication she started to notice improvement in her symptoms and felt like a fog had really lifted from her she still working with her coach and doing group sessions weekly and says it actually has been really helpful getting to me other people who are dealing with some of her same struggles.  She says the first day that she started the medication she did have some diarrhea and felt really nauseated she said she did not have any more diarrhea after that but the nausea persisted, on and off but it has just about completely resolved at that point.  She feels like she is sleeping well and has had not had any more thoughts of not wanting to be here which is great.  Also complains of some left ear pressure and pain.  She says sometimes it radiates towards her eye and into her sinuses but would like me to take a look at it today no ear drainage and no cold symptoms.  Past Medical History:  Diagnosis Date  . Abnormal Pap smear, atypical squamous cells of undetermined sign (ASC-US) 1998   treated with cryo, CIN I  . ADD (attention deficit disorder) 02-23-12   easily distracted.  . Arthritis 02-23-12   Rt. hip and lt. arm  . Kidney calculi 07/2015  . Kidney stone   . PONV (postoperative nausea and vomiting) 02-23-12   always has PONV  . Post-nasal drainage 02-23-12   frequent a problem,causes a cough and mucus buildup in throat  . Urethral polyp 1998   Dr. Hartley Barefoot  . Varicose veins   . Venous insufficiency    chronic-denies any problems    Past Surgical History:  Procedure Laterality Date   . blood clot removed Left 01/2012   blood clot removed from left hand   . BREAST BIOPSY Right   . BREAST SURGERY  02-23-12   Rt. needle biopsy, recent -negative  path showed fibroadenoma  . COLONOSCOPY  08/2003   repeat 5 yrs.  . COLONOSCOPY W/ BIOPSIES  12/2008   1 polyp recheck 5 yrs  . EXCISION/RELEASE BURSA HIP  02/29/12  . HAND SURGERY  02-23-12   "blood clot" excised palm left hand 01-18-12  . KNEE ARTHROSCOPY W/ MENISCAL REPAIR  10/2012  . KNEE ARTHROSCOPY WITH EXCISION BAKER'S CYST Left 10/2012   meniscal repair  . ORIF METATARSAL FRACTURE Left 04/07/2017   Left 5th Metatarsal  . SHOULDER SURGERY Right 02/2015   Dr. Lennette Bihari Supple - [facility]  . 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 Breast cancer Mother 44       breast  . Hyperlipidemia Mother   . Brain cancer Father 22       brain  . Osteoporosis Maternal Grandmother   . Drug abuse Brother     Social History   Socioeconomic History  . Marital status: Married    Spouse name: Not on file  . Number  of children: Not on file  . Years of education: Not on file  . Highest education level: Not on file  Occupational History  . Not on file  Tobacco Use  . Smoking status: Never Smoker  . Smokeless tobacco: Never Used  Vaping Use  . Vaping Use: Never used  Substance and Sexual Activity  . Alcohol use: Yes    Alcohol/week: 1.0 standard drink    Types: 1 Glasses of wine per week    Comment: rare occ.  . Drug use: No  . Sexual activity: Not Currently    Birth control/protection: Post-menopausal  Other Topics Concern  . 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   Social Determinants of Health   Financial Resource Strain:   . Difficulty of Paying Living Expenses: Not on file  Food Insecurity:   . Worried About Charity fundraiser in the Last Year: Not on  file  . Ran Out of Food in the Last Year: Not on file  Transportation Needs:   . Lack of Transportation (Medical): Not on file  . Lack of Transportation (Non-Medical): Not on file  Physical Activity:   . Days of Exercise per Week: Not on file  . Minutes of Exercise per Session: Not on file  Stress:   . Feeling of Stress : Not on file  Social Connections:   . Frequency of Communication with Friends and Family: Not on file  . Frequency of Social Gatherings with Friends and Family: Not on file  . Attends Religious Services: Not on file  . Active Member of Clubs or Organizations: Not on file  . Attends Archivist Meetings: Not on file  . Marital Status: Not on file  Intimate Partner Violence:   . Fear of Current or Ex-Partner: Not on file  . Emotionally Abused: Not on file  . Physically Abused: Not on file  . Sexually Abused: Not on file    Outpatient Medications Prior to Visit  Medication Sig Dispense Refill  . acetaminophen (TYLENOL) 325 MG tablet Take 650 mg by mouth as needed.    Marland Kitchen escitalopram (LEXAPRO) 10 MG tablet Take 1 tablet (10 mg total) by mouth daily. 90 tablet 0  . Multiple Vitamin (MULTIVITAMIN) tablet Take 1 tablet by mouth daily.     . TURMERIC PO Take by mouth.    Marland Kitchen VITAMIN D PO Take by mouth.     No facility-administered medications prior to visit.    Allergies  Allergen Reactions  . Codeine Other (See Comments)    REACTION: nausea and dizzy  . Doxycycline Nausea Only  . Concerta [Methylphenidate] Hives and Rash  . Sulfa Antibiotics Nausea Only    ROS Review of Systems    Objective:    Physical Exam Constitutional:      Appearance: She is well-developed.  HENT:     Head: Normocephalic and atraumatic.     Right Ear: Tympanic membrane, ear canal and external ear normal.     Left Ear: Tympanic membrane, ear canal and external ear normal.     Nose: Nose normal.  Eyes:     Conjunctiva/sclera: Conjunctivae normal.     Pupils: Pupils are  equal, round, and reactive to light.  Neck:     Thyroid: No thyromegaly.  Cardiovascular:     Rate and Rhythm: Normal rate and regular rhythm.     Heart sounds: Normal heart sounds.  Pulmonary:  Effort: Pulmonary effort is normal.     Breath sounds: Normal breath sounds. No wheezing.  Musculoskeletal:     Cervical back: Neck supple.  Lymphadenopathy:     Cervical: No cervical adenopathy.  Skin:    General: Skin is warm and dry.  Neurological:     Mental Status: She is alert and oriented to person, place, and time.  Psychiatric:        Behavior: Behavior normal.     BP 111/62   Pulse 73   Ht 5\' 1"  (1.549 m)   Wt 137 lb (62.1 kg)   LMP 12/18/2013   SpO2 98%   BMI 25.89 kg/m  Wt Readings from Last 3 Encounters:  05/27/20 137 lb (62.1 kg)  04/10/20 136 lb (61.7 kg)  03/18/20 135 lb 12.8 oz (61.6 kg)     There are no preventive care reminders to display for this patient.  There are no preventive care reminders to display for this patient.  Lab Results  Component Value Date   TSH 1.27 06/14/2018   Lab Results  Component Value Date   WBC 5.3 09/16/2019   HGB 13.7 09/16/2019   HCT 41.6 09/16/2019   MCV 92 09/16/2019   PLT 261 09/16/2019   Lab Results  Component Value Date   NA 142 09/16/2019   K 4.2 09/16/2019   CO2 25 09/16/2019   GLUCOSE 82 09/16/2019   BUN 18 09/16/2019   CREATININE 0.80 09/16/2019   BILITOT 0.6 09/16/2019   ALKPHOS 89 09/16/2019   AST 25 09/16/2019   ALT 19 09/16/2019   PROT 6.8 09/16/2019   ALBUMIN 4.4 09/16/2019   CALCIUM 9.8 09/16/2019   Lab Results  Component Value Date   CHOL 229 (H) 09/16/2019   Lab Results  Component Value Date   HDL 59 09/16/2019   Lab Results  Component Value Date   LDLCALC 146 (H) 09/16/2019   Lab Results  Component Value Date   TRIG 133 09/16/2019   Lab Results  Component Value Date   CHOLHDL 3.9 09/16/2019   Lab Results  Component Value Date   HGBA1C 4.9 12/19/2006       Assessment & Plan:   Problem List Items Addressed This Visit      Other   Anxious depression - Primary    PHQ-9 score of 2 today and GAD-7 score of 4.  She is doing really fantastic now also milligrams of Lexapro.  Discussed continuing with working with her therapist and doing group sessions which I think have actually been really helpful for her.  Plan to see her back in about 2 to 3 months to make sure that she is still on track.       Other Visit Diagnoses    Need for immunization against influenza       Relevant Orders   Flu Vaccine QUAD 36+ mos IM (Completed)   Dysfunction of left eustachian tube          Left eustachian tube dysfunction-recommend a trial of Flonase or Nasonex.  She has been using some over-the-counter sinus oral medication.  Call if gets worse or develops new symptoms.  No orders of the defined types were placed in this encounter.   Follow-up: Return in about 2 months (around 08/08/2020) for Follow up medication/mood .   Time spent 25 minutes in encounter.  Beatrice Lecher, MD

## 2020-07-27 DIAGNOSIS — M810 Age-related osteoporosis without current pathological fracture: Secondary | ICD-10-CM | POA: Insufficient documentation

## 2020-07-28 ENCOUNTER — Other Ambulatory Visit: Payer: Self-pay | Admitting: Family Medicine

## 2020-07-29 ENCOUNTER — Encounter: Payer: Self-pay | Admitting: Family Medicine

## 2020-07-29 ENCOUNTER — Ambulatory Visit (INDEPENDENT_AMBULATORY_CARE_PROVIDER_SITE_OTHER): Payer: BC Managed Care – PPO | Admitting: Family Medicine

## 2020-07-29 ENCOUNTER — Other Ambulatory Visit: Payer: Self-pay | Admitting: Family Medicine

## 2020-07-29 VITALS — BP 84/62 | HR 73 | Ht 61.0 in | Wt 137.0 lb

## 2020-07-29 DIAGNOSIS — M810 Age-related osteoporosis without current pathological fracture: Secondary | ICD-10-CM | POA: Diagnosis not present

## 2020-07-29 DIAGNOSIS — E785 Hyperlipidemia, unspecified: Secondary | ICD-10-CM

## 2020-07-29 DIAGNOSIS — M549 Dorsalgia, unspecified: Secondary | ICD-10-CM

## 2020-07-29 DIAGNOSIS — F418 Other specified anxiety disorders: Secondary | ICD-10-CM

## 2020-07-29 DIAGNOSIS — G8929 Other chronic pain: Secondary | ICD-10-CM

## 2020-07-29 DIAGNOSIS — Z1231 Encounter for screening mammogram for malignant neoplasm of breast: Secondary | ICD-10-CM

## 2020-07-29 DIAGNOSIS — E559 Vitamin D deficiency, unspecified: Secondary | ICD-10-CM

## 2020-07-29 NOTE — Assessment & Plan Note (Signed)
Recently started alendronate and doing well.  There can repeat bone density in about a year.  Her last one was in October 2020.  Also following up on what looks like more of a chronic vertebral fracture.

## 2020-07-29 NOTE — Progress Notes (Signed)
Established Patient Office Visit  Subjective:  Patient ID: Wanda Gonzales, female    DOB: October 30, 1955  Age: 64 y.o. MRN: [id number]  CC:  Chief Complaint  Patient presents with  . mood    HPI [name] presents for   F/U Mood medication.  She still working with her Chief Operating Officer and doing some group therapy for body image.  She says recently she was seen by the endocrinologist for osteoporosis and actually saw her weight on her checkout sheet and it was very triggering for her.  But she is tolerating the Lexapro well without any side effects or problems.  Her mom has been ill recently she is in her 41s and says that she may have been doing a little bit more stress eating than usual.  She was diagnosed with an old spinal fracture and has been working with Dr. Amalia Hailey, endocrinology at [facility].  She was placed on alendronate 70 mg daily.  Last bone density is October 2020.  She also has had persistent mid low back pain for years directly over the spine she is not sure if that is where the fracture is located or not.  She is never done formal physical therapy for this.  Recently she has been trying to swim for exercise.  Past Medical History:  Diagnosis Date  . Abnormal Pap smear, atypical squamous cells of undetermined sign (ASC-US) 1998   treated with cryo, CIN I  . ADD (attention deficit disorder) 02-23-12   easily distracted.  . Arthritis 02-23-12   Rt. hip and lt. arm  . Kidney calculi 07/2015  . Kidney stone   . PONV (postoperative nausea and vomiting) 02-23-12   always has PONV  . Post-nasal drainage 02-23-12   frequent a problem,causes a cough and mucus buildup in throat  . Urethral polyp 1998   Dr. Hartley Barefoot  . Varicose veins   . Venous insufficiency    chronic-denies any problems    Past Surgical History:  Procedure Laterality Date  . blood clot removed Left 01/2012   blood clot removed from left hand   . BREAST BIOPSY Right   . BREAST SURGERY  02-23-12   Rt. needle  biopsy, recent -negative  path showed fibroadenoma  . COLONOSCOPY  08/2003   repeat 5 yrs.  . COLONOSCOPY W/ BIOPSIES  12/2008   1 polyp recheck 5 yrs  . EXCISION/RELEASE BURSA HIP  02/29/12  . HAND SURGERY  02-23-12   "blood clot" excised palm left hand 01-18-12  . KNEE ARTHROSCOPY W/ MENISCAL REPAIR  10/2012  . KNEE ARTHROSCOPY WITH EXCISION BAKER'S CYST Left 10/2012   meniscal repair  . ORIF METATARSAL FRACTURE Left 04/07/2017   Left 5th Metatarsal  . SHOULDER SURGERY Right 02/2015   Dr. Lennette Bihari [facility] - [facility]  . 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 Breast cancer Mother 18       breast  . Hyperlipidemia Mother   . Brain cancer Father 48       brain  . Osteoporosis Maternal Grandmother   . Drug abuse Brother     Social History   Socioeconomic History  . Marital status: Married    Spouse name: Not on file  . Number of children: Not on file  . Years of education: Not on file  . Highest education level: Not on file  Occupational History  . Not on file  Tobacco Use  . Smoking status: Never Smoker  . Smokeless tobacco: Never Used  Vaping Use  . Vaping Use: Never used  Substance and Sexual Activity  . Alcohol use: Yes    Alcohol/week: 1.0 standard drink    Types: 1 Glasses of wine per week    Comment: rare occ.  . Drug use: No  . Sexual activity: Not Currently    Birth control/protection: Post-menopausal  Other Topics Concern  . 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   Social Determinants of Health   Financial Resource Strain:   . Difficulty of Paying Living Expenses: Not on file  Food Insecurity:   . Worried About Charity fundraiser in the Last Year: Not on file  . Ran Out of Food in the Last Year: Not on file  Transportation Needs:   . Lack of Transportation (Medical): Not on file  . Lack  of Transportation (Non-Medical): Not on file  Physical Activity:   . Days of Exercise per Week: Not on file  . Minutes of Exercise per Session: Not on file  Stress:   . Feeling of Stress : Not on file  Social Connections:   . Frequency of Communication with Friends and Family: Not on file  . Frequency of Social Gatherings with Friends and Family: Not on file  . Attends Religious Services: Not on file  . Active Member of Clubs or Organizations: Not on file  . Attends Archivist Meetings: Not on file  . Marital Status: Not on file  Intimate Partner Violence:   . Fear of Current or Ex-Partner: Not on file  . Emotionally Abused: Not on file  . Physically Abused: Not on file  . Sexually Abused: Not on file    Outpatient Medications Prior to Visit  Medication Sig Dispense Refill  . alendronate (FOSAMAX) 70 MG tablet Take by mouth.    Marland Kitchen acetaminophen (TYLENOL) 325 MG tablet Take 650 mg by mouth as needed.    Marland Kitchen escitalopram (LEXAPRO) 10 MG tablet TAKE 1 TABLET BY MOUTH EVERY DAY 90 tablet 0  . Multiple Vitamin (MULTIVITAMIN) tablet Take 1 tablet by mouth daily.     . TURMERIC PO Take by mouth.    Marland Kitchen VITAMIN D PO Take by mouth.     No facility-administered medications prior to visit.    Allergies  Allergen Reactions  . Codeine Other (See Comments)    REACTION: nausea and dizzy  . Doxycycline Nausea Only  . Concerta [Methylphenidate] Hives and Rash  . Sulfa Antibiotics Nausea Only    ROS Review of Systems    Objective:    Physical Exam Constitutional:      Appearance: She is well-developed.  HENT:     Head: Normocephalic and atraumatic.  Cardiovascular:     Rate and Rhythm: Normal rate and regular rhythm.     Heart sounds: Normal heart sounds.  Pulmonary:     Effort: Pulmonary effort is normal.     Breath sounds: Normal breath sounds.  Skin:    General: Skin is warm and dry.  Neurological:     Mental Status: She is alert and oriented to person, place, and  time.  Psychiatric:        Behavior: Behavior normal.     BP (!) 84/62   Pulse 73   Ht 5\' 1"  (1.549 m)   Wt  137 lb (62.1 kg)   LMP 12/18/2013   SpO2 96%   BMI 25.89 kg/m  Wt Readings from Last 3 Encounters:  07/29/20 137 lb (62.1 kg)  05/27/20 137 lb (62.1 kg)  04/10/20 136 lb (61.7 kg)     There are no preventive care reminders to display for this patient.  There are no preventive care reminders to display for this patient.  Lab Results  Component Value Date   TSH 1.27 06/14/2018   Lab Results  Component Value Date   WBC 5.3 09/16/2019   HGB 13.7 09/16/2019   HCT 41.6 09/16/2019   MCV 92 09/16/2019   PLT 261 09/16/2019   Lab Results  Component Value Date   NA 142 09/16/2019   K 4.2 09/16/2019   CO2 25 09/16/2019   GLUCOSE 82 09/16/2019   BUN 18 09/16/2019   CREATININE 0.80 09/16/2019   BILITOT 0.6 09/16/2019   ALKPHOS 89 09/16/2019   AST 25 09/16/2019   ALT 19 09/16/2019   PROT 6.8 09/16/2019   ALBUMIN 4.4 09/16/2019   CALCIUM 9.8 09/16/2019   Lab Results  Component Value Date   CHOL 229 (H) 09/16/2019   Lab Results  Component Value Date   HDL 59 09/16/2019   Lab Results  Component Value Date   LDLCALC 146 (H) 09/16/2019   Lab Results  Component Value Date   TRIG 133 09/16/2019   Lab Results  Component Value Date   CHOLHDL 3.9 09/16/2019   Lab Results  Component Value Date   HGBA1C 4.9 12/19/2006      Assessment & Plan:   Problem List Items Addressed This Visit      Musculoskeletal and Integument   Age-related osteoporosis without current pathological fracture    Recently started alendronate and doing well.  There can repeat bone density in about a year.  Her last one was in October 2020.  Also following up on what looks like more of a chronic vertebral fracture.      Relevant Medications   alendronate (FOSAMAX) 70 MG tablet     Other   Vitamin D deficiency - Primary    Due for yearly labs.  Plan to recheck vitamin D.       Relevant Orders   COMPLETE METABOLIC PANEL WITH GFR   Lipid panel   CBC   VITAMIN D 25 Hydroxy (Vit-D Deficiency, Fractures)   Mid back pain, chronic    Chronic  mid back pain.  She has been seen for sports medicine for this before.  We can always get her back in with sports medicine or Ortho.  She would likely benefit from formal physical therapy she has started swimming recently which I think would be helpful.      Hyperlipidemia    Due to recheck lipids.      Relevant Orders   COMPLETE METABOLIC PANEL WITH GFR   Lipid panel   CBC   VITAMIN D 25 Hydroxy (Vit-D Deficiency, Fractures)      No orders of the defined types were placed in this encounter.   Follow-up: Return in about 4 months (around 11/26/2020) for Mood .    Beatrice Lecher, MD

## 2020-07-29 NOTE — Assessment & Plan Note (Signed)
Due for yearly labs.  Plan to recheck vitamin D.

## 2020-07-29 NOTE — Assessment & Plan Note (Signed)
Chronic  mid back pain.  She has been seen for sports medicine for this before.  We can always get her back in with sports medicine or Ortho.  She would likely benefit from formal physical therapy she has started swimming recently which I think would be helpful.

## 2020-07-29 NOTE — Assessment & Plan Note (Signed)
Due to recheck lipids. 

## 2020-07-29 NOTE — Assessment & Plan Note (Signed)
Overall stable on Lexapro continuing with her body coach and group therapy sessions which have been helpful.  She is also trying to exercise more regularly which is great.

## 2020-08-07 ENCOUNTER — Ambulatory Visit (INDEPENDENT_AMBULATORY_CARE_PROVIDER_SITE_OTHER): Payer: BC Managed Care – PPO | Admitting: Sports Medicine

## 2020-08-07 ENCOUNTER — Other Ambulatory Visit: Payer: Self-pay

## 2020-08-07 DIAGNOSIS — M51369 Other intervertebral disc degeneration, lumbar region without mention of lumbar back pain or lower extremity pain: Secondary | ICD-10-CM

## 2020-08-07 DIAGNOSIS — M810 Age-related osteoporosis without current pathological fracture: Secondary | ICD-10-CM | POA: Diagnosis not present

## 2020-08-07 DIAGNOSIS — M5136 Other intervertebral disc degeneration, lumbar region: Secondary | ICD-10-CM | POA: Diagnosis not present

## 2020-08-07 MED ORDER — MELOXICAM 15 MG PO TABS: ORAL_TABLET | ORAL | 3 refills | Status: DC

## 2020-08-07 MED ORDER — PREDNISONE 50 MG PO TABS: ORAL_TABLET | ORAL | 0 refills | Status: DC

## 2020-08-07 MED ORDER — GABAPENTIN 300 MG PO CAPS: 300.0000 mg | ORAL_CAPSULE | Freq: Every day | ORAL | 3 refills | Status: DC

## 2020-08-07 NOTE — Assessment & Plan Note (Signed)
Wanda Gonzales does have osteoporosis, currently on Fosamax, this is appropriate. She did have a T11 vertebral compression fracture that was also seen on x-rays from 2018 so not new.

## 2020-08-07 NOTE — Assessment & Plan Note (Addendum)
This is a pleasant 64 year old female, she has multilevel DDD with stable L2 and L3 retrolisthesis. She does have an old T10 compression fracture that was seen recently as well as on an x-ray from 2018. Pain is predominantly axial and discogenic, occasional right-sided L3 distribution radicular symptoms. We discussed the anatomy and anthropology as well as evolution of the lumbar spine, proceeding with physical therapy, 5 days of prednisone, meloxicam, Neurontin at bedtime. Return to see me in 6 weeks, MRI for interventional planning if no better.  Of note she does have a trip coming up to [place] at the end of January 2022 that we need her feeling good by.

## 2020-08-07 NOTE — Progress Notes (Signed)
    Procedures performed today:    None.  Independent interpretation of notes and tests performed by another provider:   None.  Brief History, Exam, Impression, and Recommendations:    DDD (degenerative disc disease), lumbar This is a pleasant 64 year old female, she has multilevel DDD with stable L2 and L3 retrolisthesis. She does have an old T10 compression fracture that was seen recently as well as on an x-ray from 2018. Pain is predominantly axial and discogenic, occasional right-sided L3 distribution radicular symptoms. We discussed the anatomy and anthropology as well as evolution of the lumbar spine, proceeding with physical therapy, 5 days of prednisone, meloxicam, Neurontin at bedtime. Return to see me in 6 weeks, MRI for interventional planning if no better.  Of note she does have a trip coming up to [place] at the end of January 2022 that we need her feeling good by.  Age-related osteoporosis without current pathological fracture Baker Janus does have osteoporosis, currently on Fosamax, this is appropriate. She did have a T11 vertebral compression fracture that was also seen on x-rays from 2018 so not new.    ___________________________________________ Gwen Her. Dianah Field, M.D., ABFM., CAQSM. Primary Care and [place] Instructor of [facility]

## 2020-08-08 LAB — COMPLETE METABOLIC PANEL WITH GFR
AG Ratio: 1.7 (calc) (ref 1.0–2.5)
ALT: 12 U/L (ref 6–29)
AST: 20 U/L (ref 10–35)
Albumin: 4.3 g/dL (ref 3.6–5.1)
Alkaline phosphatase (APISO): 76 U/L (ref 37–153)
BUN: 20 mg/dL (ref 7–25)
CO2: 29 mmol/L (ref 20–32)
Calcium: 9.3 mg/dL (ref 8.6–10.4)
Chloride: 107 mmol/L (ref 98–110)
Creat: 0.88 mg/dL (ref 0.50–0.99)
GFR, Est African American: 81 mL/min/{1.73_m2} (ref >=60)
GFR, Est Non African American: 70 mL/min/{1.73_m2} (ref >=60)
Globulin: 2.5 g/dL (calc) (ref 1.9–3.7)
Glucose, Bld: 84 mg/dL (ref 65–99)
Potassium: 4.3 mmol/L (ref 3.5–5.3)
Sodium: 142 mmol/L (ref 135–146)
Total Bilirubin: 0.6 mg/dL (ref 0.2–1.2)
Total Protein: 6.8 g/dL (ref 6.1–8.1)

## 2020-08-08 LAB — CBC
HCT: 41.7 % (ref 35.0–45.0)
Hemoglobin: 13.9 g/dL (ref 11.7–15.5)
MCH: 30.2 pg (ref 27.0–33.0)
MCHC: 33.3 g/dL (ref 32.0–36.0)
MCV: 90.7 fL (ref 80.0–100.0)
MPV: 10.2 fL (ref 7.5–12.5)
Platelets: 260 10*3/uL (ref 140–400)
RBC: 4.6 10*6/uL (ref 3.80–5.10)
RDW: 12.2 % (ref 11.0–15.0)
WBC: 4.3 10*3/uL (ref 3.8–10.8)

## 2020-08-08 LAB — LIPID PANEL
Cholesterol: 233 mg/dL — ABNORMAL HIGH (ref <200)
HDL: 58 mg/dL (ref >=50)
LDL Cholesterol (Calc): 156 mg/dL (calc) — ABNORMAL HIGH
Non-HDL Cholesterol (Calc): 175 mg/dL (calc) — ABNORMAL HIGH (ref <130)
Total CHOL/HDL Ratio: 4 (calc) (ref <5.0)
Triglycerides: 86 mg/dL (ref <150)

## 2020-08-08 LAB — VITAMIN D 25 HYDROXY (VIT D DEFICIENCY, FRACTURES): Vit D, 25-Hydroxy: 60 ng/mL (ref 30–100)

## 2020-08-19 ENCOUNTER — Encounter: Payer: Self-pay | Admitting: Physical Therapy

## 2020-08-19 ENCOUNTER — Other Ambulatory Visit: Payer: Self-pay

## 2020-08-19 ENCOUNTER — Ambulatory Visit (INDEPENDENT_AMBULATORY_CARE_PROVIDER_SITE_OTHER): Payer: BC Managed Care – PPO | Admitting: Physical Therapy

## 2020-08-19 DIAGNOSIS — M2170 Unequal limb length (acquired), unspecified site: Secondary | ICD-10-CM | POA: Diagnosis not present

## 2020-08-19 DIAGNOSIS — M545 Low back pain, unspecified: Secondary | ICD-10-CM

## 2020-08-19 DIAGNOSIS — M6281 Muscle weakness (generalized): Secondary | ICD-10-CM | POA: Diagnosis not present

## 2020-08-19 DIAGNOSIS — R296 Repeated falls: Secondary | ICD-10-CM

## 2020-08-19 DIAGNOSIS — G8929 Other chronic pain: Secondary | ICD-10-CM

## 2020-08-19 NOTE — Therapy (Addendum)
[place] [place], [place], [address] Phone: [phone]   Fax:  [phone]  Physical Therapy Evaluation  Patient Details  Name: Wanda Gonzales MRN: [id number] Date of Birth: 1956/08/27 Referring Provider (PT): Dr. Darene Lamer    Encounter Date: 08/19/2020   PT End of Session - 08/19/20 1614    Visit Number 1    Number of Visits 16    Date for PT Re-Evaluation 10/14/20    Authorization Type [employer] PPO    Authorization Time Period 08/19/20 to 10/14/2020    Authorization - Visit Number 1    Authorization - Number of Visits 9    PT Start Time 2458    PT Stop Time 1428    PT Time Calculation (min) 40 min    Activity Tolerance Patient tolerated treatment well    Behavior During Therapy [facility] for tasks assessed/performed           Past Medical History:  Diagnosis Date  . Abnormal Pap smear, atypical squamous cells of undetermined sign (ASC-US) 1998   treated with cryo, CIN I  . ADD (attention deficit disorder) 02-23-12   easily distracted.  . Arthritis 02-23-12   Rt. hip and lt. arm  . Kidney calculi 07/2015  . Kidney stone   . PONV (postoperative nausea and vomiting) 02-23-12   always has PONV  . Post-nasal drainage 02-23-12   frequent a problem,causes a cough and mucus buildup in throat  . Urethral polyp 1998   Dr. Hartley Barefoot  . Varicose veins   . Venous insufficiency    chronic-denies any problems    Past Surgical History:  Procedure Laterality Date  . blood clot removed Left 01/2012   blood clot removed from left hand   . BREAST BIOPSY Right   . BREAST SURGERY  02-23-12   Rt. needle biopsy, recent -negative  path showed fibroadenoma  . COLONOSCOPY  08/2003   repeat 5 yrs.  . COLONOSCOPY W/ BIOPSIES  12/2008   1 polyp recheck 5 yrs  . EXCISION/RELEASE BURSA HIP  02/29/12  . HAND SURGERY  02-23-12   "blood clot" excised palm left hand 01-18-12  . KNEE ARTHROSCOPY W/ MENISCAL REPAIR  10/2012  . KNEE ARTHROSCOPY WITH  EXCISION BAKER'S CYST Left 10/2012   meniscal repair  . ORIF METATARSAL FRACTURE Left 04/07/2017   Left 5th Metatarsal  . SHOULDER SURGERY Right 02/2015   Dr. Lennette Bihari Supple - [facility]  . 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    Subjective Assessment - 08/19/20 1352    Subjective I'm here for my back- I have a compression fracture that is not new but recently started hurting. 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 When i exercise I can't run or jump per my ortho doc's instructions so I have been [place] a [place]    How long can you sit comfortably? not as much a problem    How long can you stand comfortably? standing in one position- immediate discomfort    How long can you walk comfortably? if my back has been hurting walking can flare it up; if its a good day  and I don't hurt before, walking does not hurt    Patient Stated Goals be pain free for [place] trip at end of January 2022    Currently in Pain? No/denies                          Objective measurements completed on examination: See above findings.               PT Education - 08/19/20 1613    Education Details exam findings, prognosis, HEP and reasoning, POC    Person(s) Educated Patient    Methods Explanation;Demonstration;Handout    Comprehension Verbalized understanding;Returned demonstration            PT Short Term Goals - 08/19/20 1618      PT SHORT TERM GOAL #1   Title Patient to report pain as being no more than 5/10 at worst    Time 4    Period Weeks    Status New    Target Date 09/16/20      PT SHORT TERM GOAL #2   Title Patient to show improved mechanics in terms of lifting/squatting/bending with cues only  50% of the time in order to show improved awareness of good ergonomics    Time 4    Period Weeks    Status New      PT SHORT TERM GOAL #3   Title Patient to be able to maintain SLS for at least 15 seconds on L LE in order to reduce fall risk and show improved overall strength    Time 4    Period Weeks    Status New      PT SHORT TERM GOAL #4   Title Patient to be independent in HEP, to be updated weekly    Time 4    Period Weeks    Status New             PT Long Term Goals - 08/19/20 1620      PT LONG TERM GOAL #1   Title Patient to show MMT as having improved by at least 1 grade in order to show improved functional strength    Time 8    Period Weeks    Status New    Target Date 10/14/20      PT LONG TERM GOAL #2   Title Patient to report pain as being no more than 2/10 in order to improve QOL and facilitate trip to [place] next year    Time 8    Period Weeks    Status New      PT LONG TERM GOAL #3   Title Patient to score at least 20/24 on DGI in order to show reduced fall risk    Time 8    Period Weeks    Status New      PT LONG TERM GOAL #4   Title Patient to demonstrate ability to maintain good body mechanics with simulated work and home care tasks with cues no more than 25% of the time    Time 8    Period Weeks    Status New                  Plan - 08/19/20 1615    Clinical Impression Statement Ms. Boys arrives reporting ongoing back pain and chronic compression fracture. Of note, lumbar extensions do significantly improve her pain. Otherwise found significant gross muscle weakness, as well as significant  length length discrepancy, impaired functional biomechanics, and significant impairment in dynamic balance skills/report of frequent falls. Will benefit from skilled PT f/u to address above issues with modified schedule due to financial concerns. Educated on importance of consistency with HEP given reduced number of in -person sessions. Provided with  appropriate HEP and heel lift to begin addressing deficits.    Personal Factors and Comorbidities Fitness;Comorbidity 3+;Time since onset of injury/illness/exacerbation;Past/Current Experience    Comorbidities osteoporosis, throchanteric bursitis, HLD, ADD, TKR, THR    Examination-Activity Limitations Locomotion Level;Transfers;Bed Mobility;Reach Overhead;Bend;Sit;Squat;Stand;Stairs;Lift    Examination-Participation Restrictions Occupation;Cleaning;Community Activity;Laundry;Yard Work    Merchant navy officer Evolving/Moderate complexity    Clinical Decision Making Moderate    Rehab Potential Good    PT Frequency 2x / week   combo of in clinic and aquatics   PT Duration 8 weeks    PT Treatment/Interventions ADLs/Self Care Home Management;Aquatic Therapy;Cryotherapy;Electrical Stimulation;Iontophoresis 4mg /ml Dexamethasone;Moist Heat;Ultrasound;DME Instruction;Gait training;Stair training;Functional mobility training;Therapeutic activities;Therapeutic exercise;Balance training;Neuromuscular re-education;Patient/family education;Orthotic Fit/Training;Manual techniques;Passive range of motion;Dry needling;Taping;Vasopneumatic Device;Joint Manipulations    PT Next Visit Plan review HEP; f/u on how heel lifts are working out. Focus on lumbar extension exercises, general strengthening and balance work    PT [place] and Agree with Plan of Care Patient           Patient will benefit from skilled therapeutic intervention in order to improve the following deficits and impairments:  Abnormal gait, Difficulty walking, Decreased endurance, Increased muscle spasms, Decreased activity tolerance, Pain, Decreased balance, Impaired flexibility, Improper body mechanics, Decreased mobility, Decreased strength, Postural dysfunction  Visit Diagnosis: Chronic bilateral low back pain without sciatica - Plan: PT plan of care cert/re-cert  Muscle weakness (generalized) -  Plan: PT plan of care cert/re-cert  Leg length discrepancy - Plan: PT plan of care cert/re-cert  Repeated falls - Plan: PT plan of care cert/re-cert     Problem List Patient Active Problem List   Diagnosis Date Noted  . Age-related osteoporosis without current pathological fracture 07/29/2020  . 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 Rhinitis 03/18/2020  . Seborrheic keratosis 11/23/2017  . Closed displaced fracture of fifth metatarsal bone of left foot 03/31/2017  . 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 Benign paroxysmal positional vertigo 08/25/2016  . Mid back pain, chronic 12/16/2015  . Cervical strain 11/21/2014  . Right shoulder pain 05/15/2014  . DDD (degenerative disc disease), lumbar 02/17/2014  . Vitamin D deficiency 03/18/2013  . Trochanteric bursitis of right hip 02/29/2012  . INSOMNIA 04/13/2010  . Hyperlipidemia 09/17/2007  . CERVICAL POLYP 09/17/2007  . Attention deficit disorder 09/11/2007  . UNSPECIFIED VENOUS INSUFFICIENCY 08/29/2007    Ann Lions PT, DPT, PN1   Supplemental Physical Therapist [facility]    Pager [phone] Acute Rehab Office [facility] [address] [place], [place], [address] Phone: [phone]   Fax:  [phone]  Name: TALASIA SAULTER MRN: [id number] Date of Birth: January 07, 1956

## 2020-08-21 ENCOUNTER — Ambulatory Visit (INDEPENDENT_AMBULATORY_CARE_PROVIDER_SITE_OTHER): Payer: BC Managed Care – PPO | Admitting: Physical Therapy

## 2020-08-21 ENCOUNTER — Other Ambulatory Visit: Payer: Self-pay

## 2020-08-21 DIAGNOSIS — M2170 Unequal limb length (acquired), unspecified site: Secondary | ICD-10-CM

## 2020-08-21 DIAGNOSIS — M6281 Muscle weakness (generalized): Secondary | ICD-10-CM | POA: Diagnosis not present

## 2020-08-21 DIAGNOSIS — M545 Low back pain, unspecified: Secondary | ICD-10-CM | POA: Diagnosis not present

## 2020-08-21 DIAGNOSIS — R296 Repeated falls: Secondary | ICD-10-CM | POA: Diagnosis not present

## 2020-08-21 DIAGNOSIS — G8929 Other chronic pain: Secondary | ICD-10-CM

## 2020-08-21 NOTE — Therapy (Signed)
[facility] [place], [place], [address] Phone: [phone]   Fax:  [phone]  Physical Therapy Treatment  Patient Details  Name: Wanda Gonzales MRN: [id number] Date of Birth: 1956-06-08 Referring Provider (PT): Dr. Darene Lamer    Encounter Date: 08/21/2020   PT End of Session - 08/21/20 1818    Visit Number 2    Number of Visits 16    Date for PT Re-Evaluation 10/14/20    Authorization Type BCBS PPO    Authorization Time Period 08/19/20 to 10/14/2020    Authorization - Visit Number 2    Authorization - Number of Visits 9    PT Start Time 8099    PT Stop Time 1525    PT Time Calculation (min) 40 min    Activity Tolerance Patient tolerated treatment well    Behavior During Therapy [facility] for tasks assessed/performed           Past Medical History:  Diagnosis Date  . Abnormal Pap smear, atypical squamous cells of undetermined sign (ASC-US) 1998   treated with cryo, CIN I  . ADD (attention deficit disorder) 02-23-12   easily distracted.  . Arthritis 02-23-12   Rt. hip and lt. arm  . Kidney calculi 07/2015  . Kidney stone   . PONV (postoperative nausea and vomiting) 02-23-12   always has PONV  . Post-nasal drainage 02-23-12   frequent a problem,causes a cough and mucus buildup in throat  . Urethral polyp 1998   Dr. Hartley Barefoot  . Varicose veins   . Venous insufficiency    chronic-denies any problems    Past Surgical History:  Procedure Laterality Date  . blood clot removed Left 01/2012   blood clot removed from left hand   . BREAST BIOPSY Right   . BREAST SURGERY  02-23-12   Rt. needle biopsy, recent -negative  path showed fibroadenoma  . COLONOSCOPY  08/2003   repeat 5 yrs.  . COLONOSCOPY W/ BIOPSIES  12/2008   1 polyp recheck 5 yrs  . EXCISION/RELEASE BURSA HIP  02/29/12  . HAND SURGERY  02-23-12   "blood clot" excised palm left hand 01-18-12  . KNEE ARTHROSCOPY W/ MENISCAL REPAIR  10/2012  . KNEE ARTHROSCOPY WITH  EXCISION BAKER'S CYST Left 10/2012   meniscal repair  . ORIF METATARSAL FRACTURE Left 04/07/2017   Left 5th Metatarsal  . SHOULDER SURGERY Right 02/2015   Dr. Lennette Bihari Supple - [facility]  . 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   Subjective Assessment - 08/21/20 1819    Subjective Pt reports she has not tried the heel lifts.  She requests to have her leg length measured again at upcoming visit.  She has been working out remotely with a Chief Operating Officer and inquires if she should be performing burpees, even if modified.  Currently back doesn't hurt, but it hurt yesterday when she was raking the lawn.    How long can you sit comfortably? not as much a problem    How long can you stand comfortably? standing in one position- immediate discomfort    How long can you walk comfortably? if my back has been hurting walking can flare it up; if its a good day and I don't hurt before, walking does not hurt    Patient Stated Goals be pain free for [place] trip at end of January 2022  Currently in Pain? No/denies           Pt seen for aquatic therapy today.  Treatment took place in water 3-4 ft in depth at [facility]. Temp of water was 87 deg.  Pt entered/exited the pool independently via stairs with unilateral rail.  Treatment:   Backward gait with hands in water for extra resistance x 50 meters.  Side stepping Rt/Lt x 25 meters each direction, with cues to slow speed and for alignment.  Forward jogging x 20 meters. Standing hip ext and hip abdct with hands on pool edge for support x 10 reps each exercise, each leg.  Trial of standing TA engagement with pressing into noodle on surface of water x 3-5 sec x 5 reps.   Ai Chi postures x 10 reps of each, with legs in squat or split squat position:  Floating, Uplifting, enclosing, soothing, Freeing, Accepting, and Balancing (challenging due to decreased  balance in SLS)   Core engagement with rowing, holding buoyancy dumbbells in each hand, slightly submerging in water x 20 reps; Soothing Ai Chi posture with dummbells.   Seated on bench holding dumbbell vertical and attempting to submerge it with core engaged and straight back, slowly releasing to surface x 10 reps.    Reviewed exercises to avoid with trainer, instead focusing on extension based strengthening.   Pt requires buoyancy for support and to offload joints with strengthening exercises. Viscosity of the water is needed for resistance of strengthening; water current perturbations provides challenge to standing balance unsupported, requiring increased core activation.      PT Short Term Goals - 08/19/20 1618      PT SHORT TERM GOAL #1   Title Patient to report pain as being no more than 5/10 at worst    Time 4    Period Weeks    Status New    Target Date 09/16/20      PT SHORT TERM GOAL #2   Title Patient to show improved mechanics in terms of lifting/squatting/bending with cues only 50% of the time in order to show improved awareness of good ergonomics    Time 4    Period Weeks    Status New      PT SHORT TERM GOAL #3   Title Patient to be able to maintain SLS for at least 15 seconds on L LE in order to reduce fall risk and show improved overall strength    Time 4    Period Weeks    Status New      PT SHORT TERM GOAL #4   Title Patient to be independent in HEP, to be updated weekly    Time 4    Period Weeks    Status New             PT Long Term Goals - 08/19/20 1620      PT LONG TERM GOAL #1   Title Patient to show MMT as having improved by at least 1 grade in order to show improved functional strength    Time 8    Period Weeks    Status New    Target Date 10/14/20      PT LONG TERM GOAL #2   Title Patient to report pain as being no more than 2/10 in order to improve QOL and facilitate trip to [facility] next year    Time 8    Period Weeks    Status New  PT LONG TERM GOAL #3   Title Patient to score at least 20/24 on DGI in order to show reduced fall risk    Time 8    Period Weeks    Status New      PT LONG TERM GOAL #4   Title Patient to demonstrate ability to maintain good body mechanics with simulated work and home care tasks with cues no more than 25% of the time    Time 8    Period Weeks    Status New                 Plan - 08/21/20 1821    Clinical Impression Statement Pt tolerated all aquatic exercises well, without production of pain.  She needed occasional cues to slow speed of exercise, to engage core, to utilize hands in water for increased resistance, and for more upright posture during exercises.  Encouraged pt to focus on extension based exercises.  Plan to review current exercises she performs with trainer/ body coach and modify if needed.  Goals are ongoing.    Personal Factors and Comorbidities Fitness;Comorbidity 3+;Time since onset of injury/illness/exacerbation;Past/Current Experience    Comorbidities osteoporosis, throchanteric bursitis, HLD, ADD, TKR, THR    Examination-Activity Limitations Locomotion Level;Transfers;Bed Mobility;Reach Overhead;Bend;Sit;Squat;Stand;Stairs;Lift    Examination-Participation Restrictions Occupation;Cleaning;Community Activity;Laundry;Yard Work    Merchant navy officer Evolving/Moderate complexity    Rehab Potential Good    PT Frequency 2x / week   combo of in clinic and aquatics   PT Duration 8 weeks    PT Treatment/Interventions ADLs/Self Care Home Management;Aquatic Therapy;Cryotherapy;Electrical Stimulation;Iontophoresis 4mg /ml Dexamethasone;Moist Heat;Ultrasound;DME Instruction;Gait training;Stair training;Functional mobility training;Therapeutic activities;Therapeutic exercise;Balance training;Neuromuscular re-education;Patient/family education;Orthotic Fit/Training;Manual techniques;Passive range of motion;Dry needling;Taping;Vasopneumatic Device;Joint  Manipulations    PT Next Visit Plan review HEP; f/u on how heel lifts are working out. Focus on lumbar extension exercises, general strengthening and balance work    PT [place] and Agree with Plan of Care Patient           Patient will benefit from skilled therapeutic intervention in order to improve the following deficits and impairments:  Abnormal gait, Difficulty walking, Decreased endurance, Increased muscle spasms, Decreased activity tolerance, Pain, Decreased balance, Impaired flexibility, Improper body mechanics, Decreased mobility, Decreased strength, Postural dysfunction  Visit Diagnosis: Chronic bilateral low back pain without sciatica  Muscle weakness (generalized)  Leg length discrepancy  Repeated falls     Problem List Patient Active Problem List   Diagnosis Date Noted  . Age-related osteoporosis without current pathological fracture 07/29/2020  . 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 Rhinitis 03/18/2020  . Seborrheic keratosis 11/23/2017  . Closed displaced fracture of fifth metatarsal bone of left foot 03/31/2017  . 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 Benign paroxysmal positional vertigo 08/25/2016  . Mid back pain, chronic 12/16/2015  . Cervical strain 11/21/2014  . Right shoulder pain 05/15/2014  . DDD (degenerative disc disease), lumbar 02/17/2014  . Vitamin D deficiency 03/18/2013  . Trochanteric bursitis of right hip 02/29/2012  . INSOMNIA 04/13/2010  . Hyperlipidemia 09/17/2007  . CERVICAL POLYP 09/17/2007  . Attention deficit disorder 09/11/2007  . UNSPECIFIED VENOUS INSUFFICIENCY 08/29/2007   Kerin Perna, PTA 08/21/20 6:50 PM  [facility] [place], [place], [address] Phone: [phone]   Fax:  [phone]  Name: Wanda Gonzales MRN: [id number] Date of Birth: 1956-03-07

## 2020-08-27 ENCOUNTER — Other Ambulatory Visit: Payer: Self-pay

## 2020-08-27 ENCOUNTER — Ambulatory Visit (INDEPENDENT_AMBULATORY_CARE_PROVIDER_SITE_OTHER): Payer: BC Managed Care – PPO | Admitting: Physical Therapy

## 2020-08-27 DIAGNOSIS — M2170 Unequal limb length (acquired), unspecified site: Secondary | ICD-10-CM | POA: Diagnosis not present

## 2020-08-27 DIAGNOSIS — M6281 Muscle weakness (generalized): Secondary | ICD-10-CM

## 2020-08-27 DIAGNOSIS — G8929 Other chronic pain: Secondary | ICD-10-CM

## 2020-08-27 DIAGNOSIS — M545 Low back pain, unspecified: Secondary | ICD-10-CM

## 2020-08-27 DIAGNOSIS — R296 Repeated falls: Secondary | ICD-10-CM | POA: Diagnosis not present

## 2020-08-27 NOTE — Therapy (Signed)
[facility] [place], [place], [address] Phone: [phone]   Fax:  [phone]  Physical Therapy Treatment  Patient Details  Name: Wanda Gonzales MRN: [id number] Date of Birth: [birth year] Referring Provider (PT): Dr. Sharyn Dross   Encounter Date: 08/27/2020   PT End of Session - 08/27/20 1408    Visit Number 3    Number of Visits 16    Date for PT Re-Evaluation 10/14/20    Authorization Type BCBS PPO    Authorization Time Period 08/19/20 to 10/14/2020    Authorization - Visit Number 3    Authorization - Number of Visits 9    PT Start Time 6962    PT Stop Time 1447    PT Time Calculation (min) 42 min    Activity Tolerance Patient tolerated treatment well    Behavior During Therapy [facility] for tasks assessed/performed           Past Medical History:  Diagnosis Date  . Abnormal Pap smear, atypical squamous cells of undetermined sign (ASC-US) 1998   treated with cryo, CIN I  . ADD (attention deficit disorder) 02-23-12   easily distracted.  . Arthritis 02-23-12   Rt. hip and lt. arm  . Kidney calculi 07/2015  . Kidney stone   . PONV (postoperative nausea and vomiting) 02-23-12   always has PONV  . Post-nasal drainage 02-23-12   frequent a problem,causes a cough and mucus buildup in throat  . Urethral polyp 1998   Dr. Hartley Barefoot  . Varicose veins   . Venous insufficiency    chronic-denies any problems    Past Surgical History:  Procedure Laterality Date  . blood clot removed Left 01/2012   blood clot removed from left hand   . BREAST BIOPSY Right   . BREAST SURGERY  02-23-12   Rt. needle biopsy, recent -negative  path showed fibroadenoma  . COLONOSCOPY  08/2003   repeat 5 yrs.  . COLONOSCOPY W/ BIOPSIES  12/2008   1 polyp recheck 5 yrs  . EXCISION/RELEASE BURSA HIP  02/29/12  . HAND SURGERY  02-23-12   "blood clot" excised palm left hand 01-18-12  . KNEE ARTHROSCOPY W/ MENISCAL REPAIR  10/2012  . KNEE ARTHROSCOPY  WITH EXCISION BAKER'S CYST Left 10/2012   meniscal repair  . ORIF METATARSAL FRACTURE Left 04/07/2017   Left 5th Metatarsal  . SHOULDER SURGERY Right 02/2015   Dr. Lennette Bihari Supple - [facility]  . 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   Subjective Assessment - 08/27/20 1409    Subjective Pt reports she woke up with some increased pain yesterday; exercise resolved pain.  She walked yesterday with dog, and it went well.  She has worn the heel lift in her Lt shoe over last few days; doesn't like it. She feels like she is limping with the heel lift in shoe. She burned her finger and so she hasn't been to pool in the past week.    How long can you sit comfortably? not as much a problem    Patient Stated Goals be pain free for [place] trip at end of January 2022    Currently in Pain? No/denies    Multiple Pain Sites No              [facility] PT Assessment - 08/27/20 0001      Assessment  Medical Diagnosis lumbar DDD    Referring Provider (PT) Dr. Sharyn Dross    Onset Date/Surgical Date --   chronic for years    Next MD Visit Dr. Darene Lamer at end of December     Prior Therapy multiple bouts of therapy for hip/back/knee             [facility] Adult PT Treatment/Exercise - 08/27/20 0001      Lumbar Exercises: Stretches   Passive Hamstring Stretch Right;Left;2 reps;20 seconds   seated, straight back with hip hinge.   Single Knee to Chest Stretch Right;Left;2 reps;10 seconds    Lower Trunk Rotation 2 reps;10 seconds   gentle, arms in Y.     Lumbar Exercises: Aerobic   Nustep L5: arms/legs x 5 min for warm up.      Lumbar Exercises: Standing   Row Strengthening;Both;10 reps    Theraband Level (Row) Level 3 (Green)   2 sets   Shoulder Extension Both;10 reps    Theraband Level (Shoulder Extension) Level 2 (Red)   2 sets   Other Standing Lumbar Exercises anti-rotation side step with band in/out from core x 5  reps each side (green band), x 2 sets, back became sore at 2nd set    Other Standing Lumbar Exercises opp arm/leg lift with hands on cabinets x 5 reps, x 2 sets      Lumbar Exercises: Supine   Pelvic Tilt 3 reps;5 seconds    Single Leg Bridge 5 reps   fig 4 position     Lumbar Exercises: Prone   Opposite Arm/Leg Raise Right arm/Left leg;Left arm/Right leg;5 reps                  PT Education - 08/27/20 1515    Education Details HEP updated.    Person(s) Educated Patient    Methods Explanation;Demonstration;Handout;Verbal cues;Tactile cues    Comprehension Verbalized understanding;Returned demonstration            PT Short Term Goals - 08/19/20 1618      PT SHORT TERM GOAL #1   Title Patient to report pain as being no more than 5/10 at worst    Time 4    Period Weeks    Status New    Target Date 09/16/20      PT SHORT TERM GOAL #2   Title Patient to show improved mechanics in terms of lifting/squatting/bending with cues only 50% of the time in order to show improved awareness of good ergonomics    Time 4    Period Weeks    Status New      PT SHORT TERM GOAL #3   Title Patient to be able to maintain SLS for at least 15 seconds on L LE in order to reduce fall risk and show improved overall strength    Time 4    Period Weeks    Status New      PT SHORT TERM GOAL #4   Title Patient to be independent in HEP, to be updated weekly    Time 4    Period Weeks    Status New             PT Long Term Goals - 08/19/20 1620      PT LONG TERM GOAL #1   Title Patient to show MMT as having improved by at least 1 grade in order to show improved functional strength    Time 8    Period Weeks    Status  New    Target Date 10/14/20      PT LONG TERM GOAL #2   Title Patient to report pain as being no more than 2/10 in order to improve QOL and facilitate trip to [place] next year    Time 8    Period Weeks    Status New      PT LONG TERM GOAL #3   Title Patient to  score at least 20/24 on DGI in order to show reduced fall risk    Time 8    Period Weeks    Status New      PT LONG TERM GOAL #4   Title Patient to demonstrate ability to maintain good body mechanics with simulated work and home care tasks with cues no more than 25% of the time    Time 8    Period Weeks    Status New                 Plan - 08/27/20 1506    Clinical Impression Statement Pt shown some extension based exercises with core engagement.  She reported some increase in low back discomfort after completing 3rd set of 5 standing hip ext (with opp arm in air); given cues to dial back amount of hip ext she performs. Mild discomfort in Lt low back when completing anti-rotation exercise with green band; not added to HEP.  Occasional cues to slow speed of exercises and for form.  Negative response to heel lift in Lt shoe; pt prefers to walk without it as she noticed no difference in back pain and instead voiced concern over increased limp. Pt progressing towards LTGs.    Personal Factors and Comorbidities Fitness;Comorbidity 3+;Time since onset of injury/illness/exacerbation;Past/Current Experience    Comorbidities osteoporosis, throchanteric bursitis, HLD, ADD, TKR, THR    Examination-Activity Limitations Locomotion Level;Transfers;Bed Mobility;Reach Overhead;Bend;Sit;Squat;Stand;Stairs;Lift    Examination-Participation Restrictions Occupation;Cleaning;Community Activity;Laundry;Yard Work    Merchant navy officer Evolving/Moderate complexity    Rehab Potential Good    PT Frequency 2x / week   combo of in clinic and aquatics   PT Duration 8 weeks    PT Treatment/Interventions ADLs/Self Care Home Management;Aquatic Therapy;Cryotherapy;Electrical Stimulation;Iontophoresis 4mg /ml Dexamethasone;Moist Heat;Ultrasound;DME Instruction;Gait training;Stair training;Functional mobility training;Therapeutic activities;Therapeutic exercise;Balance training;Neuromuscular  re-education;Patient/family education;Orthotic Fit/Training;Manual techniques;Passive range of motion;Dry needling;Taping;Vasopneumatic Device;Joint Manipulations    PT Next Visit Plan Focus on lumbar extension exercises, general strengthening and balance work.  Next in-clinic visit - DGI and assess goals for upcoming MD appt.    PT Home Exercise Plan [email or web address]    Consulted and Agree with Plan of Care Patient           Patient will benefit from skilled therapeutic intervention in order to improve the following deficits and impairments:  Abnormal gait,Difficulty walking,Decreased endurance,Increased muscle spasms,Decreased activity tolerance,Pain,Decreased balance,Impaired flexibility,Improper body mechanics,Decreased mobility,Decreased strength,Postural dysfunction  Visit Diagnosis: Chronic bilateral low back pain without sciatica  Muscle weakness (generalized)  Leg length discrepancy  Repeated falls     Problem List Patient Active Problem List   Diagnosis Date Noted  . Age-related osteoporosis without current pathological fracture 07/29/2020  . 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 Rhinitis 03/18/2020  . Seborrheic keratosis 11/23/2017  . Closed displaced fracture of fifth metatarsal bone of left foot 03/31/2017  . 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 Benign paroxysmal positional vertigo 08/25/2016  . Mid back pain, chronic 12/16/2015  . Cervical strain 11/21/2014  . Right shoulder pain 05/15/2014  . DDD (degenerative disc disease), lumbar  02/17/2014  . Vitamin D deficiency 03/18/2013  . Trochanteric bursitis of right hip 02/29/2012  . INSOMNIA 04/13/2010  . Hyperlipidemia 09/17/2007  . CERVICAL POLYP 09/17/2007  . Attention deficit disorder 09/11/2007  . UNSPECIFIED VENOUS INSUFFICIENCY 08/29/2007   Kerin Perna, PTA 08/27/20 3:17 PM  [facility] [place], [place],  [address] Phone: [phone]   Fax:  [phone]  Name: Wanda Gonzales MRN: [id number] Date of Birth: 1956-02-17

## 2020-08-27 NOTE — Patient Instructions (Addendum)
Access Code: [email or web address] URL: [email or web address] Date: 12/09/2021Prepared by: [place]  Single Leg Stance - 2 x daily - 7 x weekly - 1 sets - 3 reps - 15-30 hold  Shoulder Extension with Resistance - 1 x daily - 3 x weekly - 3 sets - 10 reps  Split Stance Shoulder Row with Resistance - 1 x daily - 3 x weekly - 3 sets - 10 reps  Standing Hip Extension with Counter Support - 1 x daily - 3 x weekly - 5 sets - 5 reps  Supine Bridge - 1 x daily - 3 x weekly - 1 sets - 10 reps  Seated Hamstring Stretch - 1 x daily - 7 x weekly - 1 sets - 2 reps - 20 seconds hold

## 2020-08-28 ENCOUNTER — Ambulatory Visit: Payer: BC Managed Care – PPO | Admitting: Physical Therapy

## 2020-09-04 ENCOUNTER — Other Ambulatory Visit: Payer: Self-pay

## 2020-09-04 ENCOUNTER — Ambulatory Visit (INDEPENDENT_AMBULATORY_CARE_PROVIDER_SITE_OTHER): Payer: BC Managed Care – PPO | Admitting: Physical Therapy

## 2020-09-04 DIAGNOSIS — M2170 Unequal limb length (acquired), unspecified site: Secondary | ICD-10-CM

## 2020-09-04 DIAGNOSIS — R296 Repeated falls: Secondary | ICD-10-CM

## 2020-09-04 DIAGNOSIS — G8929 Other chronic pain: Secondary | ICD-10-CM

## 2020-09-04 DIAGNOSIS — M6281 Muscle weakness (generalized): Secondary | ICD-10-CM

## 2020-09-04 DIAGNOSIS — M545 Low back pain, unspecified: Secondary | ICD-10-CM

## 2020-09-04 NOTE — Therapy (Signed)
[employer] [place], [place], [address] Phone: [phone]   Fax:  [phone]  Physical Therapy Treatment  Patient Details  Name: Wanda Gonzales MRN: [id number] Date of Birth: 24-Nov-1955 Referring Provider (PT): Dr. Sharyn Dross   Encounter Date: 09/04/2020   PT End of Session - 09/04/20 1811    Visit Number 4    Number of Visits 16    Date for PT Re-Evaluation 10/14/20    Authorization Type BCBS PPO    Authorization Time Period 08/19/20 to 10/14/2020    Authorization - Visit Number 4    Authorization - Number of Visits 9    PT Start Time 1318    PT Stop Time 1400    PT Time Calculation (min) 42 min    Activity Tolerance Patient tolerated treatment well    Behavior During Therapy [facility] for tasks assessed/performed           Past Medical History:  Diagnosis Date  . Abnormal Pap smear, atypical squamous cells of undetermined sign (ASC-US) 1998   treated with cryo, CIN I  . ADD (attention deficit disorder) 02-23-12   easily distracted.  . Arthritis 02-23-12   Rt. hip and lt. arm  . Kidney calculi 07/2015  . Kidney stone   . PONV (postoperative nausea and vomiting) 02-23-12   always has PONV  . Post-nasal drainage 02-23-12   frequent a problem,causes a cough and mucus buildup in throat  . Urethral polyp 1998   Dr. Hartley Barefoot  . Varicose veins   . Venous insufficiency    chronic-denies any problems    Past Surgical History:  Procedure Laterality Date  . blood clot removed Left 01/2012   blood clot removed from left hand   . BREAST BIOPSY Right   . BREAST SURGERY  02-23-12   Rt. needle biopsy, recent -negative  path showed fibroadenoma  . COLONOSCOPY  08/2003   repeat 5 yrs.  . COLONOSCOPY W/ BIOPSIES  12/2008   1 polyp recheck 5 yrs  . EXCISION/RELEASE BURSA HIP  02/29/12  . HAND SURGERY  02-23-12   "blood clot" excised palm left hand 01-18-12  . KNEE ARTHROSCOPY W/ MENISCAL REPAIR  10/2012  . KNEE ARTHROSCOPY  WITH EXCISION BAKER'S CYST Left 10/2012   meniscal repair  . ORIF METATARSAL FRACTURE Left 04/07/2017   Left 5th Metatarsal  . SHOULDER SURGERY Right 02/2015   Dr. Lennette Bihari Supple - [facility]  . 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  Her finger has healed (from glue burn) so now she is ready to get back in the water.  She continues to work with Engineer, production, but has modified exercises with a lot of forward flexion (ie: doing squats instead of bear crawls)    Patient Stated Goals be pain free for [place] trip at end of January 2022    Currently in Pain? Yes    Pain Score 4     Pain Location Back    Pain Orientation Mid;Lower    Pain Descriptors / Indicators Sore    Aggravating Factors  bending forward    Pain Relieving Factors resting, not bending forward.           Pt  seen for aquatic therapy today.  Treatment took place in water 3-4 ft in depth at [facility]. Temp of water was 87 deg.  Pt entered the pool via ramp independently and exited the pool via stairs with single rail independently.  Treatment:  Forward walking 50 meters. Sidestepping 25 meters Rt/ Lt.  Backward walking x 50 meters.  Braiding x 20 meters each direction.  Row front/ side 10 each with floatation dumbells underwater.   Washing machine (floatation dumbbells moving across water with trunk rotation, legs in squat) 10;  Squats pushing dumbbell underwater 10;   SLS each leg with horizontal head turns x 30 sec; SLS with legs front, side, back x 5 each  Ai chi Freeing and balancing x 10 reps each leg forward.    Forward jogging/ backward jogging 20 meters  Forward lunges x 15 meters; Side squat Rt/ Lt 10 meters each. Heel toe raises no support x 10.    Pt requires  buoyancy for support and to offload joints with strengthening exercises. Viscosity of the water is needed for resistance of strengthening; water current perturbations provides challenge to standing balance unsupported, requiring increased core activation.     PT Short Term Goals - 08/19/20 1618      PT SHORT TERM GOAL #1   Title Patient to report pain as being no more than 5/10 at worst    Time 4    Period Weeks    Status New    Target Date 09/16/20      PT SHORT TERM GOAL #2   Title Patient to show improved mechanics in terms of lifting/squatting/bending with cues only 50% of the time in order to show improved awareness of good ergonomics    Time 4    Period Weeks    Status New      PT SHORT TERM GOAL #3   Title Patient to be able to maintain SLS for at least 15 seconds on L LE in order to reduce fall risk and show improved overall strength    Time 4    Period Weeks    Status New      PT SHORT TERM GOAL #4   Title Patient to be independent in HEP, to be updated weekly    Time 4    Period Weeks    Status New             PT Long Term Goals - 08/19/20 1620      PT LONG TERM GOAL #1   Title Patient to show MMT as having improved by at least 1 grade in order to show improved functional strength    Time 8    Period Weeks    Status New    Target Date 10/14/20      PT LONG TERM GOAL #2   Title Patient to report pain as being no more than 2/10 in order to improve QOL and facilitate trip to [place] next year    Time 8    Period Weeks    Status New      PT LONG TERM GOAL #3   Title Patient to score at least 20/24 on DGI in order to show reduced fall risk    Time 8    Period Weeks    Status New      PT LONG TERM GOAL #4   Title Patient to demonstrate ability to maintain good body mechanics with simulated work and home care tasks with cues no  more than 25% of the time    Time 8    Period Weeks    Status New                 Plan - 09/04/20 1812    Clinical  Impression Statement Pt required occasional cues for more upright posture and less forward flexion during standing exercises in pool. Pt reported significant reduction of LBP when performing aquatic exercises. Added exercises in single leg stance for balance.  Encouraged pt to trial these exercises next time she came to pool.  Progressing well towards LTGs.    Personal Factors and Comorbidities Fitness;Comorbidity 3+;Time since onset of injury/illness/exacerbation;Past/Current Experience    Comorbidities osteoporosis, throchanteric bursitis, HLD, ADD, TKR, THR    Examination-Activity Limitations Locomotion Level;Transfers;Bed Mobility;Reach Overhead;Bend;Sit;Squat;Stand;Stairs;Lift    Examination-Participation Restrictions Occupation;Cleaning;Community Activity;Laundry;Yard Work    Merchant navy officer Evolving/Moderate complexity    Rehab Potential Good    PT Frequency 2x / week   combo of in clinic and aquatics   PT Duration 8 weeks    PT Treatment/Interventions ADLs/Self Care Home Management;Aquatic Therapy;Cryotherapy;Electrical Stimulation;Iontophoresis 4mg /ml Dexamethasone;Moist Heat;Ultrasound;DME Instruction;Gait training;Stair training;Functional mobility training;Therapeutic activities;Therapeutic exercise;Balance training;Neuromuscular re-education;Patient/family education;Orthotic Fit/Training;Manual techniques;Passive range of motion;Dry needling;Taping;Vasopneumatic Device;Joint Manipulations    PT Next Visit Plan Focus on lumbar extension exercises, general strengthening and balance work.  Next in-clinic visit - DGI and assess goals for upcoming MD appt.    PT Home Exercise Plan [email or web address]    Consulted and Agree with Plan of Care Patient           Patient will benefit from skilled therapeutic intervention in order to improve the following deficits and impairments:  Abnormal gait,Difficulty walking,Decreased endurance,Increased muscle spasms,Decreased activity  tolerance,Pain,Decreased balance,Impaired flexibility,Improper body mechanics,Decreased mobility,Decreased strength,Postural dysfunction  Visit Diagnosis: Chronic bilateral low back pain without sciatica  Muscle weakness (generalized)  Leg length discrepancy  Repeated falls     Problem List Patient Active Problem List   Diagnosis Date Noted  . Age-related osteoporosis without current pathological fracture 07/29/2020  . 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 Rhinitis 03/18/2020  . Seborrheic keratosis 11/23/2017  . Closed displaced fracture of fifth metatarsal bone of left foot 03/31/2017  . 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 Benign paroxysmal positional vertigo 08/25/2016  . Mid back pain, chronic 12/16/2015  . Cervical strain 11/21/2014  . Right shoulder pain 05/15/2014  . DDD (degenerative disc disease), lumbar 02/17/2014  . Vitamin D deficiency 03/18/2013  . Trochanteric bursitis of right hip 02/29/2012  . INSOMNIA 04/13/2010  . Hyperlipidemia 09/17/2007  . CERVICAL POLYP 09/17/2007  . Attention deficit disorder 09/11/2007  . UNSPECIFIED VENOUS INSUFFICIENCY 08/29/2007   Kerin Perna, PTA 09/04/20 6:46 PM  [place], [place], [address] Phone: [phone]   Fax:  [phone]  Name: Wanda Gonzales MRN: [id number] Date of Birth: [birth year]

## 2020-09-08 ENCOUNTER — Encounter: Payer: Self-pay | Admitting: Physical Therapy

## 2020-09-08 ENCOUNTER — Ambulatory Visit: Payer: BC Managed Care – PPO | Admitting: Physical Therapy

## 2020-09-08 ENCOUNTER — Other Ambulatory Visit: Payer: Self-pay

## 2020-09-08 DIAGNOSIS — G8929 Other chronic pain: Secondary | ICD-10-CM

## 2020-09-08 DIAGNOSIS — M6281 Muscle weakness (generalized): Secondary | ICD-10-CM

## 2020-09-08 DIAGNOSIS — M545 Low back pain, unspecified: Secondary | ICD-10-CM

## 2020-09-08 DIAGNOSIS — M2170 Unequal limb length (acquired), unspecified site: Secondary | ICD-10-CM

## 2020-09-08 NOTE — Therapy (Signed)
[employer] [place], [place], [address] Phone: [phone]   Fax:  [phone]  Physical Therapy Treatment  Patient Details  Name: Wanda Gonzales MRN: [id number] Date of Birth: 12/11/1955 Referring Provider (PT): Dr. Sharyn Dross   Encounter Date: 09/08/2020   PT End of Session - 09/08/20 0846    Visit Number 5    Number of Visits 16    Date for PT Re-Evaluation 10/14/20    Authorization Type BCBS PPO    Authorization Time Period 08/19/20 to 10/14/2020    Authorization - Visit Number 5    Authorization - Number of Visits 9    PT Start Time [id number]    PT Stop Time 0938    PT Time Calculation (min) 51 min    Activity Tolerance Patient tolerated treatment well    Behavior During Therapy [facility] for tasks assessed/performed           Past Medical History:  Diagnosis Date  . Abnormal Pap smear, atypical squamous cells of undetermined sign (ASC-US) 1998   treated with cryo, CIN I  . ADD (attention deficit disorder) 02-23-12   easily distracted.  . Arthritis 02-23-12   Rt. hip and lt. arm  . Kidney calculi 07/2015  . Kidney stone   . PONV (postoperative nausea and vomiting) 02-23-12   always has PONV  . Post-nasal drainage 02-23-12   frequent a problem,causes a cough and mucus buildup in throat  . Urethral polyp 1998   Dr. Hartley Barefoot  . Varicose veins   . Venous insufficiency    chronic-denies any problems    Past Surgical History:  Procedure Laterality Date  . blood clot removed Left 01/2012   blood clot removed from left hand   . BREAST BIOPSY Right   . BREAST SURGERY  02-23-12   Rt. needle biopsy, recent -negative  path showed fibroadenoma  . COLONOSCOPY  08/2003   repeat 5 yrs.  . COLONOSCOPY W/ BIOPSIES  12/2008   1 polyp recheck 5 yrs  . EXCISION/RELEASE BURSA HIP  02/29/12  . HAND SURGERY  02-23-12   "blood clot" excised palm left hand 01-18-12  . KNEE ARTHROSCOPY W/ MENISCAL REPAIR  10/2012  . KNEE ARTHROSCOPY  WITH EXCISION BAKER'S CYST Left 10/2012   meniscal repair  . ORIF METATARSAL FRACTURE Left 04/07/2017   Left 5th Metatarsal  . SHOULDER SURGERY Right 02/2015   Dr. Lennette Bihari Supple - [facility]  . 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   Subjective Assessment - 09/08/20 0847    Subjective Wanda Gonzales reports that her back is hurting her today. Not sure why today is worse    Patient Stated Goals be pain free for [place] trip at end of January 2022    Currently in Pain? Yes    Pain Score 6     Pain Location Back    Pain Orientation Lower    Pain Descriptors / Indicators Sore;Aching    Pain Type Chronic pain    Pain Onset More than a month ago    Pain Frequency Intermittent    Aggravating Factors  bending forward                             [facility] Adult PT Treatment/Exercise - 09/08/20 0001      Exercises  Exercises Lumbar      Lumbar Exercises: Stretches   Active Hamstring Stretch Left;Right   10 reps   Single Knee to Chest Stretch Left;Right   10 reps   Figure 4 Stretch 30 seconds;2 reps   seated   Other Lumbar Stretch Exercise LE windshield wipers hooklying and seated      Lumbar Exercises: Aerobic   Nustep L5: legs x 5 min for warm up.      Lumbar Exercises: Prone   Other Prone Lumbar Exercises pelvic press series, 10 reps lower body, 5 reps upper body.      Modalities   Modalities Moist Heat      Moist Heat Therapy   Number Minutes Moist Heat 10 Minutes    Moist Heat Location Lumbar Spine;Hip   Lt                   PT Short Term Goals - 08/19/20 1618      PT SHORT TERM GOAL #1   Title Patient to report pain as being no more than 5/10 at worst    Time 4    Period Weeks    Status New    Target Date 09/16/20      PT SHORT TERM GOAL #2   Title Patient to show improved mechanics in terms of lifting/squatting/bending with cues only 50% of the time  in order to show improved awareness of good ergonomics    Time 4    Period Weeks    Status New      PT SHORT TERM GOAL #3   Title Patient to be able to maintain SLS for at least 15 seconds on L LE in order to reduce fall risk and show improved overall strength    Time 4    Period Weeks    Status New      PT SHORT TERM GOAL #4   Title Patient to be independent in HEP, to be updated weekly    Time 4    Period Weeks    Status New             PT Long Term Goals - 08/19/20 1620      PT LONG TERM GOAL #1   Title Patient to show MMT as having improved by at least 1 grade in order to show improved functional strength    Time 8    Period Weeks    Status New    Target Date 10/14/20      PT LONG TERM GOAL #2   Title Patient to report pain as being no more than 2/10 in order to improve QOL and facilitate trip to [place] next year    Time 8    Period Weeks    Status New      PT LONG TERM GOAL #3   Title Patient to score at least 20/24 on DGI in order to show reduced fall risk    Time 8    Period Weeks    Status New      PT LONG TERM GOAL #4   Title Patient to demonstrate ability to maintain good body mechanics with simulated work and home care tasks with cues no more than 25% of the time    Time 8    Period Weeks    Status New                 Plan - 09/08/20 0852    Clinical Impression Statement Shardea reports  increased pain in her lumbar region today,  She responded decreased tightness and discomfort at end of session.  Her Lt hip was a little sore after the stretches, this decreased with moist heat to the area.  Making slow improvements to her goals.  sShe was instructed in modified dead lifts to perform while her exercise class performs burpees.  She was able to demo with good form.    Rehab Potential Good    PT Frequency 2x / week    PT Duration 8 weeks    PT Treatment/Interventions ADLs/Self Care Home Management;Aquatic Therapy;Cryotherapy;Electrical  Stimulation;Iontophoresis 4mg /ml Dexamethasone;Moist Heat;Ultrasound;DME Instruction;Gait training;Stair training;Functional mobility training;Therapeutic activities;Therapeutic exercise;Balance training;Neuromuscular re-education;Patient/family education;Orthotic Fit/Training;Manual techniques;Passive range of motion;Dry needling;Taping;Vasopneumatic Device;Joint Manipulations    PT Next Visit Plan cont with deep core stabilization work    PT [place] and Agree with Plan of Care Patient           Patient will benefit from skilled therapeutic intervention in order to improve the following deficits and impairments:  Abnormal gait,Difficulty walking,Decreased endurance,Increased muscle spasms,Decreased activity tolerance,Pain,Decreased balance,Impaired flexibility,Improper body mechanics,Decreased mobility,Decreased strength,Postural dysfunction  Visit Diagnosis: Chronic bilateral low back pain without sciatica  Muscle weakness (generalized)  Leg length discrepancy     Problem List Patient Active Problem List   Diagnosis Date Noted  . Age-related osteoporosis without current pathological fracture 07/29/2020  . 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 Rhinitis 03/18/2020  . Seborrheic keratosis 11/23/2017  . Closed displaced fracture of fifth metatarsal bone of left foot 03/31/2017  . 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 Benign paroxysmal positional vertigo 08/25/2016  . Mid back pain, chronic 12/16/2015  . Cervical strain 11/21/2014  . Right shoulder pain 05/15/2014  . DDD (degenerative disc disease), lumbar 02/17/2014  . Vitamin D deficiency 03/18/2013  . Trochanteric bursitis of right hip 02/29/2012  . INSOMNIA 04/13/2010  . Hyperlipidemia 09/17/2007  . CERVICAL POLYP 09/17/2007  . Attention deficit disorder 09/11/2007  . UNSPECIFIED VENOUS INSUFFICIENCY 08/29/2007    Wanda Gonzales PT  09/08/2020, 9:32 AM  [facility] [address] [place], [place], [address] Phone: [phone]   Fax:  [phone]  Name: Wanda Gonzales MRN: [id number] Date of Birth: [birth year]

## 2020-09-08 NOTE — Patient Instructions (Signed)
Access Code: [email or web address] URL: [email or web address] Date: 09/08/2020 Prepared by: Jeral Pinch  Exercises Single Leg Stance - 2 x daily - 7 x weekly - 1 sets - 3 reps - 15-30 hold Shoulder Extension with Resistance - 1 x daily - 3 x weekly - 3 sets - 10 reps Split Stance Shoulder Row with Resistance - 1 x daily - 3 x weekly - 3 sets - 10 reps Standing Hip Extension with Counter Support - 1 x daily - 3 x weekly - 5 sets - 5 reps Supine Bridge - 1 x daily - 3 x weekly - 1 sets - 10 reps Seated Hamstring Stretch - 1 x daily - 7 x weekly - 1 sets - 2 reps - 20 seconds hold Half Dead Lift with Kettlebell

## 2020-09-14 ENCOUNTER — Encounter: Payer: BC Managed Care – PPO | Admitting: Physical Therapy

## 2020-09-17 ENCOUNTER — Ambulatory Visit: Payer: BC Managed Care – PPO | Admitting: Sports Medicine

## 2020-09-22 NOTE — Progress Notes (Signed)
65 y.o. G0P0 Married White or Caucasian Not Hispanic or Latino female here for annual exam.  No vaginal bleeding. Not sexually active. No issues with atrophy. No bowel or bladder issues.   She started lexapro this year for depression and anxiety, doing much better. Has struggled with body image issues, has a Chief Operating Officer. Husband has been supportive.     She was diagnosed with osteoporosis of her forearm in 10/20, no significant change in her hip, spine not imaged secondary to degenerative changes. She has a h/o vertebral fracture. Referred to Endocrinology and was started on Fosamax in 11/21  Patient's last menstrual period was 12/18/2013.          Sexually active: No.  The current method of family planning is post menopausal status.    Exercising: Yes.    4 times a week Smoker:  no  Health Maintenance: Pap:  08-29-18 negative, HR HPV negative            05-14-15 negative, HR HPV negative   H/O CIN I in 1998 History of abnormal Pap:  yes MMG:  06-27-2019 density C/BIRADS 1 negative, scheduled 09-24-20 at [facility] BMD:   06-27-2019 osteoporosis  Colonoscopy: 12-26-19 polyp, f/u in 5 years TDaP:  09-16-2019 Gardasil: n/a   reports that she has never smoked. She has never used smokeless tobacco. She reports current alcohol use of about 1.0 standard drink of alcohol per week. She reports that she does not use drugs. 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 Abnormal Pap smear, atypical squamous cells of undetermined sign (ASC-US) 1998   treated with cryo, CIN I  . ADD (attention deficit disorder) 02-23-12   easily distracted.  . Arthritis 02-23-12   Rt. hip and lt. arm  . Kidney calculi 07/2015  . Kidney stone   . PONV (postoperative nausea and vomiting) 02-23-12   always has PONV  . Post-nasal drainage 02-23-12   frequent a problem,causes a cough and mucus buildup in throat  . Urethral polyp 1998    Dr. Hartley Barefoot  . Varicose veins   . Venous insufficiency    chronic-denies any problems    Past Surgical History:  Procedure Laterality Date  . blood clot removed Left 01/2012   blood clot removed from left hand   . BREAST BIOPSY Right   . BREAST SURGERY  02-23-12   Rt. needle biopsy, recent -negative  path showed fibroadenoma  . COLONOSCOPY  08/2003   repeat 5 yrs.  . COLONOSCOPY W/ BIOPSIES  12/2008   1 polyp recheck 5 yrs  . EXCISION/RELEASE BURSA HIP  02/29/12  . HAND SURGERY  02-23-12   "blood clot" excised palm left hand 01-18-12  . KNEE ARTHROSCOPY W/ MENISCAL REPAIR  10/2012  . KNEE ARTHROSCOPY WITH EXCISION BAKER'S CYST Left 10/2012   meniscal repair  . ORIF METATARSAL FRACTURE Left 04/07/2017   Left 5th Metatarsal  . SHOULDER SURGERY Right 02/2015   Dr. Lennette Bihari Supple - [facility]  . 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 acetaminophen (TYLENOL) 325 MG tablet Take 650 mg by mouth as needed.    Marland Kitchen alendronate (FOSAMAX) 70 MG tablet Take by mouth.    . escitalopram (LEXAPRO) 10 MG tablet TAKE 1 TABLET BY MOUTH EVERY DAY 90 tablet  0  . gabapentin (NEURONTIN) 300 MG capsule Take 1 capsule (300 mg total) by mouth at bedtime. 30 capsule 3  . meloxicam (MOBIC) 15 MG tablet One tab PO qAM with a meal for 2 weeks, then daily prn pain. 30 tablet 3  . Multiple Vitamin (MULTIVITAMIN) tablet Take 1 tablet by mouth daily.    . TURMERIC PO Take by mouth.    Marland Kitchen VITAMIN D PO Take by mouth.     No current facility-administered medications for this visit.    Family History  Problem Relation Age of Onset  . Breast cancer Mother 1       breast  . Hyperlipidemia Mother   . Brain cancer Father 20       brain  . Osteoporosis Maternal Grandmother   . Drug abuse Brother     Review of Systems  Constitutional: Negative.   HENT: Negative.   Eyes: Negative.    Respiratory: Negative.   Cardiovascular: Negative.   Gastrointestinal: Negative.   Endocrine: Negative.   Genitourinary: Negative.   Musculoskeletal: Negative.   Skin: Negative.   Allergic/Immunologic: Negative.   Neurological: Negative.   Hematological: Negative.   Psychiatric/Behavioral: Negative.     Exam:   BP 120/70 (BP Location: Right Arm, Patient Position: Sitting, Cuff Size: Normal)   Pulse 68   Resp 16   Ht 5' (1.524 m)   Wt 143 lb (64.9 kg)   LMP 12/18/2013   BMI 27.93 kg/m   Weight change: @WEIGHTCHANGE @ Height:   Height: 5' (152.4 cm)  Ht Readings from Last 3 Encounters:  09/23/20 5' (1.524 m)  07/29/20 5\' 1"  (1.549 m)  05/27/20 5\' 1"  (1.549 m)    General appearance: alert, cooperative and appears stated age Head: Normocephalic, without obvious abnormality, atraumatic Neck: no adenopathy, supple, symmetrical, trachea midline and thyroid normal to inspection and palpation Lungs: clear to auscultation bilaterally Cardiovascular: regular rate and rhythm Breasts: normal appearance, no masses or tenderness Abdomen: soft, non-tender; non distended,  no masses,  no organomegaly Extremities: extremities normal, atraumatic, no cyanosis or edema Skin: Skin color, texture, turgor normal. No rashes or lesions Lymph nodes: Cervical, supraclavicular, and axillary nodes normal. No abnormal inguinal nodes palpated Neurologic: Grossly normal   Pelvic: External genitalia:  no lesions              Urethra:  normal appearing urethra with no masses, tenderness or lesions              Bartholins and Skenes: normal                 Vagina: atrophic appearing vagina with normal color and discharge, no lesions              Cervix: no lesions               Bimanual Exam:  Uterus:  normal size, contour, position, consistency, mobility, non-tender              Adnexa: no mass, fullness, tenderness               Rectovaginal: Confirms               Anus:  normal sphincter tone, no  lesions  Terence Lux chaperoned for the exam.  1. Well woman exam -no pap this year -mammogram is tomorrow -colonoscopy is UTD -Discussed breast self exam -Discussed calcium and vit D intake   2. History of osteoporosis On calcium, vit d, fosamax - DG  Bone Density; Future (ordered for 10/22)  3. Depression with anxiety On lexapro, doing well

## 2020-09-23 ENCOUNTER — Other Ambulatory Visit: Payer: Self-pay

## 2020-09-23 ENCOUNTER — Ambulatory Visit: Payer: BC Managed Care – PPO | Admitting: Obstetrics and Gynecology

## 2020-09-23 ENCOUNTER — Encounter: Payer: Self-pay | Admitting: Obstetrics and Gynecology

## 2020-09-23 ENCOUNTER — Ambulatory Visit (INDEPENDENT_AMBULATORY_CARE_PROVIDER_SITE_OTHER): Payer: BC Managed Care – PPO | Admitting: Obstetrics and Gynecology

## 2020-09-23 VITALS — BP 120/70 | HR 68 | Resp 16 | Ht 60.0 in | Wt 143.0 lb

## 2020-09-23 DIAGNOSIS — Z01419 Encounter for gynecological examination (general) (routine) without abnormal findings: Secondary | ICD-10-CM | POA: Diagnosis not present

## 2020-09-23 DIAGNOSIS — Z8739 Personal history of other diseases of the musculoskeletal system and connective tissue: Secondary | ICD-10-CM

## 2020-09-23 DIAGNOSIS — F418 Other specified anxiety disorders: Secondary | ICD-10-CM | POA: Diagnosis not present

## 2020-09-23 NOTE — Patient Instructions (Addendum)
EXERCISE   We recommended that you start or continue a regular exercise program for good health. Physical activity is anything that gets your body moving, some is better than none. The CDC recommends 150 minutes per week of Moderate-Intensity Aerobic Activity and 2 or more days of Muscle Strengthening Activity.  Benefits of exercise are limitless: helps weight loss/weight maintenance, improves mood and energy, helps with depression and anxiety, improves sleep, tones and strengthens muscles, improves balance, improves bone density, protects from chronic conditions such as heart disease, high blood pressure and diabetes and so much more. To learn more visit: https://www.cdc.gov/physicalactivity/index.html  DIET: Good nutrition starts with a healthy diet of fruits, vegetables, whole grains, and lean protein sources. Drink plenty of water for hydration. Minimize empty calories, sodium, sweets. For more information about dietary recommendations visit: https://health.gov/our-work/nutrition-physical-activity/dietary-guidelines and https://www.myplate.gov/  ALCOHOL:  Women should limit their alcohol intake to no more than 7 drinks/beers/glasses of wine (combined, not each!) per week. Moderation of alcohol intake to this level decreases your risk of breast cancer and liver damage.  If you are concerned that you may have a problem, or your friends have told you they are concerned about your drinking, there are many resources to help. A well-known program that is free, effective, and available to all people all over the nation is Alcoholics Anonymous.  Check out this site to learn more: https://www.aa.org/   CALCIUM AND VITAMIN D:  Adequate intake of calcium and Vitamin D are recommended for bone health.  You should be getting between 1000-1200 mg of calcium and 800 units of Vitamin D daily between diet and supplements  PAP SMEARS:  Pap smears, to check for cervical cancer or precancers,  have traditionally been  done yearly, scientific advances have shown that most women can have pap smears less often.  However, every woman still should have a physical exam from her gynecologist every year. It will include a breast check, inspection of the vulva and vagina to check for abnormal growths or skin changes, a visual exam of the cervix, and then an exam to evaluate the size and shape of the uterus and ovaries. We will also provide age appropriate advice regarding health maintenance, like when you should have certain vaccines, screening for sexually transmitted diseases, bone density testing, colonoscopy, mammograms, etc.   MAMMOGRAMS:  All women over 40 years old should have a routine mammogram.   COLON CANCER SCREENING: Now recommend starting at age 45. At this time colonoscopy is not covered for routine screening until 50. There are take home tests that can be done between 45-49.   COLONOSCOPY:  Colonoscopy to screen for colon cancer is recommended for all women at age 50.  We know, you hate the idea of the prep.  We agree, BUT, having colon cancer and not knowing it is worse!!  Colon cancer so often starts as a polyp that can be seen and removed at colonscopy, which can quite literally save your life!  And if your first colonoscopy is normal and you have no family history of colon cancer, most women don't have to have it again for 10 years.  Once every ten years, you can do something that may end up saving your life, right?  We will be happy to help you get it scheduled when you are ready.  Be sure to check your insurance coverage so you understand how much it will cost.  It may be covered as a preventative service at no cost, but you should check   your particular policy.      Breast Self-Awareness Breast self-awareness means being familiar with how your breasts look and feel. It involves checking your breasts regularly and reporting any changes to your health care provider. Practicing breast self-awareness is  important. A change in your breasts can be a sign of a serious medical problem. Being familiar with how your breasts look and feel allows you to find any problems early, when treatment is more likely to be successful. All women should practice breast self-awareness, including women who have had breast implants. How to do a breast self-exam One way to learn what is normal for your breasts and whether your breasts are changing is to do a breast self-exam. To do a breast self-exam: Look for Changes  1. Remove all the clothing above your waist. 2. Stand in front of a mirror in a room with good lighting. 3. Put your hands on your hips. 4. Push your hands firmly downward. 5. Compare your breasts in the mirror. Look for differences between them (asymmetry), such as: ? Differences in shape. ? Differences in size. ? Puckers, dips, and bumps in one breast and not the other. 6. Look at each breast for changes in your skin, such as: ? Redness. ? Scaly areas. 7. Look for changes in your nipples, such as: ? Discharge. ? Bleeding. ? Dimpling. ? Redness. ? A change in position. Feel for Changes Carefully feel your breasts for lumps and changes. It is best to do this while lying on your back on the floor and again while sitting or standing in the shower or tub with soapy water on your skin. Feel each breast in the following way:  Place the arm on the side of the breast you are examining above your head.  Feel your breast with the other hand.  Start in the nipple area and make  inch (2 cm) overlapping circles to feel your breast. Use the pads of your three middle fingers to do this. Apply light pressure, then medium pressure, then firm pressure. The light pressure will allow you to feel the tissue closest to the skin. The medium pressure will allow you to feel the tissue that is a little deeper. The firm pressure will allow you to feel the tissue close to the ribs.  Continue the overlapping circles,  moving downward over the breast until you feel your ribs below your breast.  Move one finger-width toward the center of the body. Continue to use the  inch (2 cm) overlapping circles to feel your breast as you move slowly up toward your collarbone.  Continue the up and down exam using all three pressures until you reach your armpit.  Write Down What You Find  Write down what is normal for each breast and any changes that you find. Keep a written record with breast changes or normal findings for each breast. By writing this information down, you do not need to depend only on memory for size, tenderness, or location. Write down where you are in your menstrual cycle, if you are still menstruating. If you are having trouble noticing differences in your breasts, do not get discouraged. With time you will become more familiar with the variations in your breasts and more comfortable with the exam. How often should I examine my breasts? Examine your breasts every month. If you are breastfeeding, the best time to examine your breasts is after a feeding or after using a breast pump. If you menstruate, the best time to   examine your breasts is 5-7 days after your period is over. During your period, your breasts are lumpier, and it may be more difficult to notice changes. When should I see my health care provider? See your health care provider if you notice:  A change in shape or size of your breasts or nipples.  A change in the skin of your breast or nipples, such as a reddened or scaly area.  Unusual discharge from your nipples.  A lump or thick area that was not there before.  Pain in your breasts.  Anything that concerns you.   Osteoporosis  Osteoporosis is thinning and loss of density in your bones. Osteoporosis makes bones more brittle and fragile and more likely to break (fracture). Over time, osteoporosis can cause your bones to become so weak that they fracture after a minor fall. Bones in  the hip, wrist, and spine are most likely to fracture due to osteoporosis. What are the causes? The exact cause of this condition is not known. What increases the risk? You may be at greater risk for osteoporosis if you:  Have a family history of the condition.  Have poor nutrition.  Use steroid medicines, such as prednisone.  Are female.  Are age 59 or older.  Smoke or have a history of smoking.  Are not physically active (are sedentary).  Are white (Caucasian) or of Asian descent.  Have a small body frame.  Take certain medicines, such as antiseizure medicines. What are the signs or symptoms? A fracture might be the first sign of osteoporosis, especially if the fracture results from a fall or injury that usually would not cause a bone to break. Other signs and symptoms include:  Pain in the neck or low back.  Stooped posture.  Loss of height. How is this diagnosed? This condition may be diagnosed based on:  Your medical history.  A physical exam.  A bone mineral density test, also called a DXA or DEXA test (dual-energy X-ray absorptiometry test). This test uses X-rays to measure the amount of minerals in your bones. How is this treated? The goal of treatment is to strengthen your bones and lower your risk for a fracture. Treatment may involve:  Making lifestyle changes, such as: ? Including foods with more calcium and vitamin D in your diet. ? Doing weight-bearing and muscle-strengthening exercises. ? Stopping tobacco use. ? Limiting alcohol intake.  Taking medicine to slow the process of bone loss or to increase bone density.  Taking daily supplements of calcium and vitamin D.  Taking hormone replacement medicines, such as estrogen for women and testosterone for men.  Monitoring your levels of calcium and vitamin D. Follow these instructions at home:  Activity  Exercise as told by your health care provider. Ask your health care provider what exercises  and activities are safe for you. You should do: ? Exercises that make you work against gravity (weight-bearing exercises), such as tai chi, yoga, or walking. ? Exercises to strengthen muscles, such as lifting weights. Lifestyle  Limit alcohol intake to no more than 1 drink a day for nonpregnant women and 2 drinks a day for men. One drink equals 12 oz of beer, 5 oz of wine, or 1 oz of hard liquor.  Do not use any products that contain nicotine or tobacco, such as cigarettes and e-cigarettes. If you need help quitting, ask your health care provider. Preventing falls  Use devices to help you move around (mobility aids) as needed, such as canes,  walkers, scooters, or crutches.  Keep rooms well-lit and clutter-free.  Remove tripping hazards from walkways, including cords and throw rugs.  Install grab bars in bathrooms and safety rails on stairs.  Use rubber mats in the bathroom and other areas that are often wet or slippery.  Wear closed-toe shoes that fit well and support your feet. Wear shoes that have rubber soles or low heels.  Review your medicines with your health care provider. Some medicines can cause dizziness or changes in blood pressure, which can increase your risk of falling. General instructions  Include calcium and vitamin D in your diet. Calcium is important for bone health, and vitamin D helps your body to absorb calcium. Good sources of calcium and vitamin D include: ? Certain fatty fish, such as salmon and tuna. ? Products that have calcium and vitamin D added to them (fortified products), such as fortified cereals. ? Egg yolks. ? Cheese. ? Liver.  Take over-the-counter and prescription medicines only as told by your health care provider.  Keep all follow-up visits as told by your health care provider. This is important. Contact a health care provider if:  You have never been screened for osteoporosis and you are: ? A woman who is age 90 or older. ? A man who is  age 67 or older. Get help right away if:  You fall or injure yourself. Summary  Osteoporosis is thinning and loss of density in your bones. This makes bones more brittle and fragile and more likely to break (fracture),even with minor falls.  The goal of treatment is to strengthen your bones and reduce your risk for a fracture.  Include calcium and vitamin D in your diet. Calcium is important for bone health, and vitamin D helps your body to absorb calcium.  Talk with your health care provider about screening for osteoporosis if you are a woman who is age 61 or older, or a man who is age 35 or older. This information is not intended to replace advice given to you by your health care provider. Make sure you discuss any questions you have with your health care provider. Document Revised: 08/18/2017 Document Reviewed: 06/30/2017 Elsevier Patient Education  2020 [employer].

## 2020-09-24 ENCOUNTER — Ambulatory Visit (INDEPENDENT_AMBULATORY_CARE_PROVIDER_SITE_OTHER): Payer: BC Managed Care – PPO

## 2020-09-24 ENCOUNTER — Ambulatory Visit (INDEPENDENT_AMBULATORY_CARE_PROVIDER_SITE_OTHER): Payer: BC Managed Care – PPO | Admitting: Sports Medicine

## 2020-09-24 DIAGNOSIS — M51369 Other intervertebral disc degeneration, lumbar region without mention of lumbar back pain or lower extremity pain: Secondary | ICD-10-CM

## 2020-09-24 DIAGNOSIS — M5136 Other intervertebral disc degeneration, lumbar region: Secondary | ICD-10-CM

## 2020-09-24 DIAGNOSIS — Z1231 Encounter for screening mammogram for malignant neoplasm of breast: Secondary | ICD-10-CM | POA: Diagnosis not present

## 2020-09-24 DIAGNOSIS — H6982 Other specified disorders of Eustachian tube, left ear: Secondary | ICD-10-CM | POA: Diagnosis not present

## 2020-09-24 DIAGNOSIS — H6992 Unspecified Eustachian tube disorder, left ear: Secondary | ICD-10-CM | POA: Insufficient documentation

## 2020-09-24 MED ORDER — TRAMADOL HCL 50 MG PO TABS: 50.0000 mg | ORAL_TABLET | Freq: Three times a day (TID) | ORAL | 0 refills | Status: DC | PRN

## 2020-09-24 NOTE — Assessment & Plan Note (Signed)
Left-sided ear pain and pressure with the change in weather, external auditory exam is completely unremarkable. No sinus pain or pressure, minimal runny nose/congested nasopharynx. She will do Flonase and oral decongestants

## 2020-09-24 NOTE — Progress Notes (Signed)
    Procedures performed today:    None.  Independent interpretation of notes and tests performed by another provider:   None.  Brief History, Exam, Impression, and Recommendations:    DDD (degenerative disc disease), lumbar Wanda Gonzales returns, she is a pleasant 65 year old female with multilevel lumbar DDD, L2 and L3 retrolisthesis as well. She has done really well with formal physical therapy, aquatic therapy with Candise Bowens. I am going to order some more of this. She does have a trip coming up to [place] at the end of this month, we will add tramadol to use as needed for her trip. Return to see me as needed.  Dysfunction of Eustachian tube, left Left-sided ear pain and pressure with the change in weather, external auditory exam is completely unremarkable. No sinus pain or pressure, minimal runny nose/congested nasopharynx. She will do Flonase and oral decongestants    ___________________________________________ Ihor Austin. Benjamin Stain, M.D., ABFM., CAQSM. Primary Care and Sports Medicine [facility]  Adjunct Instructor of Family Medicine  [employer]

## 2020-09-24 NOTE — Assessment & Plan Note (Signed)
Wanda Gonzales returns, she is a pleasant 65 year old female with multilevel lumbar DDD, L2 and L3 retrolisthesis as well. She has done really well with formal physical therapy, aquatic therapy with Candise Bowens. I am going to order some more of this. She does have a trip coming up to [place] at the end of this month, we will add tramadol to use as needed for her trip. Return to see me as needed.

## 2020-09-30 ENCOUNTER — Ambulatory Visit: Payer: BC Managed Care – PPO | Admitting: Physical Therapy

## 2020-09-30 ENCOUNTER — Other Ambulatory Visit: Payer: Self-pay

## 2020-09-30 DIAGNOSIS — M545 Low back pain, unspecified: Secondary | ICD-10-CM | POA: Diagnosis not present

## 2020-09-30 DIAGNOSIS — G8929 Other chronic pain: Secondary | ICD-10-CM | POA: Diagnosis not present

## 2020-09-30 DIAGNOSIS — M6281 Muscle weakness (generalized): Secondary | ICD-10-CM | POA: Diagnosis not present

## 2020-09-30 DIAGNOSIS — R296 Repeated falls: Secondary | ICD-10-CM | POA: Diagnosis not present

## 2020-09-30 NOTE — Therapy (Signed)
[place] [place], [place], [address] Phone: [phone]   Fax:  [phone]  Physical Therapy Treatment  Patient Details  Name: Wanda Gonzales MRN: [id number] Date of Birth: 1956-09-13 Referring Provider (PT): Dr. Sharyn Dross   Encounter Date: 09/30/2020   PT End of Session - 09/30/20 1021    Visit Number 6    Number of Visits 16    Date for PT Re-Evaluation 10/14/20    Authorization Type [employer] PPO    Authorization Time Period 08/19/20 to 10/14/2020    Authorization - Visit Number 6    Authorization - Number of Visits 9    PT Start Time 1017    PT Stop Time 1100    PT Time Calculation (min) 43 min    Activity Tolerance Patient tolerated treatment well    Behavior During Therapy [facility] for tasks assessed/performed           Past Medical History:  Diagnosis Date  . Abnormal Pap smear, atypical squamous cells of undetermined sign (ASC-US) 1998   treated with cryo, CIN I  . ADD (attention deficit disorder) 02-23-12   easily distracted.  . Arthritis 02-23-12   Rt. hip and lt. arm  . Depression with anxiety   . History of osteoporosis   . Kidney calculi 07/2015  . Kidney stone   . PONV (postoperative nausea and vomiting) 02-23-12   always has PONV  . Post-nasal drainage 02-23-12   frequent a problem,causes a cough and mucus buildup in throat  . Urethral polyp 1998   Dr. Hartley Barefoot  . Varicose veins   . Venous insufficiency    chronic-denies any problems    Past Surgical History:  Procedure Laterality Date  . blood clot removed Left 01/2012   blood clot removed from left hand   . BREAST BIOPSY Right   . BREAST SURGERY  02-23-12   Rt. needle biopsy, recent -negative  path showed fibroadenoma  . COLONOSCOPY  08/2003   repeat 5 yrs.  . COLONOSCOPY W/ BIOPSIES  12/2008   1 polyp recheck 5 yrs  . EXCISION/RELEASE BURSA HIP  02/29/12  . HAND SURGERY  02-23-12   "blood clot" excised palm left hand 01-18-12  . KNEE  ARTHROSCOPY W/ MENISCAL REPAIR  10/2012  . KNEE ARTHROSCOPY WITH EXCISION BAKER'S CYST Left 10/2012   meniscal repair  . ORIF METATARSAL FRACTURE Left 04/07/2017   Left 5th Metatarsal  . SHOULDER SURGERY Right 02/2015   Dr. Lennette Bihari Supple - [facility]  . 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   Subjective Assessment - 09/30/20 1022    Subjective Pt reports that now she is back to work, her back is hurting more.  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    Currently in Pain? Yes    Pain Score 4     Pain Location Back    Pain Orientation Lower;Mid    Pain Descriptors / Indicators Sore    Aggravating Factors  bending forward    Pain Relieving Factors not bending forward.              [facility] PT Assessment - 09/30/20 0001      Assessment   Medical Diagnosis lumbar DDD    Referring Provider (PT) Dr. Sharyn Dross    Onset Date/Surgical Date --   chronic for years  Prior Therapy multiple bouts of therapy for hip/back/knee       Strength   Right Hip Flexion 4/5    Right Hip Extension 4-/5    Right Hip ABduction 4+/5    Left Hip Flexion 4+/5    Left Hip Extension 4+/5    Left Hip ABduction 4+/5      Dynamic Gait Index   Level Surface Normal    Change in Gait Speed Normal    Gait with Horizontal Head Turns Mild Impairment   veers Lt with Lt head turn.   Gait with Vertical Head Turns Normal    Gait and Pivot Turn Mild Impairment    Step Over Obstacle Mild Impairment    Step Around Obstacles Normal    Steps Mild Impairment    Total Score 20                         [facility] Adult PT Treatment/Exercise - 09/30/20 0001      Lumbar Exercises: Stretches   Passive Hamstring Stretch Right;Left;2 reps;20 seconds   seated with hip hinge, back straight.   Prone on Elbows Stretch 1 rep;20 seconds    Quad Stretch Right;Left;2 reps;30 seconds   prone with strap      Lumbar Exercises: Aerobic   Tread Mill 4 min at 1.6 mph for warm up      Lumbar Exercises: Standing   Row Strengthening;Both;10 reps    Theraband Level (Row) Level 3 (Green)    Shoulder Extension Strengthening;10 reps    Theraband Level (Shoulder Extension) Level 3 (Green)    Other Standing Lumbar Exercises SLS on LLE x 15 sec      Lumbar Exercises: Supine   Other Supine Lumbar Exercises discussed flexion based exercises to avoid (supine and standing)    Other Supine Lumbar Exercises Pilates hundred- unable to tolerated neck flexion, modified to head in neutral and knees/hips flexed to 90 deg and arms pumping x100.      Lumbar Exercises: Prone   Opposite Arm/Leg Raise 5 reps;Right arm/Left leg;Left arm/Right leg   2 sets   Other Prone Lumbar Exercises prone on elbows with arm reaches x 5 reps each arm                    PT Short Term Goals - 09/30/20 1422      PT SHORT TERM GOAL #1   Title Patient to report pain as being no more than 5/10 at worst    Time 4    Period Weeks    Status Partially Met    Target Date 09/16/20      PT SHORT TERM GOAL #2   Title Patient to show improved mechanics in terms of lifting/squatting/bending with cues only 50% of the time in order to show improved awareness of good ergonomics    Time 4    Period Weeks    Status Achieved      PT SHORT TERM GOAL #3   Title Patient to be able to maintain SLS for at least 15 seconds on L LE in order to reduce fall risk and show improved overall strength    Time 4    Period Weeks    Status Achieved      PT SHORT TERM GOAL #4   Title Patient to be independent in HEP, to be updated weekly    Time 4    Period Weeks    Status Partially Met  PT Long Term Goals - 09/30/20 1413      PT LONG TERM GOAL #1   Title Patient to show MMT as having improved by at least 1 grade in order to show improved functional strength    Time 8    Period Weeks    Status Achieved      PT LONG  TERM GOAL #2   Title Patient to report pain as being no more than 2/10 in order to improve QOL and facilitate trip to [facility] next year    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; 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    Time 8    Period Weeks    Status Partially Met      PT LONG TERM GOAL #3   Title Patient to score at least 20/24 on DGI in order to show reduced fall risk    Time 8    Period Weeks    Status Achieved      PT LONG TERM GOAL #4   Title Patient to demonstrate ability to maintain good body mechanics with simulated work and home care tasks with cues no more than 25% of the time    Time 8    Period Weeks    Status Partially Met                 Plan - 09/30/20 1414    Clinical Impression Statement Pt reporting increased mid/low back pain after returning to work; 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  Encouraged pt trial sitting on stool at art table instead of forward flexing spine.  Pt demonstrated improved hip strength and DGI; has met 2 goals.  Encouraged pt to continue avoiding exercises with a lot of flexion of spine due to previous compression fracture and osteoporosis (with trainer, ie: supine sit ups, burpees, bear crawls) and instead focus on back extension exercises (opp arm/leg in prone). She tolerated exercises without increase in pain.  Pt verbalized desire to hold therapy after next aquatic appt.  She has partially met her goals    Rehab Potential Good    PT Frequency 2x / week    PT Duration 8 weeks    PT Treatment/Interventions ADLs/Self Care Home Management;Aquatic Therapy;Cryotherapy;Electrical Stimulation;Iontophoresis 10m/ml Dexamethasone;Moist Heat;Ultrasound;DME Instruction;Gait training;Stair training;Functional mobility training;Therapeutic activities;Therapeutic exercise;Balance training;Neuromuscular re-education;Patient/family education;Orthotic Fit/Training;Manual techniques;Passive range of motion;Dry  needling;Taping;Vasopneumatic Device;Joint Manipulations    PT Next Visit Plan finalize water HEP/ land HEP. Bring copy of water HEP to pool session.    PT Home Exercise Plan [email or web address]    Consulted and Agree with Plan of Care Patient           Patient will benefit from skilled therapeutic intervention in order to improve the following deficits and impairments:  Abnormal gait,Difficulty walking,Decreased endurance,Increased muscle spasms,Decreased activity tolerance,Pain,Decreased balance,Impaired flexibility,Improper body mechanics,Decreased mobility,Decreased strength,Postural dysfunction  Visit Diagnosis: Chronic bilateral low back pain without sciatica  Muscle weakness (generalized)  Repeated falls     Problem List Patient Active Problem List   Diagnosis Date Noted  . Dysfunction of Eustachian tube, left 09/24/2020  . Depression with anxiety   . History of osteoporosis   . Age-related osteoporosis without current pathological fracture 07/29/2020  . Osteoporosis 07/27/2020  . 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 Rhinitis 03/18/2020  . Seborrheic keratosis 11/23/2017  . Closed displaced fracture of fifth metatarsal bone of left foot 03/31/2017  . 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  Benign paroxysmal positional vertigo 08/25/2016  . Mid back pain, chronic 12/16/2015  . Cervical strain 11/21/2014  . Right shoulder pain 05/15/2014  . DDD (degenerative disc disease), lumbar 02/17/2014  . Vitamin D deficiency 03/18/2013  . Trochanteric bursitis of right hip 02/29/2012  . INSOMNIA 04/13/2010  . Hyperlipidemia 09/17/2007  . CERVICAL POLYP 09/17/2007  . Attention deficit disorder 09/11/2007  . UNSPECIFIED VENOUS INSUFFICIENCY 08/29/2007   Kerin Perna, PTA 09/30/20 2:23 PM  [place], [place], [address] Phone: [phone]   Fax:  [phone]  Name: MAVA SUARES MRN:  [id number] Date of Birth: 08-06-1956

## 2020-10-02 ENCOUNTER — Ambulatory Visit (INDEPENDENT_AMBULATORY_CARE_PROVIDER_SITE_OTHER): Payer: BC Managed Care – PPO | Admitting: Physical Therapy

## 2020-10-02 ENCOUNTER — Other Ambulatory Visit: Payer: Self-pay

## 2020-10-02 ENCOUNTER — Encounter: Payer: Self-pay | Admitting: Physical Therapy

## 2020-10-02 DIAGNOSIS — R296 Repeated falls: Secondary | ICD-10-CM | POA: Diagnosis not present

## 2020-10-02 DIAGNOSIS — G8929 Other chronic pain: Secondary | ICD-10-CM | POA: Diagnosis not present

## 2020-10-02 DIAGNOSIS — M545 Low back pain, unspecified: Secondary | ICD-10-CM

## 2020-10-02 DIAGNOSIS — M6281 Muscle weakness (generalized): Secondary | ICD-10-CM | POA: Diagnosis not present

## 2020-10-02 NOTE — Therapy (Addendum)
[place], [place], [address] Phone: [phone]   Fax:  [phone]  Physical Therapy Treatment and Discharge  Patient Details  Name: Wanda Gonzales MRN: [id number] Date of Birth: [birth year] Referring Provider (PT): Dr. Sharyn Dross   Encounter Date: 10/02/2020   PT End of Session - 10/02/20 1617    Visit Number 7    Number of Visits 16    Date for PT Re-Evaluation 10/14/20    Authorization Type [employer] PPO    Authorization Time Period 08/19/20 to 10/14/2020    Authorization - Visit Number 7    Authorization - Number of Visits 9    PT Start Time 4709    PT Stop Time 1432    PT Time Calculation (min) 45 min    Activity Tolerance Patient tolerated treatment well    Behavior During Therapy [facility] for tasks assessed/performed           Past Medical History:  Diagnosis Date  . Abnormal Pap smear, atypical squamous cells of undetermined sign (ASC-US) 1998   treated with cryo, CIN I  . ADD (attention deficit disorder) 02-23-12   easily distracted.  . Arthritis 02-23-12   Rt. hip and lt. arm  . Depression with anxiety   . History of osteoporosis   . Kidney calculi 07/2015  . Kidney stone   . PONV (postoperative nausea and vomiting) 02-23-12   always has PONV  . Post-nasal drainage 02-23-12   frequent a problem,causes a cough and mucus buildup in throat  . Urethral polyp 1998   Dr. Hartley Barefoot  . Varicose veins   . Venous insufficiency    chronic-denies any problems    Past Surgical History:  Procedure Laterality Date  . blood clot removed Left 01/2012   blood clot removed from left hand   . BREAST BIOPSY Right   . BREAST SURGERY  02-23-12   Rt. needle biopsy, recent -negative  path showed fibroadenoma  . COLONOSCOPY  08/2003   repeat 5 yrs.  . COLONOSCOPY W/ BIOPSIES  12/2008   1 polyp recheck 5 yrs  . EXCISION/RELEASE BURSA HIP  02/29/12  . HAND SURGERY  02-23-12   "blood clot" excised palm left hand  01-18-12  . KNEE ARTHROSCOPY W/ MENISCAL REPAIR  10/2012  . KNEE ARTHROSCOPY WITH EXCISION BAKER'S CYST Left 10/2012   meniscal repair  . ORIF METATARSAL FRACTURE Left 04/07/2017   Left 5th Metatarsal  . SHOULDER SURGERY Right 02/2015   Dr. Lennette Bihari Supple - [facility]  . 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   Subjective Assessment - 10/02/20 1618    Subjective British reports that she was very sore yesterday, with both exercising at therapy session and then more at the pool. Today she is feeling pretty good.  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    Currently in Pain? No/denies    Pain Score 0-No pain           Pt seen for aquatic therapy today.  Treatment took place in water 3-4 ft in depth at [facility]. Temp of water was 87 deg.  Pt entered/exited the pool via stairs independently with single rail.  Treatment:   Forward / backward walking 50 yds each for warm up.  - Shoulder push/ pull  of noodle with core engaged, noodle slightly submerged, lower trap engaged x 10 - Arm press onto noodle with elbows straight x 10, cues to not hike upper traps.  - Standing forward single knee pull to noodle x 10,  Repeated followed by T body position with arms forward on noodle x 10 each leg.  - Plie/ releve' first position, arms resting on noodle x 10 - Mermaid (heels together, toes out) open stance to closed stance with glute, TA, and adductor engagement x 10 each direction.  - Develope' to front, straighten knee, pull straight leg down to neutral stance x 5 each leg; repeated with Develope' to side x 5 each leg (arms resting on noodle) - jumping footwork - first to/from second position x 5 reps  - Octopush backwards x 8 reps  - hurdle single leg x 5 in stationary position, repeated with opposite arm sweep with abdominal  engagement x 5 each side.  - Curtsy with arms on noodle x 5, then curtsy with knee to noodle (hip abdct) x 5, then with knee to noodle and opp arm overhead x 5  - Triangle pose x 2 reps each side (hand to single thigh, opp hand reaching to ceiling) x 10 sec each.   Pt requires buoyancy for support and to offload joints with strengthening exercises. Viscosity of the water is needed for resistance of strengthening; water current perturbations provides challenge to standing balance unsupported, requiring increased core activation.     PT Short Term Goals - 09/30/20 1422      PT SHORT TERM GOAL #1   Title Patient to report pain as being no more than 5/10 at worst    Time 4    Period Weeks    Status Partially Met    Target Date 09/16/20      PT SHORT TERM GOAL #2   Title Patient to show improved mechanics in terms of lifting/squatting/bending with cues only 50% of the time in order to show improved awareness of good ergonomics    Time 4    Period Weeks    Status Achieved      PT SHORT TERM GOAL #3   Title Patient to be able to maintain SLS for at least 15 seconds on L LE in order to reduce fall risk and show improved overall strength    Time 4    Period Weeks    Status Achieved      PT SHORT TERM GOAL #4   Title Patient to be independent in HEP, to be updated weekly    Time 4    Period Weeks    Status Partially Met             PT Long Term Goals - 09/30/20 1413      PT LONG TERM GOAL #1   Title Patient to show MMT as having improved by at least 1 grade in order to show improved functional strength    Time 8    Period Weeks    Status Achieved      PT LONG TERM GOAL #2   Title Patient to report pain as being no more than 2/10 in order to improve QOL and facilitate trip to [facility] next year    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; 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    Time 8    Period Weeks    Status Partially Met      PT LONG TERM GOAL #3  Title Patient to score at least 20/24 on DGI in order to show reduced fall risk    Time 8    Period Weeks    Status Achieved      PT LONG TERM GOAL #4   Title Patient to demonstrate ability to maintain good body mechanics with simulated work and home care tasks with cues no more than 25% of the time    Time 8    Period Weeks    Status Partially Met                 Plan - 10/02/20 1620    Clinical Impression Statement Trialed new aquatic exercises with patient which included more core engagement and single leg exercises with minimal UE support; pt required cues for more upright posture throughout session.  Pt completed exercises with only minor discomfort to midback with more challenging exercises; pt compensated decreased balance by forward flexing trunk.  Pt requested to hold onto ledge/rail for more complex single leg exercises due to decreased balance with Rt SLS; encouraged pt to use this strategy with pool exercises with improved posture until she gains more confidence.  Pt verbalized readiness to continue HEP on own; will hold therapy for 30 days and pt will call if she has questions or a flare up of symptoms.  Pt has partially met her goals.    Rehab Potential Good    PT Frequency 2x / week    PT Duration 8 weeks    PT Treatment/Interventions ADLs/Self Care Home Management;Aquatic Therapy;Cryotherapy;Electrical Stimulation;Iontophoresis 65m/ml Dexamethasone;Moist Heat;Ultrasound;DME Instruction;Gait training;Stair training;Functional mobility training;Therapeutic activities;Therapeutic exercise;Balance training;Neuromuscular re-education;Patient/family education;Orthotic Fit/Training;Manual techniques;Passive range of motion;Dry needling;Taping;Vasopneumatic Device;Joint Manipulations    PT Next Visit Plan hold therapy until 11/02/20. If pt doesn't return, will d/c.    PT Home Exercise Plan [id number]    Consulted and Agree with Plan of Care Patient           Patient will  benefit from skilled therapeutic intervention in order to improve the following deficits and impairments:  Abnormal gait,Difficulty walking,Decreased endurance,Increased muscle spasms,Decreased activity tolerance,Pain,Decreased balance,Impaired flexibility,Improper body mechanics,Decreased mobility,Decreased strength,Postural dysfunction  Visit Diagnosis: Chronic bilateral low back pain without sciatica  Muscle weakness (generalized)  Repeated falls     Problem List Patient Active Problem List   Diagnosis Date Noted  . Dysfunction of Eustachian tube, left 09/24/2020  . Depression with anxiety   . History of osteoporosis   . Age-related osteoporosis without current pathological fracture 07/29/2020  . Osteoporosis 07/27/2020  . 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 Rhinitis 03/18/2020  . Seborrheic keratosis 11/23/2017  . Closed displaced fracture of fifth metatarsal bone of left foot 03/31/2017  . 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 Benign paroxysmal positional vertigo 08/25/2016  . Mid back pain, chronic 12/16/2015  . Cervical strain 11/21/2014  . Right shoulder pain 05/15/2014  . DDD (degenerative disc disease), lumbar 02/17/2014  . Vitamin D deficiency 03/18/2013  . Trochanteric bursitis of right hip 02/29/2012  . INSOMNIA 04/13/2010  . Hyperlipidemia 09/17/2007  . CERVICAL POLYP 09/17/2007  . Attention deficit disorder 09/11/2007  . UNSPECIFIED VENOUS INSUFFICIENCY 08/29/2007   PHYSICAL THERAPY DISCHARGE SUMMARY  Visits from Start of Care: 7  Current functional level related to goals / functional outcomes: Goals partially met, pt ready to progress to HEP   Remaining deficits: See above   Education / Equipment: HEP Plan: Patient agrees to discharge.  Patient goals were partially met. Patient is being discharged due to  being pleased with the current functional level.  ?????     Isabelle Course, PT,DPT03/23/222:56 PM  Kerin Perna,  PTA 10/02/20 4:45 PM  [facility] [place], [place], [address] Phone: [phone]   Fax:  [phone]  Name: AVERIANA CLOUATRE MRN: [id number] Date of Birth: Sep 29, 1955

## 2020-10-26 ENCOUNTER — Other Ambulatory Visit: Payer: Self-pay | Admitting: Family Medicine

## 2020-11-05 ENCOUNTER — Ambulatory Visit: Payer: BC Managed Care – PPO | Admitting: Sports Medicine

## 2020-11-26 ENCOUNTER — Ambulatory Visit (INDEPENDENT_AMBULATORY_CARE_PROVIDER_SITE_OTHER): Payer: BC Managed Care – PPO | Admitting: Family Medicine

## 2020-11-26 ENCOUNTER — Other Ambulatory Visit: Payer: Self-pay

## 2020-11-26 ENCOUNTER — Encounter: Payer: Self-pay | Admitting: Family Medicine

## 2020-11-26 VITALS — BP 116/68 | HR 74 | Ht 61.0 in

## 2020-11-26 DIAGNOSIS — F418 Other specified anxiety disorders: Secondary | ICD-10-CM | POA: Diagnosis not present

## 2020-11-26 DIAGNOSIS — R296 Repeated falls: Secondary | ICD-10-CM | POA: Diagnosis not present

## 2020-11-26 MED ORDER — ESCITALOPRAM OXALATE 10 MG PO TABS: 10.0000 mg | ORAL_TABLET | Freq: Every day | ORAL | 1 refills | Status: DC

## 2020-11-26 NOTE — Progress Notes (Signed)
Established Patient Office Visit  Subjective:  Patient ID: Wanda Gonzales, female    DOB: [birth year]  Age: 65 y.o. MRN: [id number]  CC: No chief complaint on file.   HPI Wanda Gonzales presents for mood, she still taking her Lexapro and doing well overall.  She is working with a Chief Operating Officer still not helpful she does workout about 4 days/week including swimming.  She does note that she has gained a little bit of weight but she is really trying hard not to weigh herself and is very triggering for her but she has noticed her clothes are fitting a little bit more tightly.  Did want to discuss that she does fall frequently she says every few months she usually falls she cannot really give a good explanation why.  She denies feeling lightheaded dizzy.  She denies feeling weak or like her joints give out she just seems to fall.  She says she has already removed some rugs etc. from her house just to reduce her risk of falling but she is always fearful of it.  She tries to use hand rails when she is going up or down stairs.  In fact she fell couple of months ago.  Past Medical History:  Diagnosis Date  . Abnormal Pap smear, atypical squamous cells of undetermined sign (ASC-US) 1998   treated with cryo, CIN I  . ADD (attention deficit disorder) 02-23-12   easily distracted.  . Arthritis 02-23-12   Rt. hip and lt. arm  . Closed displaced fracture of fifth metatarsal bone of left foot 03/31/2017  . Depression with anxiety   . History of osteoporosis   . Kidney calculi 07/2015  . Kidney stone   . PONV (postoperative nausea and vomiting) 02-23-12   always has PONV  . Post-nasal drainage 02-23-12   frequent a problem,causes a cough and mucus buildup in throat  . Urethral polyp 1998   Dr. Hartley Barefoot  . Varicose veins   . Venous insufficiency    chronic-denies any problems    Past Surgical History:  Procedure Laterality Date  . blood clot removed Left 01/2012   blood clot removed from left hand   . BREAST  BIOPSY Right   . BREAST SURGERY  02-23-12   Rt. needle biopsy, recent -negative  path showed fibroadenoma  . COLONOSCOPY  08/2003   repeat 5 yrs.  . COLONOSCOPY W/ BIOPSIES  12/2008   1 polyp recheck 5 yrs  . EXCISION/RELEASE BURSA HIP  02/29/12  . HAND SURGERY  02-23-12   "blood clot" excised palm left hand 01-18-12  . KNEE ARTHROSCOPY W/ MENISCAL REPAIR  10/2012  . KNEE ARTHROSCOPY WITH EXCISION BAKER'S CYST Left 10/2012   meniscal repair  . ORIF METATARSAL FRACTURE Left 04/07/2017   Left 5th Metatarsal  . SHOULDER SURGERY Right 02/2015   Dr. Lennette Bihari Supple - [facility]  . 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 Breast cancer Mother 73       breast  . Hyperlipidemia Mother   . Brain cancer Father 28       brain  . Osteoporosis Maternal Grandmother   . Drug abuse Brother     Social History   Socioeconomic History  . Marital status: Married    Spouse name: Not on file  . Number of children: Not on file  . Years of education: Not  on file  . Highest education level: Not on file  Occupational History  . Not on file  Tobacco Use  . Smoking status: Never Smoker  . Smokeless tobacco: Never Used  Vaping Use  . Vaping Use: Never used  Substance and Sexual Activity  . Alcohol use: Yes    Alcohol/week: 1.0 standard drink    Types: 1 Glasses of wine per week    Comment: rare occ.  . Drug use: No  . Sexual activity: Not Currently    Birth control/protection: Post-menopausal  Other Topics Concern  . 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   Social Determinants of Health   Financial Resource Strain: Not on file  Food Insecurity: Not on file  Transportation Needs: Not on file  Physical Activity: Not on file  Stress: Not on file  Social Connections: Not on file  Intimate Partner Violence: Not on file     Outpatient Medications Prior to Visit  Medication Sig Dispense Refill  . acetaminophen (TYLENOL) 325 MG tablet Take 650 mg by mouth as needed.    Marland Kitchen alendronate (FOSAMAX) 70 MG tablet Take 70 mg by mouth once a week.    . gabapentin (NEURONTIN) 300 MG capsule Take 1 capsule (300 mg total) by mouth at bedtime. 30 capsule 3  . meloxicam (MOBIC) 15 MG tablet One tab PO qAM with a meal for 2 weeks, then daily prn pain. 30 tablet 3  . Multiple Vitamin (MULTIVITAMIN) tablet Take 1 tablet by mouth daily.    . traMADol (ULTRAM) 50 MG tablet Take 1-2 tablets (50-100 mg total) by mouth every 8 (eight) hours as needed for moderate pain. Maximum 6 tabs per day. 21 tablet 0  . TURMERIC PO Take by mouth daily.    Marland Kitchen VITAMIN D PO Take by mouth daily.    Marland Kitchen escitalopram (LEXAPRO) 10 MG tablet TAKE 1 TABLET BY MOUTH EVERY DAY 90 tablet 0   No facility-administered medications prior to visit.    Allergies  Allergen Reactions  . Codeine Other (See Comments)    REACTION: nausea and dizzy  . Doxycycline Nausea Only  . Concerta [Methylphenidate] Hives and Rash  . Sulfa Antibiotics Nausea Only    ROS Review of Systems    Objective:    Physical Exam Constitutional:      Appearance: She is well-developed.  HENT:     Head: Normocephalic and atraumatic.  Cardiovascular:     Rate and Rhythm: Normal rate and regular rhythm.     Heart sounds: Normal heart sounds.  Pulmonary:     Effort: Pulmonary effort is normal.     Breath sounds: Normal breath sounds.  Skin:    General: Skin is warm and dry.  Neurological:     Mental Status: She is alert and oriented to person, place, and time.  Psychiatric:        Behavior: Behavior normal.     BP 116/68   Pulse 74   LMP 12/18/2013   SpO2 100%  Wt Readings from Last 3 Encounters:  09/23/20 143 lb (64.9 kg)  07/29/20 137 lb (62.1 kg)  05/27/20 137 lb (62.1 kg)     There are no preventive care reminders to display for this patient.  There are no  preventive care reminders to display for this patient.  Lab Results  Component Value Date   TSH 1.27 06/14/2018   Lab Results  Component Value Date  WBC 4.3 08/07/2020   HGB 13.9 08/07/2020   HCT 41.7 08/07/2020   MCV 90.7 08/07/2020   PLT 260 08/07/2020   Lab Results  Component Value Date   NA 142 08/07/2020   K 4.3 08/07/2020   CO2 29 08/07/2020   GLUCOSE 84 08/07/2020   BUN 20 08/07/2020   CREATININE 0.88 08/07/2020   BILITOT 0.6 08/07/2020   ALKPHOS 89 09/16/2019   AST 20 08/07/2020   ALT 12 08/07/2020   PROT 6.8 08/07/2020   ALBUMIN 4.4 09/16/2019   CALCIUM 9.3 08/07/2020   Lab Results  Component Value Date   CHOL 233 (H) 08/07/2020   Lab Results  Component Value Date   HDL 58 08/07/2020   Lab Results  Component Value Date   LDLCALC 156 (H) 08/07/2020   Lab Results  Component Value Date   TRIG 86 08/07/2020   Lab Results  Component Value Date   CHOLHDL 4.0 08/07/2020   Lab Results  Component Value Date   HGBA1C 4.9 12/19/2006      Assessment & Plan:   Problem List Items Addressed This Visit      Other   Frequent falls   Depression with anxiety    He is doing well overall continuing her work with her Chief Operating Officer.  Happy with her dose on the Lexapro will make sure that she has refills.  Follow-up in 6 months.      Relevant Medications   escitalopram (LEXAPRO) 10 MG tablet   RESOLVED: Anxious depression - Primary   Relevant Medications   escitalopram (LEXAPRO) 10 MG tablet     Frequent falls-not able to pinpoint any specific cause that we could actually intervene with or correct.  She is already remove some throw rugs etc. from the house which is perfect just encouraged her to be careful and cautious and take her time especially if she notices that she is rushing.  Wear good supportive shoe wear.  We will keep an eye on this for now.  But this does put her into a high risk category  Meds ordered this encounter  Medications  . escitalopram  (LEXAPRO) 10 MG tablet    Sig: Take 1 tablet (10 mg total) by mouth daily.    Dispense:  90 tablet    Refill:  1    Follow-up: Return in about 6 months (around 05/29/2021) for Mood.    Beatrice Lecher, MD

## 2020-11-26 NOTE — Assessment & Plan Note (Signed)
He is doing well overall continuing her work with her Chief Operating Officer.  Happy with her dose on the Lexapro will make sure that she has refills.  Follow-up in 6 months.

## 2021-06-10 ENCOUNTER — Other Ambulatory Visit: Payer: Self-pay | Admitting: Family Medicine

## 2021-06-10 DIAGNOSIS — F418 Other specified anxiety disorders: Secondary | ICD-10-CM

## 2021-07-09 ENCOUNTER — Other Ambulatory Visit: Payer: Self-pay | Admitting: Sports Medicine

## 2021-07-09 ENCOUNTER — Other Ambulatory Visit: Payer: Self-pay | Admitting: Family Medicine

## 2021-07-09 DIAGNOSIS — F418 Other specified anxiety disorders: Secondary | ICD-10-CM

## 2021-07-09 DIAGNOSIS — M51369 Other intervertebral disc degeneration, lumbar region without mention of lumbar back pain or lower extremity pain: Secondary | ICD-10-CM

## 2021-07-09 DIAGNOSIS — M5136 Other intervertebral disc degeneration, lumbar region: Secondary | ICD-10-CM

## 2021-07-09 NOTE — Telephone Encounter (Signed)
Please call pt and have her schedule a f/u appt for refills on Lexapro. Thanks. 30 day supply sent for now.

## 2021-07-09 NOTE — Telephone Encounter (Signed)
Appointment has been scheduled for 07/29/2021. AM

## 2021-07-29 ENCOUNTER — Other Ambulatory Visit: Payer: Self-pay

## 2021-07-29 ENCOUNTER — Ambulatory Visit: Payer: BC Managed Care – PPO | Admitting: Family Medicine

## 2021-07-29 ENCOUNTER — Encounter: Payer: Self-pay | Admitting: Family Medicine

## 2021-07-29 VITALS — BP 109/68 | HR 69

## 2021-07-29 DIAGNOSIS — F418 Other specified anxiety disorders: Secondary | ICD-10-CM

## 2021-07-29 DIAGNOSIS — M79672 Pain in left foot: Secondary | ICD-10-CM

## 2021-07-29 DIAGNOSIS — M810 Age-related osteoporosis without current pathological fracture: Secondary | ICD-10-CM | POA: Diagnosis not present

## 2021-07-29 NOTE — Assessment & Plan Note (Signed)
Continue current regimen.  F/U in 6 months. Continue working with Chief Operating Officer.

## 2021-07-29 NOTE — Progress Notes (Signed)
Established Patient Office Visit  Subjective:  Patient ID: Wanda Gonzales, female    DOB: March 10, 1956  Age: 65 y.o. MRN: [id number]  CC:  Chief Complaint  Patient presents with   mood    HPI  Wanda Gonzales presents for follow-up depression/anxiety. She is doing well and says she is still seeing her body coach. She exercising regularly with swimming, walking and virtual class several days per week.  Has been happy with her Lexapro she feels like overall it is effective and she has not had any significant side effects she says she feels more level on it. She is concerned bc that she has gained some weight.    Unfortunately she has been having some pain in her left foot over the bottom arch and into the heel.  She says that she knows that it is Planter fasciitis as she battled it for a long time in her opposite foot years ago.  She has been trying to do the stretches and wants to know if I have any other recommendations  Past Medical History:  Diagnosis Date   Abnormal Pap smear, atypical squamous cells of undetermined sign (ASC-US) 1998   treated with cryo, CIN I   ADD (attention deficit disorder) 02-23-12   easily distracted.   Arthritis 02-23-12   Rt. hip and lt. arm   Closed displaced fracture of fifth metatarsal bone of left foot 03/31/2017   Depression with anxiety    History of osteoporosis    Kidney calculi 07/2015   Kidney stone    PONV (postoperative nausea and vomiting) 02-23-12   always has PONV   Post-nasal drainage 02-23-12   frequent a problem,causes a cough and mucus buildup in throat   Urethral polyp 1998   Dr. Hartley Barefoot   Varicose veins    Venous insufficiency    chronic-denies any problems    Past Surgical History:  Procedure Laterality Date   blood clot removed Left 01/2012   blood clot removed from left hand    BREAST BIOPSY Right    BREAST SURGERY  02-23-12   Rt. needle biopsy, recent -negative  path showed fibroadenoma   COLONOSCOPY  08/2003   repeat 5 yrs.    COLONOSCOPY W/ BIOPSIES  12/2008   1 polyp recheck 5 yrs   EXCISION/RELEASE BURSA HIP  02/29/12   HAND SURGERY  02-23-12   "blood clot" excised palm left hand 01-18-12   KNEE ARTHROSCOPY W/ MENISCAL REPAIR  10/2012   KNEE ARTHROSCOPY WITH EXCISION BAKER'S CYST Left 10/2012   meniscal repair   ORIF METATARSAL FRACTURE Left 04/07/2017   Left 5th Metatarsal   SHOULDER SURGERY Right 02/2015   Dr. 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    Family History  Problem Relation Age of Onset   Breast cancer Mother 26       breast   Hyperlipidemia Mother    Brain cancer Father 71       brain   Osteoporosis Maternal Grandmother    Drug abuse Brother     Social History   Socioeconomic History   Marital status: Married    Spouse name: Not on file   Number of children: Not on file   Years of education: Not on file   Highest education level: Not on file  Occupational History   Not on file  Tobacco Use   Smoking status: Never  Smokeless tobacco: Never  Vaping Use   Vaping Use: Never used  Substance and Sexual Activity   Alcohol use: Yes    Alcohol/week: 1.0 standard drink    Types: 1 Glasses of wine per week    Comment: rare occ.   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   Social Determinants of Health   Financial Resource Strain: Not on file  Food Insecurity: Not on file  Transportation Needs: Not on file  Physical Activity: Not on file  Stress: Not on file  Social Connections: Not on file  Intimate Partner Violence: Not on file    Outpatient Medications Prior to Visit  Medication Sig Dispense Refill   acetaminophen (TYLENOL) 325 MG tablet Take 650 mg by mouth as needed.     alendronate (FOSAMAX) 70 MG tablet Take 70 mg  by mouth once a week.     escitalopram (LEXAPRO) 10 MG tablet Take 1 tablet (10 mg total) by mouth daily. 30 day supply sent. Please schedule follow up for refills. 30 tablet 0   meloxicam (MOBIC) 15 MG tablet ONE TAB IN THE MORNING WITH A MEAL FOR 2 WEEKS, THEN DAILY AS NEEDED PAIN. 30 tablet 3   Multiple Vitamin (MULTIVITAMIN) tablet Take 1 tablet by mouth daily.     TURMERIC PO Take by mouth daily.     VITAMIN D PO Take by mouth daily.     gabapentin (NEURONTIN) 300 MG capsule Take 1 capsule (300 mg total) by mouth at bedtime. 30 capsule 3   traMADol (ULTRAM) 50 MG tablet Take 1-2 tablets (50-100 mg total) by mouth every 8 (eight) hours as needed for moderate pain. Maximum 6 tabs per day. 21 tablet 0   No facility-administered medications prior to visit.    Allergies  Allergen Reactions   Codeine Other (See Comments)    REACTION: nausea and dizzy   Doxycycline Nausea Only   Concerta [Methylphenidate] Hives and Rash   Sulfa Antibiotics Nausea Only    ROS Review of Systems    Objective:    Physical Exam  BP 109/68   Pulse 69   LMP 12/18/2013   SpO2 98%  Wt Readings from Last 3 Encounters:  09/23/20 143 lb (64.9 kg)  07/29/20 137 lb (62.1 kg)  05/27/20 137 lb (62.1 kg)     Health Maintenance Due  Topic Date Due   Pneumococcal Vaccine 13-24 Years old (1 - PCV) Never done    There are no preventive care reminders to display for this patient.  Lab Results  Component Value Date   TSH 1.27 06/14/2018   Lab Results  Component Value Date   WBC 4.3 08/07/2020   HGB 13.9 08/07/2020   HCT 41.7 08/07/2020   MCV 90.7 08/07/2020   PLT 260 08/07/2020   Lab Results  Component Value Date   NA 142 08/07/2020   K 4.3 08/07/2020   CO2 29 08/07/2020   GLUCOSE 84 08/07/2020   BUN 20 08/07/2020   CREATININE 0.88 08/07/2020   BILITOT 0.6 08/07/2020   ALKPHOS 89 09/16/2019   AST 20 08/07/2020   ALT 12 08/07/2020   PROT 6.8 08/07/2020   ALBUMIN 4.4 09/16/2019   CALCIUM  9.3 08/07/2020   Lab Results  Component Value Date   CHOL 233 (H) 08/07/2020  Lab Results  Component Value Date   HDL 58 08/07/2020   Lab Results  Component Value Date   LDLCALC 156 (H) 08/07/2020   Lab Results  Component Value Date   TRIG 86 08/07/2020   Lab Results  Component Value Date   CHOLHDL 4.0 08/07/2020   Lab Results  Component Value Date   HGBA1C 4.9 12/19/2006      Assessment & Plan:   Problem List Items Addressed This Visit       Musculoskeletal and Integument   Osteoporosis    Bone density was 2 years ago.  Continue with Fosamax.        Other   Depression with anxiety - Primary    Continue current regimen.  F/U in 6 months. Continue working with Chief Operating Officer.        Other Visit Diagnoses     Left foot pain           Left foot pain likely plantar fasciitis-discussed stretches which she is already been doing, icing massage at night with a frozen water bottle.  Also discussed possibly using night braces to help keep the foot slightly extended.  She is tried inserts in the past and they really were not helpful.  No orders of the defined types were placed in this encounter.   Follow-up: Return in about 6 months (around 01/26/2022) for Mood and medication .    Beatrice Lecher, MD

## 2021-07-29 NOTE — Assessment & Plan Note (Signed)
Bone density was 2 years ago.  Continue with Fosamax.

## 2021-08-06 ENCOUNTER — Other Ambulatory Visit: Payer: Self-pay | Admitting: Family Medicine

## 2021-08-06 DIAGNOSIS — F418 Other specified anxiety disorders: Secondary | ICD-10-CM

## 2021-08-11 ENCOUNTER — Other Ambulatory Visit: Payer: Self-pay | Admitting: Family Medicine

## 2021-08-11 DIAGNOSIS — Z1231 Encounter for screening mammogram for malignant neoplasm of breast: Secondary | ICD-10-CM

## 2021-09-27 NOTE — Progress Notes (Signed)
66 y.o. G0P0 Married White or Caucasian Not Hispanic or Latino female here for annual exam.  No vaginal bleeding. Not sexually active.   No bowel or bladder c/o  She is having an endoscopy in Feb for acid reflux.    H/O osteoporosis, started on Fosamax in 11/21 with Endocrinology.  She is still working with a Chief Operating Officer on liking her body. It has been very helpful.   Patient's last menstrual period was 12/18/2013.          Sexually active: No.  The current method of family planning is post menopausal status.    Exercising: Yes.     Cardio 4x a week  Smoker:  no  Health Maintenance: Pap:   08-29-18 negative, HR HPV negative            05-14-15 negative, HR HPV negative              H/O CIN I in 1998 History of abnormal Pap:  yes MMG:  09/24/20 density C Bi-rads 1 neg, she will schedule.   BMD:   06-27-2019 osteoporosis of her forearm  Colonoscopy: 12-26-19 polyp, f/u in 5 years TDaP:  09/16/19  Gardasil: n/a   reports that she has never smoked. She has never used smokeless tobacco. She reports current alcohol use of about 1.0 standard drink per week. She reports that she does not use drugs. 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 Husband is retired.   Past Medical History:  Diagnosis Date   Abnormal Pap smear, atypical squamous cells of undetermined sign (ASC-US) 1998   treated with cryo, CIN I   ADD (attention deficit disorder) 02-23-12   easily distracted.   Arthritis 02-23-12   Rt. hip and lt. arm   Closed displaced fracture of fifth metatarsal bone of left foot 03/31/2017   Depression with anxiety    History of osteoporosis    Kidney calculi 07/2015   Kidney stone    PONV (postoperative nausea and vomiting) 02-23-12   always has PONV   Post-nasal drainage 02-23-12   frequent a problem,causes a cough and mucus buildup in throat   Urethral polyp 1998   Dr. Hartley Barefoot   Varicose veins    Venous insufficiency    chronic-denies any  problems    Past Surgical History:  Procedure Laterality Date   blood clot removed Left 01/2012   blood clot removed from left hand    BREAST BIOPSY Right    BREAST SURGERY  02-23-12   Rt. needle biopsy, recent -negative  path showed fibroadenoma   COLONOSCOPY  08/2003   repeat 5 yrs.   COLONOSCOPY W/ BIOPSIES  12/2008   1 polyp recheck 5 yrs   EXCISION/RELEASE BURSA HIP  02/29/12   HAND SURGERY  02-23-12   "blood clot" excised palm left hand 01-18-12   KNEE ARTHROSCOPY W/ MENISCAL REPAIR  10/2012   KNEE ARTHROSCOPY WITH EXCISION BAKER'S CYST Left 10/2012   meniscal repair   ORIF METATARSAL FRACTURE Left 04/07/2017   Left 5th Metatarsal   SHOULDER SURGERY Right 02/2015   Dr. 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     alendronate (FOSAMAX) 70 MG tablet Take 70 mg by mouth once a  week.     escitalopram (LEXAPRO) 10 MG tablet Take 1 tablet (10 mg total) by mouth daily. 90 tablet 1   meloxicam (MOBIC) 15 MG tablet ONE TAB IN THE MORNING WITH A MEAL FOR 2 WEEKS, THEN DAILY AS NEEDED PAIN. 30 tablet 3   Multiple Vitamin (MULTIVITAMIN) tablet Take 1 tablet by mouth daily.     omeprazole (PRILOSEC) 40 MG capsule Take 40 mg by mouth daily.     VITAMIN D PO Take by mouth daily.     TURMERIC PO Take by mouth daily. (Patient not taking: Reported on 09/30/2021)     No current facility-administered medications for this visit.    Family History  Problem Relation Age of Onset   Breast cancer Mother 61       breast   Hyperlipidemia Mother    Brain cancer Father 58       brain   Osteoporosis Maternal Grandmother    Drug abuse Brother     Review of Systems  Exam:   BP 110/62    Pulse 78    Ht 5' 1.5" (1.562 m)    LMP 12/18/2013    SpO2 99%    BMI 26.58 kg/m   Weight change:  @WEIGHTCHANGE @ Height:   Height: 5' 1.5" (156.2 cm)  Ht Readings from Last 3 Encounters:  09/30/21 5' 1.5" (1.562 m)  11/26/20 5\' 1"  (1.549 m)  09/23/20 5' (1.524 m)    General appearance: alert, cooperative and appears stated age Head: Normocephalic, without obvious abnormality, atraumatic Neck: no adenopathy, supple, symmetrical, trachea midline and thyroid normal to inspection and palpation Lungs: clear to auscultation bilaterally Cardiovascular: regular rate and rhythm Breasts: normal appearance, no masses or tenderness Abdomen: soft, non-tender; non distended,  no masses,  no organomegaly Extremities: extremities normal, atraumatic, no cyanosis or edema Skin: Skin color, texture, turgor normal. No rashes or lesions Lymph nodes: Cervical, supraclavicular, and axillary nodes normal. No abnormal inguinal nodes palpated Neurologic: Grossly normal   Pelvic: External genitalia:  no lesions              Urethra:  normal appearing urethra with no masses, tenderness or lesions              Bartholins and Skenes: normal                 Vagina: normal appearing vagina with normal color and discharge, no lesions              Cervix: no lesions               Bimanual Exam:  Uterus:  normal size, contour, position, consistency, mobility, non-tender              Adnexa: no mass, fullness, tenderness               Rectovaginal: Confirms               Anus:  normal sphincter tone, no lesions  Gae Dry chaperoned for the exam.  1. Well woman exam Discussed breast self exam Discussed calcium and vit D intake She will set up her mammogram Colonoscopy UTD No pap this year Labs with primary  2. History of osteoporosis Managed by Endocrinology, they will set up her next DEXA. They are managing her medication.

## 2021-09-29 ENCOUNTER — Ambulatory Visit: Payer: BC Managed Care – PPO

## 2021-09-30 ENCOUNTER — Encounter: Payer: Self-pay | Admitting: Obstetrics and Gynecology

## 2021-09-30 ENCOUNTER — Ambulatory Visit (INDEPENDENT_AMBULATORY_CARE_PROVIDER_SITE_OTHER): Payer: BC Managed Care – PPO | Admitting: Obstetrics and Gynecology

## 2021-09-30 ENCOUNTER — Other Ambulatory Visit: Payer: Self-pay

## 2021-09-30 VITALS — BP 110/62 | HR 78 | Ht 61.5 in

## 2021-09-30 DIAGNOSIS — Z8739 Personal history of other diseases of the musculoskeletal system and connective tissue: Secondary | ICD-10-CM

## 2021-09-30 DIAGNOSIS — Z01419 Encounter for gynecological examination (general) (routine) without abnormal findings: Secondary | ICD-10-CM

## 2021-09-30 NOTE — Patient Instructions (Signed)

## 2021-10-06 ENCOUNTER — Other Ambulatory Visit: Payer: Self-pay

## 2021-10-06 ENCOUNTER — Ambulatory Visit (INDEPENDENT_AMBULATORY_CARE_PROVIDER_SITE_OTHER): Payer: BC Managed Care – PPO

## 2021-10-06 DIAGNOSIS — Z1231 Encounter for screening mammogram for malignant neoplasm of breast: Secondary | ICD-10-CM

## 2021-10-06 NOTE — Progress Notes (Signed)
Please call patient. Normal mammogram.  Repeat in 1 year.  

## 2021-10-29 DIAGNOSIS — K219 Gastro-esophageal reflux disease without esophagitis: Secondary | ICD-10-CM | POA: Diagnosis not present

## 2021-10-29 DIAGNOSIS — K2100 Gastro-esophageal reflux disease with esophagitis, without bleeding: Secondary | ICD-10-CM | POA: Diagnosis not present

## 2021-10-29 DIAGNOSIS — D130 Benign neoplasm of esophagus: Secondary | ICD-10-CM | POA: Diagnosis not present

## 2021-10-29 DIAGNOSIS — K222 Esophageal obstruction: Secondary | ICD-10-CM | POA: Diagnosis not present

## 2021-10-29 DIAGNOSIS — K449 Diaphragmatic hernia without obstruction or gangrene: Secondary | ICD-10-CM | POA: Diagnosis not present

## 2021-10-29 DIAGNOSIS — D132 Benign neoplasm of duodenum: Secondary | ICD-10-CM | POA: Diagnosis not present

## 2021-11-17 DIAGNOSIS — H5203 Hypermetropia, bilateral: Secondary | ICD-10-CM | POA: Diagnosis not present

## 2021-12-09 DIAGNOSIS — H524 Presbyopia: Secondary | ICD-10-CM | POA: Diagnosis not present

## 2022-01-26 ENCOUNTER — Ambulatory Visit: Payer: BC Managed Care – PPO | Admitting: Family Medicine

## 2022-02-03 DIAGNOSIS — Z96652 Presence of left artificial knee joint: Secondary | ICD-10-CM | POA: Diagnosis not present

## 2022-02-03 DIAGNOSIS — M7062 Trochanteric bursitis, left hip: Secondary | ICD-10-CM | POA: Diagnosis not present

## 2022-02-03 DIAGNOSIS — Z96642 Presence of left artificial hip joint: Secondary | ICD-10-CM | POA: Diagnosis not present

## 2022-02-06 DIAGNOSIS — M7062 Trochanteric bursitis, left hip: Secondary | ICD-10-CM | POA: Insufficient documentation

## 2022-02-10 ENCOUNTER — Encounter: Payer: Self-pay | Admitting: Family Medicine

## 2022-02-10 ENCOUNTER — Ambulatory Visit (INDEPENDENT_AMBULATORY_CARE_PROVIDER_SITE_OTHER): Payer: Medicare Other | Admitting: Family Medicine

## 2022-02-10 VITALS — BP 99/53 | HR 79 | Ht 61.0 in

## 2022-02-10 DIAGNOSIS — E559 Vitamin D deficiency, unspecified: Secondary | ICD-10-CM | POA: Diagnosis not present

## 2022-02-10 DIAGNOSIS — Z23 Encounter for immunization: Secondary | ICD-10-CM

## 2022-02-10 DIAGNOSIS — M810 Age-related osteoporosis without current pathological fracture: Secondary | ICD-10-CM

## 2022-02-10 DIAGNOSIS — E785 Hyperlipidemia, unspecified: Secondary | ICD-10-CM | POA: Diagnosis not present

## 2022-02-10 DIAGNOSIS — Z974 Presence of external hearing-aid: Secondary | ICD-10-CM

## 2022-02-10 DIAGNOSIS — F418 Other specified anxiety disorders: Secondary | ICD-10-CM

## 2022-02-10 NOTE — Assessment & Plan Note (Signed)
Unfortunately she has had increased pain and tenderness medially and laterally on that left knee where she has had a knee replacement.  She has seen the orthopedist and they are actually getting ready to do a bone scan.  She is also been having a lot of pain over her posterior left hip she is also had a hip replacement there about 20 years ago.  They did do an injection and that has been helpful.

## 2022-02-10 NOTE — Assessment & Plan Note (Signed)
reckeck today.  She is taking a supplement.

## 2022-02-10 NOTE — Assessment & Plan Note (Signed)
To recheck lipids.  Last checked in 2021.  She will return so she can fast.

## 2022-02-10 NOTE — Patient Instructions (Signed)
Please return for labs in the next couple of weeks fasting.

## 2022-02-10 NOTE — Assessment & Plan Note (Signed)
Is overall doing well.  Okay to take an extra tab if needed.  We also discussed the possibility of going up to 20 mg every day.  She will think about it and let me know.

## 2022-02-10 NOTE — Assessment & Plan Note (Signed)
Taking bisphosphonate and Calcium

## 2022-02-10 NOTE — Progress Notes (Signed)
Established Patient Office Visit  Subjective   Patient ID: Wanda Gonzales, female    DOB: 23-Jan-1956  Age: 66 y.o. MRN: [id number]  Chief Complaint  Patient presents with   Follow-up    HPI  F/U Depression and Anxiety  -37-monthfollow-up today.  She is currently on Lexapro 10 mg.  She is tolerating the medication well without any side effects or problems.  She wanted to know if she could occasionally take 2.  [place] took 2 tabs daily and felt like it really was helpful.  He is still seeing her body coach, DJackelyn Polingand in [place]  She says it was really a great experience.  He also now has bilateral hearing aids that she got about 2 months ago and has been really happy with them.  He still teaches art 2 days/week.  He has been struggling to exercise because of left hip and left knee pain.  She is seeing the orthopedist about this she has had a left hip and left knee replacement already.    ROS    Objective:     BP (!) 99/53   Pulse 79   Ht '5\' 1"'$  (1.549 m)   LMP 12/18/2013   SpO2 99%   BMI 27.02 kg/m    Physical Exam Vitals and nursing note reviewed.  Constitutional:      Appearance: She is well-developed.  HENT:     Head: Normocephalic and atraumatic.  Cardiovascular:     Rate and Rhythm: Normal rate and regular rhythm.     Heart sounds: Normal heart sounds.  Pulmonary:     Effort: Pulmonary effort is normal.     Breath sounds: Normal breath sounds.  Skin:    General: Skin is warm and dry.  Neurological:     Mental Status: She is alert and oriented to person, place, and time.  Psychiatric:        Behavior: Behavior normal.    No results found for any visits on 02/10/22.    The 10-year ASCVD risk score (Arnett DK, et al., 2019) is: 3.6%    Assessment & Plan:   Problem List Items Addressed This Visit       Musculoskeletal and  Integument   Osteoporosis    Taking bisphosphonate and Calcium          Other   Vitamin D deficiency    reckeck today.  She is taking a supplement.       Relevant Orders   VITAMIN D 25 Hydroxy (Vit-D Deficiency, Fractures)   Hyperlipidemia    To recheck lipids.  Last checked in 2021.  She will return so she can fast.       Relevant Orders   Lipid Panel w/reflex Direct LDL   COMPLETE METABOLIC PANEL WITH GFR   CBC   Hearing aid worn   Depression with anxiety - Primary    Is overall doing well.  Okay to take an extra tab if needed.  We also discussed the possibility of going up to 20 mg every day.  She will think about it and let me know.       Other Visit Diagnoses     Need for pneumococcal 20-valent conjugate vaccination       Relevant Orders   Pneumococcal conjugate vaccine 20-valent (Prevnar 20) (Completed)  Given Prenvar 20 today.    Return in about 6 months (around 08/13/2022) for Mood.    Beatrice Lecher, MD

## 2022-02-15 DIAGNOSIS — E785 Hyperlipidemia, unspecified: Secondary | ICD-10-CM | POA: Diagnosis not present

## 2022-02-15 DIAGNOSIS — E559 Vitamin D deficiency, unspecified: Secondary | ICD-10-CM | POA: Diagnosis not present

## 2022-02-16 ENCOUNTER — Other Ambulatory Visit: Payer: Self-pay | Admitting: Orthopedic Surgery

## 2022-02-16 ENCOUNTER — Other Ambulatory Visit (HOSPITAL_COMMUNITY): Payer: Self-pay | Admitting: Orthopedic Surgery

## 2022-02-16 DIAGNOSIS — Z96652 Presence of left artificial knee joint: Secondary | ICD-10-CM

## 2022-02-16 LAB — VITAMIN D 25 HYDROXY (VIT D DEFICIENCY, FRACTURES): Vit D, 25-Hydroxy: 45 ng/mL (ref 30–100)

## 2022-02-16 LAB — COMPLETE METABOLIC PANEL WITH GFR
AG Ratio: 1.7 (calc) (ref 1.0–2.5)
ALT: 11 U/L (ref 6–29)
AST: 17 U/L (ref 10–35)
Albumin: 4.3 g/dL (ref 3.6–5.1)
Alkaline phosphatase (APISO): 68 U/L (ref 37–153)
BUN: 24 mg/dL (ref 7–25)
CO2: 24 mmol/L (ref 20–32)
Calcium: 9.4 mg/dL (ref 8.6–10.4)
Chloride: 105 mmol/L (ref 98–110)
Creat: 0.91 mg/dL (ref 0.50–1.05)
Globulin: 2.6 g/dL (calc) (ref 1.9–3.7)
Glucose, Bld: 82 mg/dL (ref 65–99)
Potassium: 4.7 mmol/L (ref 3.5–5.3)
Sodium: 140 mmol/L (ref 135–146)
Total Bilirubin: 0.7 mg/dL (ref 0.2–1.2)
Total Protein: 6.9 g/dL (ref 6.1–8.1)
eGFR: 70 mL/min/{1.73_m2} (ref >=60)

## 2022-02-16 LAB — LIPID PANEL W/REFLEX DIRECT LDL
Cholesterol: 266 mg/dL — ABNORMAL HIGH (ref <200)
HDL: 59 mg/dL (ref >=50)
LDL Cholesterol (Calc): 186 mg/dL (calc) — ABNORMAL HIGH
Non-HDL Cholesterol (Calc): 207 mg/dL (calc) — ABNORMAL HIGH (ref <130)
Total CHOL/HDL Ratio: 4.5 (calc) (ref <5.0)
Triglycerides: 94 mg/dL (ref <150)

## 2022-02-16 LAB — CBC
HCT: 43.1 % (ref 35.0–45.0)
Hemoglobin: 14.3 g/dL (ref 11.7–15.5)
MCH: 29.8 pg (ref 27.0–33.0)
MCHC: 33.2 g/dL (ref 32.0–36.0)
MCV: 89.8 fL (ref 80.0–100.0)
MPV: 10.4 fL (ref 7.5–12.5)
Platelets: 279 10*3/uL (ref 140–400)
RBC: 4.8 10*6/uL (ref 3.80–5.10)
RDW: 12.5 % (ref 11.0–15.0)
WBC: 7.1 10*3/uL (ref 3.8–10.8)

## 2022-02-16 NOTE — Progress Notes (Signed)
Hi Wanda Gonzales, LDL cholesterol jumped up to 186, it was 156 last year.  Continue to work on food choices that are good for high cholesterol.  There is some great information on the American heart Association website for cholesterol.  Your blood count and metabolic panel look great.  Vitamin D is normal.  The 10-year ASCVD risk score (Arnett DK, et al., 2019) is: 3.9%   Values used to calculate the score:     Age: 66 years     Sex: Female     Is Non-Hispanic African American: No     Diabetic: No     Tobacco smoker: No     Systolic Blood Pressure: 99 mmHg     Is BP treated: No     HDL Cholesterol: 59 mg/dL     Total Cholesterol: 266 mg/dL

## 2022-02-22 DIAGNOSIS — L720 Epidermal cyst: Secondary | ICD-10-CM | POA: Diagnosis not present

## 2022-02-22 DIAGNOSIS — Z129 Encounter for screening for malignant neoplasm, site unspecified: Secondary | ICD-10-CM | POA: Diagnosis not present

## 2022-02-22 DIAGNOSIS — L821 Other seborrheic keratosis: Secondary | ICD-10-CM | POA: Diagnosis not present

## 2022-02-25 ENCOUNTER — Encounter (HOSPITAL_COMMUNITY)
Admission: RE | Admit: 2022-02-25 | Discharge: 2022-02-25 | Disposition: A | Discharge status: Home / Self Care | Payer: Medicare Other | Source: Ambulatory Visit | Attending: Orthopedic Surgery | Admitting: Orthopedic Surgery

## 2022-02-25 DIAGNOSIS — Z96652 Presence of left artificial knee joint: Secondary | ICD-10-CM | POA: Insufficient documentation

## 2022-02-25 DIAGNOSIS — M25562 Pain in left knee: Secondary | ICD-10-CM | POA: Diagnosis not present

## 2022-02-25 DIAGNOSIS — M7989 Other specified soft tissue disorders: Secondary | ICD-10-CM | POA: Diagnosis not present

## 2022-02-25 DIAGNOSIS — M1711 Unilateral primary osteoarthritis, right knee: Secondary | ICD-10-CM | POA: Diagnosis not present

## 2022-02-25 MED ORDER — TECHNETIUM TC 99M MEDRONATE IV KIT
21.0000 | PACK | Freq: Once | INTRAVENOUS | Status: AC
  Administered 2022-02-25: 21 via INTRAVENOUS

## 2022-03-03 DIAGNOSIS — Z96652 Presence of left artificial knee joint: Secondary | ICD-10-CM | POA: Diagnosis not present

## 2022-03-03 DIAGNOSIS — Z96642 Presence of left artificial hip joint: Secondary | ICD-10-CM | POA: Diagnosis not present

## 2022-03-03 DIAGNOSIS — M7062 Trochanteric bursitis, left hip: Secondary | ICD-10-CM | POA: Diagnosis not present

## 2022-03-08 DIAGNOSIS — L720 Epidermal cyst: Secondary | ICD-10-CM | POA: Diagnosis not present

## 2022-05-24 DIAGNOSIS — Z96652 Presence of left artificial knee joint: Secondary | ICD-10-CM | POA: Diagnosis not present

## 2022-05-24 DIAGNOSIS — M65862 Other synovitis and tenosynovitis, left lower leg: Secondary | ICD-10-CM | POA: Diagnosis not present

## 2022-05-24 DIAGNOSIS — M67862 Other specified disorders of synovium, left knee: Secondary | ICD-10-CM | POA: Diagnosis not present

## 2022-06-22 ENCOUNTER — Encounter: Payer: Medicare Other | Admitting: Family Medicine

## 2022-06-29 DIAGNOSIS — R197 Diarrhea, unspecified: Secondary | ICD-10-CM | POA: Diagnosis not present

## 2022-06-30 DIAGNOSIS — R197 Diarrhea, unspecified: Secondary | ICD-10-CM | POA: Diagnosis not present

## 2022-07-05 ENCOUNTER — Ambulatory Visit (INDEPENDENT_AMBULATORY_CARE_PROVIDER_SITE_OTHER): Payer: Medicare Other | Admitting: Family Medicine

## 2022-07-05 ENCOUNTER — Encounter: Payer: Self-pay | Admitting: Family Medicine

## 2022-07-05 VITALS — BP 125/80 | HR 74 | Ht 61.0 in | Wt 144.0 lb

## 2022-07-05 DIAGNOSIS — Z0000 Encounter for general adult medical examination without abnormal findings: Secondary | ICD-10-CM

## 2022-07-05 NOTE — Patient Instructions (Signed)
  Ms. Whitfill , Thank you for taking time to come for your Medicare Wellness Visit. I appreciate your ongoing commitment to your health goals. Please review the following plan we discussed and let me know if I can assist you in the future.   These are the goals we discussed:  Goals      Exercise 150 min/wk Moderate Activity     Keep up the swimming and cardio!!! That is wonderful!!!          This is a list of the screening recommended for you and due dates:  Health Maintenance  Topic Date Due   COVID-19 Vaccine (6 - Pfizer risk series) 08/27/2021   Flu Shot  04/19/2022   Pap Smear  08/30/2023   Mammogram  10/07/2023   Colon Cancer Screening  12/25/2024   Tetanus Vaccine  09/15/2029   Pneumonia Vaccine  Completed   DEXA scan (bone density measurement)  Completed   Hepatitis C Screening: USPSTF Recommendation to screen - Ages 39-79 yo.  Completed   HIV Screening  Completed   Zoster (Shingles) Vaccine  Completed   HPV Vaccine  Aged Out

## 2022-07-05 NOTE — Progress Notes (Signed)
Subjective:    Wanda Gonzales is a 66 y.o. female who presents for a Welcome to Medicare exam.   She occasionally experiences headaches but when she does they are quite intense.  They normally seem to be focused around her eyes and her nose almost like sinus pressure.  They usually last for hours sometimes about enough she would have to lay down.  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  Sometimes will have some pressure in the back of the head as well.  She will sometimes take an Aleve and it helps some and just does not completely get rid of it.  Review of Systems Neg ROS.         Objective:    Today's Vitals   07/05/22 1646  BP: 125/80  Pulse: 74  SpO2: 98%  Weight: 144 lb (65.3 kg)  Height: '5\' 1"'$  (1.549 m)  Body mass index is 27.21 kg/m.  Medications Outpatient Encounter Medications as of 07/05/2022  Medication Sig   acetaminophen (TYLENOL) 325 MG tablet Take 650 mg by mouth as needed.   alendronate (FOSAMAX) 70 MG tablet Take 70 mg by mouth once a week.   escitalopram (LEXAPRO) 10 MG tablet Take 1 tablet (10 mg total) by mouth daily.   hyoscyamine (LEVBID) 0.375 MG 12 hr tablet Take 0.375 mg by mouth daily.   meloxicam (MOBIC) 15 MG tablet ONE TAB IN THE MORNING WITH A MEAL FOR 2 WEEKS, THEN DAILY AS NEEDED PAIN.   Multiple Vitamin (MULTIVITAMIN) tablet Take 1 tablet by mouth daily.   omeprazole (PRILOSEC) 40 MG capsule Take 40 mg by mouth daily.   VITAMIN D PO Take by mouth daily.   No facility-administered encounter medications on file as of 07/05/2022.     History: Past Medical History:  Diagnosis Date   Abnormal Pap smear, atypical squamous cells of undetermined sign (ASC-US) 1998   treated with cryo, CIN I   ADD (attention deficit disorder) 02-23-12   easily distracted.   Arthritis 02-23-12   Rt. hip and lt. arm   Closed displaced fracture of fifth metatarsal bone of left foot 03/31/2017   Depression  with anxiety    History of osteoporosis    Kidney calculi 07/2015   Kidney stone    PONV (postoperative nausea and vomiting) 02-23-12   always has PONV   Post-nasal drainage 02-23-12   frequent a problem,causes a cough and mucus buildup in throat   Urethral polyp 1998   Dr. Hartley Barefoot   Varicose veins    Venous insufficiency    chronic-denies any problems   Past Surgical History:  Procedure Laterality Date   blood clot removed Left 01/2012   blood clot removed from left hand    BREAST BIOPSY Right 02/14/2012   Fibroadenoma   COLONOSCOPY  08/2003   repeat 5 yrs.   COLONOSCOPY W/ BIOPSIES  12/2008   1 polyp recheck 5 yrs   EXCISION/RELEASE BURSA HIP  02/29/2012   HAND SURGERY  02/23/2012   "blood clot" excised palm left hand 01-18-12   KNEE ARTHROSCOPY W/ MENISCAL REPAIR  10/2012   KNEE ARTHROSCOPY WITH EXCISION BAKER'S CYST Left 10/2012   meniscal repair   ORIF METATARSAL FRACTURE Left 04/07/2017   Left 5th Metatarsal   SHOULDER SURGERY Right 02/2015   Dr. 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   Drug use: No   Sexual activity: Not Currently    Birth control/protection: Post-menopausal    Tobacco Counseling Counseling given: Not Answered   Immunizations and Health Maintenance Immunization History  Administered Date(s) Administered   Influenza Inj Mdck Quad Pf  07/02/2021   Influenza Whole 07/01/2009   Influenza, Seasonal, Injecte, Preservative Fre 08/15/2012, 08/21/2013   Influenza,inj,Quad PF,6+ Mos 06/01/2015, 07/18/2017, 08/24/2018, 05/27/2020   Influenza-Unspecified 08/15/2012, 08/21/2013, 06/01/2015, 05/02/2016, 07/18/2017, 08/24/2018   PFIZER Comirnaty(Gray Top)Covid-19 Tri-Sucrose Vaccine 02/19/2021   PFIZER(Purple Top)SARS-COV-2 Vaccination 11/16/2019, 12/03/2019, 07/07/2020   PNEUMOCOCCAL CONJUGATE-20 02/10/2022   PPD Test 05/29/2013, 06/03/2013, 10/18/2016   Pfizer Covid-19 Vaccine Bivalent Booster 20yr & up 07/02/2021   Td 09/19/2005, 01/24/2009   Tdap 09/16/2019   Zoster Recombinat (Shingrix) 10/05/2018, 01/06/2019   Health Maintenance Due  Topic Date Due   COVID-19 Vaccine (6 - Pfizer risk series) 08/27/2021   INFLUENZA VACCINE  04/19/2022    Activities of Daily Living    07/05/2022    4:04 PM  In your present state of health, do you have any difficulty performing the following activities:  Hearing? 1  Comment wears hearing aids  Vision? 0  Difficulty concentrating or making decisions? 0  Walking or climbing stairs? 1  Dressing or bathing? 0  Preparing Food and eating ? N  Using the Toilet? N  In the past six months, have you accidently leaked urine? Y  Do you have problems with loss of bowel control? N  Managing your Medications? N  Managing your Finances? N  Housekeeping or managing your Housekeeping? N    Physical Exam  Physical Exam Vitals and nursing note reviewed.  Constitutional:      Appearance: She is well-developed.  HENT:     Head: Normocephalic and atraumatic.  Cardiovascular:     Rate and Rhythm: Normal rate and regular rhythm.     Heart sounds: Normal heart sounds.  Pulmonary:     Effort: Pulmonary effort is normal.     Breath sounds: Normal breath sounds.  Skin:    General: Skin is warm and dry.  Neurological:     Mental Status: She is alert and oriented to person, place, and time.   Psychiatric:        Behavior: Behavior normal.    (optional), or other factors deemed appropriate based on the beneficiary's medical and social history and current clinical standards.  Advanced Directives:      Assessment:    This is a routine wellness examination for this patient .   Vision/Hearing screen No results found.  She wears hearing aids but does not have them with her today.  Dietary issues and exercise activities discussed:  Current Exercise Habits: Structured exercise class, Type of exercise: walking (swim, cardio  online), Intensity: Moderate   Goals      Exercise 150 min/wk Moderate Activity     Keep up the swimming and cardio!!! That is wonderful!!!        Depression Screen    07/05/2022    4:43 PM 07/05/2022    3:40 PM 02/10/2022    9:58 AM 11/26/2020    3:23 PM  PHQ 2/9 Scores  PHQ - 2 Score 0 0 0 0  PHQ- 9 Score   2 1     Fall Risk    07/05/2022    4:45 PM  [place] in the past year? 0  Number falls in past yr: 0  Injury with Fall? 0    Cognitive Function:        07/05/2022    4:45 PM 07/05/2022    4:42 PM  6CIT Screen  What Year? 0 points 0 points  What month? 0 points 0 points  What time? 0 points 0 points  Count back from 20 0 points 0 points  Months in reverse 0 points 0 points  Repeat phrase 2 points 2 points  Total Score 2 points 2 points    Patient Care Team: Hali Marry, MD as PCP - General     Plan:   Medicare Wellness, Welcome   I have personally reviewed and noted the following in the patient's chart:   Medical and social history Use of alcohol, tobacco or illicit drugs  Current medications and supplements Functional ability and status Nutritional status Physical activity Advanced directives List of other physicians Hospitalizations, surgeries, and ER visits in previous 12 months Vitals Screenings to include cognitive, depression, and falls Referrals and appointments  EKG today shows  rate of 72 bpm, normal sinus rhythm with no acute ST-T wave changes.  She does have borderline low voltage QRS.  Unchanged from EKG from 2019.  In addition, I have reviewed and discussed with patient certain preventive protocols, quality metrics, and best practice recommendations. A written personalized care plan for preventive services as well as general preventive health recommendations were provided to patient.     Beatrice Lecher, MD 07/05/2022

## 2022-07-07 ENCOUNTER — Encounter: Payer: Medicare Other | Admitting: Family Medicine

## 2022-07-14 ENCOUNTER — Telehealth: Payer: Medicare Other | Admitting: Physician Assistant

## 2022-07-14 ENCOUNTER — Telehealth: Payer: Medicare Other | Admitting: Nurse Practitioner

## 2022-07-14 DIAGNOSIS — U071 COVID-19: Secondary | ICD-10-CM

## 2022-07-14 NOTE — Progress Notes (Signed)
   Thank you for the details you included in the comment boxes. Those details are very helpful in determining the best course of treatment for you and help [place] to provide the best care.Because you are Covid 19 positive and a candidate for antiviral treatment, we recommend that you convert this visit to a video visit in order for the provider to better assess what is going on.  The provider will be able to give you a more accurate diagnosis and treatment plan if we can more freely discuss your symptoms and with the addition of a virtual examination.   If you convert to a video visit, we will bill your insurance (similar to an office visit) and you will not be charged for this e-Visit. You will be able to stay at home and speak with the first available [facility] advanced practice provider. The link to do a video visit is in the drop down Menu tab of your Welcome screen in [place].  I have spent 5 minutes in review of e-visit questionnaire, review and updating patient chart, medical decision making and response to patient.   Mar Daring, PA-C

## 2022-07-14 NOTE — Progress Notes (Signed)
Virtual Visit Consent   Wanda Gonzales, you are scheduled for a virtual visit with Wanda Daphine Deutscher, FNP, a [facility] provider, today.     Just as with appointments in the office, your consent must be obtained to participate.  Your consent will be active for this visit and any virtual visit you may have with one of our providers in the next 365 days.     If you have a MyChart account, a copy of this consent can be sent to you electronically.  All virtual visits are billed to your insurance company just like a traditional visit in the office.    As this is a virtual visit, video technology does not allow for your provider to perform a traditional examination.  This may limit your provider's ability to fully assess your condition.  If your provider identifies any concerns that need to be evaluated in person or the need to arrange testing (such as labs, EKG, etc.), we will make arrangements to do so.     Although advances in technology are sophisticated, we cannot ensure that it will always work on either your end or our end.  If the connection with a video visit is poor, the visit may have to be switched to a telephone visit.  With either a video or telephone visit, we are not always able to ensure that we have a secure connection.     I need to obtain your verbal consent now.   Are you willing to proceed with your visit today? YES   Julia L Andreasen has provided verbal consent on 07/14/2022 for a virtual visit (video or telephone).   Wanda Daphine Deutscher, FNP   Date: 07/14/2022 5:41 PM   Virtual Visit via Video Note   I, Wanda Johann Santone, connected with Wanda Gonzales ([id number], 1956-03-23) on 07/14/22 at  6:00 PM EDT by a video-enabled telemedicine application and verified that I am speaking with the correct person using two identifiers.  Location: Patient: Virtual Visit Location Patient: Home Provider: Virtual Visit Location Provider: Mobile   I discussed the limitations of  evaluation and management by telemedicine and the availability of in person appointments. The patient expressed understanding and agreed to proceed.    History of Present Illness: Wanda Gonzales is a 66 y.o. who identifies as a female who was assigned female at birth, and is being seen today for covid positive.  HPI: Covid positive  URI  This is a new problem. The current episode started in the past 7 days (last tuesday). The problem has been gradually improving. There has been no fever. Associated symptoms include congestion, coughing, headaches, rhinorrhea and a sore throat (almost gone now). Pertinent negatives include no sinus pain or sneezing. She has tried acetaminophen for the symptoms. The treatment provided mild relief.    Review of Systems  HENT:  Positive for congestion, rhinorrhea and sore throat (almost gone now). Negative for sinus pain and sneezing.   Respiratory:  Positive for cough.   Neurological:  Positive for headaches.    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   Benign paroxysmal  positional vertigo 08/25/2016   Mid back pain, chronic 12/16/2015   Cervical strain 11/21/2014   Right shoulder pain 05/15/2014   DDD (degenerative disc disease), lumbar 02/17/2014   Vitamin D deficiency 03/18/2013   Trochanteric bursitis of right hip 02/29/2012   INSOMNIA 04/13/2010   Hyperlipidemia 09/17/2007   CERVICAL POLYP 09/17/2007   Attention deficit disorder 09/11/2007   UNSPECIFIED VENOUS INSUFFICIENCY 08/29/2007    Allergies:  Allergies  Allergen Reactions   Codeine Other (See  Comments)    REACTION: nausea and dizzy   Doxycycline Nausea Only   Concerta [Methylphenidate] Hives and Rash   Sulfa Antibiotics Nausea Only   Medications:  Current Outpatient Medications:    acetaminophen (TYLENOL) 325 MG tablet, Take 650 mg by mouth as needed., Disp: , Rfl:    alendronate (FOSAMAX) 70 MG tablet, Take 70 mg by mouth once a week., Disp: , Rfl:    escitalopram (LEXAPRO) 10 MG tablet, Take 1 tablet (10 mg total) by mouth daily., Disp: 90 tablet, Rfl: 1   hyoscyamine (LEVBID) 0.375 MG 12 hr tablet, Take 0.375 mg by mouth daily., Disp: , Rfl:    meloxicam (MOBIC) 15 MG tablet, ONE TAB IN THE MORNING WITH A MEAL FOR 2 WEEKS, THEN DAILY AS NEEDED PAIN., Disp: 30 tablet, Rfl: 3   Multiple Vitamin (MULTIVITAMIN) tablet, Take 1 tablet by mouth daily., Disp: , Rfl:    omeprazole (PRILOSEC) 40 MG capsule, Take 40 mg by mouth daily., Disp: , Rfl:    VITAMIN D PO, Take by mouth daily., Disp: , Rfl:   Observations/Objective: Patient is well-developed, well-nourished in no acute distress.  Resting comfortably  at home.  Head is normocephalic, atraumatic.  No labored breathing.  Speech is clear and coherent with logical content.  Patient is alert and oriented at baseline.  No cough noted  Assessment and Plan:  Wanda Gonzales in today with chief complaint of Covid Positive   1. Positive self-administered antigen test for COVID-19 Since patient symptoms have started to improve and se doe sot have any heart or lung issues. She has decided to just let it resolve on its. Own. If happens to feel worse tomorrow- ley=t [place] know and we will call in paxlovid for her. If develop SOB - go to the ED Other wise treat symptoms: 1. Take meds as prescribed 2. Use a cool mist humidifier especially during the winter months and when heat has been humid. 3. Use saline nose sprays frequently 4. Saline irrigations of the nose can be very helpful if done frequently.  * 4X daily for 1 week*  * Use of  a nettie pot can be helpful with this. Follow directions with this* 5. Drink plenty of fluids 6. Keep thermostat turn down low 7.For any cough or congestion- delsym or mucinex OTC 8. For fever or aces or pains- take tylenol or ibuprofen appropriate for age and weight.  * for fevers greater than 101 orally you may alternate ibuprofen and tylenol every  3 hours.       Follow Up Instructions: I discussed the assessment and treatment plan with the patient. The patient was provided an opportunity to ask questions and all were answered. The patient agreed with the plan and demonstrated an understanding of the instructions.  A copy of instructions were sent to the patient via MyChart.  The patient was advised to call back or seek an in-person evaluation if the symptoms worsen or if the condition fails to improve as anticipated.  Time:  I spent 11 minutes with the patient via telehealth technology discussing the above problems/concerns.    Wanda Daphine Deutscher, FNP

## 2022-07-14 NOTE — Patient Instructions (Signed)
  Wanda Gonzales, thank you for joining Chevis Pretty, FNP for today's virtual visit.  While this provider is not your primary care provider (PCP), if your PCP is located in our provider database this encounter information will be shared with them immediately following your visit.   [facility] account gives you access to today's visit and all your visits, tests, and labs performed at [facility] " click here if you don't have a [facility] account or go to [email or web address]  Consent: (Patient) Wanda Gonzales provided verbal consent for this virtual visit at the beginning of the encounter.  Current Medications:  Current Outpatient Medications:    acetaminophen (TYLENOL) 325 MG tablet, Take 650 mg by mouth as needed., Disp: , Rfl:    alendronate (FOSAMAX) 70 MG tablet, Take 70 mg by mouth once a week., Disp: , Rfl:    escitalopram (LEXAPRO) 10 MG tablet, Take 1 tablet (10 mg total) by mouth daily., Disp: 90 tablet, Rfl: 1   hyoscyamine (LEVBID) 0.375 MG 12 hr tablet, Take 0.375 mg by mouth daily., Disp: , Rfl:    meloxicam (MOBIC) 15 MG tablet, ONE TAB IN THE MORNING WITH A MEAL FOR 2 WEEKS, THEN DAILY AS NEEDED PAIN., Disp: 30 tablet, Rfl: 3   Multiple Vitamin (MULTIVITAMIN) tablet, Take 1 tablet by mouth daily., Disp: , Rfl:    omeprazole (PRILOSEC) 40 MG capsule, Take 40 mg by mouth daily., Disp: , Rfl:    VITAMIN D PO, Take by mouth daily., Disp: , Rfl:    Medications ordered in this encounter:  No orders of the defined types were placed in this encounter.    *If you need refills on other medications prior to your next appointment, please contact your pharmacy*  Follow-Up: Call back or seek an in-person evaluation if the symptoms worsen or if the condition fails to improve as anticipated.  [facility] [phone]  Other Instructions 1. Take meds as prescribed 2. Use a cool mist humidifier especially during the winter months  and when heat has been humid. 3. Use saline nose sprays frequently 4. Saline irrigations of the nose can be very helpful if done frequently.  * 4X daily for 1 week*  * Use of a nettie pot can be helpful with this. Follow directions with this* 5. Drink plenty of fluids 6. Keep thermostat turn down low 7.For any cough or congestion- delsym or mucinex 8. For fever or aces or pains- take tylenol or ibuprofen appropriate for age and weight.  * for fevers greater than 101 orally you may alternate ibuprofen and tylenol every  3 hours.      If you have been instructed to have an in-person evaluation today at a local Urgent Care facility, please use the link below. It will take you to a list of all of our available [facility], including address, phone number and hours of operation. Please do not delay care.  [facility]  If you or a family member do not have a primary care provider, use the link below to schedule a visit and establish care. When you choose a [facility] primary care physician or advanced practice provider, you gain a long-term partner in health. Find a Primary Care Provider  Learn more about [facility]'s in-office and virtual care options: [facility]

## 2022-07-22 DIAGNOSIS — R197 Diarrhea, unspecified: Secondary | ICD-10-CM | POA: Diagnosis not present

## 2022-08-10 ENCOUNTER — Telehealth: Payer: Self-pay

## 2022-08-10 DIAGNOSIS — K580 Irritable bowel syndrome with diarrhea: Secondary | ICD-10-CM | POA: Diagnosis not present

## 2022-08-10 DIAGNOSIS — K219 Gastro-esophageal reflux disease without esophagitis: Secondary | ICD-10-CM | POA: Diagnosis not present

## 2022-08-10 NOTE — Telephone Encounter (Signed)
Patient stopped by to see if she could get her Rx filled at the [facility] in [place] on [address]. The Rx is escitalopram (LEXAPRO) 10 MG tablet.

## 2022-08-16 ENCOUNTER — Other Ambulatory Visit: Payer: Self-pay | Admitting: *Deleted

## 2022-08-16 DIAGNOSIS — F418 Other specified anxiety disorders: Secondary | ICD-10-CM

## 2022-08-16 MED ORDER — ESCITALOPRAM OXALATE 10 MG PO TABS: 10.0000 mg | ORAL_TABLET | Freq: Every day | ORAL | 1 refills | Status: DC

## 2022-08-17 ENCOUNTER — Encounter: Payer: Self-pay | Admitting: Family Medicine

## 2022-08-17 ENCOUNTER — Ambulatory Visit (INDEPENDENT_AMBULATORY_CARE_PROVIDER_SITE_OTHER): Payer: Medicare Other | Admitting: Family Medicine

## 2022-08-17 VITALS — BP 113/69 | HR 71 | Ht 61.0 in | Wt 144.0 lb

## 2022-08-17 DIAGNOSIS — K580 Irritable bowel syndrome with diarrhea: Secondary | ICD-10-CM | POA: Diagnosis not present

## 2022-08-17 DIAGNOSIS — F418 Other specified anxiety disorders: Secondary | ICD-10-CM | POA: Diagnosis not present

## 2022-08-17 DIAGNOSIS — K589 Irritable bowel syndrome without diarrhea: Secondary | ICD-10-CM | POA: Insufficient documentation

## 2022-08-17 NOTE — Assessment & Plan Note (Addendum)
Well on current regimen.  Plan to follow-up in 6 months.  She was able to get refills.  Call if any concerns or problems.  Continue with group coaching sessions.

## 2022-08-17 NOTE — Assessment & Plan Note (Signed)
Like to keep the has Been on the med list just in case she decides to restart it but for right now she is off of it and continuing with her PPI.

## 2022-08-17 NOTE — Progress Notes (Signed)
   Established Patient Office Visit  Subjective   Patient ID: Wanda Gonzales, female    DOB: Jan 11, 1956  Age: 66 y.o. MRN: [id number]  Chief Complaint  Patient presents with   Depression   Anxiety    HPI  F/U depression with anxiety -she is still doing well and happy with the Lexapro 10 mg.  She did miss a few days of medication over the holiday she had a requested refill but it did not come in.  So she did have to go without for a few days but she is back on it.  She would like to stay on her current regimen.  She is still sitting her body cage and doing some group sessions and still working out at home regularly.  She recently saw GI.  They have basically released her since she is doing well.  She did taper off the HIDA scan and wants to be of acute on that unless in case she needs to restart it.  She still takes omeprazole regularly.    ROS    Objective:     BP 113/69   Pulse 71   Ht '5\' 1"'$  (1.549 m)   Wt 144 lb (65.3 kg)   LMP 12/18/2013   SpO2 94%   BMI 27.21 kg/m    Physical Exam Vitals reviewed.  Constitutional:      Appearance: She is well-developed.  HENT:     Head: Normocephalic and atraumatic.  Eyes:     Conjunctiva/sclera: Conjunctivae normal.  Cardiovascular:     Rate and Rhythm: Normal rate.  Pulmonary:     Effort: Pulmonary effort is normal.  Skin:    General: Skin is dry.     Coloration: Skin is not pale.  Neurological:     Mental Status: She is alert and oriented to person, place, and time.  Psychiatric:        Behavior: Behavior normal.      No results found for any visits on 08/17/22.    The 10-year ASCVD risk score (Arnett DK, et al., 2019) is: 5%    Assessment & Plan:   Problem List Items Addressed This Visit       Digestive   IBS (irritable bowel syndrome with diarrhea)    Like to keep the has Been on the med list just in case she decides to restart it but for right now she is off of it and continuing with her PPI.         Other   Depression with anxiety - Primary    Well on current regimen.  Plan to follow-up in 6 months.  She was able to get refills.  Call if any concerns or problems.  Continue with group coaching sessions.       Return in about 6 months (around 02/15/2023) for Mood .    Beatrice Lecher, MD

## 2022-08-22 DIAGNOSIS — M810 Age-related osteoporosis without current pathological fracture: Secondary | ICD-10-CM | POA: Diagnosis not present

## 2022-08-22 DIAGNOSIS — Z87311 Personal history of (healed) other pathological fracture: Secondary | ICD-10-CM | POA: Diagnosis not present

## 2022-08-23 ENCOUNTER — Ambulatory Visit: Payer: Medicare Other | Attending: Orthopedic Surgery | Admitting: Physical Therapy

## 2022-08-23 ENCOUNTER — Other Ambulatory Visit: Payer: Self-pay

## 2022-08-23 ENCOUNTER — Other Ambulatory Visit: Payer: Self-pay | Admitting: Endocrinology

## 2022-08-23 ENCOUNTER — Encounter: Payer: Self-pay | Admitting: Physical Therapy

## 2022-08-23 DIAGNOSIS — M810 Age-related osteoporosis without current pathological fracture: Secondary | ICD-10-CM

## 2022-08-23 DIAGNOSIS — R269 Unspecified abnormalities of gait and mobility: Secondary | ICD-10-CM | POA: Insufficient documentation

## 2022-08-23 DIAGNOSIS — R262 Difficulty in walking, not elsewhere classified: Secondary | ICD-10-CM | POA: Diagnosis not present

## 2022-08-23 DIAGNOSIS — M25562 Pain in left knee: Secondary | ICD-10-CM | POA: Insufficient documentation

## 2022-08-23 DIAGNOSIS — M62838 Other muscle spasm: Secondary | ICD-10-CM | POA: Insufficient documentation

## 2022-08-23 DIAGNOSIS — Z96652 Presence of left artificial knee joint: Secondary | ICD-10-CM | POA: Insufficient documentation

## 2022-08-23 DIAGNOSIS — Z471 Aftercare following joint replacement surgery: Secondary | ICD-10-CM | POA: Diagnosis not present

## 2022-08-23 DIAGNOSIS — G8929 Other chronic pain: Secondary | ICD-10-CM

## 2022-08-23 DIAGNOSIS — M6281 Muscle weakness (generalized): Secondary | ICD-10-CM

## 2022-08-23 DIAGNOSIS — Z780 Asymptomatic menopausal state: Secondary | ICD-10-CM

## 2022-08-23 NOTE — Therapy (Signed)
OUTPATIENT PHYSICAL THERAPY LOWER EXTREMITY EVALUATION   Patient Name: Wanda Gonzales MRN: [id number] DOB:November 23, 1955, 66 y.o., female Today's Date: 08/23/2022  END OF SESSION:  PT End of Session - 08/23/22 1619     Visit Number 1    Number of Visits 16    Date for PT Re-Evaluation 10/18/22    Authorization Type BCBS    PT Start Time 1615    PT Stop Time 1700    PT Time Calculation (min) 45 min    Activity Tolerance Patient tolerated treatment well    Behavior During Therapy WFL for tasks assessed/performed             Past Medical History:  Diagnosis Date   Abnormal Pap smear, atypical squamous cells of undetermined sign (ASC-US) 1998   treated with cryo, CIN I   ADD (attention deficit disorder) 02-23-12   easily distracted.   Arthritis 02-23-12   Rt. hip and lt. arm   Closed displaced fracture of fifth metatarsal bone of left foot 03/31/2017   Depression with anxiety    History of osteoporosis    Kidney calculi 07/2015   Kidney stone    PONV (postoperative nausea and vomiting) 02-23-12   always has PONV   Post-nasal drainage 02-23-12   frequent a problem,causes a cough and mucus buildup in throat   Urethral polyp 1998   Dr. Marcello Fennel   Varicose veins    Venous insufficiency    chronic-denies any problems   Past Surgical History:  Procedure Laterality Date   blood clot removed Left 01/2012   blood clot removed from left hand    BREAST BIOPSY Right 02/14/2012   Fibroadenoma   COLONOSCOPY  08/2003   repeat 5 yrs.   COLONOSCOPY W/ BIOPSIES  12/2008   1 polyp recheck 5 yrs   EXCISION/RELEASE BURSA HIP  02/29/2012   HAND SURGERY  02/23/2012   "blood clot" excised palm left hand 01-18-12   KNEE ARTHROSCOPY W/ MENISCAL REPAIR  10/2012   KNEE ARTHROSCOPY WITH EXCISION BAKER'S CYST Left 10/2012   meniscal repair   ORIF METATARSAL FRACTURE Left 04/07/2017   Left 5th Metatarsal   SHOULDER SURGERY Right 02/2015   Dr. 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   Patient Active Problem List   Diagnosis Date Noted   IBS (irritable bowel syndrome with diarrhea) 08/17/2022   Hearing aid worn 02/10/2022   Frequent falls 11/26/2020   Depression with anxiety    History of osteoporosis    Age-related osteoporosis without current pathological fracture 07/29/2020   Osteoporosis 07/27/2020   Rhinitis 03/18/2020   Seborrheic keratosis 11/23/2017   S/P TKR (total knee replacement), left 08/25/2016   Benign paroxysmal positional vertigo 08/25/2016   Mid back pain, chronic 12/16/2015   Cervical strain 11/21/2014   DDD (degenerative disc disease), lumbar 02/17/2014   Vitamin D deficiency 03/18/2013   Trochanteric bursitis of right hip 02/29/2012   INSOMNIA 04/13/2010   Hyperlipidemia 09/17/2007   CERVICAL POLYP 09/17/2007   Attention deficit disorder 09/11/2007   UNSPECIFIED VENOUS INSUFFICIENCY 08/29/2007    PCP: Nani Gasser  REFERRING PROVIDER: Ollen Gross  REFERRING DIAG: Z47.1: Aftercare following joint replacement surgery; [id number] presence of left artificial knee joint  THERAPY DIAG:  No diagnosis found.  Rationale for Evaluation and Treatment: Rehabilitation  ONSET DATE: ~4 years ago  SUBJECTIVE:   SUBJECTIVE STATEMENT: Pt reports L knee pain. Pt had  L TKA 4 years ago. Pt had scar tissue removed May 24, 2022 but still getting pain. Pt has been going to pool and doing exercises. Pt has been walking. Has been using voltaren on her knee.   PERTINENT HISTORY: L TKA 4 years ago, L hip surgery, hx of L baker's cyst (removed)  PAIN:  Are you having pain? Yes: NPRS scale: 8 currently, 10 at worst/10 Pain location: anterior knee Pain description: Can be sharp, achy and dull Aggravating factors: Sit to stand, stairs, and getting out of bed; fatigue Relieving factors: Water therapy  PRECAUTIONS: None  WEIGHT BEARING RESTRICTIONS: No  FALLS:  Has patient  fallen in last 6 months? Yes. Number of falls 5 or 6 times (normally to the left), last fall in October  LIVING ENVIRONMENT: Lives with: lives with their spouse Lives in: House/apartment Stairs: No Has following equipment at home: None  OCCUPATION: Teaches art 2 days/wk  PLOF: Independent  PATIENT GOALS: Improve knee pain with mobility  NEXT MD VISIT: n/a  OBJECTIVE:   DIAGNOSTIC FINDINGS: n/a  PATIENT SURVEYS:  FOTO 56; predicted 78  COGNITION: Overall cognitive status: Within functional limits for tasks assessed     SENSATION: Slight numbness on L lateral knee  EDEMA:  none  MUSCLE LENGTH: Hamstrings: Right ~90 deg; Left ~90 deg  POSTURE:  No overt postural abnormalities in standing with good knee alignment  PALPATION: TTP L anterior gastroc, patellar tendon, medial and lateral knee joint line  LOWER EXTREMITY ROM:  Active ROM Right eval Left eval  Hip flexion    Hip extension    Hip abduction    Hip adduction    Hip internal rotation    Hip external rotation    Knee flexion 120 115  Knee extension 0 0  Ankle dorsiflexion    Ankle plantarflexion    Ankle inversion    Ankle eversion     (Blank rows = not tested)  LOWER EXTREMITY MMT:  MMT Right eval Left eval  Hip flexion 5 3+  Hip extension 3 3  Hip abduction 4- 3  Hip adduction 5 4-  Hip internal rotation    Hip external rotation    Knee flexion 5 5  Knee extension 5 5  Ankle dorsiflexion 5 5  Ankle plantarflexion 5 5  Ankle inversion 5 4  Ankle eversion 5 5   (Blank rows = not tested)  LOWER EXTREMITY SPECIAL TESTS:  Knee special tests: Patellafemoral apprehension test: positive   FUNCTIONAL TESTS:  SLS on R 4 sec, on L attempts but unable to hold R LE back tandem 30 sec, L LE back tandem 30 sec Painful sit to stand and squatting with increased L knee valgus noted Unable to perform stairs with reciprocal pattern -- L knee valgus noted with ascent and descent  GAIT: Distance  walked: 150 Assistive device utilized: None Level of assistance: Complete Independence Comments: antalgic, decreased stance on L, trendelenburg pattern   TODAY'S TREATMENT:  DATE: 08/23/22 See HEP    PATIENT EDUCATION:  Education details: Exam findings, POC, HEP Person educated: Patient Education method: Explanation, Demonstration, and Handouts Education comprehension: verbalized understanding, returned demonstration, and needs further education  HOME EXERCISE PROGRAM: Access Code: [email or web address] URL: [email or web address] Date: 08/24/2022 Prepared by: Vernon Prey April Kirstie Peri  Exercises - Hip Abduction with Resistance Loop  - 1 x daily - 7 x weekly - 2 sets - 10 reps - Hip Extension with Resistance Loop  - 1 x daily - 7 x weekly - 2 sets - 10 reps  Patient Education - Patellofemoral Pain  ASSESSMENT:  CLINICAL IMPRESSION: Patient is a 66 y.o. F who was seen today for physical therapy evaluation and treatment for L knee pain. Assessment significant for decreased balance, weak glutes with poor hip/knee alignment during weightbearing with knee flexed causing increased pain and discomfort with mobility. Pt would highly benefit from PT to address these issues.   OBJECTIVE IMPAIRMENTS: Abnormal gait, decreased activity tolerance, decreased balance, decreased endurance, decreased mobility, difficulty walking, decreased ROM, decreased strength, increased fascial restrictions, increased muscle spasms, impaired sensation, improper body mechanics, and pain.   ACTIVITY LIMITATIONS: bending, sitting, squatting, stairs, transfers, and locomotion level  PARTICIPATION LIMITATIONS: cleaning, laundry, driving, shopping, community activity, and occupation  PERSONAL FACTORS: Age, Past/current experiences, and Time since onset of injury/illness/exacerbation are also  affecting patient's functional outcome.   REHAB POTENTIAL: Good  CLINICAL DECISION MAKING: Evolving/moderate complexity  EVALUATION COMPLEXITY: Moderate   GOALS: Goals reviewed with patient? Yes  SHORT TERM GOALS: Target date: 09/21/2022   Pt will be ind with initial HEP Baseline: Goal status: INITIAL    LONG TERM GOALS: Target date: 10/19/2022   Pt will be ind with advancement and progression of HEP Baseline:  Goal status: INITIAL  2.  Pt will report improved pain by at least 50% with stair ascent/descent Baseline:  Goal status: INITIAL  3.  Pt will be able to perform 5x STS without UE support to demo improved functional LE strength Baseline:  Goal status: INITIAL  4.  Pt will demo improved body mechanics with less knee valgus during lifting Baseline:  Goal status: INITIAL  5.  Pt will have improved FOTO score to >/= 67 Baseline:  Goal status: INITIAL    PLAN:  PT FREQUENCY: 2x/week  PT DURATION: 8 weeks  PLANNED INTERVENTIONS: Therapeutic exercises, Therapeutic activity, Neuromuscular re-education, Balance training, Gait training, Patient/Family education, Self Care, Joint mobilization, Stair training, Orthotic/Fit training, Aquatic Therapy, Dry Needling, Electrical stimulation, Cryotherapy, Moist heat, Taping, Vasopneumatic device, Ultrasound, Ionotophoresis 4mg /ml Dexamethasone, Manual therapy, and Re-evaluation  PLAN FOR NEXT SESSION: Assess response to HEP. Work on Insurance claims handler. 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 Manual work as indicated   [employer] April Ma L [place], PT 08/23/2022, 4:19 PM

## 2022-08-31 ENCOUNTER — Ambulatory Visit: Payer: Medicare Other | Admitting: Physical Therapy

## 2022-08-31 ENCOUNTER — Encounter: Payer: Self-pay | Admitting: Physical Therapy

## 2022-08-31 ENCOUNTER — Ambulatory Visit (INDEPENDENT_AMBULATORY_CARE_PROVIDER_SITE_OTHER): Payer: Medicare Other

## 2022-08-31 DIAGNOSIS — Z96652 Presence of left artificial knee joint: Secondary | ICD-10-CM | POA: Diagnosis not present

## 2022-08-31 DIAGNOSIS — M62838 Other muscle spasm: Secondary | ICD-10-CM | POA: Diagnosis not present

## 2022-08-31 DIAGNOSIS — Z780 Asymptomatic menopausal state: Secondary | ICD-10-CM

## 2022-08-31 DIAGNOSIS — M810 Age-related osteoporosis without current pathological fracture: Secondary | ICD-10-CM | POA: Diagnosis not present

## 2022-08-31 DIAGNOSIS — R269 Unspecified abnormalities of gait and mobility: Secondary | ICD-10-CM | POA: Diagnosis not present

## 2022-08-31 DIAGNOSIS — G8929 Other chronic pain: Secondary | ICD-10-CM

## 2022-08-31 DIAGNOSIS — M25562 Pain in left knee: Secondary | ICD-10-CM | POA: Diagnosis not present

## 2022-08-31 DIAGNOSIS — R262 Difficulty in walking, not elsewhere classified: Secondary | ICD-10-CM

## 2022-08-31 DIAGNOSIS — M6281 Muscle weakness (generalized): Secondary | ICD-10-CM

## 2022-08-31 DIAGNOSIS — M8589 Other specified disorders of bone density and structure, multiple sites: Secondary | ICD-10-CM | POA: Diagnosis not present

## 2022-08-31 DIAGNOSIS — R296 Repeated falls: Secondary | ICD-10-CM

## 2022-08-31 DIAGNOSIS — Z471 Aftercare following joint replacement surgery: Secondary | ICD-10-CM | POA: Diagnosis not present

## 2022-08-31 DIAGNOSIS — M545 Low back pain, unspecified: Secondary | ICD-10-CM

## 2022-08-31 NOTE — Therapy (Signed)
OUTPATIENT PHYSICAL THERAPY TREATMENT   Patient Name: Wanda Gonzales MRN: [id number] DOB:Jan 17, 1956, 66 y.o., female Today's Date: 08/31/2022  END OF SESSION:  PT End of Session - 08/31/22 1523     Visit Number 2    Number of Visits 16    Date for PT Re-Evaluation 10/18/22    Authorization Type BCBS    PT Start Time 1525    PT Stop Time 1610    PT Time Calculation (min) 45 min    Activity Tolerance Patient tolerated treatment well    Behavior During Therapy WFL for tasks assessed/performed              Past Medical History:  Diagnosis Date   Abnormal Pap smear, atypical squamous cells of undetermined sign (ASC-US) 1998   treated with cryo, CIN I   ADD (attention deficit disorder) 02-23-12   easily distracted.   Arthritis 02-23-12   Rt. hip and lt. arm   Closed displaced fracture of fifth metatarsal bone of left foot 03/31/2017   Depression with anxiety    History of osteoporosis    Kidney calculi 07/2015   Kidney stone    PONV (postoperative nausea and vomiting) 02-23-12   always has PONV   Post-nasal drainage 02-23-12   frequent a problem,causes a cough and mucus buildup in throat   Urethral polyp 1998   Dr. Hartley Barefoot   Varicose veins    Venous insufficiency    chronic-denies any problems   Past Surgical History:  Procedure Laterality Date   blood clot removed Left 01/2012   blood clot removed from left hand    BREAST BIOPSY Right 02/14/2012   Fibroadenoma   COLONOSCOPY  08/2003   repeat 5 yrs.   COLONOSCOPY W/ BIOPSIES  12/2008   1 polyp recheck 5 yrs   EXCISION/RELEASE BURSA HIP  02/29/2012   HAND SURGERY  02/23/2012   "blood clot" excised palm left hand 01-18-12   KNEE ARTHROSCOPY W/ MENISCAL REPAIR  10/2012   KNEE ARTHROSCOPY WITH EXCISION BAKER'S CYST Left 10/2012   meniscal repair   ORIF METATARSAL FRACTURE Left 04/07/2017   Left 5th Metatarsal   SHOULDER SURGERY Right 02/2015   Dr. 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   Patient Active Problem List   Diagnosis Date Noted   IBS (irritable bowel syndrome with diarrhea) 08/17/2022   Hearing aid worn 02/10/2022   Frequent falls 11/26/2020   Depression with anxiety    History of osteoporosis    Age-related osteoporosis without current pathological fracture 07/29/2020   Osteoporosis 07/27/2020   Rhinitis 03/18/2020   Seborrheic keratosis 11/23/2017   S/P TKR (total knee replacement), left 08/25/2016   Benign paroxysmal positional vertigo 08/25/2016   Mid back pain, chronic 12/16/2015   Cervical strain 11/21/2014   DDD (degenerative disc disease), lumbar 02/17/2014   Vitamin D deficiency 03/18/2013   Trochanteric bursitis of right hip 02/29/2012   INSOMNIA 04/13/2010   Hyperlipidemia 09/17/2007   CERVICAL POLYP 09/17/2007   Attention deficit disorder 09/11/2007   UNSPECIFIED VENOUS INSUFFICIENCY 08/29/2007    PCP: Beatrice Lecher  REFERRING PROVIDER: Lemont Fillers DIAG: Z47.1: Aftercare following joint replacement surgery; [id number] presence of left artificial knee joint  THERAPY DIAG:  Chronic pain of left knee  Muscle weakness (generalized)  Difficulty in walking, not elsewhere classified  Chronic bilateral low back pain without sciatica  Repeated falls  Rationale for  Evaluation and Treatment: Rehabilitation  ONSET DATE: ~4 years ago  SUBJECTIVE:   SUBJECTIVE STATEMENT: Pt notes increased L knee pain today -- particularly with extension. Not so much during walking.   PERTINENT HISTORY: L TKA 4 years ago, L hip surgery, hx of L baker's cyst (removed) From eval: Pt reports L knee pain. Pt had L TKA 4 years ago. Pt had scar tissue removed May 24, 2022 but still getting pain. Pt has been going to pool and doing exercises. Pt has been walking. Has been using voltaren on her knee.   PAIN:  Are you having pain? Yes: NPRS scale: 8 currently/10 Pain location:  anterior knee Pain description: Can be sharp, achy and dull Aggravating factors: Sit to stand, stairs, and getting out of bed; fatigue Relieving factors: Water therapy  PRECAUTIONS: None  WEIGHT BEARING RESTRICTIONS: No  FALLS:  Has patient fallen in last 6 months? Yes. Number of falls 5 or 6 times (normally to the left), last fall in October  OCCUPATION: Teaches art 2 days/wk  PATIENT GOALS: Improve knee pain with mobility  NEXT MD VISIT: n/a  OBJECTIVE:  From eval unless otherwise noted  PALPATION: TTP L anterior gastroc, patellar tendon, medial and lateral knee joint line  LOWER EXTREMITY ROM:  Active ROM Right eval Left eval  Knee flexion 120 115  Knee extension 0 0   (Blank rows = not tested)  LOWER EXTREMITY MMT:  MMT Right eval Left eval  Hip flexion 5 3+  Hip extension 3 3  Hip abduction 4- 3  Hip adduction 5 4-  Hip internal rotation    Hip external rotation    Knee flexion 5 5  Knee extension 5 5  Ankle dorsiflexion 5 5  Ankle plantarflexion 5 5  Ankle inversion 5 4  Ankle eversion 5 5   (Blank rows = not tested)  LOWER EXTREMITY SPECIAL TESTS:  Knee special tests: Patellafemoral apprehension test: positive   FUNCTIONAL TESTS:  SLS on R 4 sec, on L attempts but unable to hold R LE back tandem 30 sec, L LE back tandem 30 sec Painful sit to stand and squatting with increased L knee valgus noted Unable to perform stairs with reciprocal pattern -- L knee valgus noted with ascent and descent  GAIT: Distance walked: 150 Assistive device utilized: None Level of assistance: Complete Independence Comments: antalgic, decreased stance on L, trendelenburg pattern   [facility] Adult PT Treatment:                                                DATE: 08/31/22 Therapeutic Exercise: Supine Hamstring stretch with strap 2 x30 sec SAQ 2x10 yellow TB Bridge with clamshell 2x10 yellow TB Sidelying Quad stretch with strap x 30 sec Hip abd 2x10 Trial of clam;  however hurt back of knee Prone Hip ext 2x10 Quad set 2x10 Sitting Sit<>stand 2x5 focus on decreasing knee valgus and performing hip hinge Manual Therapy: STM, IASTM, & TPR L quad K tape two "I" strips from  Modalities: Vaso x 10 min, 34 deg, continuous, low pressure   TODAY'S TREATMENT:  DATE: 08/23/22 See HEP    PATIENT EDUCATION:  Education details: Proper body mechanics with sit<>stand to decrease knee valgus and use more hips Person educated: Patient Education method: Explanation, Demonstration, and Handouts Education comprehension: verbalized understanding, returned demonstration, and needs further education  HOME EXERCISE PROGRAM: Access Code: [email or web address] URL: [email or web address] Date: 08/24/2022 Prepared by: Estill Bamberg April Thurnell Garbe  Exercises - Hip Abduction with Resistance Loop  - 1 x daily - 7 x weekly - 2 sets - 10 reps - Hip Extension with Resistance Loop  - 1 x daily - 7 x weekly - 2 sets - 10 reps  Patient Education - Patellofemoral Pain  ASSESSMENT:  CLINICAL IMPRESSION: Treatment focused on decreasing quad trigger points and patellar tendon pain. Provided gentle manual work, ROM, and continued hip strengthening as tolerated in NWB. Performed trial of taping. Worked on Journalist, newspaper with sit<>stand to decrease knee valgus.   OBJECTIVE IMPAIRMENTS: Abnormal gait, decreased activity tolerance, decreased balance, decreased endurance, decreased mobility, difficulty walking, decreased ROM, decreased strength, increased fascial restrictions, increased muscle spasms, impaired sensation, improper body mechanics, and pain.    GOALS: Goals reviewed with patient? Yes  SHORT TERM GOALS: Target date: 09/21/2022   Pt will be ind with initial HEP Baseline: Goal status: INITIAL    LONG TERM GOALS: Target date:  10/19/2022   Pt will be ind with advancement and progression of HEP Baseline:  Goal status: INITIAL  2.  Pt will report improved pain by at least 50% with stair ascent/descent Baseline:  Goal status: INITIAL  3.  Pt will be able to perform 5x STS without UE support to demo improved functional LE strength Baseline:  Goal status: INITIAL  4.  Pt will demo improved body mechanics with less knee valgus during lifting Baseline:  Goal status: INITIAL  5.  Pt will have improved FOTO score to >/= 67 Baseline:  Goal status: INITIAL    PLAN:  PT FREQUENCY: 2x/week  PT DURATION: 8 weeks  PLANNED INTERVENTIONS: Therapeutic exercises, Therapeutic activity, Neuromuscular re-education, Balance training, Gait training, Patient/Family education, Self Care, Joint mobilization, Stair training, Orthotic/Fit training, Aquatic Therapy, Dry Needling, Electrical stimulation, Cryotherapy, Moist heat, Taping, Vasopneumatic device, Ultrasound, Ionotophoresis '4mg'$ /ml Dexamethasone, Manual therapy, and Re-evaluation  PLAN FOR NEXT SESSION: Assess response to HEP. Work on Product/process development scientist. Work on Economist with stairs and lifting. Manual work as indicated   [employer] April [place], PT 08/31/2022, 3:23 PM

## 2022-09-01 ENCOUNTER — Ambulatory Visit: Payer: Medicare Other | Admitting: Physical Therapy

## 2022-09-01 ENCOUNTER — Encounter: Payer: Self-pay | Admitting: Physical Therapy

## 2022-09-01 DIAGNOSIS — M6281 Muscle weakness (generalized): Secondary | ICD-10-CM

## 2022-09-01 DIAGNOSIS — Z471 Aftercare following joint replacement surgery: Secondary | ICD-10-CM | POA: Diagnosis not present

## 2022-09-01 DIAGNOSIS — G8929 Other chronic pain: Secondary | ICD-10-CM

## 2022-09-01 DIAGNOSIS — R269 Unspecified abnormalities of gait and mobility: Secondary | ICD-10-CM | POA: Diagnosis not present

## 2022-09-01 DIAGNOSIS — Z96652 Presence of left artificial knee joint: Secondary | ICD-10-CM | POA: Diagnosis not present

## 2022-09-01 DIAGNOSIS — R262 Difficulty in walking, not elsewhere classified: Secondary | ICD-10-CM | POA: Diagnosis not present

## 2022-09-01 DIAGNOSIS — M25562 Pain in left knee: Secondary | ICD-10-CM | POA: Diagnosis not present

## 2022-09-01 DIAGNOSIS — M62838 Other muscle spasm: Secondary | ICD-10-CM | POA: Diagnosis not present

## 2022-09-01 NOTE — Therapy (Addendum)
OUTPATIENT PHYSICAL THERAPY TREATMENT   Patient Name: Wanda Gonzales MRN: [id number] DOB:06-Nov-1955, 66 y.o., female Today's Date: 09/01/2022  END OF SESSION:  PT End of Session - 09/01/22 1017     Visit Number 3    Number of Visits 16    Date for PT Re-Evaluation 10/18/22    PT Start Time 1017    PT Stop Time 1100    PT Time Calculation (min) 43 min    Activity Tolerance Patient tolerated treatment well    Behavior During Therapy WFL for tasks assessed/performed               Past Medical History:  Diagnosis Date   Abnormal Pap smear, atypical squamous cells of undetermined sign (ASC-US) 1998   treated with cryo, CIN I   ADD (attention deficit disorder) 02-23-12   easily distracted.   Arthritis 02-23-12   Rt. hip and lt. arm   Closed displaced fracture of fifth metatarsal bone of left foot 03/31/2017   Depression with anxiety    History of osteoporosis    Kidney calculi 07/2015   Kidney stone    PONV (postoperative nausea and vomiting) 02-23-12   always has PONV   Post-nasal drainage 02-23-12   frequent a problem,causes a cough and mucus buildup in throat   Urethral polyp 1998   Dr. Marcello Fennel   Varicose veins    Venous insufficiency    chronic-denies any problems   Past Surgical History:  Procedure Laterality Date   blood clot removed Left 01/2012   blood clot removed from left hand    BREAST BIOPSY Right 02/14/2012   Fibroadenoma   COLONOSCOPY  08/2003   repeat 5 yrs.   COLONOSCOPY W/ BIOPSIES  12/2008   1 polyp recheck 5 yrs   EXCISION/RELEASE BURSA HIP  02/29/2012   HAND SURGERY  02/23/2012   "blood clot" excised palm left hand 01-18-12   KNEE ARTHROSCOPY W/ MENISCAL REPAIR  10/2012   KNEE ARTHROSCOPY WITH EXCISION BAKER'S CYST Left 10/2012   meniscal repair   ORIF METATARSAL FRACTURE Left 04/07/2017   Left 5th Metatarsal   SHOULDER SURGERY Right 02/2015   Dr. 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   Patient Active Problem List   Diagnosis Date Noted   IBS (irritable bowel syndrome with diarrhea) 08/17/2022   Hearing aid worn 02/10/2022   Frequent falls 11/26/2020   Depression with anxiety    History of osteoporosis    Age-related osteoporosis without current pathological fracture 07/29/2020   Osteoporosis 07/27/2020   Rhinitis 03/18/2020   Seborrheic keratosis 11/23/2017   S/P TKR (total knee replacement), left 08/25/2016   Benign paroxysmal positional vertigo 08/25/2016   Mid back pain, chronic 12/16/2015   Cervical strain 11/21/2014   DDD (degenerative disc disease), lumbar 02/17/2014   Vitamin D deficiency 03/18/2013   Trochanteric bursitis of right hip 02/29/2012   INSOMNIA 04/13/2010   Hyperlipidemia 09/17/2007   CERVICAL POLYP 09/17/2007   Attention deficit disorder 09/11/2007   UNSPECIFIED VENOUS INSUFFICIENCY 08/29/2007    PCP: Nani Gasser  REFERRING PROVIDER: Freddi Starr DIAG: Z47.1: Aftercare following joint replacement surgery; [phone] presence of left artificial knee joint  THERAPY DIAG:  Chronic pain of left knee  Muscle weakness (generalized)  Difficulty in walking, not elsewhere classified  Rationale for Evaluation and Treatment: Rehabilitation  ONSET DATE: ~4 years ago  SUBJECTIVE:   SUBJECTIVE STATEMENT:  Patient reports she continues to have mild pain below kneecap. Patient states she has been using heat on front thigh to alleviate soreness and has been using using foam roller on quads and hamstrings.  PERTINENT HISTORY: L TKA 4 years ago, L hip surgery, hx of L baker's cyst (removed) From eval: Pt reports L knee pain. Pt had L TKA 4 years ago. Pt had scar tissue removed May 24, 2022 but still getting pain. Pt has been going to pool and doing exercises. Pt has been walking. Has been using voltaren on her knee.   PAIN:  Are you having pain? Yes: NPRS scale: 0 currently/10 Pain location:  anterior knee Pain description: Can be sharp, achy and dull Aggravating factors: Sit to stand, stairs, and getting out of bed; fatigue Relieving factors: Water therapy  PRECAUTIONS: None  WEIGHT BEARING RESTRICTIONS: No  FALLS:  Has patient fallen in last 6 months? Yes. Number of falls 5 or 6 times (normally to the left), last fall in October  OCCUPATION: Teaches art 2 days/wk  PATIENT GOALS: Improve knee pain with mobility  NEXT MD VISIT: n/a  OBJECTIVE:  From eval unless otherwise noted  PALPATION: TTP L anterior gastroc, patellar tendon, medial and lateral knee joint line  LOWER EXTREMITY ROM:  Active ROM Right eval Left eval  Knee flexion 120 115  Knee extension 0 0   (Blank rows = not tested)  LOWER EXTREMITY MMT:  MMT Right eval Left eval  Hip flexion 5 3+  Hip extension 3 3  Hip abduction 4- 3  Hip adduction 5 4-  Hip internal rotation    Hip external rotation    Knee flexion 5 5  Knee extension 5 5  Ankle dorsiflexion 5 5  Ankle plantarflexion 5 5  Ankle inversion 5 4  Ankle eversion 5 5   (Blank rows = not tested)  LOWER EXTREMITY SPECIAL TESTS:  Knee special tests: Patellafemoral apprehension test: positive   FUNCTIONAL TESTS:  SLS on R 4 sec, on L attempts but unable to hold R LE back tandem 30 sec, L LE back tandem 30 sec Painful sit to stand and squatting with increased L knee valgus noted Unable to perform stairs with reciprocal pattern -- L knee valgus noted with ascent and descent  GAIT: Distance walked: 150 Assistive device utilized: None Level of assistance: Complete Independence Comments: antalgic, decreased stance on L, trendelenburg pattern  [facility] Adult PT Treatment:                                                DATE: 09/01/22 Therapeutic Exercise: NuStep L6 x Hamstring stretch with strap 2 x30 sec Seated L quad stretch 2x30 sec Leg press (P4, YTB around knees) DL 09# [id number]; L SL 14# 2x6 Standing B step out YTB  2x10 Bent forward hip ext YTB 2x10 Sit<>stand 2x5 focus on decreasing knee valgus and performing hip hinge  Tandem stance balance 3x30" Therapeutic Activity: Ambulating with focus on heel strike Tandem stance balance 3x30" B Instruction on using foam roller to address tension in    [facility] Adult PT  Treatment:  DATE: 08/31/22 Therapeutic Exercise: Supine Hamstring stretch with strap 2 x30 sec SAQ 2x10 yellow TB Bridge with clamshell 2x10 yellow TB Sidelying Quad stretch with strap x 30 sec Hip abd 2x10 Trial of clam; however hurt back of knee Prone Hip ext 2x10 Quad set 2x10 Sitting Sit<>stand 2x5 focus on decreasing knee valgus and performing hip hinge Manual Therapy: STM, IASTM, & TPR L quad K tape two "I" strips from  Modalities: Vaso x 10 min, 34 deg, continuous, low pressure   PATIENT EDUCATION:  Education details: Progressing HEP Person educated: Patient Education method: Explanation, Demonstration, and Handouts Education comprehension: verbalized understanding, returned demonstration, and needs further education  HOME EXERCISE PROGRAM: Access Code: [email or web address] URL: [email or web address] Date: 08/24/2022 Prepared by: Vernon Prey April Kirstie Peri  Exercises - Hip Abduction with Resistance Loop  - 1 x daily - 7 x weekly - 2 sets - 10 reps - Hip Extension with Resistance Loop  - 1 x daily - 7 x weekly - 2 sets - 10 reps Access Code: [email or web address] URL: [email or web address] Date: 09/01/2022 Prepared by: Carlynn Herald  Exercises - Hip Abduction with Resistance Loop  - 1 x daily - 7 x weekly - 2 sets - 10 reps - Hip Extension with Resistance Loop  - 1 x daily - 7 x weekly - 2 sets - 10 reps - Seated Piriformis Stretch  - 1 x daily - 7 x weekly - 3 sets - 3 reps - 30 sec hold - Sit to Stand with Armchair  - 1 x daily - 7 x weekly - 3 sets - 10 reps - Tandem Stance with Chair Support  - 1 x daily - 7 x  weekly - 1 sets - 3 reps - 30 sec hold  Patient Education - Patellofemoral Pain Patient Education - Patellofemoral Pain  ASSESSMENT:  CLINICAL IMPRESSION: Leg press introduced to progress LE strengthening; light theraband added to improve hip abd and decrease knee valgum during exercise. Patient demonstrated improved proprioceptive awareness and maintaining proper hip/knee alignment. Continued progressing proper body mechanics with sit to stand transitions and hip strengthening.  OBJECTIVE IMPAIRMENTS: Abnormal gait, decreased activity tolerance, decreased balance, decreased endurance, decreased mobility, difficulty walking, decreased ROM, decreased strength, increased fascial restrictions, increased muscle spasms, impaired sensation, improper body mechanics, and pain.    GOALS: Goals reviewed with patient? Yes  SHORT TERM GOALS: Target date: 09/21/2022   Pt will be ind with initial HEP Baseline: Goal status: IN PROGRESS    LONG TERM GOALS: Target date: 10/19/2022   Pt will be ind with advancement and progression of HEP Baseline:  Goal status: IN PROGRESS  2.  Pt will report improved pain by at least 50% with stair ascent/descent Baseline:  Goal status: IN PROGRESS  3.  Pt will be able to perform 5x STS without UE support to demo improved functional LE strength Baseline:  Goal status: IN PROGRESS  4.  Pt will demo improved body mechanics with less knee valgus during lifting Baseline:  Goal status: IN PROGRESS  5.  Pt will have improved FOTO score to >/= 67 Baseline:  Goal status: IN PROGRESS    PLAN:  PT FREQUENCY: 2x/week  PT DURATION: 8 weeks  PLANNED INTERVENTIONS: Therapeutic exercises, Therapeutic activity, Neuromuscular re-education, Balance training, Gait training, Patient/Family education, Self Care, Joint mobilization, Stair training, Orthotic/Fit training, Aquatic Therapy, Dry Needling, Electrical stimulation, Cryotherapy, Moist heat, Taping,  Vasopneumatic device, Ultrasound, Ionotophoresis 4mg /ml Dexamethasone, Manual therapy, and Re-evaluation  PLAN FOR NEXT SESSION: Progress HEP as tolerated.Continue  glute strengthening. 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 Manual work as indicated   Carlynn Herald, PTA 09/01/2022 11:02 AM  Vernon Prey April Ma L Sladen Plancarte, PT 09/01/2022, 11:04 AM

## 2022-09-08 ENCOUNTER — Ambulatory Visit: Payer: Medicare Other | Admitting: Physical Therapy

## 2022-09-08 ENCOUNTER — Encounter: Payer: Self-pay | Admitting: Physical Therapy

## 2022-09-08 DIAGNOSIS — M25562 Pain in left knee: Secondary | ICD-10-CM | POA: Diagnosis not present

## 2022-09-08 DIAGNOSIS — R262 Difficulty in walking, not elsewhere classified: Secondary | ICD-10-CM

## 2022-09-08 DIAGNOSIS — M62838 Other muscle spasm: Secondary | ICD-10-CM | POA: Diagnosis not present

## 2022-09-08 DIAGNOSIS — G8929 Other chronic pain: Secondary | ICD-10-CM

## 2022-09-08 DIAGNOSIS — R269 Unspecified abnormalities of gait and mobility: Secondary | ICD-10-CM | POA: Diagnosis not present

## 2022-09-08 DIAGNOSIS — Z471 Aftercare following joint replacement surgery: Secondary | ICD-10-CM | POA: Diagnosis not present

## 2022-09-08 DIAGNOSIS — Z96652 Presence of left artificial knee joint: Secondary | ICD-10-CM | POA: Diagnosis not present

## 2022-09-08 DIAGNOSIS — M6281 Muscle weakness (generalized): Secondary | ICD-10-CM

## 2022-09-08 NOTE — Therapy (Signed)
OUTPATIENT PHYSICAL THERAPY TREATMENT   Patient Name: Wanda Gonzales MRN: [id number] DOB:[birth year], 66 y.o., female Today's Date: 09/08/2022  END OF SESSION:  PT End of Session - 09/08/22 1100     Visit Number 4    Number of Visits 16    Date for PT Re-Evaluation 10/18/22    Authorization Type [employer] Medicare    Progress Note Due on Visit 10    PT Start Time 1101    PT Stop Time 1140    PT Time Calculation (min) 39 min    Activity Tolerance Patient tolerated treatment well    Behavior During Therapy WFL for tasks assessed/performed               Past Medical History:  Diagnosis Date   Abnormal Pap smear, atypical squamous cells of undetermined sign (ASC-US) 1998   treated with cryo, CIN I   ADD (attention deficit disorder) 02-23-12   easily distracted.   Arthritis 02-23-12   Rt. hip and lt. arm   Closed displaced fracture of fifth metatarsal bone of left foot 03/31/2017   Depression with anxiety    History of osteoporosis    Kidney calculi 07/2015   Kidney stone    PONV (postoperative nausea and vomiting) 02-23-12   always has PONV   Post-nasal drainage 02-23-12   frequent a problem,causes a cough and mucus buildup in throat   Urethral polyp 1998   Dr. Hartley Barefoot   Varicose veins    Venous insufficiency    chronic-denies any problems   Past Surgical History:  Procedure Laterality Date   blood clot removed Left 01/2012   blood clot removed from left hand    BREAST BIOPSY Right 02/14/2012   Fibroadenoma   COLONOSCOPY  08/2003   repeat 5 yrs.   COLONOSCOPY W/ BIOPSIES  12/2008   1 polyp recheck 5 yrs   EXCISION/RELEASE BURSA HIP  02/29/2012   HAND SURGERY  02/23/2012   "blood clot" excised palm left hand 01-18-12   KNEE ARTHROSCOPY W/ MENISCAL REPAIR  10/2012   KNEE ARTHROSCOPY WITH EXCISION BAKER'S CYST Left 10/2012   meniscal repair   ORIF METATARSAL FRACTURE Left 04/07/2017   Left 5th Metatarsal   SHOULDER SURGERY Right 02/2015   Dr. 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   Patient Active Problem List   Diagnosis Date Noted   IBS (irritable bowel syndrome with diarrhea) 08/17/2022   Hearing aid worn 02/10/2022   Frequent falls 11/26/2020   Depression with anxiety    History of osteoporosis    Age-related osteoporosis without current pathological fracture 07/29/2020   Osteoporosis 07/27/2020   Rhinitis 03/18/2020   Seborrheic keratosis 11/23/2017   S/P TKR (total knee replacement), left 08/25/2016   Benign paroxysmal positional vertigo 08/25/2016   Mid back pain, chronic 12/16/2015   Cervical strain 11/21/2014   DDD (degenerative disc disease), lumbar 02/17/2014   Vitamin D deficiency 03/18/2013   Trochanteric bursitis of right hip 02/29/2012   INSOMNIA 04/13/2010   Hyperlipidemia 09/17/2007   CERVICAL POLYP 09/17/2007   Attention deficit disorder 09/11/2007   UNSPECIFIED VENOUS INSUFFICIENCY 08/29/2007    PCP: Beatrice Lecher  REFERRING PROVIDER: Lemont Fillers DIAG: Z47.1: Aftercare following joint replacement surgery; [phone] presence of left artificial knee joint  THERAPY DIAG:  Chronic pain of left knee  Muscle weakness (generalized)  Difficulty in walking, not elsewhere classified  Rationale for  Evaluation and Treatment: Rehabilitation  ONSET DATE: ~4 years ago  SUBJECTIVE:   SUBJECTIVE STATEMENT: Pt states she's doing okay. Pt reports her knee popped while exercising yesterday and it hurt. Has continued to use foam roller. Pt notes walking while shopping her hips hurt. Feels tightness on top of her thigh.   PERTINENT HISTORY: L TKA 4 years ago, L hip surgery, hx of L baker's cyst (removed) From eval: Pt reports L knee pain. Pt had L TKA 4 years ago. Pt had scar tissue removed May 24, 2022 but still getting pain. Pt has been going to pool and doing exercises. Pt has been walking. Has been using voltaren on her knee.    PAIN:  Are you having pain? Yes: NPRS scale: 0 currently/10 Pain location: anterior knee Pain description: Can be sharp, achy and dull Aggravating factors: Sit to stand, stairs, and getting out of bed; fatigue Relieving factors: Water therapy  PRECAUTIONS: None  WEIGHT BEARING RESTRICTIONS: No  FALLS:  Has patient fallen in last 6 months? Yes. Number of falls 5 or 6 times (normally to the left), last fall in October  OCCUPATION: Teaches art 2 days/wk  PATIENT GOALS: Improve knee pain with mobility  NEXT MD VISIT: n/a  OBJECTIVE:  From eval unless otherwise noted  PALPATION: TTP L anterior gastroc, patellar tendon, medial and lateral knee joint line  LOWER EXTREMITY ROM:  Active ROM Right eval Left eval  Knee flexion 120 115  Knee extension 0 0   (Blank rows = not tested)  LOWER EXTREMITY MMT:  MMT Right eval Left eval  Hip flexion 5 3+  Hip extension 3 3  Hip abduction 4- 3  Hip adduction 5 4-  Hip internal rotation    Hip external rotation    Knee flexion 5 5  Knee extension 5 5  Ankle dorsiflexion 5 5  Ankle plantarflexion 5 5  Ankle inversion 5 4  Ankle eversion 5 5   (Blank rows = not tested)  LOWER EXTREMITY SPECIAL TESTS:  Knee special tests: Patellafemoral apprehension test: positive   FUNCTIONAL TESTS:  SLS on R 4 sec, on L attempts but unable to hold R LE back tandem 30 sec, L LE back tandem 30 sec Painful sit to stand and squatting with increased L knee valgus noted Unable to perform stairs with reciprocal pattern -- L knee valgus noted with ascent and descent  GAIT: Distance walked: 150 Assistive device utilized: None Level of assistance: Complete Independence Comments: antalgic, decreased stance on L, trendelenburg pattern  [facility] Adult PT Treatment:                                                DATE: 09/08/22 Therapeutic Exercise: Nustep L6 x 5 min LEs only Sitting Hamstring 2x30 sec Adductor 2x30 sec Hip flexor stretch x30  sec Hip IR/ER red TB 2x10 Prone Quad stretch with strap 2x30 sec hurt pt's knee Attempted hip ER but painful to pts knee Standing Hip adduction with slider 2x10 Hip abd yellow TB 2x10 Hip ext yellow TB 2x10 Manual Therapy: IASTM, STM & TPR quad and adductor    [facility] Adult PT Treatment:  DATE: 09/01/22 Therapeutic Exercise: NuStep L6 x 52mn Hamstring stretch with strap 2 x30 sec Seated L quad stretch 2x30 sec Leg press (P4, YTB around knees) DL 90# 3x10; L SL 64# 2x6 Standing B step out YTB 2x10 Bent forward hip ext YTB 2x10 Sit<>stand 2x5 focus on decreasing knee valgus and performing hip hinge  Tandem stance balance 3x30" Therapeutic Activity: Ambulating with focus on heel strike Tandem stance balance 3x30" B Instruction on using foam roller to address tension in     PATIENT EDUCATION:  Education details: Progressing HEP Person educated: Patient Education method: Explanation, Demonstration, and Handouts Education comprehension: verbalized understanding, returned demonstration, and needs further education  HOME EXERCISE PROGRAM: Access Code: [email or web address] URL: [email or web address] Date: 08/24/2022 Prepared by: GEstill BambergApril MThurnell Garbe Exercises - Hip Abduction with Resistance Loop  - 1 x daily - 7 x weekly - 2 sets - 10 reps - Hip Extension with Resistance Loop  - 1 x daily - 7 x weekly - 2 sets - 10 reps Access Code: [email or web address] URL: [email or web address] Date: 09/01/2022 Prepared by: KHelane Gunther Exercises - Hip Abduction with Resistance Loop  - 1 x daily - 7 x weekly - 2 sets - 10 reps - Hip Extension with Resistance Loop  - 1 x daily - 7 x weekly - 2 sets - 10 reps - Seated Piriformis Stretch  - 1 x daily - 7 x weekly - 3 sets - 3 reps - 30 sec hold - Sit to Stand with Armchair  - 1 x daily - 7 x weekly - 3 sets - 10 reps - Tandem Stance with Chair Support  - 1 x daily - 7 x weekly - 1  sets - 3 reps - 30 sec hold  Patient Education - Patellofemoral Pain   ASSESSMENT:  CLINICAL IMPRESSION: Treatment focused on improving hip strength as tolerated. Initiated strengthening for hip IR/ER -- pt with greatest difficulty during hip IR in seated. Unable to tolerate prone hamstring curl or hip IR/ER without increased knee pain.   OBJECTIVE IMPAIRMENTS: Abnormal gait, decreased activity tolerance, decreased balance, decreased endurance, decreased mobility, difficulty walking, decreased ROM, decreased strength, increased fascial restrictions, increased muscle spasms, impaired sensation, improper body mechanics, and pain.    GOALS: Goals reviewed with patient? Yes  SHORT TERM GOALS: Target date: 09/21/2022   Pt will be ind with initial HEP Baseline: Goal status: IN PROGRESS    LONG TERM GOALS: Target date: 10/19/2022   Pt will be ind with advancement and progression of HEP Baseline:  Goal status: IN PROGRESS  2.  Pt will report improved pain by at least 50% with stair ascent/descent Baseline:  Goal status: IN PROGRESS  3.  Pt will be able to perform 5x STS without UE support to demo improved functional LE strength Baseline:  Goal status: IN PROGRESS  4.  Pt will demo improved body mechanics with less knee valgus during lifting Baseline:  Goal status: IN PROGRESS  5.  Pt will have improved FOTO score to >/= 67 Baseline:  Goal status: IN PROGRESS    PLAN:  PT FREQUENCY: 2x/week  PT DURATION: 8 weeks  PLANNED INTERVENTIONS: Therapeutic exercises, Therapeutic activity, Neuromuscular re-education, Balance training, Gait training, Patient/Family education, Self Care, Joint mobilization, Stair training, Orthotic/Fit training, Aquatic Therapy, Dry Needling, Electrical stimulation, Cryotherapy, Moist heat, Taping, Vasopneumatic device, Ultrasound, Ionotophoresis '4mg'$ /ml Dexamethasone, Manual therapy, and Re-evaluation  PLAN FOR NEXT SESSION: Progress HEP as  tolerated.Continue glute strengthening. Work on bEconomistwith stairs and  lifting. Manual work as indicated    [employer] April Ma L [place], PT 09/08/2022, 11:01 AM

## 2022-09-09 ENCOUNTER — Ambulatory Visit (HOSPITAL_BASED_OUTPATIENT_CLINIC_OR_DEPARTMENT_OTHER): Payer: Medicare Other | Attending: Orthopedic Surgery | Admitting: Physical Therapy

## 2022-09-09 ENCOUNTER — Encounter (HOSPITAL_BASED_OUTPATIENT_CLINIC_OR_DEPARTMENT_OTHER): Payer: Self-pay | Admitting: Physical Therapy

## 2022-09-09 DIAGNOSIS — G8929 Other chronic pain: Secondary | ICD-10-CM | POA: Diagnosis not present

## 2022-09-09 DIAGNOSIS — R262 Difficulty in walking, not elsewhere classified: Secondary | ICD-10-CM | POA: Diagnosis not present

## 2022-09-09 DIAGNOSIS — Z471 Aftercare following joint replacement surgery: Secondary | ICD-10-CM | POA: Diagnosis not present

## 2022-09-09 DIAGNOSIS — M6281 Muscle weakness (generalized): Secondary | ICD-10-CM | POA: Insufficient documentation

## 2022-09-09 DIAGNOSIS — Z96652 Presence of left artificial knee joint: Secondary | ICD-10-CM | POA: Insufficient documentation

## 2022-09-09 DIAGNOSIS — M545 Low back pain, unspecified: Secondary | ICD-10-CM

## 2022-09-09 DIAGNOSIS — R296 Repeated falls: Secondary | ICD-10-CM

## 2022-09-09 NOTE — Therapy (Signed)
OUTPATIENT PHYSICAL THERAPY TREATMENT   Patient Name: Wanda Gonzales MRN: [id number] DOB:[birth year], 66 y.o., female Today's Date: 09/09/2022  END OF SESSION:  PT End of Session - 09/09/22 1127     Visit Number 5    Number of Visits 16    Date for PT Re-Evaluation 10/18/22    Authorization Type BCBS Medicare    Progress Note Due on Visit 10    PT Start Time 1119    PT Stop Time 1158    PT Time Calculation (min) 39 min    Activity Tolerance Patient tolerated treatment well    Behavior During Therapy WFL for tasks assessed/performed               Past Medical History:  Diagnosis Date   Abnormal Pap smear, atypical squamous cells of undetermined sign (ASC-US) 1998   treated with cryo, CIN I   ADD (attention deficit disorder) 02-23-12   easily distracted.   Arthritis 02-23-12   Rt. hip and lt. arm   Closed displaced fracture of fifth metatarsal bone of left foot 03/31/2017   Depression with anxiety    History of osteoporosis    Kidney calculi 07/2015   Kidney stone    PONV (postoperative nausea and vomiting) 02-23-12   always has PONV   Post-nasal drainage 02-23-12   frequent a problem,causes a cough and mucus buildup in throat   Urethral polyp 1998   Dr. Hartley Barefoot   Varicose veins    Venous insufficiency    chronic-denies any problems   Past Surgical History:  Procedure Laterality Date   blood clot removed Left 01/2012   blood clot removed from left hand    BREAST BIOPSY Right 02/14/2012   Fibroadenoma   COLONOSCOPY  08/2003   repeat 5 yrs.   COLONOSCOPY W/ BIOPSIES  12/2008   1 polyp recheck 5 yrs   EXCISION/RELEASE BURSA HIP  02/29/2012   HAND SURGERY  02/23/2012   "blood clot" excised palm left hand 01-18-12   KNEE ARTHROSCOPY W/ MENISCAL REPAIR  10/2012   KNEE ARTHROSCOPY WITH EXCISION BAKER'S CYST Left 10/2012   meniscal repair   ORIF METATARSAL FRACTURE Left 04/07/2017   Left 5th Metatarsal   SHOULDER SURGERY Right 02/2015   Dr. 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   Patient Active Problem List   Diagnosis Date Noted   IBS (irritable bowel syndrome with diarrhea) 08/17/2022   Hearing aid worn 02/10/2022   Frequent falls 11/26/2020   Depression with anxiety    History of osteoporosis    Age-related osteoporosis without current pathological fracture 07/29/2020   Osteoporosis 07/27/2020   Rhinitis 03/18/2020   Seborrheic keratosis 11/23/2017   S/P TKR (total knee replacement), left 08/25/2016   Benign paroxysmal positional vertigo 08/25/2016   Mid back pain, chronic 12/16/2015   Cervical strain 11/21/2014   DDD (degenerative disc disease), lumbar 02/17/2014   Vitamin D deficiency 03/18/2013   Trochanteric bursitis of right hip 02/29/2012   INSOMNIA 04/13/2010   Hyperlipidemia 09/17/2007   CERVICAL POLYP 09/17/2007   Attention deficit disorder 09/11/2007   UNSPECIFIED VENOUS INSUFFICIENCY 08/29/2007    PCP: Beatrice Lecher  REFERRING PROVIDER: Lemont Fillers DIAG: Z47.1: Aftercare following joint replacement surgery; [phone] presence of left artificial knee joint  THERAPY DIAG:  Chronic pain of left knee  Muscle weakness (generalized)  Difficulty in walking, not elsewhere classified  Chronic bilateral  low back pain without sciatica  Repeated falls  Rationale for Evaluation and Treatment: Rehabilitation  ONSET DATE: ~4 years ago  SUBJECTIVE:   SUBJECTIVE STATEMENT: Pt reports she is frustrated that her Lt knee is still painful.    PERTINENT HISTORY: L TKA 4 years ago, L hip surgery, hx of L baker's cyst (removed) From eval: Pt reports L knee pain. Pt had L TKA 4 years ago. Pt had scar tissue removed May 24, 2022 but still getting pain. Pt has been going to pool and doing exercises. Pt has been walking. Has been using voltaren on her knee.   PAIN:  Are you having pain? Yes: NPRS scale: 0 currently/10 Pain location:  anterior knee Pain description: Can be sharp, achy and dull Aggravating factors: Sit to stand, stairs, and getting out of bed; fatigue Relieving factors: Water therapy  PRECAUTIONS: None  WEIGHT BEARING RESTRICTIONS: No  FALLS:  Has patient fallen in last 6 months? Yes. Number of falls 5 or 6 times (normally to the left), last fall in October  OCCUPATION: Teaches art 2 days/wk  PATIENT GOALS: Improve knee pain with mobility  NEXT MD VISIT: n/a  OBJECTIVE:  From eval unless otherwise noted  PALPATION: TTP L anterior gastroc, patellar tendon, medial and lateral knee joint line  LOWER EXTREMITY ROM:  Active ROM Right eval Left eval  Knee flexion 120 115  Knee extension 0 0   (Blank rows = not tested)  LOWER EXTREMITY MMT:  MMT Right eval Left eval  Hip flexion 5 3+  Hip extension 3 3  Hip abduction 4- 3  Hip adduction 5 4-  Hip internal rotation    Hip external rotation    Knee flexion 5 5  Knee extension 5 5  Ankle dorsiflexion 5 5  Ankle plantarflexion 5 5  Ankle inversion 5 4  Ankle eversion 5 5   (Blank rows = not tested)  LOWER EXTREMITY SPECIAL TESTS:  Knee special tests: Patellafemoral apprehension test: positive   FUNCTIONAL TESTS:  SLS on R 4 sec, on L attempts but unable to hold R LE back tandem 30 sec, L LE back tandem 30 sec Painful sit to stand and squatting with increased L knee valgus noted Unable to perform stairs with reciprocal pattern -- L knee valgus noted with ascent and descent  GAIT: Distance walked: 150 Assistive device utilized: None Level of assistance: Complete Independence Comments: antalgic, decreased stance on L, trendelenburg pattern  [facility] Adult PT Treatment:                                                DATE: 09/09/22 Pt seen for aquatic therapy today.  Treatment took place in water 3.25-4.5 ft in depth at the [facility]. Temp of water was 91.  Pt entered/exited the pool via stairs with step-to with  bilat rail.  * warm up forward/ backward walking and side stepping *UE support with yellow hand floats:  SLS with SL swings in frontal and sagittal planes x 10 each; bilat heel raises x 15, unilateral heel raises x 10 each * holding single yellow hand float at side  * back against wall with single leg press with solid noodle * side stepping L/R with light hydro band above knees * band at knees with STS on 3rd step (cues to push LE into band for neutral  lower leg) x 5; then 5 reps without band * L forward step ups x 10, eccentric lowering * L lateral step ups x 10, eccentric lowering down  Pt requires the buoyancy and hydrostatic pressure of water for support, and to offload joints by unweighting joint load by at least 50 % in navel deep water and by at least 75-80% in chest to neck deep water.  Viscosity of the water is needed for resistance of strengthening. Water current perturbations provides challenge to standing balance requiring increased core activation.   [facility] Adult PT Treatment:                                                DATE: 09/08/22 Therapeutic Exercise: Nustep L6 x 5 min LEs only Sitting Hamstring 2x30 sec Adductor 2x30 sec Hip flexor stretch x30 sec Hip IR/ER red TB 2x10 Prone Quad stretch with strap 2x30 sec hurt pt's knee Attempted hip ER but painful to pts knee Standing Hip adduction with slider 2x10 Hip abd yellow TB 2x10 Hip ext yellow TB 2x10 Manual Therapy: IASTM, STM & TPR quad and adductor    [facility] Adult PT Treatment:                                                DATE: 09/01/22 Therapeutic Exercise: NuStep L6 x 16mn Hamstring stretch with strap 2 x30 sec Seated L quad stretch 2x30 sec Leg press (P4, YTB around knees) DL 90# 3x10; L SL 64# 2x6 Standing B step out YTB 2x10 Bent forward hip ext YTB 2x10 Sit<>stand 2x5 focus on decreasing knee valgus and performing hip hinge  Tandem stance balance 3x30" Therapeutic Activity: Ambulating with focus on  heel strike Tandem stance balance 3x30" B Instruction on using foam roller to address tension in     PATIENT EDUCATION:  Education details: aquatic progressions/modifications Person educated: Patient Education method: Explanation, Demonstration, and Handouts Education comprehension: verbalized understanding, returned demonstration, and needs further education  HOME EXERCISE PROGRAM: Access Code: [email or web address] URL: [email or web address] Date: 08/24/2022 Prepared by: GEstill BambergApril MThurnell Garbe Exercises - Hip Abduction with Resistance Loop  - 1 x daily - 7 x weekly - 2 sets - 10 reps - Hip Extension with Resistance Loop  - 1 x daily - 7 x weekly - 2 sets - 10 reps Access Code: [email or web address] URL: [email or web address] Date: 09/01/2022 Prepared by: KHelane Gunther Exercises - Hip Abduction with Resistance Loop  - 1 x daily - 7 x weekly - 2 sets - 10 reps - Hip Extension with Resistance Loop  - 1 x daily - 7 x weekly - 2 sets - 10 reps - Seated Piriformis Stretch  - 1 x daily - 7 x weekly - 3 sets - 3 reps - 30 sec hold - Sit to Stand with Armchair  - 1 x daily - 7 x weekly - 3 sets - 10 reps - Tandem Stance with Chair Support  - 1 x daily - 7 x weekly - 1 sets - 3 reps - 30 sec hold  Patient Education - Patellofemoral Pain   ASSESSMENT:  CLINICAL IMPRESSION: Pt had two brief episodes of "popping" in Lt knee, but pain resolved instantly.  She tolerated aquatic exercises  well.  Minor cues given for technique and posture.  Will plan to update her aquatic HEP from previous visit and issue at later date.  Pt progressing towards goals.   OBJECTIVE IMPAIRMENTS: Abnormal gait, decreased activity tolerance, decreased balance, decreased endurance, decreased mobility, difficulty walking, decreased ROM, decreased strength, increased fascial restrictions, increased muscle spasms, impaired sensation, improper body mechanics, and pain.    GOALS: Goals reviewed with  patient? Yes  SHORT TERM GOALS: Target date: 09/21/2022   Pt will be ind with initial HEP Baseline: Goal status: IN PROGRESS    LONG TERM GOALS: Target date: 10/19/2022   Pt will be ind with advancement and progression of HEP Baseline:  Goal status: IN PROGRESS  2.  Pt will report improved pain by at least 50% with stair ascent/descent Baseline:  Goal status: IN PROGRESS  3.  Pt will be able to perform 5x STS without UE support to demo improved functional LE strength Baseline:  Goal status: IN PROGRESS  4.  Pt will demo improved body mechanics with less knee valgus during lifting Baseline:  Goal status: IN PROGRESS  5.  Pt will have improved FOTO score to >/= 67 Baseline:  Goal status: IN PROGRESS    PLAN:  PT FREQUENCY: 2x/week  PT DURATION: 8 weeks  PLANNED INTERVENTIONS: Therapeutic exercises, Therapeutic activity, Neuromuscular re-education, Balance training, Gait training, Patient/Family education, Self Care, Joint mobilization, Stair training, Orthotic/Fit training, Aquatic Therapy, Dry Needling, Electrical stimulation, Cryotherapy, Moist heat, Taping, Vasopneumatic device, Ultrasound, Ionotophoresis '4mg'$ /ml Dexamethasone, Manual therapy, and Re-evaluation  PLAN FOR NEXT SESSION: Progress HEP as tolerated.Continue glute strengthening. Work on Economist with stairs and lifting. Manual work as indicated  [employer], PTA 09/09/22 12:57 PM [place] [address] [place], [place], [address] Phone: [phone]   Fax:  [phone]

## 2022-09-14 ENCOUNTER — Ambulatory Visit: Payer: Medicare Other | Admitting: Physical Therapy

## 2022-09-14 ENCOUNTER — Ambulatory Visit (HOSPITAL_BASED_OUTPATIENT_CLINIC_OR_DEPARTMENT_OTHER): Payer: Self-pay | Admitting: Physical Therapy

## 2022-09-14 ENCOUNTER — Encounter: Payer: Self-pay | Admitting: Physical Therapy

## 2022-09-14 DIAGNOSIS — R262 Difficulty in walking, not elsewhere classified: Secondary | ICD-10-CM

## 2022-09-14 DIAGNOSIS — M6281 Muscle weakness (generalized): Secondary | ICD-10-CM

## 2022-09-14 DIAGNOSIS — M62838 Other muscle spasm: Secondary | ICD-10-CM | POA: Diagnosis not present

## 2022-09-14 DIAGNOSIS — R269 Unspecified abnormalities of gait and mobility: Secondary | ICD-10-CM | POA: Diagnosis not present

## 2022-09-14 DIAGNOSIS — Z96652 Presence of left artificial knee joint: Secondary | ICD-10-CM | POA: Diagnosis not present

## 2022-09-14 DIAGNOSIS — M25562 Pain in left knee: Secondary | ICD-10-CM | POA: Diagnosis not present

## 2022-09-14 DIAGNOSIS — G8929 Other chronic pain: Secondary | ICD-10-CM

## 2022-09-14 DIAGNOSIS — Z471 Aftercare following joint replacement surgery: Secondary | ICD-10-CM | POA: Diagnosis not present

## 2022-09-14 NOTE — Therapy (Signed)
OUTPATIENT PHYSICAL THERAPY TREATMENT   Patient Name: Wanda Gonzales MRN: [id number] DOB:[birth year], 66 y.o., female Today's Date: 09/14/2022  END OF SESSION:  PT End of Session - 09/14/22 1356     Visit Number 6    Number of Visits 16    Date for PT Re-Evaluation 10/18/22    Authorization Type BCBS Medicare    Progress Note Due on Visit 10    PT Start Time 1356    PT Stop Time 1440    PT Time Calculation (min) 44 min    Activity Tolerance Patient tolerated treatment well    Behavior During Therapy WFL for tasks assessed/performed                Past Medical History:  Diagnosis Date   Abnormal Pap smear, atypical squamous cells of undetermined sign (ASC-US) 1998   treated with cryo, CIN I   ADD (attention deficit disorder) 02-23-12   easily distracted.   Arthritis 02-23-12   Rt. hip and lt. arm   Closed displaced fracture of fifth metatarsal bone of left foot 03/31/2017   Depression with anxiety    History of osteoporosis    Kidney calculi 07/2015   Kidney stone    PONV (postoperative nausea and vomiting) 02-23-12   always has PONV   Post-nasal drainage 02-23-12   frequent a problem,causes a cough and mucus buildup in throat   Urethral polyp 1998   Dr. Hartley Barefoot   Varicose veins    Venous insufficiency    chronic-denies any problems   Past Surgical History:  Procedure Laterality Date   blood clot removed Left 01/2012   blood clot removed from left hand    BREAST BIOPSY Right 02/14/2012   Fibroadenoma   COLONOSCOPY  08/2003   repeat 5 yrs.   COLONOSCOPY W/ BIOPSIES  12/2008   1 polyp recheck 5 yrs   EXCISION/RELEASE BURSA HIP  02/29/2012   HAND SURGERY  02/23/2012   "blood clot" excised palm left hand 01-18-12   KNEE ARTHROSCOPY W/ MENISCAL REPAIR  10/2012   KNEE ARTHROSCOPY WITH EXCISION BAKER'S CYST Left 10/2012   meniscal repair   ORIF METATARSAL FRACTURE Left 04/07/2017   Left 5th Metatarsal   SHOULDER SURGERY Right 02/2015   Dr. 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   Patient Active Problem List   Diagnosis Date Noted   IBS (irritable bowel syndrome with diarrhea) 08/17/2022   Hearing aid worn 02/10/2022   Frequent falls 11/26/2020   Depression with anxiety    History of osteoporosis    Age-related osteoporosis without current pathological fracture 07/29/2020   Osteoporosis 07/27/2020   Rhinitis 03/18/2020   Seborrheic keratosis 11/23/2017   S/P TKR (total knee replacement), left 08/25/2016   Benign paroxysmal positional vertigo 08/25/2016   Mid back pain, chronic 12/16/2015   Cervical strain 11/21/2014   DDD (degenerative disc disease), lumbar 02/17/2014   Vitamin D deficiency 03/18/2013   Trochanteric bursitis of right hip 02/29/2012   INSOMNIA 04/13/2010   Hyperlipidemia 09/17/2007   CERVICAL POLYP 09/17/2007   Attention deficit disorder 09/11/2007   UNSPECIFIED VENOUS INSUFFICIENCY 08/29/2007    PCP: Beatrice Lecher  REFERRING PROVIDER: Lemont Fillers DIAG: Z47.1: Aftercare following joint replacement surgery; [phone] presence of left artificial knee joint  THERAPY DIAG:  Chronic pain of left knee  Muscle weakness (generalized)  Difficulty in walking, not elsewhere classified  Rationale  for Evaluation and Treatment: Rehabilitation  ONSET DATE: ~4 years ago  SUBJECTIVE:   SUBJECTIVE STATEMENT: Pt states she had a good session in the pool. Her hips were a little sore after last session.   PERTINENT HISTORY: L TKA 4 years ago, L hip surgery, hx of L baker's cyst (removed) From eval: Pt reports L knee pain. Pt had L TKA 4 years ago. Pt had scar tissue removed May 24, 2022 but still getting pain. Pt has been going to pool and doing exercises. Pt has been walking. Has been using voltaren on her knee.   PAIN:  Are you having pain? Yes: NPRS scale: 0 currently/10 Pain location: anterior knee Pain description: Can  be sharp, achy and dull Aggravating factors: Sit to stand, stairs, and getting out of bed; fatigue Relieving factors: Water therapy  PRECAUTIONS: None  WEIGHT BEARING RESTRICTIONS: No  FALLS:  Has patient fallen in last 6 months? Yes. Number of falls 5 or 6 times (normally to the left), last fall in October  OCCUPATION: Teaches art 2 days/wk  PATIENT GOALS: Improve knee pain with mobility  NEXT MD VISIT: n/a  OBJECTIVE:  From eval unless otherwise noted  PALPATION: TTP L anterior gastroc, patellar tendon, medial and lateral knee joint line  LOWER EXTREMITY ROM:  Active ROM Right eval Left eval  Knee flexion 120 115  Knee extension 0 0   (Blank rows = not tested)  LOWER EXTREMITY MMT:  MMT Right eval Left eval  Hip flexion 5 3+  Hip extension 3 3  Hip abduction 4- 3  Hip adduction 5 4-  Hip internal rotation    Hip external rotation    Knee flexion 5 5  Knee extension 5 5  Ankle dorsiflexion 5 5  Ankle plantarflexion 5 5  Ankle inversion 5 4  Ankle eversion 5 5   (Blank rows = not tested)  LOWER EXTREMITY SPECIAL TESTS:  Knee special tests: Patellafemoral apprehension test: positive   FUNCTIONAL TESTS:  SLS on R 4 sec, on L attempts but unable to hold R LE back tandem 30 sec, L LE back tandem 30 sec Painful sit to stand and squatting with increased L knee valgus noted Unable to perform stairs with reciprocal pattern -- L knee valgus noted with ascent and descent  GAIT: Distance walked: 150 Assistive device utilized: None Level of assistance: Complete Independence Comments: antalgic, decreased stance on L, trendelenburg pattern  [facility] Adult PT Treatment:                                                DATE: 09/14/22 Therapeutic Exercise: Nustep L5-6 x 5 min LEs only Standing Quad stretch with strap 2x30 sec R&L Hamstring stretch 2x30 sec R&L Gastroc stretch 2x30 sec bilat 4" step side step x10 4" step forward step down 2x10 R&L Sidelying Hip abd  2x10 L Hip add 2x10 L Supine Piriformis stretch x30 sec Prone Hip ext 2x10 R&L Leg press 65# 2x10 DL,  Manual Therapy: STM & TPR L quad    [facility] Adult PT Treatment:                                                DATE: 09/09/22 Pt seen  for aquatic therapy today.  Treatment took place in water 3.25-4.5 ft in depth at the [facility]. Temp of water was 91.  Pt entered/exited the pool via stairs with step-to with bilat rail.  * warm up forward/ backward walking and side stepping *UE support with yellow hand floats:  SLS with SL swings in frontal and sagittal planes x 10 each; bilat heel raises x 15, unilateral heel raises x 10 each * holding single yellow hand float at side  * back against wall with single leg press with solid noodle * side stepping L/R with light hydro band above knees * band at knees with STS on 3rd step (cues to push LE into band for neutral lower leg) x 5; then 5 reps without band * L forward step ups x 10, eccentric lowering * L lateral step ups x 10, eccentric lowering down  Pt requires the buoyancy and hydrostatic pressure of water for support, and to offload joints by unweighting joint load by at least 50 % in navel deep water and by at least 75-80% in chest to neck deep water.  Viscosity of the water is needed for resistance of strengthening. Water current perturbations provides challenge to standing balance requiring increased core activation.   [facility] Adult PT Treatment:                                                DATE: 09/08/22 Therapeutic Exercise: Nustep L6 x 5 min LEs only Sitting Hamstring 2x30 sec Adductor 2x30 sec Hip flexor stretch x30 sec Hip IR/ER red TB 2x10 Prone Quad stretch with strap 2x30 sec hurt pt's knee Attempted hip ER but painful to pts knee Standing Hip adduction with slider 2x10 Hip abd yellow TB 2x10 Hip ext yellow TB 2x10 Manual Therapy: IASTM, STM & TPR quad and adductor    PATIENT EDUCATION:  Education  details: Trial of TENS on L quad to decrease trigger points/tightness Person educated: Patient Education method: Explanation, Demonstration, and Handouts Education comprehension: verbalized understanding, returned demonstration, and needs further education  HOME EXERCISE PROGRAM: Access Code: [email or web address] URL: [email or web address] Date: 09/14/2022 Prepared by: Estill Bamberg April Thurnell Garbe  Exercises - Hip Abduction with Resistance Loop  - 1 x daily - 7 x weekly - 2 sets - 10 reps - Hip Extension with Resistance Loop  - 1 x daily - 7 x weekly - 2 sets - 10 reps - Seated Piriformis Stretch  - 1 x daily - 7 x weekly - 3 sets - 3 reps - 30 sec hold - Sit to Stand with Armchair  - 1 x daily - 7 x weekly - 3 sets - 10 reps - Tandem Stance with Chair Support  - 1 x daily - 7 x weekly - 1 sets - 3 reps - 30 sec hold - Forward Step Down Touch with Heel  - 1 x daily - 7 x weekly - 2 sets - 10 reps   ASSESSMENT:  CLINICAL IMPRESSION: Increased medial knee pain with hip abd >5 reps. Worked on improving knee valgus with mobility and increasing glute strength on stairs. Initiated work on leg press -- trialed 90# but pt unable to tolerate. Felt better with 65#.   OBJECTIVE IMPAIRMENTS: Abnormal gait, decreased activity tolerance, decreased balance, decreased endurance, decreased mobility, difficulty walking, decreased ROM, decreased strength, increased fascial restrictions, increased muscle spasms,  impaired sensation, improper body mechanics, and pain.    GOALS: Goals reviewed with patient? Yes  SHORT TERM GOALS: Target date: 09/21/2022   Pt will be ind with initial HEP Baseline: Goal status: IN PROGRESS    LONG TERM GOALS: Target date: 10/19/2022   Pt will be ind with advancement and progression of HEP Baseline:  Goal status: IN PROGRESS  2.  Pt will report improved pain by at least 50% with stair ascent/descent Baseline:  Goal status: IN PROGRESS  3.  Pt will be able to  perform 5x STS without UE support to demo improved functional LE strength Baseline:  Goal status: IN PROGRESS  4.  Pt will demo improved body mechanics with less knee valgus during lifting Baseline:  Goal status: IN PROGRESS  5.  Pt will have improved FOTO score to >/= 67 Baseline:  Goal status: IN PROGRESS    PLAN:  PT FREQUENCY: 2x/week  PT DURATION: 8 weeks  PLANNED INTERVENTIONS: Therapeutic exercises, Therapeutic activity, Neuromuscular re-education, Balance training, Gait training, Patient/Family education, Self Care, Joint mobilization, Stair training, Orthotic/Fit training, Aquatic Therapy, Dry Needling, Electrical stimulation, Cryotherapy, Moist heat, Taping, Vasopneumatic device, Ultrasound, Ionotophoresis '4mg'$ /ml Dexamethasone, Manual therapy, and Re-evaluation  PLAN FOR NEXT SESSION: Progress HEP as tolerated.Continue glute strengthening. Work on Economist with stairs and lifting. Manual work as indicated  [employer] April Beatrix Shipper [place], [place] 09/14/22 1:56 PM

## 2022-09-16 ENCOUNTER — Ambulatory Visit (HOSPITAL_BASED_OUTPATIENT_CLINIC_OR_DEPARTMENT_OTHER): Payer: Medicare Other | Admitting: Physical Therapy

## 2022-09-16 ENCOUNTER — Encounter (HOSPITAL_BASED_OUTPATIENT_CLINIC_OR_DEPARTMENT_OTHER): Payer: Self-pay | Admitting: Physical Therapy

## 2022-09-16 DIAGNOSIS — R262 Difficulty in walking, not elsewhere classified: Secondary | ICD-10-CM | POA: Diagnosis not present

## 2022-09-16 DIAGNOSIS — G8929 Other chronic pain: Secondary | ICD-10-CM | POA: Diagnosis not present

## 2022-09-16 DIAGNOSIS — Z96652 Presence of left artificial knee joint: Secondary | ICD-10-CM | POA: Diagnosis not present

## 2022-09-16 DIAGNOSIS — M6281 Muscle weakness (generalized): Secondary | ICD-10-CM

## 2022-09-16 DIAGNOSIS — Z471 Aftercare following joint replacement surgery: Secondary | ICD-10-CM | POA: Diagnosis not present

## 2022-09-16 NOTE — Therapy (Signed)
OUTPATIENT PHYSICAL THERAPY TREATMENT   Patient Name: Wanda Gonzales MRN: [id number] DOB:04/23/1956, 66 y.o., female Today's Date: 09/16/2022  END OF SESSION:  PT End of Session - 09/16/22 0905     Visit Number 7    Number of Visits 16    Date for PT Re-Evaluation 10/18/22    Authorization Type BCBS Medicare    Progress Note Due on Visit 10    PT Start Time 0901    PT Stop Time 0942    PT Time Calculation (min) 41 min    Behavior During Therapy [facility] for tasks assessed/performed                Past Medical History:  Diagnosis Date   Abnormal Pap smear, atypical squamous cells of undetermined sign (ASC-US) 1998   treated with cryo, CIN I   ADD (attention deficit disorder) 02-23-12   easily distracted.   Arthritis 02-23-12   Rt. hip and lt. arm   Closed displaced fracture of fifth metatarsal bone of left foot 03/31/2017   Depression with anxiety    History of osteoporosis    Kidney calculi 07/2015   Kidney stone    PONV (postoperative nausea and vomiting) 02-23-12   always has PONV   Post-nasal drainage 02-23-12   frequent a problem,causes a cough and mucus buildup in throat   Urethral polyp 1998   Dr. Hartley Barefoot   Varicose veins    Venous insufficiency    chronic-denies any problems   Past Surgical History:  Procedure Laterality Date   blood clot removed Left 01/2012   blood clot removed from left hand    BREAST BIOPSY Right 02/14/2012   Fibroadenoma   COLONOSCOPY  08/2003   repeat 5 yrs.   COLONOSCOPY W/ BIOPSIES  12/2008   1 polyp recheck 5 yrs   EXCISION/RELEASE BURSA HIP  02/29/2012   HAND SURGERY  02/23/2012   "blood clot" excised palm left hand 01-18-12   KNEE ARTHROSCOPY W/ MENISCAL REPAIR  10/2012   KNEE ARTHROSCOPY WITH EXCISION BAKER'S CYST Left 10/2012   meniscal repair   ORIF METATARSAL FRACTURE Left 04/07/2017   Left 5th Metatarsal   SHOULDER SURGERY Right 02/2015   Dr. 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   Patient Active Problem List   Diagnosis Date Noted   IBS (irritable bowel syndrome with diarrhea) 08/17/2022   Hearing aid worn 02/10/2022   Frequent falls 11/26/2020   Depression with anxiety    History of osteoporosis    Age-related osteoporosis without current pathological fracture 07/29/2020   Osteoporosis 07/27/2020   Rhinitis 03/18/2020   Seborrheic keratosis 11/23/2017   S/P TKR (total knee replacement), left 08/25/2016   Benign paroxysmal positional vertigo 08/25/2016   Mid back pain, chronic 12/16/2015   Cervical strain 11/21/2014   DDD (degenerative disc disease), lumbar 02/17/2014   Vitamin D deficiency 03/18/2013   Trochanteric bursitis of right hip 02/29/2012   INSOMNIA 04/13/2010   Hyperlipidemia 09/17/2007   CERVICAL POLYP 09/17/2007   Attention deficit disorder 09/11/2007   UNSPECIFIED VENOUS INSUFFICIENCY 08/29/2007    PCP: Beatrice Lecher  REFERRING PROVIDER: Lemont Fillers DIAG: Z47.1: Aftercare following joint replacement surgery; [phone] presence of left artificial knee joint  THERAPY DIAG:  Chronic pain of left knee  Muscle weakness (generalized)  Difficulty in walking, not elsewhere classified  Rationale for Evaluation and Treatment: Rehabilitation  ONSET DATE: ~4  years ago  SUBJECTIVE:   SUBJECTIVE STATEMENT: Pt voices frustration with the continued pain in her Lt knee.  She reports that her Lt hip has been sore too.  Her Lt knee woke her up last night.   PERTINENT HISTORY: L TKA 4 years ago, L hip surgery, hx of L baker's cyst (removed) From eval: Pt reports L knee pain. Pt had L TKA 4 years ago. Pt had scar tissue removed May 24, 2022 but still getting pain. Pt has been going to pool and doing exercises. Pt has been walking. Has been using voltaren on her knee.   PAIN:  Are you having pain? Yes: NPRS scale: 2-3/10 Pain location: anterior knee Pain description: Can be  sharp, achy and dull Aggravating factors: Sit to stand, stairs, and getting out of bed; fatigue Relieving factors: Water therapy  PRECAUTIONS: None  WEIGHT BEARING RESTRICTIONS: No  FALLS:  Has patient fallen in last 6 months? Yes. Number of falls 5 or 6 times (normally to the left), last fall in October  OCCUPATION: Teaches art 2 days/wk  PATIENT GOALS: Improve knee pain with mobility  NEXT MD VISIT: n/a  OBJECTIVE:  From eval unless otherwise noted  PALPATION: TTP L anterior gastroc, patellar tendon, medial and lateral knee joint line  LOWER EXTREMITY ROM:  Active ROM Right eval Left eval  Knee flexion 120 115  Knee extension 0 0   (Blank rows = not tested)  LOWER EXTREMITY MMT:  MMT Right eval Left eval  Hip flexion 5 3+  Hip extension 3 3  Hip abduction 4- 3  Hip adduction 5 4-  Hip internal rotation    Hip external rotation    Knee flexion 5 5  Knee extension 5 5  Ankle dorsiflexion 5 5  Ankle plantarflexion 5 5  Ankle inversion 5 4  Ankle eversion 5 5   (Blank rows = not tested)  LOWER EXTREMITY SPECIAL TESTS:  Knee special tests: Patellafemoral apprehension test: positive   FUNCTIONAL TESTS:  SLS on R 4 sec, on L attempts but unable to hold R LE back tandem 30 sec, L LE back tandem 30 sec Painful sit to stand and squatting with increased L knee valgus noted Unable to perform stairs with reciprocal pattern -- L knee valgus noted with ascent and descent  GAIT: Distance walked: 150 Assistive device utilized: None Level of assistance: Complete Independence Comments: antalgic, decreased stance on L, trendelenburg pattern  [facility] Adult PT Treatment:                                                DATE: 09/16/22 Pt seen for aquatic therapy today.  Treatment took place in water 3.25-4.5 ft in depth at the [facility]. Temp of water was 91.  Pt entered/exited the pool via stairs with step-to with bilat rail.  * warm up forward/ backward  walking and side stepping * single yellow hand float under water at side, walking forward/ backward with core engaged * UE support with yellow hand floats:  bilat heel raises x 15, unilateral heel raises x 10 each; SLS with leg swings front/back x 10, hip abdct/ addct x 10 x 2 * back against wall with single leg press with thin square noodle x 20 LLE, x 10 RLE (progressed from back on wall to single UE on wall) * L forward  step ups x 10, eccentric lowering * fig4 stretch holding rails x 20x x 2 LLE, x 1 RLE * foot on 2nd step - forward lunge for L knee ROM/ back for hamstring stretch x 10  * Lt step ups on 2nd step x 4 reps with heavy UE assist, cues for knee alignment * straddling yellow noodle:  cycling, cc ski, jumping jack LE with LE suspended * after dried off: applied reg Rock tape in horseshoe pattern to Lt ant knee for decompression of tissue and increased proprioception  Pt requires the buoyancy and hydrostatic pressure of water for support, and to offload joints by unweighting joint load by at least 50 % in navel deep water and by at least 75-80% in chest to neck deep water.  Viscosity of the water is needed for resistance of strengthening. Water current perturbations provides challenge to standing balance requiring increased core activation. [facility] Adult PT Treatment:                                                DATE: 09/14/22 Therapeutic Exercise: Nustep L5-6 x 5 min LEs only Standing Quad stretch with strap 2x30 sec R&L Hamstring stretch 2x30 sec R&L Gastroc stretch 2x30 sec bilat 4" step side step x10 4" step forward step down 2x10 R&L Sidelying Hip abd 2x10 L Hip add 2x10 L Supine Piriformis stretch x30 sec Prone Hip ext 2x10 R&L Leg press 65# 2x10 DL,  Manual Therapy: STM & TPR L quad    [facility] Adult PT Treatment:                                                DATE: 09/09/22 Pt seen for aquatic therapy today.  Treatment took place in water 3.25-4.5 ft in depth at the  [place]. Temp of water was 91.  Pt entered/exited the pool via stairs with step-to with bilat rail.  * warm up forward/ backward walking and side stepping *UE support with yellow hand floats:  SLS with SL swings in frontal and sagittal planes x 10 each; bilat heel raises x 15, unilateral heel raises x 10 each * holding single yellow hand float at side  * back against wall with single leg press with solid noodle * side stepping L/R with light hydro band above knees * band at knees with STS on 3rd step (cues to push LE into band for neutral lower leg) x 5; then 5 reps without band * L forward step ups x 10, eccentric lowering * L lateral step ups x 10, eccentric lowering down  Pt requires the buoyancy and hydrostatic pressure of water for support, and to offload joints by unweighting joint load by at least 50 % in navel deep water and by at least 75-80% in chest to neck deep water.  Viscosity of the water is needed for resistance of strengthening. Water current perturbations provides challenge to standing balance requiring increased core activation.   [facility] Adult PT Treatment:  DATE: 09/08/22 Therapeutic Exercise: Nustep L6 x 5 min LEs only Sitting Hamstring 2x30 sec Adductor 2x30 sec Hip flexor stretch x30 sec Hip IR/ER red TB 2x10 Prone Quad stretch with strap 2x30 sec hurt pt's knee Attempted hip ER but painful to pts knee Standing Hip adduction with slider 2x10 Hip abd yellow TB 2x10 Hip ext yellow TB 2x10 Manual Therapy: IASTM, STM & TPR quad and adductor    PATIENT EDUCATION:  Education details: Trial of TENS on L quad to decrease trigger points/tightness Person educated: Patient Education method: Explanation, Demonstration, and Handouts Education comprehension: verbalized understanding, returned demonstration, and needs further education  HOME EXERCISE PROGRAM: Access Code: [email or web address] URL:  [email or web address] Date: 09/14/2022 Prepared by: Estill Bamberg April Thurnell Garbe  Exercises - Hip Abduction with Resistance Loop  - 1 x daily - 7 x weekly - 2 sets - 10 reps - Hip Extension with Resistance Loop  - 1 x daily - 7 x weekly - 2 sets - 10 reps - Seated Piriformis Stretch  - 1 x daily - 7 x weekly - 3 sets - 3 reps - 30 sec hold - Sit to Stand with Armchair  - 1 x daily - 7 x weekly - 3 sets - 10 reps - Tandem Stance with Chair Support  - 1 x daily - 7 x weekly - 1 sets - 3 reps - 30 sec hold - Forward Step Down Touch with Heel  - 1 x daily - 7 x weekly - 2 sets - 10 reps   ASSESSMENT:  CLINICAL IMPRESSION: Pt reported she was pain free during session - except brief episode of Lt hip pain with hip ext during leg swings and Lt ant knee pain with step ups (after completing 10 reps).  Lt hip and knee remain weaker than RLE.  Encouraged pt to continue loading LLE and stretching Lt hip/ knee outside of therapy sessions.     OBJECTIVE IMPAIRMENTS: Abnormal gait, decreased activity tolerance, decreased balance, decreased endurance, decreased mobility, difficulty walking, decreased ROM, decreased strength, increased fascial restrictions, increased muscle spasms, impaired sensation, improper body mechanics, and pain.    GOALS: Goals reviewed with patient? Yes  SHORT TERM GOALS: Target date: 09/21/2022   Pt will be ind with initial HEP Baseline: Goal status: IN PROGRESS    LONG TERM GOALS: Target date: 10/19/2022   Pt will be ind with advancement and progression of HEP Baseline:  Goal status: IN PROGRESS  2.  Pt will report improved pain by at least 50% with stair ascent/descent Baseline:  Goal status: IN PROGRESS  3.  Pt will be able to perform 5x STS without UE support to demo improved functional LE strength Baseline:  Goal status: IN PROGRESS  4.  Pt will demo improved body mechanics with less knee valgus during lifting Baseline:  Goal status: IN  PROGRESS  5.  Pt will have improved FOTO score to >/= 67 Baseline:  Goal status: IN PROGRESS    PLAN:  PT FREQUENCY: 2x/week  PT DURATION: 8 weeks  PLANNED INTERVENTIONS: Therapeutic exercises, Therapeutic activity, Neuromuscular re-education, Balance training, Gait training, Patient/Family education, Self Care, Joint mobilization, Stair training, Orthotic/Fit training, Aquatic Therapy, Dry Needling, Electrical stimulation, Cryotherapy, Moist heat, Taping, Vasopneumatic device, Ultrasound, Ionotophoresis '4mg'$ /ml Dexamethasone, Manual therapy, and Re-evaluation  PLAN FOR NEXT SESSION: Progress HEP as tolerated.Continue glute strengthening. Work on Economist with stairs and lifting. Manual work as indicated  [employer], PTA 09/16/22 9:45 AM [place] [phone]  [place], [place], [address] Phone: [phone]   Fax:  [phone]

## 2022-09-21 ENCOUNTER — Ambulatory Visit: Payer: Medicare Other | Attending: Orthopedic Surgery

## 2022-09-21 ENCOUNTER — Other Ambulatory Visit: Payer: Self-pay | Admitting: Family Medicine

## 2022-09-21 DIAGNOSIS — G8929 Other chronic pain: Secondary | ICD-10-CM | POA: Insufficient documentation

## 2022-09-21 DIAGNOSIS — M25562 Pain in left knee: Secondary | ICD-10-CM | POA: Diagnosis not present

## 2022-09-21 DIAGNOSIS — R296 Repeated falls: Secondary | ICD-10-CM | POA: Diagnosis not present

## 2022-09-21 DIAGNOSIS — M6281 Muscle weakness (generalized): Secondary | ICD-10-CM | POA: Diagnosis not present

## 2022-09-21 DIAGNOSIS — R262 Difficulty in walking, not elsewhere classified: Secondary | ICD-10-CM | POA: Diagnosis not present

## 2022-09-21 DIAGNOSIS — Z1231 Encounter for screening mammogram for malignant neoplasm of breast: Secondary | ICD-10-CM

## 2022-09-21 NOTE — Therapy (Signed)
OUTPATIENT PHYSICAL THERAPY TREATMENT   Patient Name: Wanda Gonzales MRN: [id number] DOB:08-May-1956, 67 y.o., female Today's Date: 09/21/2022  END OF SESSION:  PT End of Session - 09/21/22 1401     Visit Number 8    Number of Visits 16    Date for PT Re-Evaluation 10/18/22    PT Start Time 1478    PT Stop Time 1447    PT Time Calculation (min) 45 min    Activity Tolerance Patient tolerated treatment well    Behavior During Therapy [facility] for tasks assessed/performed                Past Medical History:  Diagnosis Date   Abnormal Pap smear, atypical squamous cells of undetermined sign (ASC-US) 1998   treated with cryo, CIN I   ADD (attention deficit disorder) 02-23-12   easily distracted.   Arthritis 02-23-12   Rt. hip and lt. arm   Closed displaced fracture of fifth metatarsal bone of left foot 03/31/2017   Depression with anxiety    History of osteoporosis    Kidney calculi 07/2015   Kidney stone    PONV (postoperative nausea and vomiting) 02-23-12   always has PONV   Post-nasal drainage 02-23-12   frequent a problem,causes a cough and mucus buildup in throat   Urethral polyp 1998   Dr. Hartley Barefoot   Varicose veins    Venous insufficiency    chronic-denies any problems   Past Surgical History:  Procedure Laterality Date   blood clot removed Left 01/2012   blood clot removed from left hand    BREAST BIOPSY Right 02/14/2012   Fibroadenoma   COLONOSCOPY  08/2003   repeat 5 yrs.   COLONOSCOPY W/ BIOPSIES  12/2008   1 polyp recheck 5 yrs   EXCISION/RELEASE BURSA HIP  02/29/2012   HAND SURGERY  02/23/2012   "blood clot" excised palm left hand 01-18-12   KNEE ARTHROSCOPY W/ MENISCAL REPAIR  10/2012   KNEE ARTHROSCOPY WITH EXCISION BAKER'S CYST Left 10/2012   meniscal repair   ORIF METATARSAL FRACTURE Left 04/07/2017   Left 5th Metatarsal   SHOULDER SURGERY Right 02/2015   Dr. Lennette Bihari Supple - [facility]   TOTAL HIP ARTHROPLASTY  age 58   LT, congenital dislocation that  caused advanced arthritis   Patient Active Problem List   Diagnosis Date Noted   IBS (irritable bowel syndrome with diarrhea) 08/17/2022   Hearing aid worn 02/10/2022   Frequent falls 11/26/2020   Depression with anxiety    History of osteoporosis    Age-related osteoporosis without current pathological fracture 07/29/2020   Osteoporosis 07/27/2020   Rhinitis 03/18/2020   Seborrheic keratosis 11/23/2017   S/P TKR (total knee replacement), left 08/25/2016   Benign paroxysmal positional vertigo 08/25/2016   Mid back pain, chronic 12/16/2015   Cervical strain 11/21/2014   DDD (degenerative disc disease), lumbar 02/17/2014   Vitamin D deficiency 03/18/2013   Trochanteric bursitis of right hip 02/29/2012   INSOMNIA 04/13/2010   Hyperlipidemia 09/17/2007   CERVICAL POLYP 09/17/2007   Attention deficit disorder 09/11/2007   UNSPECIFIED VENOUS INSUFFICIENCY 08/29/2007    PCP: Beatrice Lecher  REFERRING PROVIDER: Lemont Fillers DIAG: Z47.1: Aftercare following joint replacement surgery; [phone] presence of left artificial knee joint  THERAPY DIAG:  Chronic pain of left knee  Muscle weakness (generalized)  Difficulty in walking, not elsewhere classified  Repeated falls  Rationale for Evaluation and Treatment: Rehabilitation  ONSET DATE: ~4 years ago  SUBJECTIVE:  SUBJECTIVE STATEMENT: Patient reports she continues to enjoy aquatic therapy sessions, reports mild bilateral hip soreness. Patient states she had mild L medial knee pain when working with bands with HEP. Patient states she continues to have difficulty with sit to stand and knee discomfort with prolonged sitting.  PERTINENT HISTORY: L TKA 4 years ago, L hip surgery, hx of L baker's cyst (removed) From eval: Pt reports L knee pain. Pt had L TKA 4 years ago. Pt had scar tissue removed May 24, 2022 but still getting pain. Pt has been going to pool and doing exercises. Pt has been walking. Has been  using voltaren on her knee.   PAIN:  Are you having pain? Yes: NPRS scale: 1/10 Pain location: anterior knee Pain description: Can be sharp, achy and dull Aggravating factors: Sit to stand, stairs, and getting out of bed; fatigue Relieving factors: Water therapy  PRECAUTIONS: None  WEIGHT BEARING RESTRICTIONS: No  FALLS:  Has patient fallen in last 6 months? Yes. Number of falls 5 or 6 times (normally to the left), last fall in October  OCCUPATION: Teaches art 2 days/wk  PATIENT GOALS: Improve knee pain with mobility  NEXT MD VISIT: n/a  OBJECTIVE:  From eval unless otherwise noted  PALPATION: TTP L anterior gastroc, patellar tendon, medial and lateral knee joint line  LOWER EXTREMITY ROM:  Active ROM Right eval Left eval  Knee flexion 120 115  Knee extension 0 0   (Blank rows = not tested)  LOWER EXTREMITY MMT:  MMT Right eval Left eval  Hip flexion 5 3+  Hip extension 3 3  Hip abduction 4- 3  Hip adduction 5 4-  Hip internal rotation    Hip external rotation    Knee flexion 5 5  Knee extension 5 5  Ankle dorsiflexion 5 5  Ankle plantarflexion 5 5  Ankle inversion 5 4  Ankle eversion 5 5   (Blank rows = not tested)  LOWER EXTREMITY SPECIAL TESTS:  Knee special tests: Patellafemoral apprehension test: positive   FUNCTIONAL TESTS:  SLS on R 4 sec, on L attempts but unable to hold R LE back tandem 30 sec, L LE back tandem 30 sec Painful sit to stand and squatting with increased L knee valgus noted Unable to perform stairs with reciprocal pattern -- L knee valgus noted with ascent and descent  GAIT: Distance walked: 150 Assistive device utilized: None Level of assistance: Complete Independence Comments: antalgic, decreased stance on L, trendelenburg pattern   [facility] Adult PT Treatment:                                                DATE: 09/21/2022 Therapeutic Exercise: Recumbent bike warm up (L3) x 42mn S/L clamshells (discontinued d/t anterior  knee pain) S/L L straight leg glute med leg raises  Passive piriformis stretch L SLR x10 Bent knee fall outs GTB x15 Prone hip ext 2x10 B Prone L quad stretch w/strap 2x30" Seated isometric hip abd/add 10x5" each STS x5 no HHA Leg Press: DL 90# x5, 65# x10; L SL 40# x5   [facility] Adult PT Treatment:  DATE: 09/16/22 Pt seen for aquatic therapy today.  Treatment took place in water 3.25-4.5 ft in depth at the [place]. Temp of water was 91.  Pt entered/exited the pool via stairs with step-to with bilat rail.  * warm up forward/ backward walking and side stepping * single yellow hand float under water at side, walking forward/ backward with core engaged * UE support with yellow hand floats:  bilat heel raises x 15, unilateral heel raises x 10 each; SLS with leg swings front/back x 10, hip abdct/ addct x 10 x 2 * back against wall with single leg press with thin square noodle x 20 LLE, x 10 RLE (progressed from back on wall to single UE on wall) * L forward step ups x 10, eccentric lowering * fig4 stretch holding rails x 20x x 2 LLE, x 1 RLE * foot on 2nd step - forward lunge for L knee ROM/ back for hamstring stretch x 10  * Lt step ups on 2nd step x 4 reps with heavy UE assist, cues for knee alignment * straddling yellow noodle:  cycling, cc ski, jumping jack LE with LE suspended * after dried off: applied reg Rock tape in horseshoe pattern to Lt ant knee for decompression of tissue and increased proprioception  Pt requires the buoyancy and hydrostatic pressure of water for support, and to offload joints by unweighting joint load by at least 50 % in navel deep water and by at least 75-80% in chest to neck deep water.  Viscosity of the water is needed for resistance of strengthening. Water current perturbations provides challenge to standing balance requiring increased core activation.   [facility] Adult PT Treatment:                                                 DATE: 09/14/22 Therapeutic Exercise: Nustep L5-6 x 5 min LEs only Standing Quad stretch with strap 2x30 sec R&L Hamstring stretch 2x30 sec R&L Gastroc stretch 2x30 sec bilat 4" step side step x10 4" step forward step down 2x10 R&L Sidelying Hip abd 2x10 L Hip add 2x10 L Supine Piriformis stretch x30 sec Prone Hip ext 2x10 R&L Leg press 65# 2x10 DL,  Manual Therapy: STM & TPR L quad   PATIENT EDUCATION:  Education details: Discussed pad placement with TENS unit Person educated: Patient Education method: Explanation, Demonstration, and Handouts Education comprehension: verbalized understanding, returned demonstration, and needs further education  HOME EXERCISE PROGRAM: Access Code: [email or web address] URL: [email or web address] Date: 09/14/2022 Prepared by: Estill Bamberg April Thurnell Garbe  Exercises - Hip Abduction with Resistance Loop  - 1 x daily - 7 x weekly - 2 sets - 10 reps - Hip Extension with Resistance Loop  - 1 x daily - 7 x weekly - 2 sets - 10 reps - Seated Piriformis Stretch  - 1 x daily - 7 x weekly - 3 sets - 3 reps - 30 sec hold - Sit to Stand with Armchair  - 1 x daily - 7 x weekly - 3 sets - 10 reps - Tandem Stance with Chair Support  - 1 x daily - 7 x weekly - 1 sets - 3 reps - 30 sec hold - Forward Step Down Touch with Heel  - 1 x daily - 7 x weekly - 2 sets - 10 reps   ASSESSMENT:  CLINICAL  IMPRESSION: Patient demonstrated improved hip/knee stability during sit to stand after performing seated hip abd/add isometrics. Tactile cueing provided to improve proprioceptive awareness of L knee and ankle stability during single leg press.   OBJECTIVE IMPAIRMENTS: Abnormal gait, decreased activity tolerance, decreased balance, decreased endurance, decreased mobility, difficulty walking, decreased ROM, decreased strength, increased fascial restrictions, increased muscle spasms, impaired sensation, improper body mechanics, and pain.     GOALS: Goals reviewed with patient? Yes  SHORT TERM GOALS: Target date: 09/21/2022   Pt will be ind with initial HEP Baseline: Goal status: IN PROGRESS 1/3: MET    LONG TERM GOALS: Target date: 10/19/2022   Pt will be ind with advancement and progression of HEP Baseline:  Goal status: IN PROGRESS  2.  Pt will report improved pain by at least 50% with stair ascent/descent Baseline:  Goal status: IN PROGRESS  3.  Pt will be able to perform 5x STS without UE support to demo improved functional LE strength Baseline:  Goal status: IN PROGRESS  4.  Pt will demo improved body mechanics with less knee valgus during lifting Baseline:  Goal status: IN PROGRESS  5.  Pt will have improved FOTO score to >/= 67 Baseline:  Goal status: IN PROGRESS    PLAN:  PT FREQUENCY: 2x/week  PT DURATION: 8 weeks  PLANNED INTERVENTIONS: Therapeutic exercises, Therapeutic activity, Neuromuscular re-education, Balance training, Gait training, Patient/Family education, Self Care, Joint mobilization, Stair training, Orthotic/Fit training, Aquatic Therapy, Dry Needling, Electrical stimulation, Cryotherapy, Moist heat, Taping, Vasopneumatic device, Ultrasound, Ionotophoresis 72m/ml Dexamethasone, Manual therapy, and Re-evaluation  PLAN FOR NEXT SESSION: Progress HEP as tolerated. Continue glute strengthening. Work on bEconomistwith stairs and lifting. Manual work as indicated  [employer] PTA 09/21/2022

## 2022-09-23 ENCOUNTER — Encounter (HOSPITAL_BASED_OUTPATIENT_CLINIC_OR_DEPARTMENT_OTHER): Payer: Self-pay | Admitting: Physical Therapy

## 2022-09-23 ENCOUNTER — Ambulatory Visit (HOSPITAL_BASED_OUTPATIENT_CLINIC_OR_DEPARTMENT_OTHER): Payer: Medicare Other | Attending: Orthopedic Surgery | Admitting: Physical Therapy

## 2022-09-23 DIAGNOSIS — M6281 Muscle weakness (generalized): Secondary | ICD-10-CM | POA: Diagnosis not present

## 2022-09-23 DIAGNOSIS — M25562 Pain in left knee: Secondary | ICD-10-CM | POA: Diagnosis not present

## 2022-09-23 DIAGNOSIS — R262 Difficulty in walking, not elsewhere classified: Secondary | ICD-10-CM | POA: Insufficient documentation

## 2022-09-23 DIAGNOSIS — G8929 Other chronic pain: Secondary | ICD-10-CM | POA: Insufficient documentation

## 2022-09-23 NOTE — Therapy (Signed)
OUTPATIENT PHYSICAL THERAPY TREATMENT   Patient Name: Wanda Gonzales MRN: [id number] DOB:20-Jan-1956, 67 y.o., female Today's Date: 09/23/2022  END OF SESSION:  PT End of Session - 09/23/22 1504     Visit Number 9    Number of Visits 16    Date for PT Re-Evaluation 10/18/22    Authorization Type BCBS Medicare    Progress Note Due on Visit 10    PT Start Time 1503    PT Stop Time 1543    PT Time Calculation (min) 40 min    Activity Tolerance Patient tolerated treatment well    Behavior During Therapy WFL for tasks assessed/performed                Past Medical History:  Diagnosis Date   Abnormal Pap smear, atypical squamous cells of undetermined sign (ASC-US) 1998   treated with cryo, CIN I   ADD (attention deficit disorder) 02-23-12   easily distracted.   Arthritis 02-23-12   Rt. hip and lt. arm   Closed displaced fracture of fifth metatarsal bone of left foot 03/31/2017   Depression with anxiety    History of osteoporosis    Kidney calculi 07/2015   Kidney stone    PONV (postoperative nausea and vomiting) 02-23-12   always has PONV   Post-nasal drainage 02-23-12   frequent a problem,causes a cough and mucus buildup in throat   Urethral polyp 1998   Dr. Hartley Barefoot   Varicose veins    Venous insufficiency    chronic-denies any problems   Past Surgical History:  Procedure Laterality Date   blood clot removed Left 01/2012   blood clot removed from left hand    BREAST BIOPSY Right 02/14/2012   Fibroadenoma   COLONOSCOPY  08/2003   repeat 5 yrs.   COLONOSCOPY W/ BIOPSIES  12/2008   1 polyp recheck 5 yrs   EXCISION/RELEASE BURSA HIP  02/29/2012   HAND SURGERY  02/23/2012   "blood clot" excised palm left hand 01-18-12   KNEE ARTHROSCOPY W/ MENISCAL REPAIR  10/2012   KNEE ARTHROSCOPY WITH EXCISION BAKER'S CYST Left 10/2012   meniscal repair   ORIF METATARSAL FRACTURE Left 04/07/2017   Left 5th Metatarsal   SHOULDER SURGERY Right 02/2015   Dr. 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   Patient Active Problem List   Diagnosis Date Noted   IBS (irritable bowel syndrome with diarrhea) 08/17/2022   Hearing aid worn 02/10/2022   Frequent falls 11/26/2020   Depression with anxiety    History of osteoporosis    Age-related osteoporosis without current pathological fracture 07/29/2020   Osteoporosis 07/27/2020   Rhinitis 03/18/2020   Seborrheic keratosis 11/23/2017   S/P TKR (total knee replacement), left 08/25/2016   Benign paroxysmal positional vertigo 08/25/2016   Mid back pain, chronic 12/16/2015   Cervical strain 11/21/2014   DDD (degenerative disc disease), lumbar 02/17/2014   Vitamin D deficiency 03/18/2013   Trochanteric bursitis of right hip 02/29/2012   INSOMNIA 04/13/2010   Hyperlipidemia 09/17/2007   CERVICAL POLYP 09/17/2007   Attention deficit disorder 09/11/2007   UNSPECIFIED VENOUS INSUFFICIENCY 08/29/2007    PCP: Beatrice Lecher  REFERRING PROVIDER: Lemont Fillers DIAG: Z47.1: Aftercare following joint replacement surgery; [phone] presence of left artificial knee joint  THERAPY DIAG:  Chronic pain of left knee  Muscle weakness (generalized)  Difficulty in walking, not elsewhere classified  Rationale  for Evaluation and Treatment: Rehabilitation  ONSET DATE: ~4 years ago  SUBJECTIVE:   SUBJECTIVE STATEMENT: Patient states she continues to have difficulty with sit to stand and knee pain with prolonged sitting when Lt knee is flexed.  She voices frustration with continued pain in knee.   PERTINENT HISTORY: L TKA 4 years ago, L hip surgery, hx of L baker's cyst (removed) From eval: Pt reports L knee pain. Pt had L TKA 4 years ago. Pt had scar tissue removed May 24, 2022 but still getting pain. Pt has been going to pool and doing exercises. Pt has been walking. Has been using voltaren on her knee.   PAIN:  Are you having pain?  Yes: NPRS scale: 6/10 Pain location: Lt knee ( lateral/medial joint)  Pain description: Can be sharp, achy and dull Aggravating factors: Sit to stand, stairs, and getting out of bed; fatigue Relieving factors: Water therapy  PRECAUTIONS: None  WEIGHT BEARING RESTRICTIONS: No  FALLS:  Has patient fallen in last 6 months? Yes. Number of falls 5 or 6 times (normally to the left), last fall in October  OCCUPATION: Teaches art 2 days/wk  PATIENT GOALS: Improve knee pain with mobility  NEXT MD VISIT: n/a  OBJECTIVE:  From eval unless otherwise noted  PALPATION: TTP L anterior gastroc, patellar tendon, medial and lateral knee joint line  LOWER EXTREMITY ROM:  Active ROM Right eval Left eval  Knee flexion 120 115  Knee extension 0 0   (Blank rows = not tested)  LOWER EXTREMITY MMT:  MMT Right eval Left eval  Hip flexion 5 3+  Hip extension 3 3  Hip abduction 4- 3  Hip adduction 5 4-  Hip internal rotation    Hip external rotation    Knee flexion 5 5  Knee extension 5 5  Ankle dorsiflexion 5 5  Ankle plantarflexion 5 5  Ankle inversion 5 4  Ankle eversion 5 5   (Blank rows = not tested)  LOWER EXTREMITY SPECIAL TESTS:  Knee special tests: Patellafemoral apprehension test: positive   FUNCTIONAL TESTS:  SLS on R 4 sec, on L attempts but unable to hold R LE back tandem 30 sec, L LE back tandem 30 sec Painful sit to stand and squatting with increased L knee valgus noted Unable to perform stairs with reciprocal pattern -- L knee valgus noted with ascent and descent  GAIT: Distance walked: 150 Assistive device utilized: None Level of assistance: Complete Independence Comments: antalgic, decreased stance on L, trendelenburg pattern   [facility] Adult PT Treatment:                                                DATE: 09/23/22 Pt seen for aquatic therapy today.  Treatment took place in water 3.25-4.5 ft in depth at the [facility]. Temp of water was 91.  Pt  entered/exited the pool via stairs with step-to with bilat rail.  * warm up forward/ backward walking, side stepping, high knee marching  * adductor stretch x 30s x 2 each LE * holding yellow hand floats:  hip abdct/ addct x 10 each; long leg swings in sagittal plane x 10 each (mild increase in Lt joint line pain) * walking backward/forward with single yellow hand float at side for core engagement  * single leg press with thin square noodle x 20 LLE,  x 10 RLE (single UE on wall) * single yellow hand float under water at side, walking forward/ backward with core engaged * side stepping L/R with light hydro band x 2.5 laps  * UE support with yellow hand floats:  L unilateral heel raises x 10 x 2;  * L forward step ups x 12, eccentric lowering * fig4 stretch holding rails x 20x x 2 LLE, x 1 RLE * bilat gastroc stretch with heels off of step x 20s   Pt requires the buoyancy and hydrostatic pressure of water for support, and to offload joints by unweighting joint load by at least 50 % in navel deep water and by at least 75-80% in chest to neck deep water.  Viscosity of the water is needed for resistance of strengthening. Water current perturbations provides challenge to standing balance requiring increased core activation [facility] Adult PT Treatment:                                                DATE: 09/21/2022 Therapeutic Exercise: Recumbent bike warm up (L3) x 34mn S/L clamshells (discontinued d/t anterior knee pain) S/L L straight leg glute med leg raises  Passive piriformis stretch L SLR x10 Bent knee fall outs GTB x15 Prone hip ext 2x10 B Prone L quad stretch w/strap 2x30" Seated isometric hip abd/add 10x5" each STS x5 no HHA Leg Press: DL 90# x5, 65# x10; L SL 40# x5   [facility] Adult PT Treatment:                                                DATE: 09/16/22 Pt seen for aquatic therapy today.  Treatment took place in water 3.25-4.5 ft in depth at the [facility] Temp of water  was 91.  Pt entered/exited the pool via stairs with step-to with bilat rail.  * warm up forward/ backward walking and side stepping * single yellow hand float under water at side, walking forward/ backward with core engaged * UE support with yellow hand floats:  bilat heel raises x 15, unilateral heel raises x 10 each; SLS with leg swings front/back x 10, hip abdct/ addct x 10 x 2 * back against wall with single leg press with thin square noodle x 20 LLE, x 10 RLE (progressed from back on wall to single UE on wall) * L forward step ups x 10, eccentric lowering * fig4 stretch holding rails x 20x x 2 LLE, x 1 RLE * foot on 2nd step - forward lunge for L knee ROM/ back for hamstring stretch x 10  * Lt step ups on 2nd step x 4 reps with heavy UE assist, cues for knee alignment * straddling yellow noodle:  cycling, cc ski, jumping jack LE with LE suspended * after dried off: applied reg Rock tape in horseshoe pattern to Lt ant knee for decompression of tissue and increased proprioception  Pt requires the buoyancy and hydrostatic pressure of water for support, and to offload joints by unweighting joint load by at least 50 % in navel deep water and by at least 75-80% in chest to neck deep water.  Viscosity of the water is needed for resistance of strengthening. Water current perturbations provides  challenge to standing balance requiring increased core activation.   [facility] Adult PT Treatment:                                                DATE: 09/14/22 Therapeutic Exercise: Nustep L5-6 x 5 min LEs only Standing Quad stretch with strap 2x30 sec R&L Hamstring stretch 2x30 sec R&L Gastroc stretch 2x30 sec bilat 4" step side step x10 4" step forward step down 2x10 R&L Sidelying Hip abd 2x10 L Hip add 2x10 L Supine Piriformis stretch x30 sec Prone Hip ext 2x10 R&L Leg press 65# 2x10 DL,  Manual Therapy: STM & TPR L quad   PATIENT EDUCATION:  Education details: Discussed pad placement with  TENS unit Person educated: Patient Education method: Explanation, Demonstration, and Handouts Education comprehension: verbalized understanding, returned demonstration, and needs further education  HOME EXERCISE PROGRAM: Access Code: [email or web address] URL: [email or web address] Date: 09/14/2022 Prepared by: Estill Bamberg April Thurnell Garbe  Exercises - Hip Abduction with Resistance Loop  - 1 x daily - 7 x weekly - 2 sets - 10 reps - Hip Extension with Resistance Loop  - 1 x daily - 7 x weekly - 2 sets - 10 reps - Seated Piriformis Stretch  - 1 x daily - 7 x weekly - 3 sets - 3 reps - 30 sec hold - Sit to Stand with Armchair  - 1 x daily - 7 x weekly - 3 sets - 10 reps - Tandem Stance with Chair Support  - 1 x daily - 7 x weekly - 1 sets - 3 reps - 30 sec hold - Forward Step Down Touch with Heel  - 1 x daily - 7 x weekly - 2 sets - 10 reps   ASSESSMENT:  CLINICAL IMPRESSION: Pt reported increased Lt knee pain at beginning of session with leg swings into abdct and flexion. Progressed balance and strengthening exercises for Lt hip / knee without difficulty. Encouraged pt to continue compliance with stretching daily and completing her HEP.  Progressing towards goals.  10th visit progress note to be completed at next visit.   OBJECTIVE IMPAIRMENTS: Abnormal gait, decreased activity tolerance, decreased balance, decreased endurance, decreased mobility, difficulty walking, decreased ROM, decreased strength, increased fascial restrictions, increased muscle spasms, impaired sensation, improper body mechanics, and pain.    GOALS: Goals reviewed with patient? Yes  SHORT TERM GOALS: Target date: 09/21/2022   Pt will be ind with initial HEP Baseline: Goal status: IN PROGRESS 1/3: MET    LONG TERM GOALS: Target date: 10/19/2022   Pt will be ind with advancement and progression of HEP Baseline:  Goal status: IN PROGRESS  2.  Pt will report improved pain by at least 50% with stair  ascent/descent Baseline:  Goal status: IN PROGRESS  3.  Pt will be able to perform 5x STS without UE support to demo improved functional LE strength Baseline:  Goal status: IN PROGRESS  4.  Pt will demo improved body mechanics with less knee valgus during lifting Baseline:  Goal status: IN PROGRESS  5.  Pt will have improved FOTO score to >/= 67 Baseline:  Goal status: IN PROGRESS    PLAN:  PT FREQUENCY: 2x/week  PT DURATION: 8 weeks  PLANNED INTERVENTIONS: Therapeutic exercises, Therapeutic activity, Neuromuscular re-education, Balance training, Gait training, Patient/Family education, Self Care, Joint mobilization, Stair training, Orthotic/Fit training, Aquatic Therapy, Dry  Needling, Electrical stimulation, Cryotherapy, Moist heat, Taping, Vasopneumatic device, Ultrasound, Ionotophoresis '4mg'$ /ml Dexamethasone, Manual therapy, and Re-evaluation  PLAN FOR NEXT SESSION: Progress HEP as tolerated. Continue glute strengthening. Work on Economist with stairs and lifting. Manual work as indicated  [employer], PTA 09/23/22 5:15 PM [facility] [address] [place], [place], [address] Phone: [phone]   Fax:  [phone]

## 2022-09-28 ENCOUNTER — Ambulatory Visit: Payer: Medicare Other

## 2022-09-28 DIAGNOSIS — G8929 Other chronic pain: Secondary | ICD-10-CM | POA: Diagnosis not present

## 2022-09-28 DIAGNOSIS — M25562 Pain in left knee: Secondary | ICD-10-CM | POA: Diagnosis not present

## 2022-09-28 DIAGNOSIS — M6281 Muscle weakness (generalized): Secondary | ICD-10-CM | POA: Diagnosis not present

## 2022-09-28 DIAGNOSIS — R296 Repeated falls: Secondary | ICD-10-CM

## 2022-09-28 DIAGNOSIS — R262 Difficulty in walking, not elsewhere classified: Secondary | ICD-10-CM | POA: Diagnosis not present

## 2022-09-28 NOTE — Progress Notes (Signed)
67 y.o. G0P0 Married White or Caucasian Not Hispanic or Latino female here for annual exam.  No vaginal bleeding.  She has been having issues with diarrhea, has seen GI and has been started on medication.    She is having issues with her left knee. Had a knee replacement 4 years ago, just had a arthroscopy with removal of scar tissue in the fall. Going to PT.   Patient's last menstrual period was 12/18/2013.          Sexually active: No.  The current method of family planning is post menopausal status.    Exercising:   walking Smoker:  no  Health Maintenance: Pap:  08-29-18 negative, HR HPV negative            05-14-15 negative, HR HPV negative              H/O CIN I in 1998 History of abnormal Pap:  yes MMG:  10/06/21 density C Bi-rads 1 neg, appt next week.  BMD:   H/O osteoporosis, started on Fosamax with Endocrinology in 11/21. Last DEXA on 08/31/22 with a T score of -2.3  Colonoscopy:  12-26-19 polyp, f/u in 5 years TDaP:  09/16/19  Gardasil: n/a   reports that she has never smoked. She has never used smokeless tobacco. She reports current alcohol use of about 1.0 standard drink of alcohol per week. She reports that she does not use drugs. 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 Husband is retired.   Past Medical History:  Diagnosis Date   Abnormal Pap smear, atypical squamous cells of undetermined sign (ASC-US) 1998   treated with cryo, CIN I   ADD (attention deficit disorder) 02-23-12   easily distracted.   Arthritis 02-23-12   Rt. hip and lt. arm   Closed displaced fracture of fifth metatarsal bone of left foot 03/31/2017   Depression with anxiety    History of osteoporosis    Kidney calculi 07/2015   Kidney stone    PONV (postoperative nausea and vomiting) 02-23-12   always has PONV   Post-nasal drainage 02-23-12   frequent a problem,causes a cough and mucus buildup in throat   Urethral polyp 1998   Dr. Hartley Barefoot   Varicose  veins    Venous insufficiency    chronic-denies any problems    Past Surgical History:  Procedure Laterality Date   blood clot removed Left 01/2012   blood clot removed from left hand    BREAST BIOPSY Right 02/14/2012   Fibroadenoma   COLONOSCOPY  08/2003   repeat 5 yrs.   COLONOSCOPY W/ BIOPSIES  12/2008   1 polyp recheck 5 yrs   EXCISION/RELEASE BURSA HIP  02/29/2012   HAND SURGERY  02/23/2012   "blood clot" excised palm left hand 01-18-12   KNEE ARTHROSCOPY W/ MENISCAL REPAIR  10/2012   KNEE ARTHROSCOPY WITH EXCISION BAKER'S CYST Left 10/2012   meniscal repair   ORIF METATARSAL FRACTURE Left 04/07/2017   Left 5th Metatarsal   SHOULDER SURGERY Right 02/2015   Dr. 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     alendronate (FOSAMAX) 70 MG tablet Take 70 mg by mouth once a week.     escitalopram (LEXAPRO)  10 MG tablet Take 1 tablet (10 mg total) by mouth daily. 90 tablet 1   hyoscyamine (LEVBID) 0.375 MG 12 hr tablet Take 0.375 mg by mouth daily.     meloxicam (MOBIC) 15 MG tablet ONE TAB IN THE MORNING WITH A MEAL FOR 2 WEEKS, THEN DAILY AS NEEDED PAIN. 30 tablet 3   Multiple Vitamin (MULTIVITAMIN) tablet Take 1 tablet by mouth daily.     VITAMIN D PO Take by mouth daily.     No current facility-administered medications for this visit.    Family History  Problem Relation Age of Onset   Breast cancer Mother 72       breast   Hyperlipidemia Mother    Brain cancer Father 62       brain   Osteoporosis Maternal Grandmother    Drug abuse Brother     Review of Systems  All other systems reviewed and are negative.   Exam:   BP 122/80 (BP Location: Right Arm, Patient Position: Sitting, Cuff Size: Normal)   Pulse 72   Resp 16   Ht 5' (1.524 m)   LMP  12/18/2013   BMI 28.12 kg/m   Weight change: '@WEIGHTCHANGE'$ @ Height:   Height: 5' (152.4 cm)  Ht Readings from Last 3 Encounters:  10/05/22 5' (1.524 m)  08/17/22 '5\' 1"'$  (1.549 m)  07/05/22 '5\' 1"'$  (1.549 m)    General appearance: alert, cooperative and appears stated age Head: Normocephalic, without obvious abnormality, atraumatic Neck: no adenopathy, supple, symmetrical, trachea midline and thyroid normal to inspection and palpation Breasts: normal appearance, no masses or tenderness Abdomen: soft, non-tender; non distended,  no masses,  no organomegaly Extremities: extremities normal, atraumatic, no cyanosis or edema Skin: Skin color, texture, turgor normal. No rashes or lesions Lymph nodes: Cervical, supraclavicular, and axillary nodes normal. No abnormal inguinal nodes palpated Neurologic: Grossly normal   Pelvic: External genitalia:  no lesions              Urethra:  normal appearing urethra with no masses, tenderness or lesions              Bartholins and Skenes: normal                 Vagina: normal appearing vagina with normal color and discharge, no lesions              Cervix: no lesions               Bimanual Exam:  Uterus:   no masses or tenderness              Adnexa: no mass, fullness, tenderness               Rectovaginal: Confirms               Anus:  normal sphincter tone, no lesions  Kimalexis, RMA chaperoned for the exam.  1. Encounter for breast and pelvic examination Mammogram due Colonoscopy and DEXA are UTD Labs with primary Discussed breast self exam Discussed calcium and vit D intake  2. Screening for cervical cancer - Cytology - PAP

## 2022-09-28 NOTE — Therapy (Addendum)
OUTPATIENT PHYSICAL THERAPY PROGRESS NOTE   Patient Name: Wanda Gonzales MRN: [id number] DOB:June 22, 1956, 67 y.o., female Today's Date: 09/28/2022  Progress Note Reporting Period 08/23/22 to 09/28/2022  See note below for Objective Data and Assessment of Progress/Goals.      END OF SESSION:  PT End of Session - 09/28/22 1405     Visit Number 10    Number of Visits 16    Date for PT Re-Evaluation 10/18/22    Progress Note Due on Visit --    PT Start Time 1405    PT Stop Time 1445    PT Time Calculation (min) 40 min    Activity Tolerance Patient tolerated treatment well    Behavior During Therapy WFL for tasks assessed/performed                Past Medical History:  Diagnosis Date   Abnormal Pap smear, atypical squamous cells of undetermined sign (ASC-US) 1998   treated with cryo, CIN I   ADD (attention deficit disorder) 02-23-12   easily distracted.   Arthritis 02-23-12   Rt. hip and lt. arm   Closed displaced fracture of fifth metatarsal bone of left foot 03/31/2017   Depression with anxiety    History of osteoporosis    Kidney calculi 07/2015   Kidney stone    PONV (postoperative nausea and vomiting) 02-23-12   always has PONV   Post-nasal drainage 02-23-12   frequent a problem,causes a cough and mucus buildup in throat   Urethral polyp 1998   Dr. Hartley Barefoot   Varicose veins    Venous insufficiency    chronic-denies any problems   Past Surgical History:  Procedure Laterality Date   blood clot removed Left 01/2012   blood clot removed from left hand    BREAST BIOPSY Right 02/14/2012   Fibroadenoma   COLONOSCOPY  08/2003   repeat 5 yrs.   COLONOSCOPY W/ BIOPSIES  12/2008   1 polyp recheck 5 yrs   EXCISION/RELEASE BURSA HIP  02/29/2012   HAND SURGERY  02/23/2012   "blood clot" excised palm left hand 01-18-12   KNEE ARTHROSCOPY W/ MENISCAL REPAIR  10/2012   KNEE ARTHROSCOPY WITH EXCISION BAKER'S CYST Left 10/2012   meniscal repair   ORIF METATARSAL FRACTURE Left  04/07/2017   Left 5th Metatarsal   SHOULDER SURGERY Right 02/2015   Dr. 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   Patient Active Problem List   Diagnosis Date Noted   IBS (irritable bowel syndrome with diarrhea) 08/17/2022   Hearing aid worn 02/10/2022   Frequent falls 11/26/2020   Depression with anxiety    History of osteoporosis    Age-related osteoporosis without current pathological fracture 07/29/2020   Osteoporosis 07/27/2020   Rhinitis 03/18/2020   Seborrheic keratosis 11/23/2017   S/P TKR (total knee replacement), left 08/25/2016   Benign paroxysmal positional vertigo 08/25/2016   Mid back pain, chronic 12/16/2015   Cervical strain 11/21/2014   DDD (degenerative disc disease), lumbar 02/17/2014   Vitamin D deficiency 03/18/2013   Trochanteric bursitis of right hip 02/29/2012   INSOMNIA 04/13/2010   Hyperlipidemia 09/17/2007   CERVICAL POLYP 09/17/2007   Attention deficit disorder 09/11/2007   UNSPECIFIED VENOUS INSUFFICIENCY 08/29/2007    PCP: Beatrice Lecher  REFERRING PROVIDER: Gaynelle Arabian  REFERRING DIAG: Z47.1: Aftercare following joint replacement surgery; [phone] presence of left artificial knee joint  THERAPY DIAG:  Chronic  pain of left knee  Muscle weakness (generalized)  Difficulty in walking, not elsewhere classified  Repeated falls  Rationale for Evaluation and Treatment: Rehabilitation  ONSET DATE: ~4 years ago  SUBJECTIVE:   SUBJECTIVE STATEMENT: Patient states she continues to occasional have "popping" in knee with mild pain. Patient states she continues to have pain around L knee that fluctuates throughout the day. Patient states her pain and fatigue levels depend on the time of day (worse at end of work day). Patient states she continues to have more pain walking up than down stairs. Patient reports she has not had any falls since starting therapy.    PERTINENT HISTORY: L TKA 4 years ago, L hip surgery, hx of L baker's cyst (removed) From eval: Pt reports L knee pain. Pt had L TKA 4 years ago. Pt had scar tissue removed May 24, 2022 but still getting pain. Pt has been going to pool and doing exercises. Pt has been walking. Has been using voltaren on her knee.   PAIN:  Are you having pain? Yes: NPRS scale: 1/10 Pain location: anterior knee Pain description: Can be sharp, achy and dull Aggravating factors: Sit to stand, stairs, and getting out of bed; fatigue Relieving factors: Water therapy  PRECAUTIONS: None  WEIGHT BEARING RESTRICTIONS: No  FALLS:  Has patient fallen in last 6 months? Yes. Number of falls 5 or 6 times (normally to the left), last fall in October  OCCUPATION: Teaches art 2 days/wk  PATIENT GOALS: Improve knee pain with mobility  NEXT MD VISIT: n/a  OBJECTIVE:  From eval unless otherwise noted  PALPATION: TTP L anterior gastroc, patellar tendon, medial and lateral knee joint line  LOWER EXTREMITY ROM:  Active ROM Right eval Left eval Right 1/10 Left  1/10  Knee flexion 120 115 130 122  Knee extension 0 0     (Blank rows = not tested)  LOWER EXTREMITY MMT:  MMT Right eval Left eval Right 1/10 Left 1/10  Hip flexion 5 3+ 4 4-  Hip extension 3 3 3+ 4-  Hip abduction 4- 3 4+ 4-  Hip adduction 5 4-  4+  Hip internal rotation      Hip external rotation      Knee flexion 5 5    Knee extension 5 5    Ankle dorsiflexion 5 5    Ankle plantarflexion 5 5    Ankle inversion 5 4    Ankle eversion 5 5     (Blank rows = not tested)  LOWER EXTREMITY SPECIAL TESTS:  Knee special tests: Patellafemoral apprehension test: positive   FUNCTIONAL TESTS:  SLS on R 4 sec, on L attempts but unable to hold R LE back tandem 30 sec, L LE back tandem 30 sec Painful sit to stand and squatting with increased L knee valgus noted Unable to perform stairs with reciprocal pattern -- L knee valgus noted with  ascent and descent  GAIT: Distance walked: 150 Assistive device utilized: None Level of assistance: Complete Independence Comments: antalgic, decreased stance on L, trendelenburg pattern   [facility] Adult PT Treatment:                                                DATE: 09/28/2022 Therapeutic Exercise: Knee flexion AROM measurements  Hip MMT Leg Press (cueing abdominal bracing): DL 90# 2x10, SL 40# x8  B Dynamic resisted hip flexion (unilateral march) YTB R x10, L 2x10 Therapeutic Activity: Subjective intake on progress Assessment of lifting body mechancis/knee alignment Ascend/descend stairs FOTO   [facility] Adult PT Treatment:                                                DATE: 09/21/2022 Therapeutic Exercise: Recumbent bike warm up (L3) x 39mn S/L clamshells (discontinued d/t anterior knee pain) S/L L straight leg glute med leg raises  Passive piriformis stretch L SLR x10 Bent knee fall outs GTB x15 Prone hip ext 2x10 B Prone L quad stretch w/strap 2x30" Seated isometric hip abd/add 10x5" each STS x5 no HHA Leg Press: DL 90# x5, 65# x10; L SL 40# x5   [facility] Adult PT Treatment:                                                DATE: 09/16/22 Pt seen for aquatic therapy today.  Treatment took place in water 3.25-4.5 ft in depth at the [facility] Temp of water was 91.  Pt entered/exited the pool via stairs with step-to with bilat rail.  * warm up forward/ backward walking and side stepping * single yellow hand float under water at side, walking forward/ backward with core engaged * UE support with yellow hand floats:  bilat heel raises x 15, unilateral heel raises x 10 each; SLS with leg swings front/back x 10, hip abdct/ addct x 10 x 2 * back against wall with single leg press with thin square noodle x 20 LLE, x 10 RLE (progressed from back on wall to single UE on wall) * L forward step ups x 10, eccentric lowering * fig4 stretch holding rails x 20x x 2 LLE, x 1 RLE *  foot on 2nd step - forward lunge for L knee ROM/ back for hamstring stretch x 10  * Lt step ups on 2nd step x 4 reps with heavy UE assist, cues for knee alignment * straddling yellow noodle:  cycling, cc ski, jumping jack LE with LE suspended * after dried off: applied reg Rock tape in horseshoe pattern to Lt ant knee for decompression of tissue and increased proprioception  Pt requires the buoyancy and hydrostatic pressure of water for support, and to offload joints by unweighting joint load by at least 50 % in navel deep water and by at least 75-80% in chest to neck deep water.  Viscosity of the water is needed for resistance of strengthening. Water current perturbations provides challenge to standing balance requiring increased core activation.    PATIENT EDUCATION:  Education details: Abdominal bracing for core stability on leg press Person educated: Patient Education method: Explanation, Demonstration, and Handouts Education comprehension: verbalized understanding, returned demonstration, and needs further education  HOME EXERCISE PROGRAM: Access Code: [email or web address] URL: [email or web address] Date: 09/14/2022 Prepared by: GEstill BambergApril MThurnell Garbe Exercises - Hip Abduction with Resistance Loop  - 1 x daily - 7 x weekly - 2 sets - 10 reps - Hip Extension with Resistance Loop  - 1 x daily - 7 x weekly - 2 sets - 10 reps - Seated Piriformis Stretch  - 1 x daily - 7 x weekly - 3  sets - 3 reps - 30 sec hold - Sit to Stand with Armchair  - 1 x daily - 7 x weekly - 3 sets - 10 reps - Tandem Stance with Chair Support  - 1 x daily - 7 x weekly - 1 sets - 3 reps - 30 sec hold - Forward Step Down Touch with Heel  - 1 x daily - 7 x weekly - 2 sets - 10 reps   ASSESSMENT:  CLINICAL IMPRESSION: Patient demonstrated improved knee stability and postural alignment during lifting activity, most notably with decreased knee valgus. Good improvement demonstrated with L hip strength as  measured by MMT, most significantly with L hip extension and abduction. Continued moderate pain in L knee when descending stairs; step through pattern with no pain demonstrated while ascending stairs. FOTO intake shows indicates progression towards improved mobility with participation in ADLs. Patient will continue to benefit from therapy to decrease knee pain, improve overall stability and strength and perform ADLs with improved ease and mobility.  OBJECTIVE IMPAIRMENTS: Abnormal gait, decreased activity tolerance, decreased balance, decreased endurance, decreased mobility, difficulty walking, decreased ROM, decreased strength, increased fascial restrictions, increased muscle spasms, impaired sensation, improper body mechanics, and pain.    GOALS: Goals reviewed with patient? Yes  SHORT TERM GOALS: Target date: 09/21/2022   Pt will be ind with initial HEP Baseline: Goal status: IN PROGRESS 1/3: MET    LONG TERM GOALS: Target date: 10/19/2022   Pt will be ind with advancement and progression of HEP Baseline:  Goal status: MET 1/10   2.  Pt will report improved pain by at least 50% with stair ascent/descent Baseline:  Goal status: 1/10: PROGRESSING (L>R descend)  3.  Pt will be able to perform 5x STS without UE support to demo improved functional LE strength Baseline:  Goal status: MET 1/10  4.  Pt will demo improved body mechanics with less knee valgus during lifting Baseline:  Goal status: MET 1/10  5.  Pt will have improved FOTO score to >/= 67 Baseline:  Goal status: IN PROGRESS 1/10 -- 52 1/10: PROGRESSING (52)    PLAN:  PT FREQUENCY: 2x/week  PT DURATION: 8 weeks  PLANNED INTERVENTIONS: Therapeutic exercises, Therapeutic activity, Neuromuscular re-education, Balance training, Gait training, Patient/Family education, Self Care, Joint mobilization, Stair training, Orthotic/Fit training, Aquatic Therapy, Dry Needling, Electrical stimulation, Cryotherapy, Moist heat,  Taping, Vasopneumatic device, Ultrasound, Ionotophoresis '4mg'$ /ml Dexamethasone, Manual therapy, and Re-evaluation  PLAN FOR NEXT SESSION: Progress HEP as tolerated. Continue glute/hip strengthening. Work on Economist with stairs and lifting. Manual work as indicated   Gellen April Ma L [place], PT 09/28/2022, 3:12 PM  Helane Gunther, PTA 09/28/2022

## 2022-09-29 ENCOUNTER — Ambulatory Visit (HOSPITAL_BASED_OUTPATIENT_CLINIC_OR_DEPARTMENT_OTHER): Payer: Medicare Other | Admitting: Physical Therapy

## 2022-09-29 DIAGNOSIS — M6281 Muscle weakness (generalized): Secondary | ICD-10-CM | POA: Diagnosis not present

## 2022-09-29 DIAGNOSIS — G8929 Other chronic pain: Secondary | ICD-10-CM | POA: Diagnosis not present

## 2022-09-29 DIAGNOSIS — R262 Difficulty in walking, not elsewhere classified: Secondary | ICD-10-CM

## 2022-09-29 DIAGNOSIS — M25562 Pain in left knee: Secondary | ICD-10-CM | POA: Diagnosis not present

## 2022-09-29 NOTE — Therapy (Signed)
OUTPATIENT PHYSICAL THERAPY PROGRESS NOTE   Patient Name: Wanda Gonzales MRN: [id number] DOB:15-Jun-1956, 67 y.o., female Today's Date: 09/29/2022     END OF SESSION:  PT End of Session - 09/29/22 0818     Visit Number 11    Number of Visits 16    Date for PT Re-Evaluation 10/18/22    Authorization Type BCBS Medicare    Progress Note Due on Visit 20    PT Start Time 0818    PT Stop Time 0857    PT Time Calculation (min) 39 min    Activity Tolerance Patient tolerated treatment well    Behavior During Therapy WFL for tasks assessed/performed                Past Medical History:  Diagnosis Date   Abnormal Pap smear, atypical squamous cells of undetermined sign (ASC-US) 1998   treated with cryo, CIN I   ADD (attention deficit disorder) 02-23-12   easily distracted.   Arthritis 02-23-12   Rt. hip and lt. arm   Closed displaced fracture of fifth metatarsal bone of left foot 03/31/2017   Depression with anxiety    History of osteoporosis    Kidney calculi 07/2015   Kidney stone    PONV (postoperative nausea and vomiting) 02-23-12   always has PONV   Post-nasal drainage 02-23-12   frequent a problem,causes a cough and mucus buildup in throat   Urethral polyp 1998   Dr. Hartley Barefoot   Varicose veins    Venous insufficiency    chronic-denies any problems   Past Surgical History:  Procedure Laterality Date   blood clot removed Left 01/2012   blood clot removed from left hand    BREAST BIOPSY Right 02/14/2012   Fibroadenoma   COLONOSCOPY  08/2003   repeat 5 yrs.   COLONOSCOPY W/ BIOPSIES  12/2008   1 polyp recheck 5 yrs   EXCISION/RELEASE BURSA HIP  02/29/2012   HAND SURGERY  02/23/2012   "blood clot" excised palm left hand 01-18-12   KNEE ARTHROSCOPY W/ MENISCAL REPAIR  10/2012   KNEE ARTHROSCOPY WITH EXCISION BAKER'S CYST Left 10/2012   meniscal repair   ORIF METATARSAL FRACTURE Left 04/07/2017   Left 5th Metatarsal   SHOULDER SURGERY Right 02/2015   Dr. 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   Patient Active Problem List   Diagnosis Date Noted   IBS (irritable bowel syndrome with diarrhea) 08/17/2022   Hearing aid worn 02/10/2022   Frequent falls 11/26/2020   Depression with anxiety    History of osteoporosis    Age-related osteoporosis without current pathological fracture 07/29/2020   Osteoporosis 07/27/2020   Rhinitis 03/18/2020   Seborrheic keratosis 11/23/2017   S/P TKR (total knee replacement), left 08/25/2016   Benign paroxysmal positional vertigo 08/25/2016   Mid back pain, chronic 12/16/2015   Cervical strain 11/21/2014   DDD (degenerative disc disease), lumbar 02/17/2014   Vitamin D deficiency 03/18/2013   Trochanteric bursitis of right hip 02/29/2012   INSOMNIA 04/13/2010   Hyperlipidemia 09/17/2007   CERVICAL POLYP 09/17/2007   Attention deficit disorder 09/11/2007   UNSPECIFIED VENOUS INSUFFICIENCY 08/29/2007    PCP: Beatrice Lecher  REFERRING PROVIDER: Lemont Fillers DIAG: Z47.1: Aftercare following joint replacement surgery; [phone] presence of left artificial knee joint  THERAPY DIAG:  Chronic pain of left knee  Muscle weakness (generalized)  Difficulty in walking, not  elsewhere classified  Rationale for Evaluation and Treatment: Rehabilitation  ONSET DATE: ~4 years ago  SUBJECTIVE:   SUBJECTIVE STATEMENT: "My back is killing me today.  I slept with a heating pad last night".  Pt reports the leg press exercise irritated her back despite engaging her abdominals during exertion.  "My knee is ok, but I've only been up an hour. She reports that she has had some improvement since starting therapy.    PERTINENT HISTORY: L TKA 4 years ago, L hip surgery, hx of L baker's cyst (removed) From eval: Pt reports L knee pain. Pt had L TKA 4 years ago. Pt had scar tissue removed May 24, 2022 but still getting pain. Pt has been  going to pool and doing exercises. Pt has been walking. Has been using voltaren on her knee.   PAIN:  Are you having pain? Yes: NPRS scale: 6/10 Pain location:  bilat lower back  Pain description: achy and dull Aggravating factors: leg press Relieving factors: Water therapy  PRECAUTIONS: None  WEIGHT BEARING RESTRICTIONS: No  FALLS:  Has patient fallen in last 6 months? Yes. Number of falls 5 or 6 times (normally to the left), last fall in October  OCCUPATION: Teaches art 2 days/wk  PATIENT GOALS: Improve knee pain with mobility  NEXT MD VISIT: n/a  OBJECTIVE:  From eval unless otherwise noted  PALPATION: TTP L anterior gastroc, patellar tendon, medial and lateral knee joint line  LOWER EXTREMITY ROM:  Active ROM Right eval Left eval Right 1/10 Left  1/10  Knee flexion 120 115 130 122  Knee extension 0 0     (Blank rows = not tested)  LOWER EXTREMITY MMT:  MMT Right eval Left eval Right 1/10 Left 1/10  Hip flexion 5 3+ 4 4-  Hip extension 3 3 3+ 4-  Hip abduction 4- 3 4+ 4-  Hip adduction 5 4-  4+  Hip internal rotation      Hip external rotation      Knee flexion 5 5    Knee extension 5 5    Ankle dorsiflexion 5 5    Ankle plantarflexion 5 5    Ankle inversion 5 4    Ankle eversion 5 5     (Blank rows = not tested)  LOWER EXTREMITY SPECIAL TESTS:  Knee special tests: Patellafemoral apprehension test: positive   FUNCTIONAL TESTS:  SLS on R 4 sec, on L attempts but unable to hold R LE back tandem 30 sec, L LE back tandem 30 sec Painful sit to stand and squatting with increased L knee valgus noted Unable to perform stairs with reciprocal pattern -- L knee valgus noted with ascent and descent  GAIT: Distance walked: 150 Assistive device utilized: None Level of assistance: Complete Independence Comments: antalgic, decreased stance on L, trendelenburg pattern   [facility] Adult PT Treatment:                                                DATE:  09/29/21 Pt seen for aquatic therapy today.  Treatment took place in water 3.25-4.5 ft in depth at the [facility]. Temp of water was 85.  Pt entered/exited the pool via stairs with step-to with bilat rail.  * warm up backward walking, high knee marching forward/backward and side stepping with shoulder abdct/add with rainbow hand floats * SLS  with single leg press with thin square noodle x 20 LLE, x 10 RLE, intermittent UE to steady * standing on square noodle- balance in partial squat, then marching * Rt forward step downs, Lt retro step ups x 10, x 5 on Rt for comparison * Lt forward step ups on 2nd step, eccentric lowering down with RLE x 10, UE on rails * holding yellow hand floats: hip abdct/ addct x 10 each; long leg swings in sagittal plane x 10 RLE, 20 LL; bilat heel raises x 20  * side lunge with 15 sec stretch x 3 each direction  * single yellow hand float under water at side, walking forward/ backward with core engaged * fig4 stretch holding rails x 20x x 2 each;  bilat gastroc stretch with heels off of step x 45s  Pt requires the buoyancy and hydrostatic pressure of water for support, and to offload joints by unweighting joint load by at least 50 % in navel deep water and by at least 75-80% in chest to neck deep water.  Viscosity of the water is needed for resistance of strengthening. Water current perturbations provides challenge to standing balance requiring increased core activation.  [place] Adult PT Treatment:                                                DATE: 09/28/2022 Therapeutic Exercise: Knee flexion AROM measurements  Hip MMT Leg Press (cueing abdominal bracing): DL 90# 2x10, SL 40# x8 B Dynamic resisted hip flexion (unilateral march) YTB R x10, L 2x10 Therapeutic Activity: Subjective intake on progress Assessment of lifting body mechancis/knee alignment Ascend/descend stairs FOTO   [facility] Adult PT Treatment:                                                 DATE: 09/21/2022 Therapeutic Exercise: Recumbent bike warm up (L3) x 36mn S/L clamshells (discontinued d/t anterior knee pain) S/L L straight leg glute med leg raises  Passive piriformis stretch L SLR x10 Bent knee fall outs GTB x15 Prone hip ext 2x10 B Prone L quad stretch w/strap 2x30" Seated isometric hip abd/add 10x5" each STS x5 no HHA Leg Press: DL 90# x5, 65# x10; L SL 40# x5     PATIENT EDUCATION:  Education details: aquatic progressions / modifications Person educated: Patient Education method: EConsulting civil engineer Demonstration, and Handouts Education comprehension: verbalized understanding, returned demonstration, and needs further education  HOME EXERCISE PROGRAM: Access Code: [email or web address] URL: [email or web address] Date: 09/14/2022 Prepared by: GEstill BambergApril MThurnell Garbe Exercises - Hip Abduction with Resistance Loop  - 1 x daily - 7 x weekly - 2 sets - 10 reps - Hip Extension with Resistance Loop  - 1 x daily - 7 x weekly - 2 sets - 10 reps - Seated Piriformis Stretch  - 1 x daily - 7 x weekly - 3 sets - 3 reps - 30 sec hold - Sit to Stand with Armchair  - 1 x daily - 7 x weekly - 3 sets - 10 reps - Tandem Stance with Chair Support  - 1 x daily - 7 x weekly - 1 sets - 3 reps - 30 sec hold - Forward Step Down Touch with Heel  -  1 x daily - 7 x weekly - 2 sets - 10 reps   ASSESSMENT:  CLINICAL IMPRESSION: Pt reported mild increased in Lt knee pain to 1-2/10 with step ups with one episode of popping, but otherwise tolerated aquatic exercises with overall reduction of pain to 0/10 in back and Lt knee. Patient will continue to benefit from therapy to decrease knee pain, improve overall stability and strength and perform ADLs with improved ease and mobility.  OBJECTIVE IMPAIRMENTS: Abnormal gait, decreased activity tolerance, decreased balance, decreased endurance, decreased mobility, difficulty walking, decreased ROM, decreased strength, increased fascial  restrictions, increased muscle spasms, impaired sensation, improper body mechanics, and pain.    GOALS: Goals reviewed with patient? Yes  SHORT TERM GOALS: Target date: 09/21/2022   Pt will be ind with initial HEP Baseline: Goal status: IN PROGRESS 1/3: MET    LONG TERM GOALS: Target date: 10/19/2022   Pt will be ind with advancement and progression of HEP Baseline:  Goal status: MET 1/10   2.  Pt will report improved pain by at least 50% with stair ascent/descent Baseline:  Goal status: 1/10: PROGRESSING (L>R descend)  3.  Pt will be able to perform 5x STS without UE support to demo improved functional LE strength Baseline:  Goal status: MET 1/10  4.  Pt will demo improved body mechanics with less knee valgus during lifting Baseline:  Goal status: MET 1/10  5.  Pt will have improved FOTO score to >/= 67 Baseline:  Goal status: IN PROGRESS 1/10 -- 52 1/10: PROGRESSING (52)    PLAN:  PT FREQUENCY: 2x/week  PT DURATION: 8 weeks  PLANNED INTERVENTIONS: Therapeutic exercises, Therapeutic activity, Neuromuscular re-education, Balance training, Gait training, Patient/Family education, Self Care, Joint mobilization, Stair training, Orthotic/Fit training, Aquatic Therapy, Dry Needling, Electrical stimulation, Cryotherapy, Moist heat, Taping, Vasopneumatic device, Ultrasound, Ionotophoresis '4mg'$ /ml Dexamethasone, Manual therapy, and Re-evaluation  PLAN FOR NEXT SESSION: Progress HEP as tolerated. Continue glute/hip strengthening. Work on Economist with stairs and lifting. Manual work as indicated  [employer], PTA 09/29/22 9:26 AM [facility] [place] [address] [place], [place], [address] Phone: [phone]   Fax:  [phone]

## 2022-10-05 ENCOUNTER — Ambulatory Visit (INDEPENDENT_AMBULATORY_CARE_PROVIDER_SITE_OTHER): Payer: Medicare Other | Admitting: Obstetrics and Gynecology

## 2022-10-05 ENCOUNTER — Encounter: Payer: Self-pay | Admitting: Obstetrics and Gynecology

## 2022-10-05 ENCOUNTER — Other Ambulatory Visit (HOSPITAL_COMMUNITY)
Admission: RE | Admit: 2022-10-05 | Discharge: 2022-10-05 | Disposition: A | Discharge status: Home / Self Care | Payer: Medicare Other | Source: Ambulatory Visit | Attending: Obstetrics and Gynecology | Admitting: Obstetrics and Gynecology

## 2022-10-05 ENCOUNTER — Ambulatory Visit: Payer: Medicare Other

## 2022-10-05 VITALS — BP 122/80 | HR 72 | Resp 16 | Ht 60.0 in

## 2022-10-05 DIAGNOSIS — R262 Difficulty in walking, not elsewhere classified: Secondary | ICD-10-CM

## 2022-10-05 DIAGNOSIS — M25562 Pain in left knee: Secondary | ICD-10-CM | POA: Diagnosis not present

## 2022-10-05 DIAGNOSIS — G8929 Other chronic pain: Secondary | ICD-10-CM | POA: Diagnosis not present

## 2022-10-05 DIAGNOSIS — Z01419 Encounter for gynecological examination (general) (routine) without abnormal findings: Secondary | ICD-10-CM | POA: Diagnosis not present

## 2022-10-05 DIAGNOSIS — Z124 Encounter for screening for malignant neoplasm of cervix: Secondary | ICD-10-CM | POA: Insufficient documentation

## 2022-10-05 DIAGNOSIS — M6281 Muscle weakness (generalized): Secondary | ICD-10-CM | POA: Diagnosis not present

## 2022-10-05 DIAGNOSIS — R296 Repeated falls: Secondary | ICD-10-CM

## 2022-10-05 NOTE — Therapy (Signed)
OUTPATIENT PHYSICAL THERAPY TREATMENT   Patient Name: Wanda Gonzales MRN: [id number] DOB:[birth year], 67 y.o., female Today's Date: 10/05/2022    END OF SESSION:  PT End of Session - 10/05/22 1018     Visit Number 12    Number of Visits 16    Date for PT Re-Evaluation 10/18/22    PT Start Time 1016    PT Stop Time 1057    PT Time Calculation (min) 41 min                Past Medical History:  Diagnosis Date   Abnormal Pap smear, atypical squamous cells of undetermined sign (ASC-US) 1998   treated with cryo, CIN I   ADD (attention deficit disorder) 02-23-12   easily distracted.   Arthritis 02-23-12   Rt. hip and lt. arm   Closed displaced fracture of fifth metatarsal bone of left foot 03/31/2017   Depression with anxiety    History of osteoporosis    Kidney calculi 07/2015   Kidney stone    PONV (postoperative nausea and vomiting) 02-23-12   always has PONV   Post-nasal drainage 02-23-12   frequent a problem,causes a cough and mucus buildup in throat   Urethral polyp 1998   Dr. Hartley Barefoot   Varicose veins    Venous insufficiency    chronic-denies any problems   Past Surgical History:  Procedure Laterality Date   blood clot removed Left 01/2012   blood clot removed from left hand    BREAST BIOPSY Right 02/14/2012   Fibroadenoma   COLONOSCOPY  08/2003   repeat 5 yrs.   COLONOSCOPY W/ BIOPSIES  12/2008   1 polyp recheck 5 yrs   EXCISION/RELEASE BURSA HIP  02/29/2012   HAND SURGERY  02/23/2012   "blood clot" excised palm left hand 01-18-12   KNEE ARTHROSCOPY W/ MENISCAL REPAIR  10/2012   KNEE ARTHROSCOPY WITH EXCISION BAKER'S CYST Left 10/2012   meniscal repair   ORIF METATARSAL FRACTURE Left 04/07/2017   Left 5th Metatarsal   SHOULDER SURGERY Right 02/2015   Dr. 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   Patient Active Problem List   Diagnosis Date Noted   IBS (irritable bowel syndrome  with diarrhea) 08/17/2022   Hearing aid worn 02/10/2022   Frequent falls 11/26/2020   Depression with anxiety    History of osteoporosis    Age-related osteoporosis without current pathological fracture 07/29/2020   Osteoporosis 07/27/2020   Rhinitis 03/18/2020   Seborrheic keratosis 11/23/2017   S/P TKR (total knee replacement), left 08/25/2016   Benign paroxysmal positional vertigo 08/25/2016   Mid back pain, chronic 12/16/2015   Cervical strain 11/21/2014   DDD (degenerative disc disease), lumbar 02/17/2014   Vitamin D deficiency 03/18/2013   Trochanteric bursitis of right hip 02/29/2012   INSOMNIA 04/13/2010   Hyperlipidemia 09/17/2007   CERVICAL POLYP 09/17/2007   Attention deficit disorder 09/11/2007   UNSPECIFIED VENOUS INSUFFICIENCY 08/29/2007    PCP: Beatrice Lecher  REFERRING PROVIDER: Lemont Fillers DIAG: Z47.1: Aftercare following joint replacement surgery; [phone] presence of left artificial knee joint  THERAPY DIAG:  Chronic pain of left knee  Muscle weakness (generalized)  Difficulty in walking, not elsewhere classified  Repeated falls  Rationale for Evaluation and Treatment: Rehabilitation  ONSET DATE: ~4 years ago  SUBJECTIVE:   SUBJECTIVE STATEMENT: Patient reports she visited a friend who lived on the third story with  no elevator, states the knee pain was 7/10 worse ascending the stairs.   PERTINENT HISTORY: L TKA 4 years ago, L hip surgery, hx of L baker's cyst (removed) From eval: Pt reports L knee pain. Pt had L TKA 4 years ago. Pt had scar tissue removed May 24, 2022 but still getting pain. Pt has been going to pool and doing exercises. Pt has been walking. Has been using voltaren on her knee.   PAIN:  Are you having pain? Yes: NPRS scale: 1/10 Pain location: anterior knee Pain description: Can be sharp, achy and dull Aggravating factors: Sit to stand, stairs, and getting out of bed; fatigue Relieving factors: Water  therapy  PRECAUTIONS: None  WEIGHT BEARING RESTRICTIONS: No  FALLS:  Has patient fallen in last 6 months? Yes. Number of falls 5 or 6 times (normally to the left), last fall in October  OCCUPATION: Teaches art 2 days/wk  PATIENT GOALS: Improve knee pain with mobility  NEXT MD VISIT: n/a  OBJECTIVE:  From eval unless otherwise noted  PALPATION: TTP L anterior gastroc, patellar tendon, medial and lateral knee joint line  LOWER EXTREMITY ROM:  Active ROM Right eval Left eval Right 1/10 Left  1/10  Knee flexion 120 115 130 122  Knee extension 0 0     (Blank rows = not tested)  LOWER EXTREMITY MMT:  MMT Right eval Left eval Right 1/10 Left 1/10  Hip flexion 5 3+ 4 4-  Hip extension 3 3 3+ 4-  Hip abduction 4- 3 4+ 4-  Hip adduction 5 4-  4+  Hip internal rotation      Hip external rotation      Knee flexion 5 5    Knee extension 5 5    Ankle dorsiflexion 5 5    Ankle plantarflexion 5 5    Ankle inversion 5 4    Ankle eversion 5 5     (Blank rows = not tested)  LOWER EXTREMITY SPECIAL TESTS:  Knee special tests: Patellafemoral apprehension test: positive   FUNCTIONAL TESTS:  SLS on R 4 sec, on L attempts but unable to hold R LE back tandem 30 sec, L LE back tandem 30 sec Painful sit to stand and squatting with increased L knee valgus noted Unable to perform stairs with reciprocal pattern -- L knee valgus noted with ascent and descent  GAIT: Distance walked: 150 Assistive device utilized: None Level of assistance: Complete Independence Comments: antalgic, decreased stance on L, trendelenburg pattern   [facility] Adult PT Treatment:                                                DATE: 10/05/2022 Therapeutic Exercise: NuStep warm up L6 (UE 10/LE) + subjective intake x 62mn S/L clamshells 3x10 Bent knee fall out GTB x15 Prone hip extension Leg press: DL 95# 2x5, 65# x10; L SL 40# 2x5 Standing hip flexion YTB L 2x10 Low step up --> high step up L SLS Squat  (kickstand R) 2x10 Modified L single leg squat Ascend/descend stairs Step platform L2 - L1 lateral step up/down    [facility] Adult PT Treatment:  DATE: 09/28/2022 Therapeutic Exercise: Knee flexion AROM measurements  Hip MMT Leg Press (cueing abdominal bracing): DL 90# 2x10, SL 40# x8 B Dynamic resisted hip flexion (unilateral march) YTB R x10, L 2x10 Therapeutic Activity: Subjective intake on progress Assessment of lifting body mechancis/knee alignment Ascend/descend stairs FOTO   [facility] Adult PT Treatment:                                                DATE: 09/16/22 Pt seen for aquatic therapy today.  Treatment took place in water 3.25-4.5 ft in depth at the [facility]. Temp of water was 91.  Pt entered/exited the pool via stairs with step-to with bilat rail.  * warm up forward/ backward walking and side stepping * single yellow hand float under water at side, walking forward/ backward with core engaged * UE support with yellow hand floats:  bilat heel raises x 15, unilateral heel raises x 10 each; SLS with leg swings front/back x 10, hip abdct/ addct x 10 x 2 * back against wall with single leg press with thin square noodle x 20 LLE, x 10 RLE (progressed from back on wall to single UE on wall) * L forward step ups x 10, eccentric lowering * fig4 stretch holding rails x 20x x 2 LLE, x 1 RLE * foot on 2nd step - forward lunge for L knee ROM/ back for hamstring stretch x 10  * Lt step ups on 2nd step x 4 reps with heavy UE assist, cues for knee alignment * straddling yellow noodle:  cycling, cc ski, jumping jack LE with LE suspended * after dried off: applied reg Rock tape in horseshoe pattern to Lt ant knee for decompression of tissue and increased proprioception  Pt requires the buoyancy and hydrostatic pressure of water for support, and to offload joints by unweighting joint load by at least 50 % in navel deep water and by at  least 75-80% in chest to neck deep water.  Viscosity of the water is needed for resistance of strengthening. Water current perturbations provides challenge to standing balance requiring increased core activation.   PATIENT EDUCATION:  Education details: Mindful of knee stability mechanics (no rotation) Person educated: Patient Education method: Explanation, Demonstration, and Handouts Education comprehension: verbalized understanding, returned demonstration, and needs further education  HOME EXERCISE PROGRAM: Access Code: [email or web address] URL: [email or web address] Date: 09/14/2022 Prepared by: Estill Bamberg April Thurnell Garbe  Exercises - Hip Abduction with Resistance Loop  - 1 x daily - 7 x weekly - 2 sets - 10 reps - Hip Extension with Resistance Loop  - 1 x daily - 7 x weekly - 2 sets - 10 reps - Seated Piriformis Stretch  - 1 x daily - 7 x weekly - 3 sets - 3 reps - 30 sec hold - Sit to Stand with Armchair  - 1 x daily - 7 x weekly - 3 sets - 10 reps - Tandem Stance with Chair Support  - 1 x daily - 7 x weekly - 1 sets - 3 reps - 30 sec hold - Forward Step Down Touch with Heel  - 1 x daily - 7 x weekly - 2 sets - 10 reps   ASSESSMENT:  CLINICAL IMPRESSION: Patient continued to have increased L anterior knee pain when ascending/descending stairs. Modified single leg squats performed to progress increased weight bearing  in L single leg stance and functional strength; cueing improved body mechanics and improved posterior weight shifting.Mild instability (valgus) noted on L during lateral step down.   OBJECTIVE IMPAIRMENTS: Abnormal gait, decreased activity tolerance, decreased balance, decreased endurance, decreased mobility, difficulty walking, decreased ROM, decreased strength, increased fascial restrictions, increased muscle spasms, impaired sensation, improper body mechanics, and pain.    GOALS: Goals reviewed with patient? Yes  SHORT TERM GOALS: Target date: 09/21/2022   Pt  will be ind with initial HEP Baseline: Goal status: IN PROGRESS 1/3: MET    LONG TERM GOALS: Target date: 10/19/2022   Pt will be ind with advancement and progression of HEP Baseline:  Goal status: MET 1/10   2.  Pt will report improved pain by at least 50% with stair ascent/descent Baseline:  Goal status: 1/10: PROGRESSING (L>R descend)  3.  Pt will be able to perform 5x STS without UE support to demo improved functional LE strength Baseline:  Goal status: MET 1/10  4.  Pt will demo improved body mechanics with less knee valgus during lifting Baseline:  Goal status: MET 1/10  5.  Pt will have improved FOTO score to >/= 67 Baseline:  Goal status: IN PROGRESS 1/10 -- 52 1/10: PROGRESSING (52)    PLAN:  PT FREQUENCY: 2x/week  PT DURATION: 8 weeks  PLANNED INTERVENTIONS: Therapeutic exercises, Therapeutic activity, Neuromuscular re-education, Balance training, Gait training, Patient/Family education, Self Care, Joint mobilization, Stair training, Orthotic/Fit training, Aquatic Therapy, Dry Needling, Electrical stimulation, Cryotherapy, Moist heat, Taping, Vasopneumatic device, Ultrasound, Ionotophoresis '4mg'$ /ml Dexamethasone, Manual therapy, and Re-evaluation  PLAN FOR NEXT SESSION: Progress glute/hip strengthening. Work on Economist with stairs and lifting.    Helane Gunther, PTA 10/05/2022

## 2022-10-07 ENCOUNTER — Ambulatory Visit: Payer: Medicare Other

## 2022-10-07 DIAGNOSIS — R296 Repeated falls: Secondary | ICD-10-CM | POA: Diagnosis not present

## 2022-10-07 DIAGNOSIS — M25562 Pain in left knee: Secondary | ICD-10-CM | POA: Diagnosis not present

## 2022-10-07 DIAGNOSIS — G8929 Other chronic pain: Secondary | ICD-10-CM | POA: Diagnosis not present

## 2022-10-07 DIAGNOSIS — M6281 Muscle weakness (generalized): Secondary | ICD-10-CM

## 2022-10-07 DIAGNOSIS — R262 Difficulty in walking, not elsewhere classified: Secondary | ICD-10-CM

## 2022-10-07 NOTE — Therapy (Signed)
OUTPATIENT PHYSICAL THERAPY TREATMENT   Patient Name: VIRDA BETTERS MRN: [id number] DOB:11/26/1955, 67 y.o., female Today's Date: 10/07/2022    END OF SESSION:  PT End of Session - 10/07/22 1020     Visit Number 13    Number of Visits 16    Date for PT Re-Evaluation 10/18/22    PT Start Time 1017    PT Stop Time 1101    PT Time Calculation (min) 44 min                Past Medical History:  Diagnosis Date   Abnormal Pap smear, atypical squamous cells of undetermined sign (ASC-US) 1998   treated with cryo, CIN I   ADD (attention deficit disorder) 02-23-12   easily distracted.   Arthritis 02-23-12   Rt. hip and lt. arm   Closed displaced fracture of fifth metatarsal bone of left foot 03/31/2017   Depression with anxiety    History of osteoporosis    Kidney calculi 07/2015   Kidney stone    PONV (postoperative nausea and vomiting) 02-23-12   always has PONV   Post-nasal drainage 02-23-12   frequent a problem,causes a cough and mucus buildup in throat   Urethral polyp 1998   Dr. Hartley Barefoot   Varicose veins    Venous insufficiency    chronic-denies any problems   Past Surgical History:  Procedure Laterality Date   blood clot removed Left 01/2012   blood clot removed from left hand    BREAST BIOPSY Right 02/14/2012   Fibroadenoma   COLONOSCOPY  08/2003   repeat 5 yrs.   COLONOSCOPY W/ BIOPSIES  12/2008   1 polyp recheck 5 yrs   EXCISION/RELEASE BURSA HIP  02/29/2012   HAND SURGERY  02/23/2012   "blood clot" excised palm left hand 01-18-12   KNEE ARTHROSCOPY W/ MENISCAL REPAIR  10/2012   KNEE ARTHROSCOPY WITH EXCISION BAKER'S CYST Left 10/2012   meniscal repair   ORIF METATARSAL FRACTURE Left 04/07/2017   Left 5th Metatarsal   SHOULDER SURGERY Right 02/2015   Dr. 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   Patient Active Problem List   Diagnosis Date Noted   IBS (irritable bowel syndrome  with diarrhea) 08/17/2022   Hearing aid worn 02/10/2022   Trochanteric bursitis of left hip 02/06/2022   Frequent falls 11/26/2020   Depression with anxiety    History of osteoporosis    Age-related osteoporosis without current pathological fracture 07/29/2020   Osteoporosis 07/27/2020   Rhinitis 03/18/2020   Pain in left knee 09/07/2018   Seborrheic keratosis 11/23/2017   S/P TKR (total knee replacement), left 08/25/2016   Benign paroxysmal positional vertigo 08/25/2016   Mid back pain, chronic 12/16/2015   Cervical strain 11/21/2014   DDD (degenerative disc disease), lumbar 02/17/2014   Vitamin D deficiency 03/18/2013   Trochanteric bursitis of right hip 02/29/2012   INSOMNIA 04/13/2010   Hyperlipidemia 09/17/2007   CERVICAL POLYP 09/17/2007   Attention deficit disorder 09/11/2007   UNSPECIFIED VENOUS INSUFFICIENCY 08/29/2007    PCP: Beatrice Lecher  REFERRING PROVIDER: Lemont Fillers DIAG: Z47.1: Aftercare following joint replacement surgery; [phone] presence of left artificial knee joint  THERAPY DIAG:  Chronic pain of left knee  Muscle weakness (generalized)  Difficulty in walking, not elsewhere classified  Repeated falls  Rationale for Evaluation and Treatment: Rehabilitation  ONSET DATE: ~4 years ago  SUBJECTIVE:  SUBJECTIVE STATEMENT: Patient reports she has an appointment with ortho MD on 1/24 after next therapy appointment. Patient states her knee has been feeling since last visit.  PERTINENT HISTORY: L TKA 4 years ago, L hip surgery, hx of L baker's cyst (removed) From eval: Pt reports L knee pain. Pt had L TKA 4 years ago. Pt had scar tissue removed May 24, 2022 but still getting pain. Pt has been going to pool and doing exercises. Pt has been walking. Has been using voltaren on her knee.   PAIN:  Are you having pain? Yes: NPRS scale: 1/10 Pain location: anterior knee Pain description: Can be sharp, achy and dull Aggravating  factors: Sit to stand, stairs, and getting out of bed; fatigue Relieving factors: Water therapy  PRECAUTIONS: None  WEIGHT BEARING RESTRICTIONS: No  FALLS:  Has patient fallen in last 6 months? Yes. Number of falls 5 or 6 times (normally to the left), last fall in October  OCCUPATION: Teaches art 2 days/wk  PATIENT GOALS: Improve knee pain with mobility  NEXT MD VISIT: n/a  OBJECTIVE:  From eval unless otherwise noted  PALPATION: TTP L anterior gastroc, patellar tendon, medial and lateral knee joint line  LOWER EXTREMITY ROM:  Active ROM Right eval Left eval Right 1/10 Left  1/10  Knee flexion 120 115 130 122  Knee extension 0 0     (Blank rows = not tested)  LOWER EXTREMITY MMT:  MMT Right eval Left eval Right 1/10 Left 1/10  Hip flexion 5 3+ 4 4-  Hip extension 3 3 3+ 4-  Hip abduction 4- 3 4+ 4-  Hip adduction 5 4-  4+  Hip internal rotation      Hip external rotation      Knee flexion 5 5    Knee extension 5 5    Ankle dorsiflexion 5 5    Ankle plantarflexion 5 5    Ankle inversion 5 4    Ankle eversion 5 5     (Blank rows = not tested)  LOWER EXTREMITY SPECIAL TESTS:  Knee special tests: Patellafemoral apprehension test: positive   FUNCTIONAL TESTS:  SLS on R 4 sec, on L attempts but unable to hold R LE back tandem 30 sec, L LE back tandem 30 sec Painful sit to stand and squatting with increased L knee valgus noted Unable to perform stairs with reciprocal pattern -- L knee valgus noted with ascent and descent  GAIT: Distance walked: 150 Assistive device utilized: None Level of assistance: Complete Independence Comments: antalgic, decreased stance on L, trendelenburg pattern   [facility] Adult PT Treatment:                                                DATE: 10/07/2022 Therapeutic Exercise: Recubment bike L3 warm-up x3 min, L1 x69mn S/L clamshells 2x10 YTB  S/L straight leg glute med leg raises 2x10 Bridges x5 --> staggered stance (R forward)  2x10 Standing hydrant --> hip abd L lateral lunges w/slider Bent knee side stepping GTB above knees L SLS mini squats (YTB resistance at lateral knee) Leg Press: DL 90# 2x10 --> L SL 40# 3x5 Prone quad stretch w/strap (discontinued d/t pain) Seated self-massage with roller along lateral L leg    [facility] Adult PT Treatment:  DATE: 10/05/2022 Therapeutic Exercise: NuStep warm up L6 (UE 10/LE) + subjective intake x 48mn S/L clamshells 3x10 Bent knee fall out GTB x15 Prone hip extension Leg press: DL 95# 2x5, 65# x10; L SL 40# 2x5 Standing hip flexion YTB L 2x10 Low step up --> high step up L SLS Squat (kickstand R) 2x10 Modified L single leg squat Ascend/descend stairs Step platform L2 - L1 lateral step up/down   [facility] Adult PT Treatment:                                                DATE: 09/16/22 Pt seen for aquatic therapy today.  Treatment took place in water 3.25-4.5 ft in depth at the [facility] Temp of water was 91.  Pt entered/exited the pool via stairs with step-to with bilat rail.  * warm up forward/ backward walking and side stepping * single yellow hand float under water at side, walking forward/ backward with core engaged * UE support with yellow hand floats:  bilat heel raises x 15, unilateral heel raises x 10 each; SLS with leg swings front/back x 10, hip abdct/ addct x 10 x 2 * back against wall with single leg press with thin square noodle x 20 LLE, x 10 RLE (progressed from back on wall to single UE on wall) * L forward step ups x 10, eccentric lowering * fig4 stretch holding rails x 20x x 2 LLE, x 1 RLE * foot on 2nd step - forward lunge for L knee ROM/ back for hamstring stretch x 10  * Lt step ups on 2nd step x 4 reps with heavy UE assist, cues for knee alignment * straddling yellow noodle:  cycling, cc ski, jumping jack LE with LE suspended * after dried off: applied reg Rock tape in horseshoe pattern  to Lt ant knee for decompression of tissue and increased proprioception  Pt requires the buoyancy and hydrostatic pressure of water for support, and to offload joints by unweighting joint load by at least 50 % in navel deep water and by at least 75-80% in chest to neck deep water.  Viscosity of the water is needed for resistance of strengthening. Water current perturbations provides challenge to standing balance requiring increased core activation.   PATIENT EDUCATION:  Education details: Mindful of knee stability mechanics (no rotation) Person educated: Patient Education method: Explanation, Demonstration, and Handouts Education comprehension: verbalized understanding, returned demonstration, and needs further education  HOME EXERCISE PROGRAM: Access Code: [email or web address] URL: [email or web address] Date: 09/14/2022 Prepared by: GEstill BambergApril MThurnell Garbe Exercises - Hip Abduction with Resistance Loop  - 1 x daily - 7 x weekly - 2 sets - 10 reps - Hip Extension with Resistance Loop  - 1 x daily - 7 x weekly - 2 sets - 10 reps - Seated Piriformis Stretch  - 1 x daily - 7 x weekly - 3 sets - 3 reps - 30 sec hold - Sit to Stand with Armchair  - 1 x daily - 7 x weekly - 3 sets - 10 reps - Tandem Stance with Chair Support  - 1 x daily - 7 x weekly - 1 sets - 3 reps - 30 sec hold - Forward Step Down Touch with Heel  - 1 x daily - 7 x weekly - 2 sets - 10 reps   ASSESSMENT:  CLINICAL  IMPRESSION: Tactile cues improved ankle stability and lateral knee alignment during single leg press; moderate fatigue reported in lateral quad after exercise. Initial mild internal knee rotation instability noted during single leg mini squat; light resistance band placed at lateral knee to promote stabilization.   OBJECTIVE IMPAIRMENTS: Abnormal gait, decreased activity tolerance, decreased balance, decreased endurance, decreased mobility, difficulty walking, decreased ROM, decreased strength, increased  fascial restrictions, increased muscle spasms, impaired sensation, improper body mechanics, and pain.    GOALS: Goals reviewed with patient? Yes  SHORT TERM GOALS: Target date: 09/21/2022   Pt will be ind with initial HEP Baseline: Goal status: IN PROGRESS 1/3: MET    LONG TERM GOALS: Target date: 10/19/2022   Pt will be ind with advancement and progression of HEP Baseline:  Goal status: MET 1/10   2.  Pt will report improved pain by at least 50% with stair ascent/descent Baseline:  Goal status: 1/10: PROGRESSING (L>R descend)  3.  Pt will be able to perform 5x STS without UE support to demo improved functional LE strength Baseline:  Goal status: MET 1/10  4.  Pt will demo improved body mechanics with less knee valgus during lifting Baseline:  Goal status: MET 1/10  5.  Pt will have improved FOTO score to >/= 67 Baseline:  Goal status: IN PROGRESS 1/10 -- 52 1/10: PROGRESSING (52)    PLAN:  PT FREQUENCY: 2x/week  PT DURATION: 8 weeks  PLANNED INTERVENTIONS: Therapeutic exercises, Therapeutic activity, Neuromuscular re-education, Balance training, Gait training, Patient/Family education, Self Care, Joint mobilization, Stair training, Orthotic/Fit training, Aquatic Therapy, Dry Needling, Electrical stimulation, Cryotherapy, Moist heat, Taping, Vasopneumatic device, Ultrasound, Ionotophoresis '4mg'$ /ml Dexamethasone, Manual therapy, and Re-evaluation  PLAN FOR NEXT SESSION: Review Long Term Goals (update for ortho appt). Progress glute/hip strengthening. Work on Economist with stairs and lifting.    Helane Gunther, PTA 10/07/2022

## 2022-10-10 LAB — CYTOLOGY - PAP
Diagnosis: NEGATIVE
Diagnosis: REACTIVE

## 2022-10-12 ENCOUNTER — Ambulatory Visit: Payer: Medicare Other

## 2022-10-12 ENCOUNTER — Ambulatory Visit (INDEPENDENT_AMBULATORY_CARE_PROVIDER_SITE_OTHER): Payer: Medicare Other

## 2022-10-12 DIAGNOSIS — R262 Difficulty in walking, not elsewhere classified: Secondary | ICD-10-CM | POA: Diagnosis not present

## 2022-10-12 DIAGNOSIS — R296 Repeated falls: Secondary | ICD-10-CM

## 2022-10-12 DIAGNOSIS — Z1231 Encounter for screening mammogram for malignant neoplasm of breast: Secondary | ICD-10-CM | POA: Diagnosis not present

## 2022-10-12 DIAGNOSIS — G8929 Other chronic pain: Secondary | ICD-10-CM | POA: Diagnosis not present

## 2022-10-12 DIAGNOSIS — M6281 Muscle weakness (generalized): Secondary | ICD-10-CM

## 2022-10-12 DIAGNOSIS — M25562 Pain in left knee: Secondary | ICD-10-CM | POA: Diagnosis not present

## 2022-10-12 NOTE — Therapy (Signed)
OUTPATIENT PHYSICAL THERAPY TREATMENT   Patient Name: ELLORA VARNUM MRN: [id number] DOB:1956/02/07, 67 y.o., female Today's Date: 10/12/2022    END OF SESSION:  PT End of Session - 10/12/22 1015     Visit Number 14    Number of Visits 16    Date for PT Re-Evaluation 10/18/22    Authorization Type BCBS Medicare    PT Start Time 1016    PT Stop Time 1104    PT Time Calculation (min) 48 min    Activity Tolerance Patient tolerated treatment well    Behavior During Therapy WFL for tasks assessed/performed                Past Medical History:  Diagnosis Date   Abnormal Pap smear, atypical squamous cells of undetermined sign (ASC-US) 1998   treated with cryo, CIN I   ADD (attention deficit disorder) 02-23-12   easily distracted.   Arthritis 02-23-12   Rt. hip and lt. arm   Closed displaced fracture of fifth metatarsal bone of left foot 03/31/2017   Depression with anxiety    History of osteoporosis    Kidney calculi 07/2015   Kidney stone    PONV (postoperative nausea and vomiting) 02-23-12   always has PONV   Post-nasal drainage 02-23-12   frequent a problem,causes a cough and mucus buildup in throat   Urethral polyp 1998   Dr. Hartley Barefoot   Varicose veins    Venous insufficiency    chronic-denies any problems   Past Surgical History:  Procedure Laterality Date   blood clot removed Left 01/2012   blood clot removed from left hand    BREAST BIOPSY Right 02/14/2012   Fibroadenoma   COLONOSCOPY  08/2003   repeat 5 yrs.   COLONOSCOPY W/ BIOPSIES  12/2008   1 polyp recheck 5 yrs   EXCISION/RELEASE BURSA HIP  02/29/2012   HAND SURGERY  02/23/2012   "blood clot" excised palm left hand 01-18-12   KNEE ARTHROSCOPY W/ MENISCAL REPAIR  10/2012   KNEE ARTHROSCOPY WITH EXCISION BAKER'S CYST Left 10/2012   meniscal repair   ORIF METATARSAL FRACTURE Left 04/07/2017   Left 5th Metatarsal   SHOULDER SURGERY Right 02/2015   Dr. 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   Patient Active Problem List   Diagnosis Date Noted   IBS (irritable bowel syndrome with diarrhea) 08/17/2022   Hearing aid worn 02/10/2022   Trochanteric bursitis of left hip 02/06/2022   Frequent falls 11/26/2020   Depression with anxiety    History of osteoporosis    Age-related osteoporosis without current pathological fracture 07/29/2020   Osteoporosis 07/27/2020   Rhinitis 03/18/2020   Pain in left knee 09/07/2018   Seborrheic keratosis 11/23/2017   S/P TKR (total knee replacement), left 08/25/2016   Benign paroxysmal positional vertigo 08/25/2016   Mid back pain, chronic 12/16/2015   Cervical strain 11/21/2014   DDD (degenerative disc disease), lumbar 02/17/2014   Vitamin D deficiency 03/18/2013   Trochanteric bursitis of right hip 02/29/2012   INSOMNIA 04/13/2010   Hyperlipidemia 09/17/2007   CERVICAL POLYP 09/17/2007   Attention deficit disorder 09/11/2007   UNSPECIFIED VENOUS INSUFFICIENCY 08/29/2007    PCP: Beatrice Lecher  REFERRING PROVIDER: Lemont Fillers DIAG: Z47.1: Aftercare following joint replacement surgery; [phone] presence of left artificial knee joint  THERAPY DIAG:  Chronic pain of left knee  Muscle weakness (generalized)  Difficulty in walking, not elsewhere classified  Repeated falls  Rationale for Evaluation and Treatment: Rehabilitation  ONSET DATE: ~4 years ago  SUBJECTIVE:   SUBJECTIVE STATEMENT: Patient reports no pain today. Patient states she was sore along lateral quad after last visit, states she used heat and gentle stretching and massage to address mild pain in thigh over the weekend.  PERTINENT HISTORY: L TKA 4 years ago, L hip surgery, hx of L baker's cyst (removed) From eval: Pt reports L knee pain. Pt had L TKA 4 years ago. Pt had scar tissue removed May 24, 2022 but still getting pain. Pt has been going to pool and doing exercises. Pt  has been walking. Has been using voltaren on her knee.   PAIN:  Are you having pain? Yes: NPRS scale: 0/10 Pain location: anterior knee Pain description: Can be sharp, achy and dull Aggravating factors: Sit to stand, stairs, and getting out of bed; fatigue Relieving factors: Water therapy  PRECAUTIONS: None  WEIGHT BEARING RESTRICTIONS: No  FALLS:  Has patient fallen in last 6 months? Yes. Number of falls 5 or 6 times (normally to the left), last fall in October  OCCUPATION: Teaches art 2 days/wk  PATIENT GOALS: Improve knee pain with mobility  NEXT MD VISIT: n/a  OBJECTIVE:  From eval unless otherwise noted  PALPATION: TTP L anterior gastroc, patellar tendon, medial and lateral knee joint line  LOWER EXTREMITY ROM:  Active ROM Right eval Left eval Right 1/10 Left  1/10  Knee flexion 120 115 130 122  Knee extension 0 0     (Blank rows = not tested)  LOWER EXTREMITY MMT:  MMT Right eval Left eval Right 1/10 Left 1/10  Hip flexion 5 3+ 4 4-  Hip extension 3 3 3+ 4-  Hip abduction 4- 3 4+ 4-  Hip adduction 5 4-  4+  Hip internal rotation      Hip external rotation      Knee flexion 5 5    Knee extension 5 5    Ankle dorsiflexion 5 5    Ankle plantarflexion 5 5    Ankle inversion 5 4    Ankle eversion 5 5     (Blank rows = not tested)  LOWER EXTREMITY SPECIAL TESTS:  Knee special tests: Patellafemoral apprehension test: positive   FUNCTIONAL TESTS:  SLS on R 4 sec, on L attempts but unable to hold R LE back tandem 30 sec, L LE back tandem 30 sec Painful sit to stand and squatting with increased L knee valgus noted Unable to perform stairs with reciprocal pattern -- L knee valgus noted with ascent and descent  GAIT: Distance walked: 150 Assistive device utilized: None Level of assistance: Complete Independence Comments: antalgic, decreased stance on L, trendelenburg pattern    [facility] Adult PT Treatment:                                                 DATE: 10/12/2022 Therapeutic Exercise: Recumbent bike warm-up L3 x 45mn Ascend/descend stairs (mild pain lower lateral knee) L SLR small ROM parallel x8, ER x6 S/L clamshells YTB 2x12 B S/L bent knee hip abd YTB 2x10 B Bridges with RTB above knees x20 Bent knee fall out (focus on L stability) 2x10 Lunge stretch on stairs --> standing crossed leg ITB stretch STS hands on thighs -->  cradling 10#KB --> added overhead press 10#KB in standing Bent knee side stepping GTB above knees 4L at counter Self-myofascial massage L quad (prone, small ball)   [facility] Adult PT Treatment:                                                DATE: 10/07/2022 Therapeutic Exercise: Recubment bike L3 warm-up x3 min, L1 x42mn S/L clamshells 2x10 YTB  S/L straight leg glute med leg raises 2x10 Bridges x5 --> staggered stance (R forward) 2x10 Standing hydrant --> hip abd L lateral lunges w/slider Bent knee side stepping GTB above knees L SLS mini squats (YTB resistance at lateral knee) Leg Press: DL 90# 2x10 --> L SL 40# 3x5 Prone quad stretch w/strap (discontinued d/t pain) Seated self-massage with roller along lateral L leg    [facility] Adult PT Treatment:                                                DATE: 09/16/22 Pt seen for aquatic therapy today.  Treatment took place in water 3.25-4.5 ft [place] Temp of water was 91.  Pt entered/exited the pool via stairs with step-to with bilat rail.  * warm up forward/ backward walking and side stepping * single yellow hand float under water at side, walking forward/ backward with core engaged * UE support with yellow hand floats:  bilat heel raises x 15, unilateral heel raises x 10 each; SLS with leg swings front/back x 10, hip abdct/ addct x 10 x 2 * back against wall with single leg press with thin square noodle x 20 LLE, x 10 RLE (progressed from back on wall to single UE on wall) * L forward step ups x 10, eccentric lowering * fig4  stretch holding rails x 20x x 2 LLE, x 1 RLE * foot on 2nd step - forward lunge for L knee ROM/ back for hamstring stretch x 10  * Lt step ups on 2nd step x 4 reps with heavy UE assist, cues for knee alignment * straddling yellow noodle:  cycling, cc ski, jumping jack LE with LE suspended * after dried off: applied reg Rock tape in horseshoe pattern to Lt ant knee for decompression of tissue and increased proprioception  Pt requires the buoyancy and hydrostatic pressure of water for support, and to offload joints by unweighting joint load by at least 50 % in navel deep water and by at least 75-80% in chest to neck deep water.  Viscosity of the water is needed for resistance of strengthening. Water current perturbations provides challenge to standing balance requiring increased core activation.   PATIENT EDUCATION:  Education details: Mindful of knee stability mechanics (no rotation) Person educated: Patient Education method: Explanation, Demonstration, and Handouts Education comprehension: verbalized understanding, returned demonstration, and needs further education  HOME EXERCISE PROGRAM: Access Code: [email or web address] URL: [email or web address] Date: 09/14/2022 Prepared by: GEstill BambergApril MThurnell Garbe Exercises - Hip Abduction with Resistance Loop  - 1 x daily - 7 x weekly - 2 sets - 10 reps - Hip Extension with Resistance Loop  - 1 x daily - 7 x weekly - 2 sets - 10 reps - Seated Piriformis Stretch  -  1 x daily - 7 x weekly - 3 sets - 3 reps - 30 sec hold - Sit to Stand with Armchair  - 1 x daily - 7 x weekly - 3 sets - 10 reps - Tandem Stance with Chair Support  - 1 x daily - 7 x weekly - 1 sets - 3 reps - 30 sec hold - Forward Step Down Touch with Heel  - 1 x daily - 7 x weekly - 2 sets - 10 reps   ASSESSMENT:  CLINICAL IMPRESSION: Patient reported mild pain along lower lateral L knee when descending stairs and 50% overall improvement in pain when ascending/descending  stairs. Patient demonstrated improved knee and hip stabilization during repeated sit to stands; noted mild L knee valgus with fatigue towards last few repetitions. Patient instructed in self-myofascial release for L quad to address tension and and muscular tightness. Continued increased tenderness with palpitation along patellor tendon region.   OBJECTIVE IMPAIRMENTS: Abnormal gait, decreased activity tolerance, decreased balance, decreased endurance, decreased mobility, difficulty walking, decreased ROM, decreased strength, increased fascial restrictions, increased muscle spasms, impaired sensation, improper body mechanics, and pain.    GOALS: Goals reviewed with patient? Yes  SHORT TERM GOALS: Target date: 09/21/2022   Pt will be ind with initial HEP Baseline: Goal status: IN PROGRESS 1/3: MET   LONG TERM GOALS: Target date: 10/19/2022   Pt will be ind with advancement and progression of HEP Baseline:  Goal status: MET 1/10   2.  Pt will report improved pain by at least 50% with stair ascent/descent Baseline:  Goal status: 1/10: PROGRESSING (L>R descend) Goal status: MET 1/24  3.  Pt will be able to perform 5x STS without UE support to demo improved functional LE strength Baseline:  Goal status: MET 1/10  4.  Pt will demo improved body mechanics with less knee valgus during lifting Baseline:  Goal status: MET 1/10  5.  Pt will have improved FOTO score to >/= 67 Baseline:  Goal status: IN PROGRESS 1/10 -- 52 1/10: PROGRESSING (52) Goal status:     PLAN:  PT FREQUENCY: 2x/week  PT DURATION: 8 weeks  PLANNED INTERVENTIONS: Therapeutic exercises, Therapeutic activity, Neuromuscular re-education, Balance training, Gait training, Patient/Family education, Self Care, Joint mobilization, Stair training, Orthotic/Fit training, Aquatic Therapy, Dry Needling, Electrical stimulation, Cryotherapy, Moist heat, Taping, Vasopneumatic device, Ultrasound, Ionotophoresis '4mg'$ /ml  Dexamethasone, Manual therapy, and Re-evaluation  PLAN FOR NEXT SESSION: FOTO; Knee measurements, hip/knee MMT; Discharge or re-cert? Follow up with patient on ortho appt   Helane Gunther, PTA 10/12/2022

## 2022-10-14 ENCOUNTER — Other Ambulatory Visit: Payer: Self-pay | Admitting: Family Medicine

## 2022-10-14 ENCOUNTER — Encounter (HOSPITAL_BASED_OUTPATIENT_CLINIC_OR_DEPARTMENT_OTHER): Payer: Self-pay | Admitting: Physical Therapy

## 2022-10-14 ENCOUNTER — Ambulatory Visit (HOSPITAL_BASED_OUTPATIENT_CLINIC_OR_DEPARTMENT_OTHER): Payer: Medicare Other | Admitting: Physical Therapy

## 2022-10-14 DIAGNOSIS — M25562 Pain in left knee: Secondary | ICD-10-CM | POA: Diagnosis not present

## 2022-10-14 DIAGNOSIS — G8929 Other chronic pain: Secondary | ICD-10-CM | POA: Diagnosis not present

## 2022-10-14 DIAGNOSIS — M6281 Muscle weakness (generalized): Secondary | ICD-10-CM | POA: Diagnosis not present

## 2022-10-14 DIAGNOSIS — R928 Other abnormal and inconclusive findings on diagnostic imaging of breast: Secondary | ICD-10-CM

## 2022-10-14 DIAGNOSIS — R262 Difficulty in walking, not elsewhere classified: Secondary | ICD-10-CM | POA: Diagnosis not present

## 2022-10-14 NOTE — Progress Notes (Signed)
Baker Janus, the imaging department will be contacting you to do some additional images on the left breast.  Recommending further evaluation with ultrasound.

## 2022-10-14 NOTE — Therapy (Signed)
OUTPATIENT PHYSICAL THERAPY TREATMENT   Patient Name: Wanda Gonzales MRN: [id number] DOB:12/12/1955, 67 y.o., female Today's Date: 10/14/2022    END OF SESSION:  PT End of Session - 10/14/22 1043     Visit Number 15    Number of Visits 16    Date for PT Re-Evaluation 10/18/22    Authorization Type BCBS Medicare    Progress Note Due on Visit 20    PT Start Time 1035    PT Stop Time 1113    PT Time Calculation (min) 38 min    Activity Tolerance Patient tolerated treatment well    Behavior During Therapy WFL for tasks assessed/performed                Past Medical History:  Diagnosis Date   Abnormal Pap smear, atypical squamous cells of undetermined sign (ASC-US) 1998   treated with cryo, CIN I   ADD (attention deficit disorder) 02-23-12   easily distracted.   Arthritis 02-23-12   Rt. hip and lt. arm   Closed displaced fracture of fifth metatarsal bone of left foot 03/31/2017   Depression with anxiety    History of osteoporosis    Kidney calculi 07/2015   Kidney stone    PONV (postoperative nausea and vomiting) 02-23-12   always has PONV   Post-nasal drainage 02-23-12   frequent a problem,causes a cough and mucus buildup in throat   Urethral polyp 1998   Dr. Hartley Barefoot   Varicose veins    Venous insufficiency    chronic-denies any problems   Past Surgical History:  Procedure Laterality Date   blood clot removed Left 01/2012   blood clot removed from left hand    BREAST BIOPSY Right 02/14/2012   Fibroadenoma   COLONOSCOPY  08/2003   repeat 5 yrs.   COLONOSCOPY W/ BIOPSIES  12/2008   1 polyp recheck 5 yrs   EXCISION/RELEASE BURSA HIP  02/29/2012   HAND SURGERY  02/23/2012   "blood clot" excised palm left hand 01-18-12   KNEE ARTHROSCOPY W/ MENISCAL REPAIR  10/2012   KNEE ARTHROSCOPY WITH EXCISION BAKER'S CYST Left 10/2012   meniscal repair   ORIF METATARSAL FRACTURE Left 04/07/2017   Left 5th Metatarsal   SHOULDER SURGERY Right 02/2015   Dr. 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   Patient Active Problem List   Diagnosis Date Noted   IBS (irritable bowel syndrome with diarrhea) 08/17/2022   Hearing aid worn 02/10/2022   Trochanteric bursitis of left hip 02/06/2022   Frequent falls 11/26/2020   Depression with anxiety    History of osteoporosis    Age-related osteoporosis without current pathological fracture 07/29/2020   Osteoporosis 07/27/2020   Rhinitis 03/18/2020   Pain in left knee 09/07/2018   Seborrheic keratosis 11/23/2017   S/P TKR (total knee replacement), left 08/25/2016   Benign paroxysmal positional vertigo 08/25/2016   Mid back pain, chronic 12/16/2015   Cervical strain 11/21/2014   DDD (degenerative disc disease), lumbar 02/17/2014   Vitamin D deficiency 03/18/2013   Trochanteric bursitis of right hip 02/29/2012   INSOMNIA 04/13/2010   Hyperlipidemia 09/17/2007   CERVICAL POLYP 09/17/2007   Attention deficit disorder 09/11/2007   UNSPECIFIED VENOUS INSUFFICIENCY 08/29/2007    PCP: Beatrice Lecher  REFERRING PROVIDER: Lemont Fillers DIAG: Z47.1: Aftercare following joint replacement surgery; [phone] presence of left artificial knee joint  THERAPY DIAG:  Chronic  pain of left knee  Muscle weakness (generalized)  Difficulty in walking, not elsewhere classified  Rationale for Evaluation and Treatment: Rehabilitation  ONSET DATE: ~4 years ago  SUBJECTIVE:   SUBJECTIVE STATEMENT: Patient reports minimal pain today.  She had visit with PA at Dr. Anne Fu office 2 days ago.  She was started on steroids and if that doesn't help states she will get an injection.  She has some soreness in her Rt thigh as well today.   PERTINENT HISTORY: L TKA 4 years ago, L hip surgery, hx of L baker's cyst (removed) From eval: Pt reports L knee pain. Pt had L TKA 4 years ago. Pt had scar tissue removed May 24, 2022 but still getting  pain. Pt has been going to pool and doing exercises. Pt has been walking. Has been using voltaren on her knee.   PAIN:  Are you having pain? Yes: NPRS scale: 2/10 Pain location: Lt knee at joint line (medial/lateral) Pain description: Can be sharp, achy and dull Aggravating factors: Sit to stand, stairs, and getting out of bed; fatigue Relieving factors: Water therapy  PRECAUTIONS: None  WEIGHT BEARING RESTRICTIONS: No  FALLS:  Has patient fallen in last 6 months? Yes. Number of falls 5 or 6 times (normally to the left), last fall in October  OCCUPATION: Teaches art 2 days/wk  PATIENT GOALS: Improve knee pain with mobility  NEXT MD VISIT: n/a  OBJECTIVE:  From eval unless otherwise noted  PALPATION: TTP L anterior gastroc, patellar tendon, medial and lateral knee joint line  LOWER EXTREMITY ROM:  Active ROM Right eval Left eval Right 1/10 Left  1/10  Knee flexion 120 115 130 122  Knee extension 0 0     (Blank rows = not tested)  LOWER EXTREMITY MMT:  MMT Right eval Left eval Right 1/10 Left 1/10  Hip flexion 5 3+ 4 4-  Hip extension 3 3 3+ 4-  Hip abduction 4- 3 4+ 4-  Hip adduction 5 4-  4+  Hip internal rotation      Hip external rotation      Knee flexion 5 5    Knee extension 5 5    Ankle dorsiflexion 5 5    Ankle plantarflexion 5 5    Ankle inversion 5 4    Ankle eversion 5 5     (Blank rows = not tested)  LOWER EXTREMITY SPECIAL TESTS:  Knee special tests: Patellafemoral apprehension test: positive   FUNCTIONAL TESTS:  SLS on R 4 sec, on L attempts but unable to hold R LE back tandem 30 sec, L LE back tandem 30 sec Painful sit to stand and squatting with increased L knee valgus noted Unable to perform stairs with reciprocal pattern -- L knee valgus noted with ascent and descent  GAIT: Distance walked: 150 Assistive device utilized: None Level of assistance: Complete Independence Comments: antalgic, decreased stance on L, trendelenburg  pattern  [facility] Adult PT Treatment:                                                DATE: 10/14/22 Pt seen for aquatic therapy today.  Treatment took place in water 3.25-4.5 ft in depth at the [place]. Temp of water was 91.  Pt entered/exited the pool via stairs with step-to with bilat rail.  * warm up forward/ backward  walking and side stepping - multiple laps * L forward step ups x 10, eccentric lowering * SLS with solid noodle press/stomp with intermittent UE on wall x 10 each LE, then hip abdct/knee flexion x 10 with LLE (challenge) * holding noodle:  curtsy lunges (painful in Lt knee after a few reps-stopped); forward walking lunges - cues to correct valgus in Lt knee with flexion  * Holding noodle:  1/2 diamonds (single leg clams) ; heel raises x 10 * Lt quad stretch with ankle supported by solid noodle * foot on 2nd step -Lt  hamstring stretch x 45 sec   Pt requires the buoyancy and hydrostatic pressure of water for support, and to offload joints by unweighting joint load by at least 50 % in navel deep water and by at least 75-80% in chest to neck deep water.  Viscosity of the water is needed for resistance of strengthening. Water current perturbations provides challenge to standing balance requiring increased core activation.  [facility] Adult PT Treatment:                                                DATE: 10/12/2022 Therapeutic Exercise: Recumbent bike warm-up L3 x 69mn Ascend/descend stairs (mild pain lower lateral knee) L SLR small ROM parallel x8, ER x6 S/L clamshells YTB 2x12 B S/L bent knee hip abd YTB 2x10 B Bridges with RTB above knees x20 Bent knee fall out (focus on L stability) 2x10 Lunge stretch on stairs --> standing crossed leg ITB stretch STS hands on thighs --> cradling 10#KB --> added overhead press 10#KB in standing Bent knee side stepping GTB above knees 4L at counter Self-myofascial massage L quad (prone, small ball)   [facility] Adult PT Treatment:                                                 DATE: 10/07/2022 Therapeutic Exercise: Recubment bike L3 warm-up x3 min, L1 x217m S/L clamshells 2x10 YTB  S/L straight leg glute med leg raises 2x10 Bridges x5 --> staggered stance (R forward) 2x10 Standing hydrant --> hip abd L lateral lunges w/slider Bent knee side stepping GTB above knees L SLS mini squats (YTB resistance at lateral knee) Leg Press: DL 90# 2x10 --> L SL 40# 3x5 Prone quad stretch w/strap (discontinued d/t pain) Seated self-massage with roller along lateral L leg     PATIENT EDUCATION:  Education details: Mindful of knee stability mechanics (no rotation) Person educated: Patient Education method: Explanation, Demonstration, and Handouts Education comprehension: verbalized understanding, returned demonstration, and needs further education  HOME EXERCISE PROGRAM: Access Code: [email or web address] URL: [email or web address] Date: 09/14/2022 Prepared by: GeEstill Bambergpril MaThurnell GarbeExercises - Hip Abduction with Resistance Loop  - 1 x daily - 7 x weekly - 2 sets - 10 reps - Hip Extension with Resistance Loop  - 1 x daily - 7 x weekly - 2 sets - 10 reps - Seated Piriformis Stretch  - 1 x daily - 7 x weekly - 3 sets - 3 reps - 30 sec hold - Sit to Stand with Armchair  - 1 x daily - 7 x weekly - 3 sets - 10 reps - Tandem Stance with Chair Support  - 1  x daily - 7 x weekly - 1 sets - 3 reps - 30 sec hold - Forward Step Down Touch with Heel  - 1 x daily - 7 x weekly - 2 sets - 10 reps   ASSESSMENT:  CLINICAL IMPRESSION: Patient reported mild pain in L knee with lunges and initially with forward step ups; when correcting lower Lt leg to neutral position (vs valgus) pt reported no pain.  Pt had some Lt knee irritation after quad stretch with LE supported by noodle (had trouble attaining the position).  Encouraged self care this afternoon to avoid flare up of symptoms.  Discussed with supervising PT regarding extending pt's POC  out after next visit to establish an aquatic therapy program that she can continue on her own at local pool.  Pt to find pool that she will join prior to next pool visit.  Pt will be issued a laminated program prior to d/c. Pt near meeting remaining goals.    OBJECTIVE IMPAIRMENTS: Abnormal gait, decreased activity tolerance, decreased balance, decreased endurance, decreased mobility, difficulty walking, decreased ROM, decreased strength, increased fascial restrictions, increased muscle spasms, impaired sensation, improper body mechanics, and pain.    GOALS: Goals reviewed with patient? Yes  SHORT TERM GOALS: Target date: 09/21/2022   Pt will be ind with initial HEP Baseline: Goal status: IN PROGRESS 1/3: MET   LONG TERM GOALS: Target date: 10/19/2022   Pt will be ind with advancement and progression of HEP Baseline:  Goal status: MET 1/10   2.  Pt will report improved pain by at least 50% with stair ascent/descent Baseline:  Goal status: 1/10: PROGRESSING (L>R descend) Goal status: MET 1/24  3.  Pt will be able to perform 5x STS without UE support to demo improved functional LE strength Baseline:  Goal status: MET 1/10  4.  Pt will demo improved body mechanics with less knee valgus during lifting Baseline:  Goal status: MET 1/10  5.  Pt will have improved FOTO score to >/= 67 Baseline:  Goal status: IN PROGRESS 1/10 -- 52 1/10: PROGRESSING (52) Goal status:     PLAN:  PT FREQUENCY: 2x/week  PT DURATION: 8 weeks  PLANNED INTERVENTIONS: Therapeutic exercises, Therapeutic activity, Neuromuscular re-education, Balance training, Gait training, Patient/Family education, Self Care, Joint mobilization, Stair training, Orthotic/Fit training, Aquatic Therapy, Dry Needling, Electrical stimulation, Cryotherapy, Moist heat, Taping, Vasopneumatic device, Ultrasound, Ionotophoresis '4mg'$ /ml Dexamethasone, Manual therapy, and Re-evaluation  PLAN FOR NEXT SESSION: FOTO; Knee  measurements, hip/knee MMT; Discharge or re-cert? Follow up with patient on ortho appt

## 2022-10-19 ENCOUNTER — Encounter: Payer: BC Managed Care – PPO | Admitting: Physical Therapy

## 2022-10-20 ENCOUNTER — Ambulatory Visit: Payer: BC Managed Care – PPO | Attending: Orthopedic Surgery

## 2022-10-20 DIAGNOSIS — G8929 Other chronic pain: Secondary | ICD-10-CM | POA: Insufficient documentation

## 2022-10-20 DIAGNOSIS — M545 Low back pain, unspecified: Secondary | ICD-10-CM | POA: Insufficient documentation

## 2022-10-20 DIAGNOSIS — R296 Repeated falls: Secondary | ICD-10-CM | POA: Diagnosis not present

## 2022-10-20 DIAGNOSIS — R262 Difficulty in walking, not elsewhere classified: Secondary | ICD-10-CM | POA: Diagnosis not present

## 2022-10-20 DIAGNOSIS — M6281 Muscle weakness (generalized): Secondary | ICD-10-CM | POA: Insufficient documentation

## 2022-10-20 DIAGNOSIS — M25562 Pain in left knee: Secondary | ICD-10-CM | POA: Diagnosis not present

## 2022-10-20 NOTE — Therapy (Addendum)
OUTPATIENT PHYSICAL THERAPY TREATMENT AND RE-CERT   Patient Name: Wanda Gonzales MRN: [id number] DOB:1956/02/09, 67 y.o., female Today's Date: 10/20/2022    END OF SESSION:  PT End of Session - 10/20/22 0850     Visit Number 16    Number of Visits 16    Date for PT Re-Evaluation 10/18/22    Authorization Type BCBS Medicare    Progress Note Due on Visit 20    PT Start Time [phone]    PT Stop Time 0930    PT Time Calculation (min) 44 min    Activity Tolerance Patient tolerated treatment well    Behavior During Therapy [facility] for tasks assessed/performed                Past Medical History:  Diagnosis Date   Abnormal Pap smear, atypical squamous cells of undetermined sign (ASC-US) 1998   treated with cryo, CIN I   ADD (attention deficit disorder) 02-23-12   easily distracted.   Arthritis 02-23-12   Rt. hip and lt. arm   Closed displaced fracture of fifth metatarsal bone of left foot 03/31/2017   Depression with anxiety    History of osteoporosis    Kidney calculi 07/2015   Kidney stone    PONV (postoperative nausea and vomiting) 02-23-12   always has PONV   Post-nasal drainage 02-23-12   frequent a problem,causes a cough and mucus buildup in throat   Urethral polyp 1998   Dr. Hartley Barefoot   Varicose veins    Venous insufficiency    chronic-denies any problems   Past Surgical History:  Procedure Laterality Date   blood clot removed Left 01/2012   blood clot removed from left hand    BREAST BIOPSY Right 02/14/2012   Fibroadenoma   COLONOSCOPY  08/2003   repeat 5 yrs.   COLONOSCOPY W/ BIOPSIES  12/2008   1 polyp recheck 5 yrs   EXCISION/RELEASE BURSA HIP  02/29/2012   HAND SURGERY  02/23/2012   "blood clot" excised palm left hand 01-18-12   KNEE ARTHROSCOPY W/ MENISCAL REPAIR  10/2012   KNEE ARTHROSCOPY WITH EXCISION BAKER'S CYST Left 10/2012   meniscal repair   ORIF METATARSAL FRACTURE Left 04/07/2017   Left 5th Metatarsal   SHOULDER SURGERY Right 02/2015   Dr. 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   Patient Active Problem List   Diagnosis Date Noted   IBS (irritable bowel syndrome with diarrhea) 08/17/2022   Hearing aid worn 02/10/2022   Trochanteric bursitis of left hip 02/06/2022   Frequent falls 11/26/2020   Depression with anxiety    History of osteoporosis    Age-related osteoporosis without current pathological fracture 07/29/2020   Osteoporosis 07/27/2020   Rhinitis 03/18/2020   Pain in left knee 09/07/2018   Seborrheic keratosis 11/23/2017   S/P TKR (total knee replacement), left 08/25/2016   Benign paroxysmal positional vertigo 08/25/2016   Mid back pain, chronic 12/16/2015   Cervical strain 11/21/2014   DDD (degenerative disc disease), lumbar 02/17/2014   Vitamin D deficiency 03/18/2013   Trochanteric bursitis of right hip 02/29/2012   INSOMNIA 04/13/2010   Hyperlipidemia 09/17/2007   CERVICAL POLYP 09/17/2007   Attention deficit disorder 09/11/2007   UNSPECIFIED VENOUS INSUFFICIENCY 08/29/2007    PCP: Beatrice Lecher  REFERRING PROVIDER: Gaynelle Arabian  REFERRING DIAG: Z47.1: Aftercare following joint replacement surgery; [phone] presence of left artificial knee joint  THERAPY DIAG:  Chronic pain of left knee  Muscle weakness (generalized)  Difficulty in walking, not elsewhere classified  Repeated falls  Rationale for Evaluation and Treatment: Rehabilitation  ONSET DATE: ~4 years ago  SUBJECTIVE:   SUBJECTIVE STATEMENT: Patient reports mild soreness in bilateral thighs from virtual workout yesterday; reports no knee pain today. Patient states she has follow up with ortho in one month to check up on tender spots at below knee. 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  PERTINENT HISTORY: L TKA 4 years ago, L hip surgery, hx of L baker's cyst  (removed) From eval: Pt reports L knee pain. Pt had L TKA 4 years ago. Pt had scar tissue removed May 24, 2022 but still getting pain. Pt has been going to pool and doing exercises. Pt has been walking. Has been using voltaren on her knee.   PAIN:  Are you having pain? Yes: NPRS scale: 0/10 Pain location: Lt knee at joint line (medial/lateral) Pain description: Can be sharp, achy and dull Aggravating factors: Sit to stand, stairs, and getting out of bed; fatigue Relieving factors: Water therapy  PRECAUTIONS: None  WEIGHT BEARING RESTRICTIONS: No  FALLS:  Has patient fallen in last 6 months? Yes. Number of falls 5 or 6 times (normally to the left), last fall in October  OCCUPATION: Teaches art 2 days/wk  PATIENT GOALS: Improve knee pain with mobility  NEXT MD VISIT: n/a  OBJECTIVE:  From eval unless otherwise noted  PALPATION: TTP L anterior gastroc, patellar tendon, medial and lateral knee joint line  LOWER EXTREMITY ROM:  Active ROM Right eval Left eval Right 1/10 Left  1/10  Knee flexion 120 115 130 122  Knee extension 0 0     (Blank rows = not tested)  LOWER EXTREMITY MMT:  MMT Right eval Left eval Right 1/10 Left 1/10  Hip flexion 5 3+ 4 4-  Hip extension 3 3 3+ 4-  Hip abduction 4- 3 4+ 4-  Hip adduction 5 4-  4+  Hip internal rotation      Hip external rotation      Knee flexion 5 5    Knee extension 5 5    Ankle dorsiflexion 5 5    Ankle plantarflexion 5 5    Ankle inversion 5 4    Ankle eversion 5 5     (Blank rows = not tested)  LOWER EXTREMITY SPECIAL TESTS:  Knee special tests: Patellafemoral apprehension test: positive   FUNCTIONAL TESTS:  SLS on R 4 sec, on L attempts but unable to hold R LE back tandem 30 sec, L LE back tandem 30 sec Painful sit to stand and squatting with increased L knee valgus noted Unable to perform stairs with reciprocal pattern -- L knee valgus noted with ascent and descent  GAIT: Distance walked:  150 Assistive device utilized: None Level of assistance: Complete Independence Comments: antalgic, decreased stance on L, trendelenburg pattern   [facility] Adult PT Treatment:                                                DATE: 10/20/2022 Therapeutic Exercise: NuStep L6 x6 min + subjective intake STS x5  Supine ITB stretch w/strap 2x30" (L) S/L clamshells YTB 2x15 (L) Bent knee fall out (focus  on L stability) x20  Bridge + hip abd YTB x10, RTB x10 Leg press: DL 70# x15 Seated edge of table: hip flexor straight leg raises  Therapeutic Activity: Ascend/descend stairs x 4 rounds (lateral L knee pain) FOTO    [facility] Adult PT Treatment:                                                DATE: 10/14/22 Pt seen for aquatic therapy today.  Treatment took place in water 3.25-4.5 ft in depth at the [place]. Temp of water was 91.  Pt entered/exited the pool via stairs with step-to with bilat rail.  * warm up forward/ backward walking and side stepping - multiple laps * L forward step ups x 10, eccentric lowering * SLS with solid noodle press/stomp with intermittent UE on wall x 10 each LE, then hip abdct/knee flexion x 10 with LLE (challenge) * holding noodle:  curtsy lunges (painful in Lt knee after a few reps-stopped); forward walking lunges - cues to correct valgus in Lt knee with flexion  * Holding noodle:  1/2 diamonds (single leg clams) ; heel raises x 10 * Lt quad stretch with ankle supported by solid noodle * foot on 2nd step -Lt  hamstring stretch x 45 sec   Pt requires the buoyancy and hydrostatic pressure of water for support, and to offload joints by unweighting joint load by at least 50 % in navel deep water and by at least 75-80% in chest to neck deep water.  Viscosity of the water is needed for resistance of strengthening. Water current perturbations provides challenge to standing balance requiring increased core activation.   [facility] Adult PT Treatment:                                                 DATE: 10/12/2022 Therapeutic Exercise: Recumbent bike warm-up L3 x 21mn Ascend/descend stairs (mild pain lower lateral knee) L SLR small ROM parallel x8, ER x6 S/L clamshells YTB 2x12 B S/L bent knee hip abd YTB 2x10 B Bridges with RTB above knees x20 Bent knee fall out (focus on L stability) 2x10 Lunge stretch on stairs --> standing crossed leg ITB stretch STS hands on thighs --> cradling 10#KB --> added overhead press 10#KB in standing Bent knee side stepping GTB above knees 4L at counter Self-myofascial massage L quad (prone, small ball)  PATIENT EDUCATION:  Education details: Mindful of knee stability mechanics (no rotation) Person educated: Patient Education method: Explanation, Demonstration, and Handouts Education comprehension: verbalized understanding, returned demonstration, and needs further education  HOME EXERCISE PROGRAM: Access Code: [email or web address] URL: [email or web address] Date: 09/14/2022 Prepared by: GEstill BambergApril MThurnell Garbe Exercises - Hip Abduction with Resistance Loop  - 1 x daily - 7 x weekly - 2 sets - 10 reps - Hip Extension with Resistance Loop  - 1 x daily - 7 x weekly - 2 sets - 10 reps - Seated Piriformis Stretch  - 1 x daily - 7 x weekly - 3 sets - 3 reps - 30 sec hold - Sit to Stand with Armchair  - 1 x daily - 7 x weekly - 3 sets - 10 reps - Tandem Stance with Chair Support  -  1 x daily - 7 x weekly - 1 sets - 3 reps - 30 sec hold - Forward Step Down Touch with Heel  - 1 x daily - 7 x weekly - 2 sets - 10 reps   ASSESSMENT:  CLINICAL IMPRESSION: Mild knee pain at lower lateral knee increased while completing four rounds of stair ascend/descend navigation. Patient demonstrated initial mild knee valgus during sit to stand, however alignment and stability improved with repetition. Tactile cues provided to improve proprioceptive awareness of L hip stability during unilateral supine clamshells. Moderate L hip flexor  weakness exhibited during seated straight leg raises. Pt is slowly meeting her goals; however, continues to have joint line pain with fatigue. Pt would benefit from continued PT to address these deficits. Goal date updated accordingly.  OBJECTIVE IMPAIRMENTS: Abnormal gait, decreased activity tolerance, decreased balance, decreased endurance, decreased mobility, difficulty walking, decreased ROM, decreased strength, increased fascial restrictions, increased muscle spasms, impaired sensation, improper body mechanics, and pain.    GOALS: Goals reviewed with patient? Yes  SHORT TERM GOALS: Target date: 09/21/2022   Pt will be ind with initial HEP Baseline: Goal status: IN PROGRESS 1/3: MET   LONG TERM GOALS: Target date: 12/05/2022   Pt will be ind with advancement and progression of HEP Baseline:  Goal status: MET 1/10   2.  Pt will report improved pain by at least 50% with stair ascent/descent Baseline:  Goal status: 1/10: PROGRESSING (L>R descend Goal status: MOSTLY MET (mild pain on last round of stairs)  3.  Pt will be able to perform 5x STS without UE support to demo improved functional LE strength Baseline:  Goal status: IN PROGRESS 10/20/22 mild pain on last few reps   4.  Pt will demo improved body mechanics with less knee valgus during lifting Baseline:  Goal status: MET 1/10  5.  Pt will have improved FOTO score to >/= 67 Baseline:  Goal status: IN PROGRESS 1/10 -- 52 1/10: PROGRESSING (52) Goal status: PROGRESSING (55) 10/20/22    PLAN:  PT FREQUENCY: 2x/week  PT DURATION: 8 weeks  PLANNED INTERVENTIONS: Therapeutic exercises, Therapeutic activity, Neuromuscular re-education, Balance training, Gait training, Patient/Family education, Self Care, Joint mobilization, Stair training, Orthotic/Fit training, Aquatic Therapy, Dry Needling, Electrical stimulation, Cryotherapy, Moist heat, Taping, Vasopneumatic device, Ultrasound, Ionotophoresis '4mg'$ /ml Dexamethasone,  Manual therapy, and Re-evaluation  PLAN FOR NEXT SESSION: Continue hip, quad, glute, core strengthening

## 2022-10-21 ENCOUNTER — Ambulatory Visit
Admission: RE | Admit: 2022-10-21 | Discharge: 2022-10-21 | Disposition: A | Discharge status: Home / Self Care | Payer: Medicare Other | Source: Ambulatory Visit | Attending: Family Medicine | Admitting: Family Medicine

## 2022-10-21 DIAGNOSIS — R928 Other abnormal and inconclusive findings on diagnostic imaging of breast: Secondary | ICD-10-CM

## 2022-10-24 NOTE — Addendum Note (Signed)
Addended by: Malachy Moan L on: 10/24/2022 09:07 AM   Modules accepted: Orders

## 2022-10-26 ENCOUNTER — Ambulatory Visit: Payer: BC Managed Care – PPO

## 2022-10-26 DIAGNOSIS — R262 Difficulty in walking, not elsewhere classified: Secondary | ICD-10-CM

## 2022-10-26 DIAGNOSIS — M545 Low back pain, unspecified: Secondary | ICD-10-CM | POA: Diagnosis not present

## 2022-10-26 DIAGNOSIS — G8929 Other chronic pain: Secondary | ICD-10-CM

## 2022-10-26 DIAGNOSIS — M6281 Muscle weakness (generalized): Secondary | ICD-10-CM

## 2022-10-26 DIAGNOSIS — M25562 Pain in left knee: Secondary | ICD-10-CM | POA: Diagnosis not present

## 2022-10-26 DIAGNOSIS — R296 Repeated falls: Secondary | ICD-10-CM | POA: Diagnosis not present

## 2022-10-26 NOTE — Therapy (Addendum)
OUTPATIENT PHYSICAL THERAPY TREATMENT  Patient Name: Wanda Gonzales MRN: [id number] DOB:12-03-1955, 67 y.o., female Today's Date: 10/26/2022    END OF SESSION:  PT End of Session - 10/26/22 1313     Visit Number 17    Date for PT Re-Evaluation 12/05/22    Authorization Type BCBS Medicare    Progress Note Due on Visit 20    PT Start Time 1315    PT Stop Time 1358    PT Time Calculation (min) 43 min    Activity Tolerance Patient tolerated treatment well    Behavior During Therapy WFL for tasks assessed/performed                Past Medical History:  Diagnosis Date   Abnormal Pap smear, atypical squamous cells of undetermined sign (ASC-US) 1998   treated with cryo, CIN I   ADD (attention deficit disorder) 02-23-12   easily distracted.   Arthritis 02-23-12   Rt. hip and lt. arm   Closed displaced fracture of fifth metatarsal bone of left foot 03/31/2017   Depression with anxiety    History of osteoporosis    Kidney calculi 07/2015   Kidney stone    PONV (postoperative nausea and vomiting) 02-23-12   always has PONV   Post-nasal drainage 02-23-12   frequent a problem,causes a cough and mucus buildup in throat   Urethral polyp 1998   Dr. Hartley Barefoot   Varicose veins    Venous insufficiency    chronic-denies any problems   Past Surgical History:  Procedure Laterality Date   blood clot removed Left 01/2012   blood clot removed from left hand    BREAST BIOPSY Right 02/14/2012   Fibroadenoma   COLONOSCOPY  08/2003   repeat 5 yrs.   COLONOSCOPY W/ BIOPSIES  12/2008   1 polyp recheck 5 yrs   EXCISION/RELEASE BURSA HIP  02/29/2012   HAND SURGERY  02/23/2012   "blood clot" excised palm left hand 01-18-12   KNEE ARTHROSCOPY W/ MENISCAL REPAIR  10/2012   KNEE ARTHROSCOPY WITH EXCISION BAKER'S CYST Left 10/2012   meniscal repair   ORIF METATARSAL FRACTURE Left 04/07/2017   Left 5th Metatarsal   SHOULDER SURGERY Right 02/2015   Dr. 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   Patient Active Problem List   Diagnosis Date Noted   IBS (irritable bowel syndrome with diarrhea) 08/17/2022   Hearing aid worn 02/10/2022   Trochanteric bursitis of left hip 02/06/2022   Frequent falls 11/26/2020   Depression with anxiety    History of osteoporosis    Age-related osteoporosis without current pathological fracture 07/29/2020   Osteoporosis 07/27/2020   Rhinitis 03/18/2020   Pain in left knee 09/07/2018   Seborrheic keratosis 11/23/2017   S/P TKR (total knee replacement), left 08/25/2016   Benign paroxysmal positional vertigo 08/25/2016   Mid back pain, chronic 12/16/2015   Cervical strain 11/21/2014   DDD (degenerative disc disease), lumbar 02/17/2014   Vitamin D deficiency 03/18/2013   Trochanteric bursitis of right hip 02/29/2012   INSOMNIA 04/13/2010   Hyperlipidemia 09/17/2007   CERVICAL POLYP 09/17/2007   Attention deficit disorder 09/11/2007   UNSPECIFIED VENOUS INSUFFICIENCY 08/29/2007    PCP: Beatrice Lecher  REFERRING PROVIDER: Lemont Fillers DIAG: Z47.1: Aftercare following joint replacement surgery; [phone] presence of left artificial knee joint  THERAPY DIAG:  Chronic pain of left knee  Muscle weakness (generalized)  Difficulty in walking, not elsewhere classified  Repeated falls  Chronic bilateral low back pain without sciatica  Rationale for Evaluation and Treatment: Rehabilitation  ONSET DATE: ~4 years ago  SUBJECTIVE:   SUBJECTIVE STATEMENT: Patient reports she is tired today due to having a busy few days work. Patient states she woke up with medial knee pain today that decreased after she was up and walking around for a while.   PERTINENT HISTORY: L TKA 4 years ago, L hip surgery, hx of L baker's cyst (removed) From eval: Pt reports L knee pain. Pt had L TKA 4 years ago. Pt had scar tissue removed May 24, 2022 but still  getting pain. Pt has been going to pool and doing exercises. Pt has been walking. Has been using voltaren on her knee.   PAIN:  Are you having pain? Yes: NPRS scale: 0/10 Pain location: Lt knee at joint line (medial/lateral) Pain description: Can be sharp, achy and dull Aggravating factors: Sit to stand, stairs, and getting out of bed; fatigue Relieving factors: Water therapy  PRECAUTIONS: None  WEIGHT BEARING RESTRICTIONS: No  FALLS:  Has patient fallen in last 6 months? Yes. Number of falls 5 or 6 times (normally to the left), last fall in October  OCCUPATION: Teaches art 2 days/wk  PATIENT GOALS: Improve knee pain with mobility  NEXT MD VISIT: n/a  OBJECTIVE:  From eval unless otherwise noted  PALPATION: TTP L anterior gastroc, patellar tendon, medial and lateral knee joint line  LOWER EXTREMITY ROM:  Active ROM Right eval Left eval Right 1/10 Left  1/10  Knee flexion 120 115 130 122  Knee extension 0 0     (Blank rows = not tested)  LOWER EXTREMITY MMT:  MMT Right eval Left eval Right 1/10 Left 1/10  Hip flexion 5 3+ 4 4-  Hip extension 3 3 3+ 4-  Hip abduction 4- 3 4+ 4-  Hip adduction 5 4-  4+  Hip internal rotation      Hip external rotation      Knee flexion 5 5    Knee extension 5 5    Ankle dorsiflexion 5 5    Ankle plantarflexion 5 5    Ankle inversion 5 4    Ankle eversion 5 5     (Blank rows = not tested)  LOWER EXTREMITY SPECIAL TESTS:  Knee special tests: Patellafemoral apprehension test: positive   FUNCTIONAL TESTS:  SLS on R 4 sec, on L attempts but unable to hold R LE back tandem 30 sec, L LE back tandem 30 sec Painful sit to stand and squatting with increased L knee valgus noted Unable to perform stairs with reciprocal pattern -- L knee valgus noted with ascent and descent  GAIT: Distance walked: 150 Assistive device utilized: None Level of assistance: Complete Independence Comments: antalgic, decreased stance on L,  trendelenburg pattern   [facility] Adult PT Treatment:                                                DATE: 10/26/2022 Therapeutic Exercise: NuStep L6 x 27mn Bent knee fall out GTB x20 S/L clamshell GTB 2x10 B Pilates side leg series Bridges w/GTB above knees x10 --> added hip abd x10 Seated edge of table: hip flexor straight leg raises over tall yoga block progression Runners lunge stretch (L) on top 6"  step 4" heel taps lateral and front x10 B  Leg Press: DL 70# 2x10    [facility] Adult PT Treatment:                                                DATE: 10/20/2022 Therapeutic Exercise: NuStep L6 x6 min + subjective intake STS x5  Supine ITB stretch w/strap 2x30" (L) S/L clamshells YTB 2x15 (L) Bent knee fall out (focus on L stability) x20  Bridge + hip abd YTB x10, RTB x10 Leg press: DL 70# x15 Seated edge of table: hip flexor straight leg raises  Therapeutic Activity: Ascend/descend stairs x 4 rounds (lateral L knee pain) FOTO    [facility] Adult PT Treatment:                                                DATE: 10/14/22 Pt seen for aquatic therapy today.  Treatment took place in water 3.25-4.5 ft in depth at the [place]. Temp of water was 91.  Pt entered/exited the pool via stairs with step-to with bilat rail.  * warm up forward/ backward walking and side stepping - multiple laps * L forward step ups x 10, eccentric lowering * SLS with solid noodle press/stomp with intermittent UE on wall x 10 each LE, then hip abdct/knee flexion x 10 with LLE (challenge) * holding noodle:  curtsy lunges (painful in Lt knee after a few reps-stopped); forward walking lunges - cues to correct valgus in Lt knee with flexion  * Holding noodle:  1/2 diamonds (single leg clams) ; heel raises x 10 * Lt quad stretch with ankle supported by solid noodle * foot on 2nd step -Lt  hamstring stretch x 45 sec   Pt requires the buoyancy and hydrostatic pressure of water for support, and to offload joints by  unweighting joint load by at least 50 % in navel deep water and by at least 75-80% in chest to neck deep water.  Viscosity of the water is needed for resistance of strengthening. Water current perturbations provides challenge to standing balance requiring increased core activation.   PATIENT EDUCATION:  Education details: Mindful of knee stability mechanics (no rotation) Person educated: Patient Education method: Explanation, Demonstration, and Handouts Education comprehension: verbalized understanding, returned demonstration, and needs further education  HOME EXERCISE PROGRAM: Access Code: [email or web address] URL: [email or web address] Date: 09/14/2022 Prepared by: Estill Bamberg April Thurnell Garbe  Exercises - Hip Abduction with Resistance Loop  - 1 x daily - 7 x weekly - 2 sets - 10 reps - Hip Extension with Resistance Loop  - 1 x daily - 7 x weekly - 2 sets - 10 reps - Seated Piriformis Stretch  - 1 x daily - 7 x weekly - 3 sets - 3 reps - 30 sec hold - Sit to Stand with Armchair  - 1 x daily - 7 x weekly - 3 sets - 10 reps - Tandem Stance with Chair Support  - 1 x daily - 7 x weekly - 1 sets - 3 reps - 30 sec hold - Forward Step Down Touch with Heel  - 1 x daily - 7 x weekly - 2 sets - 10 reps   ASSESSMENT:  CLINICAL IMPRESSION:  Patient continues to demonstrate increased anterior weight shifting and overloading L knee during heel taps on low step; tactile cues moderately improved alignment however patient continues to have increased pain at medial knee. Single leg press discontinued due to pain at medial/lateral inferior L knee.   OBJECTIVE IMPAIRMENTS: Abnormal gait, decreased activity tolerance, decreased balance, decreased endurance, decreased mobility, difficulty walking, decreased ROM, decreased strength, increased fascial restrictions, increased muscle spasms, impaired sensation, improper body mechanics, and pain.    GOALS: Goals reviewed with patient? Yes  SHORT TERM  GOALS: Target date: 09/21/2022  Pt will be ind with initial HEP Baseline: Goal status: IN PROGRESS 1/3: MET   LONG TERM GOALS: Target date: 12/05/2022  Pt will be ind with advancement and progression of HEP Baseline:  Goal status: MET 1/10   2.  Pt will report improved pain by at least 50% with stair ascent/descent Baseline:  Goal status: 1/10: PROGRESSING (L>R descend Goal status: MOSTLY MET (mild pain on last round of stairs)  3.  Pt will be able to perform 5x STS without UE support to demo improved functional LE strength Baseline:  Goal status: IN PROGRESS 10/20/22 mild pain on last few reps   4.  Pt will demo improved body mechanics with less knee valgus during lifting Baseline:  Goal status: MET 1/10  5.  Pt will have improved FOTO score to >/= 67 Baseline:  Goal status: IN PROGRESS 1/10 -- 52 1/10: PROGRESSING (52) Goal status: PROGRESSING (55) 10/20/22    PLAN:  PT FREQUENCY: 2x/week  PT DURATION: 8 weeks  PLANNED INTERVENTIONS: Therapeutic exercises, Therapeutic activity, Neuromuscular re-education, Balance training, Gait training, Patient/Family education, Self Care, Joint mobilization, Stair training, Orthotic/Fit training, Aquatic Therapy, Dry Needling, Electrical stimulation, Cryotherapy, Moist heat, Taping, Vasopneumatic device, Ultrasound, Ionotophoresis '4mg'$ /ml Dexamethasone, Manual therapy, and Re-evaluation  PLAN FOR NEXT SESSION: Continue hip, quad, glute, core strengthening (Ortho appt on 3/8)   Helane Gunther, PTA 10/26/2022

## 2022-10-28 ENCOUNTER — Ambulatory Visit: Payer: BC Managed Care – PPO

## 2022-10-28 DIAGNOSIS — R262 Difficulty in walking, not elsewhere classified: Secondary | ICD-10-CM | POA: Diagnosis not present

## 2022-10-28 DIAGNOSIS — G8929 Other chronic pain: Secondary | ICD-10-CM

## 2022-10-28 DIAGNOSIS — M6281 Muscle weakness (generalized): Secondary | ICD-10-CM | POA: Diagnosis not present

## 2022-10-28 DIAGNOSIS — M25562 Pain in left knee: Secondary | ICD-10-CM | POA: Diagnosis not present

## 2022-10-28 DIAGNOSIS — M545 Low back pain, unspecified: Secondary | ICD-10-CM | POA: Diagnosis not present

## 2022-10-28 DIAGNOSIS — R296 Repeated falls: Secondary | ICD-10-CM | POA: Diagnosis not present

## 2022-10-28 NOTE — Therapy (Signed)
OUTPATIENT PHYSICAL THERAPY TREATMENT  Patient Name: Wanda Gonzales MRN: [id number] DOB:1956-08-19, 67 y.o., female Today's Date: 10/28/2022    END OF SESSION:  PT End of Session - 10/28/22 1103     Visit Number 18    Date for PT Re-Evaluation 12/05/22    Authorization Type BCBS Medicare    PT Start Time 1102    PT Stop Time 1150    PT Time Calculation (min) 48 min    Activity Tolerance Patient tolerated treatment well    Behavior During Therapy WFL for tasks assessed/performed                Past Medical History:  Diagnosis Date   Abnormal Pap smear, atypical squamous cells of undetermined sign (ASC-US) 1998   treated with cryo, CIN I   ADD (attention deficit disorder) 02-23-12   easily distracted.   Arthritis 02-23-12   Rt. hip and lt. arm   Closed displaced fracture of fifth metatarsal bone of left foot 03/31/2017   Depression with anxiety    History of osteoporosis    Kidney calculi 07/2015   Kidney stone    PONV (postoperative nausea and vomiting) 02-23-12   always has PONV   Post-nasal drainage 02-23-12   frequent a problem,causes a cough and mucus buildup in throat   Urethral polyp 1998   Dr. Hartley Barefoot   Varicose veins    Venous insufficiency    chronic-denies any problems   Past Surgical History:  Procedure Laterality Date   blood clot removed Left 01/2012   blood clot removed from left hand    BREAST BIOPSY Right 02/14/2012   Fibroadenoma   COLONOSCOPY  08/2003   repeat 5 yrs.   COLONOSCOPY W/ BIOPSIES  12/2008   1 polyp recheck 5 yrs   EXCISION/RELEASE BURSA HIP  02/29/2012   HAND SURGERY  02/23/2012   "blood clot" excised palm left hand 01-18-12   KNEE ARTHROSCOPY W/ MENISCAL REPAIR  10/2012   KNEE ARTHROSCOPY WITH EXCISION BAKER'S CYST Left 10/2012   meniscal repair   ORIF METATARSAL FRACTURE Left 04/07/2017   Left 5th Metatarsal   SHOULDER SURGERY Right 02/2015   Dr. 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   Patient Active Problem List   Diagnosis Date Noted   IBS (irritable bowel syndrome with diarrhea) 08/17/2022   Hearing aid worn 02/10/2022   Trochanteric bursitis of left hip 02/06/2022   Frequent falls 11/26/2020   Depression with anxiety    History of osteoporosis    Age-related osteoporosis without current pathological fracture 07/29/2020   Osteoporosis 07/27/2020   Rhinitis 03/18/2020   Pain in left knee 09/07/2018   Seborrheic keratosis 11/23/2017   S/P TKR (total knee replacement), left 08/25/2016   Benign paroxysmal positional vertigo 08/25/2016   Mid back pain, chronic 12/16/2015   Cervical strain 11/21/2014   DDD (degenerative disc disease), lumbar 02/17/2014   Vitamin D deficiency 03/18/2013   Trochanteric bursitis of right hip 02/29/2012   INSOMNIA 04/13/2010   Hyperlipidemia 09/17/2007   CERVICAL POLYP 09/17/2007   Attention deficit disorder 09/11/2007   UNSPECIFIED VENOUS INSUFFICIENCY 08/29/2007    PCP: Beatrice Lecher  REFERRING PROVIDER: Lemont Fillers DIAG: Z47.1: Aftercare following joint replacement surgery; [phone] presence of left artificial knee joint  THERAPY DIAG:  Chronic pain of left knee  Muscle weakness (generalized)  Difficulty in walking, not elsewhere classified  Repeated  falls  Chronic bilateral low back pain without sciatica  Rationale for Evaluation and Treatment: Rehabilitation  ONSET DATE: ~4 years ago  SUBJECTIVE:   SUBJECTIVE STATEMENT: Patient reports she is tired today due to having a busy few days work. Patient states she continues to wake up with 8/10 medial knee pain that decreases to 2/10 pain after she is up and walking around for a while.   PERTINENT HISTORY: L TKA 4 years ago, L hip surgery, hx of L baker's cyst (removed) From eval: Pt reports L knee pain. Pt had L TKA 4 years ago. Pt had scar tissue removed May 24, 2022 but still getting pain. Pt  has been going to pool and doing exercises. Pt has been walking. Has been using voltaren on her knee.   PAIN:  Are you having pain? Yes: NPRS scale: 2/10 Pain location: Lt knee at joint line (medial/lateral) Pain description: Can be sharp, achy and dull Aggravating factors: Sit to stand, stairs, and getting out of bed; fatigue Relieving factors: Water therapy  PRECAUTIONS: None  WEIGHT BEARING RESTRICTIONS: No  FALLS:  Has patient fallen in last 6 months? Yes. Number of falls 5 or 6 times (normally to the left), last fall in October  OCCUPATION: Teaches art 2 days/wk  PATIENT GOALS: Improve knee pain with mobility  NEXT MD VISIT: n/a  OBJECTIVE:  From eval unless otherwise noted  PALPATION: TTP L anterior gastroc, patellar tendon, medial and lateral knee joint line  LOWER EXTREMITY ROM:  Active ROM Right eval Left eval Right 1/10 Left  1/10  Knee flexion 120 115 130 122  Knee extension 0 0     (Blank rows = not tested)  LOWER EXTREMITY MMT:  MMT Right eval Left eval Right 1/10 Left 1/10  Hip flexion 5 3+ 4 4-  Hip extension 3 3 3+ 4-  Hip abduction 4- 3 4+ 4-  Hip adduction 5 4-  4+  Hip internal rotation      Hip external rotation      Knee flexion 5 5    Knee extension 5 5    Ankle dorsiflexion 5 5    Ankle plantarflexion 5 5    Ankle inversion 5 4    Ankle eversion 5 5     (Blank rows = not tested)  LOWER EXTREMITY SPECIAL TESTS:  Knee special tests: Patellafemoral apprehension test: positive   FUNCTIONAL TESTS:  SLS on R 4 sec, on L attempts but unable to hold R LE back tandem 30 sec, L LE back tandem 30 sec Painful sit to stand and squatting with increased L knee valgus noted Unable to perform stairs with reciprocal pattern -- L knee valgus noted with ascent and descent  GAIT: Distance walked: 150 Assistive device utilized: None Level of assistance: Complete Independence Comments: antalgic, decreased stance on L, trendelenburg  pattern   [facility] Adult PT Treatment:                                                DATE: 10/28/2022 Therapeutic Exercise: S/L clamshells x20 S/L straight leg hip abd + IR 2x10 Bridges + clamshell at top RTB 3x10 AROM heel slides (L) Small ROM SLR: parallel, ER (discontinued d/t pain)  Manual Therapy: STM anterior tib, medial/lateral knee complex Modalities: Game ready L knee, 32 deg, med compression x10 min  [facility] Adult PT Treatment:                                                DATE: 10/26/2022 Therapeutic Exercise: NuStep L6 x 14mn Bent knee fall out GTB x20 S/L clamshell GTB 2x10 B Pilates side leg series Bridges w/GTB above knees x10 --> added hip abd x10 Seated edge of table: hip flexor straight leg raises over tall yoga block progression Runners lunge stretch (L) on top 6" step 4" heel taps lateral and front x10 B  Leg Press: DL 70# 2x10    [facility] Adult PT Treatment:                                                DATE: 10/14/22 Pt seen for aquatic therapy today.  Treatment took place in water 3.25-4.[place] [facility] Temp of water was 91.  Pt entered/exited the pool via stairs with step-to with bilat rail.  * warm up forward/ backward walking and side stepping - multiple laps * L forward step ups x 10, eccentric lowering * SLS with solid noodle press/stomp with intermittent UE on wall x 10 each LE, then hip abdct/knee flexion x 10 with LLE (challenge) * holding noodle:  curtsy lunges (painful in Lt knee after a few reps-stopped); forward walking lunges - cues to correct valgus in Lt knee with flexion  * Holding noodle:  1/2 diamonds (single leg clams) ; heel raises x 10 * Lt quad stretch with ankle supported by solid noodle * foot on 2nd step -Lt  hamstring stretch x 45 sec   Pt requires the buoyancy and hydrostatic pressure of water for support, and to offload joints by unweighting joint load by at least 50 % in navel deep water and by at  least 75-80% in chest to neck deep water.  Viscosity of the water is needed for resistance of strengthening. Water current perturbations provides challenge to standing balance requiring increased core activation.   PATIENT EDUCATION:  Education details: Placing support underneath knees in sitting and placed between knees when sleeping. Person educated: Patient Education method: Explanation, Demonstration, and Handouts Education comprehension: verbalized understanding, returned demonstration, and needs further education  HOME EXERCISE PROGRAM: Access Code: [email or web address] URL: [email or web address] Date: 09/14/2022 Prepared by: GEstill BambergApril MThurnell Garbe Exercises - Hip Abduction with Resistance Loop  - 1 x daily - 7 x weekly - 2 sets - 10 reps - Hip Extension with Resistance Loop  - 1 x daily - 7 x weekly - 2 sets - 10 reps - Seated Piriformis Stretch  - 1 x daily - 7 x weekly - 3 sets - 3 reps - 30 sec hold - Sit to Stand with Armchair  - 1 x daily - 7 x weekly - 3 sets - 10 reps - Tandem Stance with Chair Support  - 1 x daily - 7 x weekly - 1 sets - 3 reps - 30 sec hold - Forward Step Down Touch with Heel  - 1 x daily - 7 x weekly - 2 sets - 10 reps   ASSESSMENT:  CLINICAL IMPRESSION:  Significant tenderness with palpitation along L anterior tibialis (proximal > distal); swelling noted along  L medial lower knee as compared to R. Increased pain reported with AROM knee flexion during supine heel slides. Gradual increase in medial knee pain during straight leg raises, most significantly with added external rotation.   OBJECTIVE IMPAIRMENTS: Abnormal gait, decreased activity tolerance, decreased balance, decreased endurance, decreased mobility, difficulty walking, decreased ROM, decreased strength, increased fascial restrictions, increased muscle spasms, impaired sensation, improper body mechanics, and pain.    GOALS: Goals reviewed with patient? Yes  SHORT TERM GOALS: Target  date: 09/21/2022  Pt will be ind with initial HEP Baseline: Goal status: IN PROGRESS 1/3: MET   LONG TERM GOALS: Target date: 12/05/2022  Pt will be ind with advancement and progression of HEP Baseline:  Goal status: MET 1/10   2.  Pt will report improved pain by at least 50% with stair ascent/descent Baseline:  Goal status: 1/10: PROGRESSING (L>R descend Goal status: MOSTLY MET (mild pain on last round of stairs)  3.  Pt will be able to perform 5x STS without UE support to demo improved functional LE strength Baseline:  Goal status: IN PROGRESS 10/20/22 mild pain on last few reps   4.  Pt will demo improved body mechanics with less knee valgus during lifting Baseline:  Goal status: MET 1/10  5.  Pt will have improved FOTO score to >/= 67 Baseline:  Goal status: IN PROGRESS 1/10 -- 52 1/10: PROGRESSING (52) Goal status: PROGRESSING (55) 10/20/22    PLAN:  PT FREQUENCY: 2x/week  PT DURATION: 8 weeks  PLANNED INTERVENTIONS: Therapeutic exercises, Therapeutic activity, Neuromuscular re-education, Balance training, Gait training, Patient/Family education, Self Care, Joint mobilization, Stair training, Orthotic/Fit training, Aquatic Therapy, Dry Needling, Electrical stimulation, Cryotherapy, Moist heat, Taping, Vasopneumatic device, Ultrasound, Ionotophoresis 22m/ml Dexamethasone, Manual therapy, and Re-evaluation  PLAN FOR NEXT SESSION: Continue hip, quad, glute, core strengthening (Ortho appt on 3/8)   KHelane Gunther PTA 10/28/2022

## 2022-11-02 ENCOUNTER — Ambulatory Visit: Payer: BC Managed Care – PPO

## 2022-11-02 DIAGNOSIS — G8929 Other chronic pain: Secondary | ICD-10-CM

## 2022-11-02 DIAGNOSIS — R262 Difficulty in walking, not elsewhere classified: Secondary | ICD-10-CM

## 2022-11-02 DIAGNOSIS — M545 Low back pain, unspecified: Secondary | ICD-10-CM

## 2022-11-02 DIAGNOSIS — R296 Repeated falls: Secondary | ICD-10-CM | POA: Diagnosis not present

## 2022-11-02 DIAGNOSIS — M25562 Pain in left knee: Secondary | ICD-10-CM | POA: Diagnosis not present

## 2022-11-02 DIAGNOSIS — M6281 Muscle weakness (generalized): Secondary | ICD-10-CM | POA: Diagnosis not present

## 2022-11-02 NOTE — Therapy (Signed)
OUTPATIENT PHYSICAL THERAPY TREATMENT  Patient Name: Wanda Gonzales MRN: [id number] DOB:Jan 28, 1956, 67 y.o., female Today's Date: 11/02/2022    END OF SESSION:  PT End of Session - 11/02/22 1452     Visit Number 19    Date for PT Re-Evaluation 12/05/22    Authorization Type BCBS Medicare    PT Start Time 1450    PT Stop Time 1545    PT Time Calculation (min) 55 min    Activity Tolerance Patient tolerated treatment well    Behavior During Therapy WFL for tasks assessed/performed                Past Medical History:  Diagnosis Date   Abnormal Pap smear, atypical squamous cells of undetermined sign (ASC-US) 1998   treated with cryo, CIN I   ADD (attention deficit disorder) 02-23-12   easily distracted.   Arthritis 02-23-12   Rt. hip and lt. arm   Closed displaced fracture of fifth metatarsal bone of left foot 03/31/2017   Depression with anxiety    History of osteoporosis    Kidney calculi 07/2015   Kidney stone    PONV (postoperative nausea and vomiting) 02-23-12   always has PONV   Post-nasal drainage 02-23-12   frequent a problem,causes a cough and mucus buildup in throat   Urethral polyp 1998   Dr. Hartley Barefoot   Varicose veins    Venous insufficiency    chronic-denies any problems   Past Surgical History:  Procedure Laterality Date   blood clot removed Left 01/2012   blood clot removed from left hand    BREAST BIOPSY Right 02/14/2012   Fibroadenoma   COLONOSCOPY  08/2003   repeat 5 yrs.   COLONOSCOPY W/ BIOPSIES  12/2008   1 polyp recheck 5 yrs   EXCISION/RELEASE BURSA HIP  02/29/2012   HAND SURGERY  02/23/2012   "blood clot" excised palm left hand 01-18-12   KNEE ARTHROSCOPY W/ MENISCAL REPAIR  10/2012   KNEE ARTHROSCOPY WITH EXCISION BAKER'S CYST Left 10/2012   meniscal repair   ORIF METATARSAL FRACTURE Left 04/07/2017   Left 5th Metatarsal   SHOULDER SURGERY Right 02/2015   Dr. 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   Patient Active Problem List   Diagnosis Date Noted   IBS (irritable bowel syndrome with diarrhea) 08/17/2022   Hearing aid worn 02/10/2022   Trochanteric bursitis of left hip 02/06/2022   Frequent falls 11/26/2020   Depression with anxiety    History of osteoporosis    Age-related osteoporosis without current pathological fracture 07/29/2020   Osteoporosis 07/27/2020   Rhinitis 03/18/2020   Pain in left knee 09/07/2018   Seborrheic keratosis 11/23/2017   S/P TKR (total knee replacement), left 08/25/2016   Benign paroxysmal positional vertigo 08/25/2016   Mid back pain, chronic 12/16/2015   Cervical strain 11/21/2014   DDD (degenerative disc disease), lumbar 02/17/2014   Vitamin D deficiency 03/18/2013   Trochanteric bursitis of right hip 02/29/2012   INSOMNIA 04/13/2010   Hyperlipidemia 09/17/2007   CERVICAL POLYP 09/17/2007   Attention deficit disorder 09/11/2007   UNSPECIFIED VENOUS INSUFFICIENCY 08/29/2007    PCP: Beatrice Lecher  REFERRING PROVIDER: Lemont Fillers DIAG: Z47.1: Aftercare following joint replacement surgery; [phone] presence of left artificial knee joint  THERAPY DIAG:  Chronic pain of left knee  Muscle weakness (generalized)  Difficulty in walking, not elsewhere classified  Repeated  falls  Chronic bilateral low back pain without sciatica  Rationale for Evaluation and Treatment: Rehabilitation  ONSET DATE: ~4 years ago  SUBJECTIVE:   SUBJECTIVE STATEMENT: Patient reports her knee was feeling better over the weekend but then she was cleaning on Monday it's been feeling irritiated since.  PERTINENT HISTORY: L TKA 4 years ago, L hip surgery, hx of L baker's cyst (removed) From eval: Pt reports L knee pain. Pt had L TKA 4 years ago. Pt had scar tissue removed May 24, 2022 but still getting pain. Pt has been going to pool and doing exercises. Pt has been walking. Has been using  voltaren on her knee.   PAIN:  Are you having pain? Yes: NPRS scale: 2/10 Pain location: Lt knee at joint line (medial/lateral) Pain description: Can be sharp, achy and dull Aggravating factors: Sit to stand, stairs, and getting out of bed; fatigue Relieving factors: Water therapy  PRECAUTIONS: None  WEIGHT BEARING RESTRICTIONS: No  FALLS:  Has patient fallen in last 6 months? Yes. Number of falls 5 or 6 times (normally to the left), last fall in October  OCCUPATION: Teaches art 2 days/wk  PATIENT GOALS: Improve knee pain with mobility  NEXT MD VISIT: n/a  OBJECTIVE:  From eval unless otherwise noted  PALPATION: TTP L anterior gastroc, patellar tendon, medial and lateral knee joint line  LOWER EXTREMITY ROM:  Active ROM Right eval Left eval Right 1/10 Left  1/10  Knee flexion 120 115 130 122  Knee extension 0 0     (Blank rows = not tested)  LOWER EXTREMITY MMT:  MMT Right eval Left eval Right 1/10 Left 1/10  Hip flexion 5 3+ 4 4-  Hip extension 3 3 3+ 4-  Hip abduction 4- 3 4+ 4-  Hip adduction 5 4-  4+  Hip internal rotation      Hip external rotation      Knee flexion 5 5    Knee extension 5 5    Ankle dorsiflexion 5 5    Ankle plantarflexion 5 5    Ankle inversion 5 4    Ankle eversion 5 5     (Blank rows = not tested)  LOWER EXTREMITY SPECIAL TESTS:  Knee special tests: Patellafemoral apprehension test: positive   FUNCTIONAL TESTS:  SLS on R 4 sec, on L attempts but unable to hold R LE back tandem 30 sec, L LE back tandem 30 sec Painful sit to stand and squatting with increased L knee valgus noted Unable to perform stairs with reciprocal pattern -- L knee valgus noted with ascent and descent  GAIT: Distance walked: 150 Assistive device utilized: None Level of assistance: Complete Independence Comments: antalgic, decreased stance on L, trendelenburg pattern   [facility] Adult PT Treatment:                                                DATE:  11/02/2022 Therapeutic Exercise: L HS stretch w/strap x30" --> dynamic HS stretch w/tibial IR x6 S/L resisted clamshell (3#DB - 0#) x15 Bent knee fall out RTB x20 B Supine clamshells x10 RTB L adductor stretch w/strap Legs up the wall --> L add stretch HS isometric supine heel digs 10x5" Seated marching 2x20 STS RTB above knees 2x6 Bent knee side stepping RTB above knees  Manual Therapy: Kinesiotaping to offload medial/lateral knee pressure (star  strips, 50% tension) Modalities: Game ready L knee, 34 deg, med compression x10 min      [facility] Adult PT Treatment:                                                DATE: 10/28/2022 Therapeutic Exercise: S/L clamshells x20 S/L straight leg hip abd + IR 2x10 Bridges + clamshell at top RTB 3x10 AROM heel slides (L) Small ROM SLR: parallel, ER (discontinued d/t pain)  Manual Therapy: STM anterior tib, medial/lateral knee complex Modalities: Game ready L knee, 32 deg, med compression x10 min     [facility] Adult PT Treatment:                                                DATE: 10/14/22 Pt seen for aquatic therapy today.  Treatment took place in water 3.25-4.5 ft in depth at the [facility]. Temp of water was 91.  Pt entered/exited the pool via stairs with step-to with bilat rail.  * warm up forward/ backward walking and side stepping - multiple laps * L forward step ups x 10, eccentric lowering * SLS with solid noodle press/stomp with intermittent UE on wall x 10 each LE, then hip abdct/knee flexion x 10 with LLE (challenge) * holding noodle:  curtsy lunges (painful in Lt knee after a few reps-stopped); forward walking lunges - cues to correct valgus in Lt knee with flexion  * Holding noodle:  1/2 diamonds (single leg clams) ; heel raises x 10 * Lt quad stretch with ankle supported by solid noodle * foot on 2nd step -Lt  hamstring stretch x 45 sec   Pt requires the buoyancy and hydrostatic pressure of water for support, and to  offload joints by unweighting joint load by at least 50 % in navel deep water and by at least 75-80% in chest to neck deep water.  Viscosity of the water is needed for resistance of strengthening. Water current perturbations provides challenge to standing balance requiring increased core activation.   PATIENT EDUCATION:  Education details: Placing support underneath knees in sitting and placed between knees when sleeping. Person educated: Patient Education method: Explanation, Demonstration, and Handouts Education comprehension: verbalized understanding, returned demonstration, and needs further education  HOME EXERCISE PROGRAM: Access Code: [email or web address] URL: [email or web address] Date: 09/14/2022 Prepared by: Estill Bamberg April Thurnell Garbe  Exercises - Hip Abduction with Resistance Loop  - 1 x daily - 7 x weekly - 2 sets - 10 reps - Hip Extension with Resistance Loop  - 1 x daily - 7 x weekly - 2 sets - 10 reps - Seated Piriformis Stretch  - 1 x daily - 7 x weekly - 3 sets - 3 reps - 30 sec hold - Sit to Stand with Armchair  - 1 x daily - 7 x weekly - 3 sets - 10 reps - Tandem Stance with Chair Support  - 1 x daily - 7 x weekly - 1 sets - 3 reps - 30 sec hold - Forward Step Down Touch with Heel  - 1 x daily - 7 x weekly - 2 sets - 10 reps   ASSESSMENT:  CLINICAL IMPRESSION:  Focused on hamstring and adductor stretches to decrease myofascial  tension. Patient able to complete hamstring isometric exercise with supine heel digs with exacerbation of knee pain. Attempted to progress clamshell exercise with light resistance, however patient unable to tolerate due to increased anterior knee pain.  OBJECTIVE IMPAIRMENTS: Abnormal gait, decreased activity tolerance, decreased balance, decreased endurance, decreased mobility, difficulty walking, decreased ROM, decreased strength, increased fascial restrictions, increased muscle spasms, impaired sensation, improper body mechanics, and pain.     GOALS: Goals reviewed with patient? Yes  SHORT TERM GOALS: Target date: 09/21/2022  Pt will be ind with initial HEP Baseline: Goal status: IN PROGRESS 1/3: MET   LONG TERM GOALS: Target date: 12/05/2022  Pt will be ind with advancement and progression of HEP Baseline:  Goal status: MET 1/10   2.  Pt will report improved pain by at least 50% with stair ascent/descent Baseline:  Goal status: 1/10: PROGRESSING (L>R descend Goal status: MOSTLY MET (mild pain on last round of stairs)  3.  Pt will be able to perform 5x STS without UE support to demo improved functional LE strength Baseline:  Goal status: IN PROGRESS 10/20/22 mild pain on last few reps   4.  Pt will demo improved body mechanics with less knee valgus during lifting Baseline:  Goal status: MET 1/10  5.  Pt will have improved FOTO score to >/= 67 Baseline:  Goal status: IN PROGRESS 1/10 -- 52 1/10: PROGRESSING (52) Goal status: PROGRESSING (55) 10/20/22    PLAN:  PT FREQUENCY: 2x/week  PT DURATION: 8 weeks  PLANNED INTERVENTIONS: Therapeutic exercises, Therapeutic activity, Neuromuscular re-education, Balance training, Gait training, Patient/Family education, Self Care, Joint mobilization, Stair training, Orthotic/Fit training, Aquatic Therapy, Dry Needling, Electrical stimulation, Cryotherapy, Moist heat, Taping, Vasopneumatic device, Ultrasound, Ionotophoresis 38m/ml Dexamethasone, Manual therapy, and Re-evaluation  PLAN FOR NEXT SESSION: Assess kinesio taping. Continue hip, quad, glute, core strengthening (Ortho appt on 3/8)   KHelane Gunther PTA 11/02/2022

## 2022-11-04 ENCOUNTER — Ambulatory Visit: Payer: BC Managed Care – PPO

## 2022-11-04 DIAGNOSIS — R262 Difficulty in walking, not elsewhere classified: Secondary | ICD-10-CM | POA: Diagnosis not present

## 2022-11-04 DIAGNOSIS — M545 Low back pain, unspecified: Secondary | ICD-10-CM | POA: Diagnosis not present

## 2022-11-04 DIAGNOSIS — M6281 Muscle weakness (generalized): Secondary | ICD-10-CM | POA: Diagnosis not present

## 2022-11-04 DIAGNOSIS — R296 Repeated falls: Secondary | ICD-10-CM | POA: Diagnosis not present

## 2022-11-04 DIAGNOSIS — G8929 Other chronic pain: Secondary | ICD-10-CM

## 2022-11-04 DIAGNOSIS — M25562 Pain in left knee: Secondary | ICD-10-CM | POA: Diagnosis not present

## 2022-11-04 NOTE — Therapy (Signed)
OUTPATIENT PHYSICAL THERAPY PROGRESS NOTE  Patient Name: Wanda Gonzales MRN: [id number] DOB:1956/07/25, 67 y.o., female Today's Date: 11/04/2022   *Therapy Dates: 08/23/2022 - 11/04/2022  END OF SESSION:  PT End of Session - 11/04/22 1012     Visit Number 20    Date for PT Re-Evaluation 12/05/22    Authorization Type BCBS Medicare    Progress Note Due on Visit 20    PT Start Time 0800    PT Stop Time 0840    PT Time Calculation (min) 40 min    Activity Tolerance Patient tolerated treatment well    Behavior During Therapy WFL for tasks assessed/performed                Past Medical History:  Diagnosis Date   Abnormal Pap smear, atypical squamous cells of undetermined sign (ASC-US) 1998   treated with cryo, CIN I   ADD (attention deficit disorder) 02-23-12   easily distracted.   Arthritis 02-23-12   Rt. hip and lt. arm   Closed displaced fracture of fifth metatarsal bone of left foot 03/31/2017   Depression with anxiety    History of osteoporosis    Kidney calculi 07/2015   Kidney stone    PONV (postoperative nausea and vomiting) 02-23-12   always has PONV   Post-nasal drainage 02-23-12   frequent a problem,causes a cough and mucus buildup in throat   Urethral polyp 1998   Dr. Hartley Barefoot   Varicose veins    Venous insufficiency    chronic-denies any problems   Past Surgical History:  Procedure Laterality Date   blood clot removed Left 01/2012   blood clot removed from left hand    BREAST BIOPSY Right 02/14/2012   Fibroadenoma   COLONOSCOPY  08/2003   repeat 5 yrs.   COLONOSCOPY W/ BIOPSIES  12/2008   1 polyp recheck 5 yrs   EXCISION/RELEASE BURSA HIP  02/29/2012   HAND SURGERY  02/23/2012   "blood clot" excised palm left hand 01-18-12   KNEE ARTHROSCOPY W/ MENISCAL REPAIR  10/2012   KNEE ARTHROSCOPY WITH EXCISION BAKER'S CYST Left 10/2012   meniscal repair   ORIF METATARSAL FRACTURE Left 04/07/2017   Left 5th Metatarsal   SHOULDER SURGERY Right 02/2015   Dr. 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   Patient Active Problem List   Diagnosis Date Noted   IBS (irritable bowel syndrome with diarrhea) 08/17/2022   Hearing aid worn 02/10/2022   Trochanteric bursitis of left hip 02/06/2022   Frequent falls 11/26/2020   Depression with anxiety    History of osteoporosis    Age-related osteoporosis without current pathological fracture 07/29/2020   Osteoporosis 07/27/2020   Rhinitis 03/18/2020   Pain in left knee 09/07/2018   Seborrheic keratosis 11/23/2017   S/P TKR (total knee replacement), left 08/25/2016   Benign paroxysmal positional vertigo 08/25/2016   Mid back pain, chronic 12/16/2015   Cervical strain 11/21/2014   DDD (degenerative disc disease), lumbar 02/17/2014   Vitamin D deficiency 03/18/2013   Trochanteric bursitis of right hip 02/29/2012   INSOMNIA 04/13/2010   Hyperlipidemia 09/17/2007   CERVICAL POLYP 09/17/2007   Attention deficit disorder 09/11/2007   UNSPECIFIED VENOUS INSUFFICIENCY 08/29/2007    PCP: Beatrice Lecher  REFERRING PROVIDER: Lemont Fillers DIAG: Z47.1: Aftercare following joint replacement surgery; [phone] presence of left artificial knee joint  THERAPY DIAG:  Chronic pain of  left knee  Muscle weakness (generalized)  Difficulty in walking, not elsewhere classified  Repeated falls  Chronic bilateral low back pain without sciatica  Rationale for Evaluation and Treatment: Rehabilitation  ONSET DATE: ~4 years ago  SUBJECTIVE:   SUBJECTIVE STATEMENT:  Patient reports she went to a basketball game where she walked up stairs and sat on bleachers and her knee is now feeling irritated. Patient reports mild decrease in swelling since last visit.  PERTINENT HISTORY: L TKA 4 years ago, L hip surgery, hx of L baker's cyst (removed) From eval: Pt reports L knee pain. Pt had L TKA 4 years ago. Pt had scar tissue  removed May 24, 2022 but still getting pain. Pt has been going to pool and doing exercises. Pt has been walking. Has been using voltaren on her knee.   PAIN:  Are you having pain? Yes: NPRS scale: 2/10 Pain location: Lt knee at joint line (medial/lateral) Pain description: Can be sharp, achy and dull Aggravating factors: Sit to stand, stairs, and getting out of bed; fatigue Relieving factors: Water therapy  PRECAUTIONS: None  WEIGHT BEARING RESTRICTIONS: No  FALLS:  Has patient fallen in last 6 months? Yes. Number of falls 5 or 6 times (normally to the left), last fall in October  OCCUPATION: Teaches art 2 days/wk  PATIENT GOALS: Improve knee pain with mobility  NEXT MD VISIT: n/a  OBJECTIVE:  From eval unless otherwise noted  PALPATION: TTP L anterior gastroc, patellar tendon, medial and lateral knee joint line  LOWER EXTREMITY ROM:  Active ROM Right eval Left eval Right 1/10 Left  1/10 Right  11/04/22 Left 11/04/22  Knee flexion 120 115 130 122 140 125  Knee extension 0 0       (Blank rows = not tested)  LOWER EXTREMITY MMT:  MMT Right eval Left eval Right 1/10 Left 1/10 Right 11/04/22 Left 11/04/22  Hip flexion 5 3+ 4 4- 4- 4-  Hip extension 3 3 3+ 4- 4- 4-  Hip abduction 4- 3 4+ 4- 4 4  Hip adduction 5 4-  4+    Hip internal rotation        Hip external rotation        Knee flexion 5 5      Knee extension 5 5      Ankle dorsiflexion 5 5      Ankle plantarflexion 5 5      Ankle inversion 5 4      Ankle eversion 5 5       (Blank rows = not tested)  LOWER EXTREMITY SPECIAL TESTS:  Knee special tests: Patellafemoral apprehension test: positive   FUNCTIONAL TESTS:  SLS on R 4 sec, on L attempts but unable to hold R LE back tandem 30 sec, L LE back tandem 30 sec Painful sit to stand and squatting with increased L knee valgus noted Unable to perform stairs with reciprocal pattern -- L knee valgus noted with ascent and descent  GAIT: Distance  walked: 150 Assistive device utilized: None Level of assistance: Complete Independence Comments: antalgic, decreased stance on L, trendelenburg pattern  [facility] Adult PT Treatment:                                                DATE: 11/04/2022 Therapeutic Exercise: NuStep L6 x 50mn Hip MMT Knee AROM measurements S/L  straight leg hip abd (foot propped on 2 yoga blocks) ITB stretch w/strap STS YTB above knees --> from sitting on yoga blocks & focus on L knee stability Hooklying hip abd isometric w/YTB + ankle add isometric with ball  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[employer])   [facility] Adult PT Treatment:                                                DATE: 11/02/2022 Therapeutic Exercise: L HS stretch w/strap x30" --> dynamic HS stretch w/tibial IR x6 S/L resisted clamshell (3#DB - 0#) x15 Bent knee fall out RTB x20 B Supine clamshells x10 RTB L adductor stretch w/strap Legs up the wall --> L add stretch HS isometric supine heel digs 10x5" Seated marching 2x20 STS RTB above knees 2x6 Bent knee side stepping RTB above knees  Manual Therapy: Kinesiotaping to offload medial/lateral knee pressure (star strips, 50% tension) Modalities: Game ready L knee, 34 deg, med compression x10 min      [facility] Adult PT Treatment:                                                DATE: 10/14/22 Pt seen for aquatic therapy today.  Treatment took place in water 3.25-4.5 ft in depth at the [facility]. Temp of water was 91.  Pt entered/exited the pool via stairs with step-to with bilat rail.  * warm up forward/ backward walking and side stepping - multiple laps * L forward step ups x 10, eccentric lowering * SLS with solid noodle press/stomp with intermittent UE on wall x 10 each LE, then hip abdct/knee flexion x 10 with LLE (challenge) * holding noodle:  curtsy lunges (painful in Lt knee after a few reps-stopped); forward walking lunges - cues to correct valgus in Lt knee with flexion  * Holding noodle:   1/2 diamonds (single leg clams) ; heel raises x 10 * Lt quad stretch with ankle supported by solid noodle * foot on 2nd step -Lt  hamstring stretch x 45 sec   Pt requires the buoyancy and hydrostatic pressure of water for support, and to offload joints by unweighting joint load by at least 50 % in navel deep water and by at least 75-80% in chest to neck deep water.  Viscosity of the water is needed for resistance of strengthening. Water current perturbations provides challenge to standing balance requiring increased core activation.   PATIENT EDUCATION:  Education details: Placing support underneath knees in sitting and placed between knees when sleeping Person educated: Patient Education method: Explanation, Demonstration, and Handouts Education comprehension: verbalized understanding, returned demonstration, and needs further education  HOME EXERCISE PROGRAM: Access Code: [email or web address] URL: [email or web address] Date: 11/04/2022 Prepared by: Helane Gunther  Exercises - Hip Abduction with Resistance Loop  - 1 x daily - 7 x weekly - 2 sets - 10 reps - Hip Extension with Resistance Loop  - 1 x daily - 7 x weekly - 2 sets - 10 reps - Seated Piriformis Stretch  - 1 x daily - 7 x weekly - 3 sets - 3 reps - 30 sec hold - Sit to Stand with Armchair  - 1 x daily - 7 x weekly - 3 sets -  10 reps - Tandem Stance with Chair Support  - 1 x daily - 7 x weekly - 1 sets - 3 reps - 30 sec hold - Forward Step Down Touch with Heel  - 1 x daily - 7 x weekly - 2 sets - 10 reps - Glute Med Wall Lean  - 1 x daily - 7 x weekly - 3 sets - 10 reps  ASSESSMENT:  CLINICAL IMPRESSION:  Left AROM knee flexion improved by 10 degrees as compared to previous measurement. Mild increase in hip strength as measured by MMT, most notably with hip extension. Minimal change in knee pain during stair navigation as compared to start of POC. Decreased knee valgus demonstrated during squat lifting, however continues to  exhibit mild knee vlagus and internal Moderate hip fatigue exhibited during straight leg hip abduction in side lying. Tactile cues provided by ball at the ankles and resistance band above knees to improve hip and ankle stability. Patient will continue to benefit from skilled therapy to progress LE strengthening and decrease knee pain with stair navigation and improve stability and mobility with ADLs.    OBJECTIVE IMPAIRMENTS: Abnormal gait, decreased activity tolerance, decreased balance, decreased endurance, decreased mobility, difficulty walking, decreased ROM, decreased strength, increased fascial restrictions, increased muscle spasms, impaired sensation, improper body mechanics, and pain.    GOALS: Goals reviewed with patient? Yes  SHORT TERM GOALS: Target date: 09/21/2022  Pt will be ind with initial HEP Baseline: Goal status: IN PROGRESS 1/3: MET   LONG TERM GOALS: Target date: 12/05/2022  Pt will be ind with advancement and progression of HEP Baseline:  Goal status: MET 1/10   2.  Pt will report improved pain by at least 50% with stair ascent/descent Baseline:  Goal status: 1/10: PROGRESSING (L>R descend) Goal status: MOSTLY MET (30% decrease in pain) 0n 11/04/22  3.  Pt will be able to perform 5x STS without UE support to demo improved functional LE strength Baseline:  Goal status: IN PROGRESS 10/20/22 mild pain on last few reps   4.  Pt will demo improved body mechanics with less knee valgus during lifting Baseline:  Goal status: MET 1/10  5.  Pt will have improved FOTO score to >/= 67 Baseline:  Goal status: IN PROGRESS (49 - regression from 55) 11/04/22    PLAN:  PT FREQUENCY: 2x/week  PT DURATION: 8 weeks  PLANNED INTERVENTIONS: Therapeutic exercises, Therapeutic activity, Neuromuscular re-education, Balance training, Gait training, Patient/Family education, Self Care, Joint mobilization, Stair training, Orthotic/Fit training, Aquatic Therapy, Dry Needling,  Electrical stimulation, Cryotherapy, Moist heat, Taping, Vasopneumatic device, Ultrasound, Ionotophoresis 83m/ml Dexamethasone, Manual therapy, and Re-evaluation  PLAN FOR NEXT SESSION: Continue hip, quad, glute, core strengthening (Ortho appt on 3/8)   KHelane Gunther PTA 11/04/2022

## 2022-11-07 ENCOUNTER — Ambulatory Visit: Payer: BC Managed Care – PPO

## 2022-11-07 DIAGNOSIS — R262 Difficulty in walking, not elsewhere classified: Secondary | ICD-10-CM | POA: Diagnosis not present

## 2022-11-07 DIAGNOSIS — R296 Repeated falls: Secondary | ICD-10-CM | POA: Diagnosis not present

## 2022-11-07 DIAGNOSIS — M6281 Muscle weakness (generalized): Secondary | ICD-10-CM

## 2022-11-07 DIAGNOSIS — G8929 Other chronic pain: Secondary | ICD-10-CM | POA: Diagnosis not present

## 2022-11-07 DIAGNOSIS — M545 Low back pain, unspecified: Secondary | ICD-10-CM | POA: Diagnosis not present

## 2022-11-07 DIAGNOSIS — M25562 Pain in left knee: Secondary | ICD-10-CM | POA: Diagnosis not present

## 2022-11-07 NOTE — Therapy (Signed)
OUTPATIENT PHYSICAL THERAPY TREATMENT  Patient Name: Wanda Gonzales MRN: [id number] DOB:1955/11/06, 67 y.o., female Today's Date: 11/07/2022   *Therapy Dates: 08/23/2022 - 11/04/2022  END OF SESSION:  PT End of Session - 11/07/22 1106     Visit Number 21    Date for PT Re-Evaluation 12/05/22    Authorization Type BCBS Medicare    PT Start Time 1105    PT Stop Time 1153    PT Time Calculation (min) 48 min    Activity Tolerance Patient tolerated treatment well    Behavior During Therapy WFL for tasks assessed/performed                Past Medical History:  Diagnosis Date   Abnormal Pap smear, atypical squamous cells of undetermined sign (ASC-US) 1998   treated with cryo, CIN I   ADD (attention deficit disorder) 02-23-12   easily distracted.   Arthritis 02-23-12   Rt. hip and lt. arm   Closed displaced fracture of fifth metatarsal bone of left foot 03/31/2017   Depression with anxiety    History of osteoporosis    Kidney calculi 07/2015   Kidney stone    PONV (postoperative nausea and vomiting) 02-23-12   always has PONV   Post-nasal drainage 02-23-12   frequent a problem,causes a cough and mucus buildup in throat   Urethral polyp 1998   Dr. Hartley Barefoot   Varicose veins    Venous insufficiency    chronic-denies any problems   Past Surgical History:  Procedure Laterality Date   blood clot removed Left 01/2012   blood clot removed from left hand    BREAST BIOPSY Right 02/14/2012   Fibroadenoma   COLONOSCOPY  08/2003   repeat 5 yrs.   COLONOSCOPY W/ BIOPSIES  12/2008   1 polyp recheck 5 yrs   EXCISION/RELEASE BURSA HIP  02/29/2012   HAND SURGERY  02/23/2012   "blood clot" excised palm left hand 01-18-12   KNEE ARTHROSCOPY W/ MENISCAL REPAIR  10/2012   KNEE ARTHROSCOPY WITH EXCISION BAKER'S CYST Left 10/2012   meniscal repair   ORIF METATARSAL FRACTURE Left 04/07/2017   Left 5th Metatarsal   SHOULDER SURGERY Right 02/2015   Dr. 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   Patient Active Problem List   Diagnosis Date Noted   IBS (irritable bowel syndrome with diarrhea) 08/17/2022   Hearing aid worn 02/10/2022   Trochanteric bursitis of left hip 02/06/2022   Frequent falls 11/26/2020   Depression with anxiety    History of osteoporosis    Age-related osteoporosis without current pathological fracture 07/29/2020   Osteoporosis 07/27/2020   Rhinitis 03/18/2020   Pain in left knee 09/07/2018   Seborrheic keratosis 11/23/2017   S/P TKR (total knee replacement), left 08/25/2016   Benign paroxysmal positional vertigo 08/25/2016   Mid back pain, chronic 12/16/2015   Cervical strain 11/21/2014   DDD (degenerative disc disease), lumbar 02/17/2014   Vitamin D deficiency 03/18/2013   Trochanteric bursitis of right hip 02/29/2012   INSOMNIA 04/13/2010   Hyperlipidemia 09/17/2007   CERVICAL POLYP 09/17/2007   Attention deficit disorder 09/11/2007   UNSPECIFIED VENOUS INSUFFICIENCY 08/29/2007    PCP: Beatrice Lecher  REFERRING PROVIDER: Lemont Fillers DIAG: Z47.1: Aftercare following joint replacement surgery; [phone] presence of left artificial knee joint  THERAPY DIAG:  Chronic pain of left knee  Muscle weakness (generalized)  Difficulty in walking,  not elsewhere classified  Repeated falls  Chronic bilateral low back pain without sciatica  Rationale for Evaluation and Treatment: Rehabilitation  ONSET DATE: ~4 years ago  SUBJECTIVE:   SUBJECTIVE STATEMENT:  Patient reports she is feeling frustrated with the pain and swelling not getting better. Patient states she had a death in the family over the weekend.   PERTINENT HISTORY: L TKA 4 years ago, L hip surgery, hx of L baker's cyst (removed) From eval: Pt reports L knee pain. Pt had L TKA 4 years ago. Pt had scar tissue removed May 24, 2022 but still getting pain. Pt has been going to pool  and doing exercises. Pt has been walking. Has been using voltaren on her knee.   PAIN:  Are you having pain? Yes: NPRS scale: 5/10 Pain location: Lt knee at joint line (medial/lateral) Pain description: Can be sharp, achy and dull Aggravating factors: Sit to stand, stairs, and getting out of bed; fatigue Relieving factors: Water therapy  PRECAUTIONS: None  WEIGHT BEARING RESTRICTIONS: No  FALLS:  Has patient fallen in last 6 months? Yes. Number of falls 5 or 6 times (normally to the left), last fall in October  OCCUPATION: Teaches art 2 days/wk  PATIENT GOALS: Improve knee pain with mobility  NEXT MD VISIT: n/a  OBJECTIVE:  From eval unless otherwise noted  PALPATION: TTP L anterior gastroc, patellar tendon, medial and lateral knee joint line  LOWER EXTREMITY ROM:  Active ROM Right eval Left eval Right 1/10 Left  1/10 Right  11/04/22 Left 11/04/22  Knee flexion 120 115 130 122 140 125  Knee extension 0 0       (Blank rows = not tested)  LOWER EXTREMITY MMT:  MMT Right eval Left eval Right 1/10 Left 1/10 Right 11/04/22 Left 11/04/22  Hip flexion 5 3+ 4 4- 4- 4-  Hip extension 3 3 3+ 4- 4- 4-  Hip abduction 4- 3 4+ 4- 4 4  Hip adduction 5 4-  4+    Hip internal rotation        Hip external rotation        Knee flexion 5 5      Knee extension 5 5      Ankle dorsiflexion 5 5      Ankle plantarflexion 5 5      Ankle inversion 5 4      Ankle eversion 5 5       (Blank rows = not tested)  LOWER EXTREMITY SPECIAL TESTS:  Knee special tests: Patellafemoral apprehension test: positive   FUNCTIONAL TESTS:  SLS on R 4 sec, on L attempts but unable to hold R LE back tandem 30 sec, L LE back tandem 30 sec Painful sit to stand and squatting with increased L knee valgus noted Unable to perform stairs with reciprocal pattern -- L knee valgus noted with ascent and descent  GAIT: Distance walked: 150 Assistive device utilized: None Level of assistance: Complete  Independence Comments: antalgic, decreased stance on L, trendelenburg pattern   [facility] Adult PT Treatment:                                                DATE: 11/07/2022 Therapeutic Exercise: Supine: Diaphragmatic breathing+ TA activation Hooklying foot float Bent knee fall out (pre-pilates) SLR small ROM, focus on TA activation & pelvic stability Modified single  leg stretch --> double leg stretch Dead bug progression S/L straight leg hip abd (foot slide up/down bosu platform) S/L straight leg raises fwd/bkwd over yoga block Modalities: Game ready L knee, 34 deg, med compression x10 min     [facility] Adult PT Treatment:                                                DATE: 11/04/2022 Therapeutic Exercise: NuStep L6 x 67mn Hip MMT Knee AROM measurements S/L straight leg hip abd (foot propped on 2 yoga blocks) ITB stretch w/strap STS YTB above knees --> from sitting on yoga blocks & focus on L knee stability Hooklying hip abd isometric w/YTB + ankle add isometric with ball  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  Treatment took place [place] 3.25[place]4.[place] Temp of water was 91.  Pt entered/exited the pool via stairs with step-to with bilat rail.  * warm up forward/ backward walking and side stepping - multiple laps * L forward step ups x 10, eccentric lowering * SLS with solid noodle press/stomp with intermittent UE on wall x 10 each LE, then hip abdct/knee flexion x 10 with LLE (challenge) * holding noodle:  curtsy lunges (painful in Lt knee after a few reps-stopped); forward walking lunges - cues to correct valgus in Lt knee with flexion  * Holding noodle:  1/2 diamonds (single leg clams) ; heel raises x 10 * Lt quad stretch with ankle supported by solid noodle *  foot on 2nd step -Lt  hamstring stretch x 45 sec   Pt requires the buoyancy and hydrostatic pressure of water for support, and to offload joints by unweighting joint load by at least 50 % in navel deep water and by at least 75-80% in chest to neck deep water.  Viscosity of the water is needed for resistance of strengthening. Water current perturbations provides challenge to standing balance requiring increased core activation.   PATIENT EDUCATION:  Education details: Placing support underneath knees in sitting and placed between knees when sleeping Person educated: Patient Education method: Explanation, Demonstration, and Handouts Education comprehension: verbalized understanding, returned demonstration, and needs further education  HOME EXERCISE PROGRAM: Access Code: [email or web address] URL: [email or web address] Date: 11/07/2022 Prepared by: KHelane Gunther Exercises - Hip Abduction with Resistance Loop  - 1 x daily - 7 x weekly - 2 sets - 10 reps - Hip Extension with Resistance Loop  - 1 x daily - 7 x weekly - 2 sets - 10 reps - Seated Piriformis Stretch  - 1 x daily - 7 x weekly - 3 sets - 3 reps - 30 sec hold - Sit to Stand with Armchair  - 1 x daily - 7 x weekly - 3 sets - 10 reps - Tandem Stance with Chair Support  - 1 x daily - 7 x weekly - 1 sets - 3 reps - 30 sec  hold - Forward Step Down Touch with Heel  - 1 x daily - 7 x weekly - 2 sets - 10 reps - [place]  - 1 x daily - 7 x weekly - 3 sets - 10 reps - Supine Dead Bug with Leg Extension  - 1 x daily - 7 x weekly - 3 sets - 10 reps - Single Leg Stretch  - 1 x daily - 7 x weekly - 3 sets - 10 reps - Beginner Double Leg Stretch  - 1 x daily - 7 x weekly - 3 sets - 10 reps - Sidelying Hip Abduction  - 1 x daily - 7 x weekly - 3 sets - 10 reps  ASSESSMENT:  CLINICAL IMPRESSION:  Core strengthening interventions incorporated to progress postural stability and balance. Verbal and tactile cues improved TA activation  and ribcage stability during diaphragmatic breathing with abdominal bracing. Moderate fatigue noted in L glute during side lying hip abduction.     OBJECTIVE IMPAIRMENTS: Abnormal gait, decreased activity tolerance, decreased balance, decreased endurance, decreased mobility, difficulty walking, decreased ROM, decreased strength, increased fascial restrictions, increased muscle spasms, impaired sensation, improper body mechanics, and pain.    GOALS: Goals reviewed with patient? Yes  SHORT TERM GOALS: Target date: 09/21/2022  Pt will be ind with initial HEP Baseline: Goal status: IN PROGRESS 1/3: MET   LONG TERM GOALS: Target date: 12/05/2022  Pt will be ind with advancement and progression of HEP Baseline:  Goal status: MET 1/10   2.  Pt will report improved pain by at least 50% with stair ascent/descent Baseline:  Goal status: 1/10: PROGRESSING (L>R descend) Goal status: MOSTLY MET (30% decrease in pain) 0n 11/04/22  3.  Pt will be able to perform 5x STS without UE support to demo improved functional LE strength Baseline:  Goal status: IN PROGRESS 10/20/22 mild pain on last few reps   4.  Pt will demo improved body mechanics with less knee valgus during lifting Baseline:  Goal status: MET 1/10  5.  Pt will have improved FOTO score to >/= 67 Baseline:  Goal status: IN PROGRESS (49 - regression from 55) 11/04/22    PLAN:  PT FREQUENCY: 2x/week  PT DURATION: 8 weeks  PLANNED INTERVENTIONS: Therapeutic exercises, Therapeutic activity, Neuromuscular re-education, Balance training, Gait training, Patient/Family education, Self Care, Joint mobilization, Stair training, Orthotic/Fit training, Aquatic Therapy, Dry Needling, Electrical stimulation, Cryotherapy, Moist heat, Taping, Vasopneumatic device, Ultrasound, Ionotophoresis 46m/ml Dexamethasone, Manual therapy, and Re-evaluation  PLAN FOR NEXT SESSION: Continue hip, quad, glute, core strengthening (Ortho appt on  3/8)   KHelane Gunther PTA 11/07/2022

## 2022-11-09 ENCOUNTER — Ambulatory Visit: Payer: BC Managed Care – PPO

## 2022-11-09 DIAGNOSIS — M6281 Muscle weakness (generalized): Secondary | ICD-10-CM | POA: Diagnosis not present

## 2022-11-09 DIAGNOSIS — R296 Repeated falls: Secondary | ICD-10-CM

## 2022-11-09 DIAGNOSIS — M545 Low back pain, unspecified: Secondary | ICD-10-CM | POA: Diagnosis not present

## 2022-11-09 DIAGNOSIS — G8929 Other chronic pain: Secondary | ICD-10-CM | POA: Diagnosis not present

## 2022-11-09 DIAGNOSIS — R262 Difficulty in walking, not elsewhere classified: Secondary | ICD-10-CM | POA: Diagnosis not present

## 2022-11-09 DIAGNOSIS — M25562 Pain in left knee: Secondary | ICD-10-CM | POA: Diagnosis not present

## 2022-11-09 NOTE — Therapy (Signed)
OUTPATIENT PHYSICAL THERAPY TREATMENT  Patient Name: Wanda Gonzales MRN: [id number] DOB:30-Dec-1955, 67 y.o., female Today's Date: 11/09/2022   *Therapy Dates: 08/23/2022 - 11/04/2022  END OF SESSION:  PT End of Session - 11/09/22 0955     Visit Number 22    Date for PT Re-Evaluation 12/05/22    Authorization Type BCBS Medicare    PT Start Time 0930    PT Stop Time 1018    PT Time Calculation (min) 48 min    Activity Tolerance Patient tolerated treatment well    Behavior During Therapy WFL for tasks assessed/performed                Past Medical History:  Diagnosis Date   Abnormal Pap smear, atypical squamous cells of undetermined sign (ASC-US) 1998   treated with cryo, CIN I   ADD (attention deficit disorder) 02-23-12   easily distracted.   Arthritis 02-23-12   Rt. hip and lt. arm   Closed displaced fracture of fifth metatarsal bone of left foot 03/31/2017   Depression with anxiety    History of osteoporosis    Kidney calculi 07/2015   Kidney stone    PONV (postoperative nausea and vomiting) 02-23-12   always has PONV   Post-nasal drainage 02-23-12   frequent a problem,causes a cough and mucus buildup in throat   Urethral polyp 1998   Dr. Hartley Barefoot   Varicose veins    Venous insufficiency    chronic-denies any problems   Past Surgical History:  Procedure Laterality Date   blood clot removed Left 01/2012   blood clot removed from left hand    BREAST BIOPSY Right 02/14/2012   Fibroadenoma   COLONOSCOPY  08/2003   repeat 5 yrs.   COLONOSCOPY W/ BIOPSIES  12/2008   1 polyp recheck 5 yrs   EXCISION/RELEASE BURSA HIP  02/29/2012   HAND SURGERY  02/23/2012   "blood clot" excised palm left hand 01-18-12   KNEE ARTHROSCOPY W/ MENISCAL REPAIR  10/2012   KNEE ARTHROSCOPY WITH EXCISION BAKER'S CYST Left 10/2012   meniscal repair   ORIF METATARSAL FRACTURE Left 04/07/2017   Left 5th Metatarsal   SHOULDER SURGERY Right 02/2015   Dr. 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   Patient Active Problem List   Diagnosis Date Noted   IBS (irritable bowel syndrome with diarrhea) 08/17/2022   Hearing aid worn 02/10/2022   Trochanteric bursitis of left hip 02/06/2022   Frequent falls 11/26/2020   Depression with anxiety    History of osteoporosis    Age-related osteoporosis without current pathological fracture 07/29/2020   Osteoporosis 07/27/2020   Rhinitis 03/18/2020   Pain in left knee 09/07/2018   Seborrheic keratosis 11/23/2017   S/P TKR (total knee replacement), left 08/25/2016   Benign paroxysmal positional vertigo 08/25/2016   Mid back pain, chronic 12/16/2015   Cervical strain 11/21/2014   DDD (degenerative disc disease), lumbar 02/17/2014   Vitamin D deficiency 03/18/2013   Trochanteric bursitis of right hip 02/29/2012   INSOMNIA 04/13/2010   Hyperlipidemia 09/17/2007   CERVICAL POLYP 09/17/2007   Attention deficit disorder 09/11/2007   UNSPECIFIED VENOUS INSUFFICIENCY 08/29/2007    PCP: Beatrice Lecher  REFERRING PROVIDER: Lemont Fillers DIAG: Z47.1: Aftercare following joint replacement surgery; [phone] presence of left artificial knee joint  THERAPY DIAG:  Chronic pain of left knee  Difficulty in walking, not elsewhere classified  Muscle weakness (generalized)  Repeated falls  Chronic bilateral low back pain without sciatica  Rationale for Evaluation and Treatment: Rehabilitation  ONSET DATE: ~4 years ago  SUBJECTIVE:   SUBJECTIVE STATEMENT:  Patient reports no changes in pain and swelling for a few weeks now. Patient states she had pain in L knee when standing up from sitting.    PERTINENT HISTORY: L TKA 4 years ago, L hip surgery, hx of L baker's cyst (removed) From eval: Pt reports L knee pain. Pt had L TKA 4 years ago. Pt had scar tissue removed May 24, 2022 but still getting pain. Pt has been going to pool and doing  exercises. Pt has been walking. Has been using voltaren on her knee.   PAIN:  Are you having pain? Yes: NPRS scale: 5/10 Pain location: Lt knee at joint line (medial/lateral) Pain description: Can be sharp, achy and dull Aggravating factors: Sit to stand, stairs, and getting out of bed; fatigue Relieving factors: Water therapy  PRECAUTIONS: None  WEIGHT BEARING RESTRICTIONS: No  FALLS:  Has patient fallen in last 6 months? Yes. Number of falls 5 or 6 times (normally to the left), last fall in October  OCCUPATION: Teaches art 2 days/wk  PATIENT GOALS: Improve knee pain with mobility  NEXT MD VISIT: n/a  OBJECTIVE:  From eval unless otherwise noted  PALPATION: TTP L anterior gastroc, patellar tendon, medial and lateral knee joint line  LOWER EXTREMITY ROM:  Active ROM Right eval Left eval Right 1/10 Left  1/10 Right  11/04/22 Left 11/04/22  Knee flexion 120 115 130 122 140 125  Knee extension 0 0       (Blank rows = not tested)  LOWER EXTREMITY MMT:  MMT Right eval Left eval Right 1/10 Left 1/10 Right 11/04/22 Left 11/04/22  Hip flexion 5 3+ 4 4- 4- 4-  Hip extension 3 3 3+ 4- 4- 4-  Hip abduction 4- 3 4+ 4- 4 4  Hip adduction 5 4-  4+    Hip internal rotation        Hip external rotation        Knee flexion 5 5      Knee extension 5 5      Ankle dorsiflexion 5 5      Ankle plantarflexion 5 5      Ankle inversion 5 4      Ankle eversion 5 5       (Blank rows = not tested)  LOWER EXTREMITY SPECIAL TESTS:  Knee special tests: Patellafemoral apprehension test: positive   FUNCTIONAL TESTS:  SLS on R 4 sec, on L attempts but unable to hold R LE back tandem 30 sec, L LE back tandem 30 sec Painful sit to stand and squatting with increased L knee valgus noted Unable to perform stairs with reciprocal pattern -- L knee valgus noted with ascent and descent  GAIT: Distance walked: 150 Assistive device utilized: None Level of assistance: Complete  Independence Comments: antalgic, decreased stance on L, trendelenburg pattern   [facility] Adult PT Treatment:                                                DATE: 11/09/2022 Therapeutic Exercise: Supine: Hip abd isometric w/belt 10x5" Bridges + hip abd isometric 2x0 Small range core marching (foot float + TA activation) Bent knee fall  out (pre-pilates) L SLR small ROM, focus on TA activation & pelvic stability x10 S/L straight leg hip abd x10 --> YTB above knees x10 --> YTB + slight hip ext 3x5 Single leg stretch x10 Double leg stretch 2x5 STS + gait belt above knees (isometric hip abd) 2x10 Modalities: Game ready L knee, 34 deg, med compression x10 min     [facility] Adult PT Treatment:                                                DATE: 11/07/2022 Therapeutic Exercise: Supine: Diaphragmatic breathing+ TA activation Hooklying foot float Bent knee fall out (pre-pilates) SLR small ROM, focus on TA activation & pelvic stability Modified single leg stretch --> double leg stretch Dead bug progression S/L straight leg hip abd (foot slide up/down bosu platform) S/L straight leg raises fwd/bkwd over yoga block Modalities: Game ready L knee, 34 deg, med compression x10 min     [facility] Adult PT Treatment:                                                DATE: 10/14/22 Pt seen for aquatic therapy today.  Treatment took place in water 3.25-4.5 ft in depth at the [place]. Temp of water was 91.  Pt entered/exited the pool via stairs with step-to with bilat rail.  * warm up forward/ backward walking and side stepping - multiple laps * L forward step ups x 10, eccentric lowering * SLS with solid noodle press/stomp with intermittent UE on wall x 10 each LE, then hip abdct/knee flexion x 10 with LLE (challenge) * holding noodle:  curtsy lunges (painful in Lt knee after a few reps-stopped); forward walking lunges - cues to correct valgus in Lt knee with flexion  * Holding noodle:  1/2  diamonds (single leg clams) ; heel raises x 10 * Lt quad stretch with ankle supported by solid noodle * foot on 2nd step -Lt  hamstring stretch x 45 sec   Pt requires the buoyancy and hydrostatic pressure of water for support, and to offload joints by unweighting joint load by at least 50 % in navel deep water and by at least 75-80% in chest to neck deep water.  Viscosity of the water is needed for resistance of strengthening. Water current perturbations provides challenge to standing balance requiring increased core activation.   PATIENT EDUCATION:  Education details: Placing support underneath knees in sitting and placed between knees when sleeping Person educated: Patient Education method: Explanation, Demonstration, and Handouts Education comprehension: verbalized understanding, returned demonstration, and needs further education  HOME EXERCISE PROGRAM: Access Code: [email or web address] URL: [email or web address] Date: 11/07/2022 Prepared by: Helane Gunther  Exercises - Hip Abduction with Resistance Loop  - 1 x daily - 7 x weekly - 2 sets - 10 reps - Hip Extension with Resistance Loop  - 1 x daily - 7 x weekly - 2 sets - 10 reps - Seated Piriformis Stretch  - 1 x daily - 7 x weekly - 3 sets - 3 reps - 30 sec hold - Sit to Stand with Armchair  - 1 x daily - 7 x weekly - 3 sets - 10 reps - Tandem Stance with Chair Support  -  1 x daily - 7 x weekly - 1 sets - 3 reps - 30 sec hold - Forward Step Down Touch with Heel  - 1 x daily - 7 x weekly - 2 sets - 10 reps - [place]  - 1 x daily - 7 x weekly - 3 sets - 10 reps - Supine Dead Bug with Leg Extension  - 1 x daily - 7 x weekly - 3 sets - 10 reps - Single Leg Stretch  - 1 x daily - 7 x weekly - 3 sets - 10 reps - Beginner Double Leg Stretch  - 1 x daily - 7 x weekly - 3 sets - 10 reps - Sidelying Hip Abduction  - 1 x daily - 7 x weekly - 3 sets - 10 reps  ASSESSMENT:  CLINICAL IMPRESSION:  Glute and core strengthening  continued to progress hip strength for improved knee stability. Isometric hip abd incorporated during supine bridges and sit to stand; patient able to complete exercises with decreased anterior knee pain. Moderate fatigue exhibited in abdominals after completing pilates abdominal exercises; tactile cues and hands-on assist provided with LE during double leg stretch to improve postural alignment and core stability.   OBJECTIVE IMPAIRMENTS: Abnormal gait, decreased activity tolerance, decreased balance, decreased endurance, decreased mobility, difficulty walking, decreased ROM, decreased strength, increased fascial restrictions, increased muscle spasms, impaired sensation, improper body mechanics, and pain.    GOALS: Goals reviewed with patient? Yes  SHORT TERM GOALS: Target date: 09/21/2022  Pt will be ind with initial HEP Baseline: Goal status: IN PROGRESS 1/3: MET   LONG TERM GOALS: Target date: 12/05/2022  Pt will be ind with advancement and progression of HEP Baseline:  Goal status: MET 1/10   2.  Pt will report improved pain by at least 50% with stair ascent/descent Baseline:  Goal status: 1/10: PROGRESSING (L>R descend) Goal status: MOSTLY MET (30% decrease in pain) 0n 11/04/22  3.  Pt will be able to perform 5x STS without UE support to demo improved functional LE strength Baseline:  Goal status: IN PROGRESS 10/20/22 mild pain on last few reps   4.  Pt will demo improved body mechanics with less knee valgus during lifting Baseline:  Goal status: MET 1/10  5.  Pt will have improved FOTO score to >/= 67 Baseline:  Goal status: IN PROGRESS (49 - regression from 55) 11/04/22    PLAN:  PT FREQUENCY: 2x/week  PT DURATION: 8 weeks  PLANNED INTERVENTIONS: Therapeutic exercises, Therapeutic activity, Neuromuscular re-education, Balance training, Gait training, Patient/Family education, Self Care, Joint mobilization, Stair training, Orthotic/Fit training, Aquatic Therapy, Dry  Needling, Electrical stimulation, Cryotherapy, Moist heat, Taping, Vasopneumatic device, Ultrasound, Ionotophoresis 36m/ml Dexamethasone, Manual therapy, and Re-evaluation  PLAN FOR NEXT SESSION: Assess knee pain & alignment/mechanics. Continue hip, quad, glute, core strengthening (Ortho appt on 3/8)   KHelane Gunther PTA 11/09/2022

## 2022-11-10 ENCOUNTER — Ambulatory Visit (HOSPITAL_BASED_OUTPATIENT_CLINIC_OR_DEPARTMENT_OTHER): Payer: Medicare Other | Admitting: Physical Therapy

## 2022-11-14 ENCOUNTER — Telehealth: Payer: Self-pay | Admitting: Family Medicine

## 2022-11-14 NOTE — Telephone Encounter (Signed)
Called patient to schedule Medicare Annual Wellness Visit (AWV). Left message for patient to call back and schedule Medicare Annual Wellness Visit (AWV).  Last date of AWV: 07/05/22  Please schedule an appointment at any time with Nurse Health Advisor.  If any questions, please contact me at [phone].  Thank you ,  Lin Givens Patient Access Advocate II Direct Dial: [phone]

## 2022-11-16 ENCOUNTER — Ambulatory Visit: Payer: BC Managed Care – PPO

## 2022-11-16 DIAGNOSIS — R296 Repeated falls: Secondary | ICD-10-CM

## 2022-11-16 DIAGNOSIS — G8929 Other chronic pain: Secondary | ICD-10-CM

## 2022-11-16 DIAGNOSIS — M6281 Muscle weakness (generalized): Secondary | ICD-10-CM | POA: Diagnosis not present

## 2022-11-16 DIAGNOSIS — R262 Difficulty in walking, not elsewhere classified: Secondary | ICD-10-CM | POA: Diagnosis not present

## 2022-11-16 DIAGNOSIS — M25562 Pain in left knee: Secondary | ICD-10-CM | POA: Diagnosis not present

## 2022-11-16 DIAGNOSIS — M545 Low back pain, unspecified: Secondary | ICD-10-CM | POA: Diagnosis not present

## 2022-11-16 NOTE — Therapy (Signed)
OUTPATIENT PHYSICAL THERAPY TREATMENT  Patient Name: Wanda Gonzales MRN: [id number] DOB:04-08-1956, 67 y.o., female Today's Date: 11/16/2022    END OF SESSION:  PT End of Session - 11/16/22 1103     Visit Number 23    Number of Visits 16    Date for PT Re-Evaluation 12/05/22    Authorization Type BCBS Medicare    PT Start Time 1103    PT Stop Time 1150    PT Time Calculation (min) 47 min    Activity Tolerance Patient tolerated treatment well    Behavior During Therapy WFL for tasks assessed/performed                Past Medical History:  Diagnosis Date   Abnormal Pap smear, atypical squamous cells of undetermined sign (ASC-US) 1998   treated with cryo, CIN I   ADD (attention deficit disorder) 02-23-12   easily distracted.   Arthritis 02-23-12   Rt. hip and lt. arm   Closed displaced fracture of fifth metatarsal bone of left foot 03/31/2017   Depression with anxiety    History of osteoporosis    Kidney calculi 07/2015   Kidney stone    PONV (postoperative nausea and vomiting) 02-23-12   always has PONV   Post-nasal drainage 02-23-12   frequent a problem,causes a cough and mucus buildup in throat   Urethral polyp 1998   Dr. Hartley Barefoot   Varicose veins    Venous insufficiency    chronic-denies any problems   Past Surgical History:  Procedure Laterality Date   blood clot removed Left 01/2012   blood clot removed from left hand    BREAST BIOPSY Right 02/14/2012   Fibroadenoma   COLONOSCOPY  08/2003   repeat 5 yrs.   COLONOSCOPY W/ BIOPSIES  12/2008   1 polyp recheck 5 yrs   EXCISION/RELEASE BURSA HIP  02/29/2012   HAND SURGERY  02/23/2012   "blood clot" excised palm left hand 01-18-12   KNEE ARTHROSCOPY W/ MENISCAL REPAIR  10/2012   KNEE ARTHROSCOPY WITH EXCISION BAKER'S CYST Left 10/2012   meniscal repair   ORIF METATARSAL FRACTURE Left 04/07/2017   Left 5th Metatarsal   SHOULDER SURGERY Right 02/2015   Dr. 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   Patient Active Problem List   Diagnosis Date Noted   IBS (irritable bowel syndrome with diarrhea) 08/17/2022   Hearing aid worn 02/10/2022   Trochanteric bursitis of left hip 02/06/2022   Frequent falls 11/26/2020   Depression with anxiety    History of osteoporosis    Age-related osteoporosis without current pathological fracture 07/29/2020   Osteoporosis 07/27/2020   Rhinitis 03/18/2020   Pain in left knee 09/07/2018   Seborrheic keratosis 11/23/2017   S/P TKR (total knee replacement), left 08/25/2016   Benign paroxysmal positional vertigo 08/25/2016   Mid back pain, chronic 12/16/2015   Cervical strain 11/21/2014   DDD (degenerative disc disease), lumbar 02/17/2014   Vitamin D deficiency 03/18/2013   Trochanteric bursitis of right hip 02/29/2012   INSOMNIA 04/13/2010   Hyperlipidemia 09/17/2007   CERVICAL POLYP 09/17/2007   Attention deficit disorder 09/11/2007   UNSPECIFIED VENOUS INSUFFICIENCY 08/29/2007    PCP: Beatrice Lecher  REFERRING PROVIDER: Lemont Fillers DIAG: Z47.1: Aftercare following joint replacement surgery; [phone] presence of left artificial knee joint  THERAPY DIAG:  Chronic pain of left knee  Muscle weakness (generalized)  Difficulty  in walking, not elsewhere classified  Repeated falls  Chronic bilateral low back pain without sciatica  Rationale for Evaluation and Treatment: Rehabilitation  ONSET DATE: ~4 years ago  SUBJECTIVE:   SUBJECTIVE STATEMENT:  Patient reports her knee pain is waking her up at night, states she continues to have pain in medial/lateral below knee. Patient states she was at a basketball and missed a step walking down stairs and fell; patient states it was a minor fall with no inuries or bruising.   PERTINENT HISTORY: L TKA 4 years ago, L hip surgery, hx of L baker's cyst (removed) From eval: Pt reports L knee pain. Pt had L TKA 4 years  ago. Pt had scar tissue removed May 24, 2022 but still getting pain. Pt has been going to pool and doing exercises. Pt has been walking. Has been using voltaren on her knee.   PAIN:  Are you having pain? Yes: NPRS scale: 5/10 Pain location: Lt knee at joint line (medial/lateral) Pain description: Can be sharp, achy and dull Aggravating factors: Sit to stand, stairs, and getting out of bed; fatigue Relieving factors: Water therapy  PRECAUTIONS: None  WEIGHT BEARING RESTRICTIONS: No  FALLS:  Has patient fallen in last 6 months? Yes. Number of falls 5 or 6 times (normally to the left), last fall in October  OCCUPATION: Teaches art 2 days/wk  PATIENT GOALS: Improve knee pain with mobility  NEXT MD VISIT: n/a  OBJECTIVE:  From eval unless otherwise noted  PALPATION: TTP L anterior gastroc, patellar tendon, medial and lateral knee joint line  LOWER EXTREMITY ROM:  Active ROM Right eval Left eval Right 1/10 Left  1/10 Right  11/04/22 Left 11/04/22  Knee flexion 120 115 130 122 140 125  Knee extension 0 0       (Blank rows = not tested)  LOWER EXTREMITY MMT:  MMT Right eval Left eval Right 1/10 Left 1/10 Right 11/04/22 Left 11/04/22  Hip flexion 5 3+ 4 4- 4- 4-  Hip extension 3 3 3+ 4- 4- 4-  Hip abduction 4- 3 4+ 4- 4 4  Hip adduction 5 4-  4+    Hip internal rotation        Hip external rotation        Knee flexion 5 5      Knee extension 5 5      Ankle dorsiflexion 5 5      Ankle plantarflexion 5 5      Ankle inversion 5 4      Ankle eversion 5 5       (Blank rows = not tested)  LOWER EXTREMITY SPECIAL TESTS:  Knee special tests: Patellafemoral apprehension test: positive   FUNCTIONAL TESTS:  SLS on R 4 sec, on L attempts but unable to hold R LE back tandem 30 sec, L LE back tandem 30 sec Painful sit to stand and squatting with increased L knee valgus noted Unable to perform stairs with reciprocal pattern -- L knee valgus noted with ascent and  descent  GAIT: Distance walked: 150 Assistive device utilized: None Level of assistance: Complete Independence Comments: antalgic, decreased stance on L, trendelenburg pattern   [facility] Adult PT Treatment:                                                DATE: 11/16/2022 Therapeutic Exercise:  Gastroc stretch on 4" step + IR Runner's lunge stretch + forefoot propped on yaga mat roll Fwd amb focuing on proper gait mechanics Manual Therapy: Popliteal MWM IASTM/STM popliteal, medial/lateral gastroc soleus   [facility] Adult PT Treatment:                                                DATE: 11/09/2022 Therapeutic Exercise: Supine: Hip abd isometric w/belt 10x5" Bridges + hip abd isometric 2x0 Small range core marching (foot float + TA activation) Bent knee fall out (pre-pilates) L SLR small ROM, focus on TA activation & pelvic stability x10 S/L straight leg hip abd x10 --> YTB above knees x10 --> YTB + slight hip ext 3x5 Single leg stretch x10 Double leg stretch 2x5 STS + gait belt above knees (isometric hip abd) 2x10 Modalities: Game ready L knee, 34 deg, med compression x10 min     [facility] Adult PT Treatment:                                                DATE: 10/14/22 Pt seen for aquatic therapy today.  Treatment took place in water 3.25-4.5 ft in depth at the [place]. Temp of water was 91.  Pt entered/exited the pool via stairs with step-to with bilat rail.  * warm up forward/ backward walking and side stepping - multiple laps * L forward step ups x 10, eccentric lowering * SLS with solid noodle press/stomp with intermittent UE on wall x 10 each LE, then hip abdct/knee flexion x 10 with LLE (challenge) * holding noodle:  curtsy lunges (painful in Lt knee after a few reps-stopped); forward walking lunges - cues to correct valgus in Lt knee with flexion  * Holding noodle:  1/2 diamonds (single leg clams) ; heel raises x 10 * Lt quad stretch with ankle supported by solid  noodle * foot on 2nd step -Lt  hamstring stretch x 45 sec   Pt requires the buoyancy and hydrostatic pressure of water for support, and to offload joints by unweighting joint load by at least 50 % in navel deep water and by at least 75-80% in chest to neck deep water.  Viscosity of the water is needed for resistance of strengthening. Water current perturbations provides challenge to standing balance requiring increased core activation.   PATIENT EDUCATION:  Education details: Placing support underneath knees in sitting and placed between knees when sleeping Person educated: Patient Education method: Explanation, Demonstration, and Handouts Education comprehension: verbalized understanding, returned demonstration, and needs further education  HOME EXERCISE PROGRAM: Access Code: [email or web address] URL: [email or web address] Date: 11/07/2022 Prepared by: Helane Gunther  Exercises - Hip Abduction with Resistance Loop  - 1 x daily - 7 x weekly - 2 sets - 10 reps - Hip Extension with Resistance Loop  - 1 x daily - 7 x weekly - 2 sets - 10 reps - Seated Piriformis Stretch  - 1 x daily - 7 x weekly - 3 sets - 3 reps - 30 sec hold - Sit to Stand with Armchair  - 1 x daily - 7 x weekly - 3 sets - 10 reps - Tandem Stance with Chair Support  - 1 x daily - 7 x  weekly - 1 sets - 3 reps - 30 sec hold - Forward Step Down Touch with Heel  - 1 x daily - 7 x weekly - 2 sets - 10 reps - [place]  - 1 x daily - 7 x weekly - 3 sets - 10 reps - Supine Dead Bug with Leg Extension  - 1 x daily - 7 x weekly - 3 sets - 10 reps - Single Leg Stretch  - 1 x daily - 7 x weekly - 3 sets - 10 reps - Beginner Double Leg Stretch  - 1 x daily - 7 x weekly - 3 sets - 10 reps - Sidelying Hip Abduction  - 1 x daily - 7 x weekly - 3 sets - 10 reps  ASSESSMENT:  CLINICAL IMPRESSION:  Focus on manual interventions to address myofascial tightness and tension in posterior knee complex; significant tenderness  with palpitation in medial and lateral gastroc musculature. Internal rotation added during gastroc stretches to address medial tightness; patient reported medial knee felt "looser" afterwards on medial side, however continues to have lateral knee pain/tightness.   OBJECTIVE IMPAIRMENTS: Abnormal gait, decreased activity tolerance, decreased balance, decreased endurance, decreased mobility, difficulty walking, decreased ROM, decreased strength, increased fascial restrictions, increased muscle spasms, impaired sensation, improper body mechanics, and pain.    GOALS: Goals reviewed with patient? Yes  SHORT TERM GOALS: Target date: 09/21/2022  Pt will be ind with initial HEP Baseline: Goal status: IN PROGRESS 1/3: MET   LONG TERM GOALS: Target date: 12/05/2022  Pt will be ind with advancement and progression of HEP Baseline:  Goal status: MET 1/10   2.  Pt will report improved pain by at least 50% with stair ascent/descent Baseline:  Goal status: 1/10: PROGRESSING (L>R descend) Goal status: MOSTLY MET (30% decrease in pain) 0n 11/04/22  3.  Pt will be able to perform 5x STS without UE support to demo improved functional LE strength Baseline:  Goal status: IN PROGRESS 10/20/22 mild pain on last few reps   4.  Pt will demo improved body mechanics with less knee valgus during lifting Baseline:  Goal status: MET 1/10  5.  Pt will have improved FOTO score to >/= 67 Baseline:  Goal status: IN PROGRESS (49 - regression from 55) 11/04/22   PLAN:  PT FREQUENCY: 2x/week  PT DURATION: 8 weeks  PLANNED INTERVENTIONS: Therapeutic exercises, Therapeutic activity, Neuromuscular re-education, Balance training, Gait training, Patient/Family education, Self Care, Joint mobilization, Stair training, Orthotic/Fit training, Aquatic Therapy, Dry Needling, Electrical stimulation, Cryotherapy, Moist heat, Taping, Vasopneumatic device, Ultrasound, Ionotophoresis '4mg'$ /ml Dexamethasone, Manual therapy, and  Re-evaluation  PLAN FOR NEXT SESSION: Assess knee pain & alignment/mechanics. Continue hip, quad, glute, core strengthening (Ortho appt on 3/8)   Helane Gunther, PTA 11/16/2022

## 2022-11-17 ENCOUNTER — Encounter (HOSPITAL_BASED_OUTPATIENT_CLINIC_OR_DEPARTMENT_OTHER): Payer: Self-pay | Admitting: Physical Therapy

## 2022-11-17 ENCOUNTER — Ambulatory Visit (HOSPITAL_BASED_OUTPATIENT_CLINIC_OR_DEPARTMENT_OTHER): Payer: Medicare Other | Attending: Orthopedic Surgery | Admitting: Physical Therapy

## 2022-11-17 DIAGNOSIS — G8929 Other chronic pain: Secondary | ICD-10-CM | POA: Diagnosis not present

## 2022-11-17 DIAGNOSIS — M25562 Pain in left knee: Secondary | ICD-10-CM | POA: Insufficient documentation

## 2022-11-17 DIAGNOSIS — M6281 Muscle weakness (generalized): Secondary | ICD-10-CM | POA: Diagnosis not present

## 2022-11-17 DIAGNOSIS — R262 Difficulty in walking, not elsewhere classified: Secondary | ICD-10-CM | POA: Insufficient documentation

## 2022-11-17 NOTE — Therapy (Signed)
OUTPATIENT PHYSICAL THERAPY TREATMENT  Patient Name: Wanda Gonzales MRN: [id number] DOB:April 26, 1956, 67 y.o., female Today's Date: 11/17/2022    END OF SESSION:  PT End of Session - 11/17/22 1410     Visit Number 24    Date for PT Re-Evaluation 12/05/22    Authorization Type BCBS Medicare    PT Start Time 1359    PT Stop Time 1440    PT Time Calculation (min) 41 min    Behavior During Therapy WFL for tasks assessed/performed                Past Medical History:  Diagnosis Date   Abnormal Pap smear, atypical squamous cells of undetermined sign (ASC-US) 1998   treated with cryo, CIN I   ADD (attention deficit disorder) 02-23-12   easily distracted.   Arthritis 02-23-12   Rt. hip and lt. arm   Closed displaced fracture of fifth metatarsal bone of left foot 03/31/2017   Depression with anxiety    History of osteoporosis    Kidney calculi 07/2015   Kidney stone    PONV (postoperative nausea and vomiting) 02-23-12   always has PONV   Post-nasal drainage 02-23-12   frequent a problem,causes a cough and mucus buildup in throat   Urethral polyp 1998   Dr. Hartley Barefoot   Varicose veins    Venous insufficiency    chronic-denies any problems   Past Surgical History:  Procedure Laterality Date   blood clot removed Left 01/2012   blood clot removed from left hand    BREAST BIOPSY Right 02/14/2012   Fibroadenoma   COLONOSCOPY  08/2003   repeat 5 yrs.   COLONOSCOPY W/ BIOPSIES  12/2008   1 polyp recheck 5 yrs   EXCISION/RELEASE BURSA HIP  02/29/2012   HAND SURGERY  02/23/2012   "blood clot" excised palm left hand 01-18-12   KNEE ARTHROSCOPY W/ MENISCAL REPAIR  10/2012   KNEE ARTHROSCOPY WITH EXCISION BAKER'S CYST Left 10/2012   meniscal repair   ORIF METATARSAL FRACTURE Left 04/07/2017   Left 5th Metatarsal   SHOULDER SURGERY Right 02/2015   Dr. 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   Patient Active  Problem List   Diagnosis Date Noted   IBS (irritable bowel syndrome with diarrhea) 08/17/2022   Hearing aid worn 02/10/2022   Trochanteric bursitis of left hip 02/06/2022   Frequent falls 11/26/2020   Depression with anxiety    History of osteoporosis    Age-related osteoporosis without current pathological fracture 07/29/2020   Osteoporosis 07/27/2020   Rhinitis 03/18/2020   Pain in left knee 09/07/2018   Seborrheic keratosis 11/23/2017   S/P TKR (total knee replacement), left 08/25/2016   Benign paroxysmal positional vertigo 08/25/2016   Mid back pain, chronic 12/16/2015   Cervical strain 11/21/2014   DDD (degenerative disc disease), lumbar 02/17/2014   Vitamin D deficiency 03/18/2013   Trochanteric bursitis of right hip 02/29/2012   INSOMNIA 04/13/2010   Hyperlipidemia 09/17/2007   CERVICAL POLYP 09/17/2007   Attention deficit disorder 09/11/2007   UNSPECIFIED VENOUS INSUFFICIENCY 08/29/2007    PCP: Beatrice Lecher  REFERRING PROVIDER: Lemont Fillers DIAG: Z47.1: Aftercare following joint replacement surgery; [phone] presence of left artificial knee joint  THERAPY DIAG:  Chronic pain of left knee  Muscle weakness (generalized)  Difficulty in walking, not elsewhere classified  Rationale for Evaluation and Treatment: Rehabilitation  ONSET DATE: ~4  years ago  SUBJECTIVE:   SUBJECTIVE STATEMENT:  Patient reports her Lt knee continues to be painful.  She returns to dr next Friday.    PERTINENT HISTORY: L TKA 4 years ago, L hip surgery, hx of L baker's cyst (removed) From eval: Pt reports L knee pain. Pt had L TKA 4 years ago. Pt had scar tissue removed May 24, 2022 but still getting pain. Pt has been going to pool and doing exercises. Pt has been walking. Has been using voltaren on her knee.   PAIN:  Are you having pain? Yes: NPRS scale: 5/10 Pain location: Lt knee at joint line (medial/lateral) Pain description: Can be sharp, achy and  dull Aggravating factors: Sit to stand, stairs, and getting out of bed; fatigue Relieving factors: Water therapy  PRECAUTIONS: None  WEIGHT BEARING RESTRICTIONS: No  FALLS:  Has patient fallen in last 6 months? Yes. Number of falls 5 or 6 times (normally to the left), last fall in October  OCCUPATION: Teaches art 2 days/wk  PATIENT GOALS: Improve knee pain with mobility  NEXT MD VISIT: n/a  OBJECTIVE:  From eval unless otherwise noted  PALPATION: TTP L anterior gastroc, patellar tendon, medial and lateral knee joint line  LOWER EXTREMITY ROM:  Active ROM Right eval Left eval Right 1/10 Left  1/10 Right  11/04/22 Left 11/04/22  Knee flexion 120 115 130 122 140 125  Knee extension 0 0       (Blank rows = not tested)  LOWER EXTREMITY MMT:  MMT Right eval Left eval Right 1/10 Left 1/10 Right 11/04/22 Left 11/04/22  Hip flexion 5 3+ 4 4- 4- 4-  Hip extension 3 3 3+ 4- 4- 4-  Hip abduction 4- 3 4+ 4- 4 4  Hip adduction 5 4-  4+    Hip internal rotation        Hip external rotation        Knee flexion 5 5      Knee extension 5 5      Ankle dorsiflexion 5 5      Ankle plantarflexion 5 5      Ankle inversion 5 4      Ankle eversion 5 5       (Blank rows = not tested)  LOWER EXTREMITY SPECIAL TESTS:  Knee special tests: Patellafemoral apprehension test: positive   FUNCTIONAL TESTS:  SLS on R 4 sec, on L attempts but unable to hold R LE back tandem 30 sec, L LE back tandem 30 sec Painful sit to stand and squatting with increased L knee valgus noted Unable to perform stairs with reciprocal pattern -- L knee valgus noted with ascent and descent  GAIT: Distance walked: 150 Assistive device utilized: None Level of assistance: Complete Independence Comments: antalgic, decreased stance on L, trendelenburg pattern   [facility] Adult PT Treatment:                                                DATE: 11/17/22 Pt seen for aquatic therapy today.  Treatment took place in  water 3.25-4.5 ft in depth at the [place]. Temp of water was 91.  Pt entered/exited the pool via stairs with step-to with bilat rail.  * warm up forward/ backward walking and side stepping - multiple laps * side stepping with yellow hand floats, shoulder addct *  walking forward/ backward with single yellow hand float at side  * holding wall:  single leg clams x 10 each  * Lt lateral step ups with rail, x 5 (painful, stopped) * runner's step up/down x 10 each * L forward step ups x 10, eccentric lowering * SLS with leg swings in sagittal and frontal plane, UE support on yellow hand floats 2 x 10 each * SLS with solid noodle press/stomp with back on wall x 10 fast/ 10 slow each LE * TrA set with solid noodle pull down * straddling yellow noodle and pedaling legs with breast stroke arms  Pt requires the buoyancy and hydrostatic pressure of water for support, and to offload joints by unweighting joint load by at least 50 % in navel deep water and by at least 75-80% in chest to neck deep water.  Viscosity of the water is needed for resistance of strengthening. Water current perturbations provides challenge to standing balance requiring increased core activation.    [facility] Adult PT Treatment:                                                DATE: 11/16/2022 Therapeutic Exercise: Gastroc stretch on 4" step + IR Runner's lunge stretch + forefoot propped on yaga mat roll Fwd amb focuing on proper gait mechanics Manual Therapy: Popliteal MWM IASTM/STM popliteal, medial/lateral gastroc soleus   [facility] Adult PT Treatment:                                                DATE: 11/09/2022 Therapeutic Exercise: Supine: Hip abd isometric w/belt 10x5" Bridges + hip abd isometric 2x0 Small range core marching (foot float + TA activation) Bent knee fall out (pre-pilates) L SLR small ROM, focus on TA activation & pelvic stability x10 S/L straight leg hip abd x10 --> YTB above knees x10 -->  YTB + slight hip ext 3x5 Single leg stretch x10 Double leg stretch 2x5 STS + gait belt above knees (isometric hip abd) 2x10 Modalities: Game ready L knee, 34 deg, med compression x10 min     [facility] Adult PT Treatment:                                                DATE: 10/14/22 Pt seen for aquatic therapy today.  Treatment took place in water 3.25-4.5 ft in depth at the [place]. Temp of water was 91.  Pt entered/exited the pool via stairs with step-to with bilat rail.  * warm up forward/ backward walking and side stepping - multiple laps * L forward step ups x 10, eccentric lowering * SLS with solid noodle press/stomp with intermittent UE on wall x 10 each LE, then hip abdct/knee flexion x 10 with LLE (challenge) * holding noodle:  curtsy lunges (painful in Lt knee after a few reps-stopped); forward walking lunges - cues to correct valgus in Lt knee with flexion  * Holding noodle:  1/2 diamonds (single leg clams) ; heel raises x 10 * Lt quad stretch with ankle supported by solid noodle * foot on 2nd step -  Lt  hamstring stretch x 45 sec   Pt requires the buoyancy and hydrostatic pressure of water for support, and to offload joints by unweighting joint load by at least 50 % in navel deep water and by at least 75-80% in chest to neck deep water.  Viscosity of the water is needed for resistance of strengthening. Water current perturbations provides challenge to standing balance requiring increased core activation.   PATIENT EDUCATION:  Education details:aquatic progression and HEP exercises Person educated: Patient Education method: Explanation, Demonstration, and Handouts Education comprehension: verbalized understanding, returned demonstration, and needs further education  HOME EXERCISE PROGRAM: Access Code: [email or web address] URL: [email or web address] Date: 11/07/2022 Prepared by: Helane Gunther  Exercises - Hip Abduction with Resistance Loop  - 1 x daily - 7 x  weekly - 2 sets - 10 reps - Hip Extension with Resistance Loop  - 1 x daily - 7 x weekly - 2 sets - 10 reps - Seated Piriformis Stretch  - 1 x daily - 7 x weekly - 3 sets - 3 reps - 30 sec hold - Sit to Stand with Armchair  - 1 x daily - 7 x weekly - 3 sets - 10 reps - Tandem Stance with Chair Support  - 1 x daily - 7 x weekly - 1 sets - 3 reps - 30 sec hold - Forward Step Down Touch with Heel  - 1 x daily - 7 x weekly - 2 sets - 10 reps - Glute Med Wall Lean  - 1 x daily - 7 x weekly - 3 sets - 10 reps - Supine Dead Bug with Leg Extension  - 1 x daily - 7 x weekly - 3 sets - 10 reps - Single Leg Stretch  - 1 x daily - 7 x weekly - 3 sets - 10 reps - Beginner Double Leg Stretch  - 1 x daily - 7 x weekly - 3 sets - 10 reps - Sidelying Hip Abduction  - 1 x daily - 7 x weekly - 3 sets - 10 reps  ASSESSMENT:  CLINICAL IMPRESSION: Pt reported increased Lt knee pain with lateral step ups and with hip addct crossing midline; modified exercises for improved tolerance. Pain varied throughout session, depending on motion of Lt knee.   Issued laminated aquatic HEP for pt to continue working independently in the water. Pt has partially met her goals and is making gradual progress towards remaining goals.    OBJECTIVE IMPAIRMENTS: Abnormal gait, decreased activity tolerance, decreased balance, decreased endurance, decreased mobility, difficulty walking, decreased ROM, decreased strength, increased fascial restrictions, increased muscle spasms, impaired sensation, improper body mechanics, and pain.    GOALS: Goals reviewed with patient? Yes  SHORT TERM GOALS: Target date: 09/21/2022  Pt will be ind with initial HEP Baseline: Goal status: IN PROGRESS 1/3: MET   LONG TERM GOALS: Target date: 12/05/2022  Pt will be ind with advancement and progression of HEP Baseline:  Goal status: MET 1/10   2.  Pt will report improved pain by at least 50% with stair ascent/descent Baseline:  Goal status: 1/10:  PROGRESSING (L>R descend) Goal status: MOSTLY MET (30% decrease in pain) 0n 11/04/22  3.  Pt will be able to perform 5x STS without UE support to demo improved functional LE strength Baseline:  Goal status: IN PROGRESS 10/20/22 mild pain on last few reps   4.  Pt will demo improved body mechanics with less knee valgus during lifting Baseline:  Goal status: MET 1/10  5.  Pt will have improved FOTO score to >/= 67 Baseline:  Goal status: IN PROGRESS (49 - regression from 55) 11/04/22   PLAN:  PT FREQUENCY: 2x/week  PT DURATION: 8 weeks  PLANNED INTERVENTIONS: Therapeutic exercises, Therapeutic activity, Neuromuscular re-education, Balance training, Gait training, Patient/Family education, Self Care, Joint mobilization, Stair training, Orthotic/Fit training, Aquatic Therapy, Dry Needling, Electrical stimulation, Cryotherapy, Moist heat, Taping, Vasopneumatic device, Ultrasound, Ionotophoresis '4mg'$ /ml Dexamethasone, Manual therapy, and Re-evaluation  PLAN FOR NEXT SESSION: Assess knee pain & alignment/mechanics. Continue hip, quad, glute, core strengthening (Ortho appt on 3/8)  Kerin Perna, PTA 11/17/22 6:28 PM [facility] [address] [place], [place], [address] Phone: [phone]   Fax:  [phone]

## 2022-11-23 ENCOUNTER — Ambulatory Visit: Payer: Medicare Other

## 2022-11-24 ENCOUNTER — Ambulatory Visit (HOSPITAL_BASED_OUTPATIENT_CLINIC_OR_DEPARTMENT_OTHER): Payer: BC Managed Care – PPO | Admitting: Physical Therapy

## 2022-11-25 ENCOUNTER — Ambulatory Visit: Payer: Medicare Other | Attending: Orthopedic Surgery

## 2022-11-25 DIAGNOSIS — R296 Repeated falls: Secondary | ICD-10-CM | POA: Diagnosis not present

## 2022-11-25 DIAGNOSIS — M25562 Pain in left knee: Secondary | ICD-10-CM | POA: Diagnosis not present

## 2022-11-25 DIAGNOSIS — R262 Difficulty in walking, not elsewhere classified: Secondary | ICD-10-CM | POA: Insufficient documentation

## 2022-11-25 DIAGNOSIS — G8929 Other chronic pain: Secondary | ICD-10-CM | POA: Insufficient documentation

## 2022-11-25 DIAGNOSIS — M6281 Muscle weakness (generalized): Secondary | ICD-10-CM | POA: Insufficient documentation

## 2022-11-25 DIAGNOSIS — M545 Low back pain, unspecified: Secondary | ICD-10-CM | POA: Diagnosis not present

## 2022-11-25 DIAGNOSIS — Z96652 Presence of left artificial knee joint: Secondary | ICD-10-CM | POA: Diagnosis not present

## 2022-11-25 NOTE — Therapy (Signed)
OUTPATIENT PHYSICAL THERAPY TREATMENT  Patient Name: Wanda Gonzales MRN: [id number] DOB:May 16, 1956, 67 y.o., female Today's Date: 11/25/2022    END OF SESSION:  PT End of Session - 11/25/22 0801     Visit Number 25    Date for PT Re-Evaluation 12/05/22    Authorization Type BCBS Medicare    PT Start Time 0802    PT Stop Time 0845    PT Time Calculation (min) 43 min    Activity Tolerance Patient tolerated treatment well    Behavior During Therapy WFL for tasks assessed/performed                Past Medical History:  Diagnosis Date   Abnormal Pap smear, atypical squamous cells of undetermined sign (ASC-US) 1998   treated with cryo, CIN I   ADD (attention deficit disorder) 02-23-12   easily distracted.   Arthritis 02-23-12   Rt. hip and lt. arm   Closed displaced fracture of fifth metatarsal bone of left foot 03/31/2017   Depression with anxiety    History of osteoporosis    Kidney calculi 07/2015   Kidney stone    PONV (postoperative nausea and vomiting) 02-23-12   always has PONV   Post-nasal drainage 02-23-12   frequent a problem,causes a cough and mucus buildup in throat   Urethral polyp 1998   Dr. Hartley Barefoot   Varicose veins    Venous insufficiency    chronic-denies any problems   Past Surgical History:  Procedure Laterality Date   blood clot removed Left 01/2012   blood clot removed from left hand    BREAST BIOPSY Right 02/14/2012   Fibroadenoma   COLONOSCOPY  08/2003   repeat 5 yrs.   COLONOSCOPY W/ BIOPSIES  12/2008   1 polyp recheck 5 yrs   EXCISION/RELEASE BURSA HIP  02/29/2012   HAND SURGERY  02/23/2012   "blood clot" excised palm left hand 01-18-12   KNEE ARTHROSCOPY W/ MENISCAL REPAIR  10/2012   KNEE ARTHROSCOPY WITH EXCISION BAKER'S CYST Left 10/2012   meniscal repair   ORIF METATARSAL FRACTURE Left 04/07/2017   Left 5th Metatarsal   SHOULDER SURGERY Right 02/2015   Dr. Lennette Bihari Supple - [facility]   TOTAL HIP ARTHROPLASTY  age 48   LT, congenital  dislocation that caused advanced arthritis   Patient Active Problem List   Diagnosis Date Noted   IBS (irritable bowel syndrome with diarrhea) 08/17/2022   Hearing aid worn 02/10/2022   Trochanteric bursitis of left hip 02/06/2022   Frequent falls 11/26/2020   Depression with anxiety    History of osteoporosis    Age-related osteoporosis without current pathological fracture 07/29/2020   Osteoporosis 07/27/2020   Rhinitis 03/18/2020   Pain in left knee 09/07/2018   Seborrheic keratosis 11/23/2017   S/P TKR (total knee replacement), left 08/25/2016   Benign paroxysmal positional vertigo 08/25/2016   Mid back pain, chronic 12/16/2015   Cervical strain 11/21/2014   DDD (degenerative disc disease), lumbar 02/17/2014   Vitamin D deficiency 03/18/2013   Trochanteric bursitis of right hip 02/29/2012   INSOMNIA 04/13/2010   Hyperlipidemia 09/17/2007   CERVICAL POLYP 09/17/2007   Attention deficit disorder 09/11/2007   UNSPECIFIED VENOUS INSUFFICIENCY 08/29/2007    PCP: Beatrice Lecher  REFERRING PROVIDER: Lemont Fillers DIAG: Z47.1: Aftercare following joint replacement surgery; [phone] presence of left artificial knee joint  THERAPY DIAG:  Chronic pain of left knee  Difficulty in walking, not elsewhere classified  Muscle weakness (generalized)  Repeated  falls  Chronic bilateral low back pain without sciatica  Rationale for Evaluation and Treatment: Rehabilitation  ONSET DATE: ~4 years ago  SUBJECTIVE:   SUBJECTIVE STATEMENT:  [place] [employer] [place] and had to climb a lot stairs and towards the end of the game she needed help climbing stairs to top, states she is very tired in general but no significant increase in knee pain.  PERTINENT HISTORY: L TKA 4 years ago, L hip surgery, hx of L baker's cyst (removed) From eval: Pt reports L knee pain. Pt had L TKA 4 years ago. Pt had scar tissue removed May 24, 2022  but still getting pain. Pt has been going to pool and doing exercises. Pt has been walking. Has been using voltaren on her knee.   PAIN:  Are you having pain? Yes: NPRS scale: 5/10 Pain location: Lt knee at joint line (medial/lateral) Pain description: Can be sharp, achy and dull Aggravating factors: Sit to stand, stairs, and getting out of bed; fatigue Relieving factors: Water therapy  PRECAUTIONS: None  WEIGHT BEARING RESTRICTIONS: No  FALLS:  Has patient fallen in last 6 months? Yes. Number of falls 5 or 6 times (normally to the left), last fall in October  OCCUPATION: Teaches art 2 days/wk  PATIENT GOALS: Improve knee pain with mobility  NEXT MD VISIT: n/a  OBJECTIVE:  From eval unless otherwise noted  PALPATION: TTP L anterior gastroc, patellar tendon, medial and lateral knee joint line  LOWER EXTREMITY ROM:  Active ROM Right eval Left eval Right 1/10 Left  1/10 Right  11/04/22 Left 11/04/22  Knee flexion 120 115 130 122 140 125  Knee extension 0 0       (Blank rows = not tested)  LOWER EXTREMITY MMT:  MMT Right eval Left eval Right 1/10 Left 1/10 Right 11/04/22 Left 11/04/22  Hip flexion 5 3+ 4 4- 4- 4-  Hip extension 3 3 3+ 4- 4- 4-  Hip abduction 4- 3 4+ 4- 4 4  Hip adduction 5 4-  4+    Hip internal rotation        Hip external rotation        Knee flexion 5 5      Knee extension 5 5      Ankle dorsiflexion 5 5      Ankle plantarflexion 5 5      Ankle inversion 5 4      Ankle eversion 5 5       (Blank rows = not tested)  LOWER EXTREMITY SPECIAL TESTS:  Knee special tests: Patellafemoral apprehension test: positive   FUNCTIONAL TESTS:  SLS on R 4 sec, on L attempts but unable to hold R LE back tandem 30 sec, L LE back tandem 30 sec Painful sit to stand and squatting with increased L knee valgus noted Unable to perform stairs with reciprocal pattern -- L knee valgus noted with ascent and descent  GAIT: Distance walked: 150 Assistive device  utilized: None Level of assistance: Complete Independence Comments: antalgic, decreased stance on L, trendelenburg pattern    [facility] Adult PT Treatment:                                                DATE: 11/25/2022 Therapeutic Exercise: NuStep L5 x 6 min + FOTO intake Slantboard gastroc  stretch 3x30" Soleus stretch (bent knee stride stance, forefoot on noodle) 3x30" Heel raises + tennis ball b/w ankles 2x10 Isometric heel raises at wall L SLS heel raises Manual Therapy: STM/IASTM popliteal, medial/lateral gastroc, soleus    [facility] Adult PT Treatment:                                                DATE: 11/17/22 Pt seen for aquatic therapy today.  Treatment took place in water 3.25-4.5 ft in depth at the [place]. Temp of water was 91.  Pt entered/exited the pool via stairs with step-to with bilat rail.  * warm up forward/ backward walking and side stepping - multiple laps * side stepping with yellow hand floats, shoulder addct * walking forward/ backward with single yellow hand float at side  * holding wall:  single leg clams x 10 each  * Lt lateral step ups with rail, x 5 (painful, stopped) * runner's step up/down x 10 each * L forward step ups x 10, eccentric lowering * SLS with leg swings in sagittal and frontal plane, UE support on yellow hand floats 2 x 10 each * SLS with solid noodle press/stomp with back on wall x 10 fast/ 10 slow each LE * TrA set with solid noodle pull down * straddling yellow noodle and pedaling legs with breast stroke arms  Pt requires the buoyancy and hydrostatic pressure of water for support, and to offload joints by unweighting joint load by at least 50 % in navel deep water and by at least 75-80% in chest to neck deep water.  Viscosity of the water is needed for resistance of strengthening. Water current perturbations provides challenge to standing balance requiring increased core activation.    [facility] Adult PT Treatment:                                                 DATE: 11/16/2022 Therapeutic Exercise: Gastroc stretch on 4" step + IR Runner's lunge stretch + forefoot propped on yoga mat roll Fwd amb focuing on proper gait mechanics Manual Therapy: Popliteal MWM IASTM/STM popliteal, medial/lateral gastroc soleus   PATIENT EDUCATION:  Education details:aquatic progression and HEP exercises Person educated: Patient Education method: Explanation, Demonstration, and Handouts Education comprehension: verbalized understanding, returned demonstration, and needs further education  HOME EXERCISE PROGRAM: Access Code: [email or web address] URL: [email or web address] Date: 11/07/2022 Prepared by: Helane Gunther  Exercises - Hip Abduction with Resistance Loop  - 1 x daily - 7 x weekly - 2 sets - 10 reps - Hip Extension with Resistance Loop  - 1 x daily - 7 x weekly - 2 sets - 10 reps - Seated Piriformis Stretch  - 1 x daily - 7 x weekly - 3 sets - 3 reps - 30 sec hold - Sit to Stand with Armchair  - 1 x daily - 7 x weekly - 3 sets - 10 reps - Tandem Stance with Chair Support  - 1 x daily - 7 x weekly - 1 sets - 3 reps - 30 sec hold - Forward Step Down Touch with Heel  - 1 x daily - 7 x weekly - 2 sets - 10 reps - [place]  -  1 x daily - 7 x weekly - 3 sets - 10 reps - Supine Dead Bug with Leg Extension  - 1 x daily - 7 x weekly - 3 sets - 10 reps - Single Leg Stretch  - 1 x daily - 7 x weekly - 3 sets - 10 reps - Beginner Double Leg Stretch  - 1 x daily - 7 x weekly - 3 sets - 10 reps - Sidelying Hip Abduction  - 1 x daily - 7 x weekly - 3 sets - 10 reps  ASSESSMENT:  CLINICAL IMPRESSION:  Noted myofascial tightness and tenderness with palpitation along medial/lateral gastroc and soleus on L LE. Exercises focused on gastroc strengthening; isometric heel raises performed at wall to decrease forward lean compensation. Patient reported mild pain in L anterior knee after standing exercises.   OBJECTIVE  IMPAIRMENTS: Abnormal gait, decreased activity tolerance, decreased balance, decreased endurance, decreased mobility, difficulty walking, decreased ROM, decreased strength, increased fascial restrictions, increased muscle spasms, impaired sensation, improper body mechanics, and pain.    GOALS: Goals reviewed with patient? Yes  SHORT TERM GOALS: Target date: 09/21/2022  Pt will be ind with initial HEP Baseline: Goal status: IN PROGRESS 1/3: MET   LONG TERM GOALS: Target date: 12/05/2022  Pt will be ind with advancement and progression of HEP Baseline:  Goal status: MET 1/10   2.  Pt will report improved pain by at least 50% with stair ascent/descent Baseline:  Goal status: 1/10: PROGRESSING (L>R descend) Goal status: MOSTLY MET (30% decrease in pain) 0n 11/04/22  3.  Pt will be able to perform 5x STS without UE support to demo improved functional LE strength Baseline:  Goal status: IN PROGRESS 10/20/22 mild pain on last few reps   4.  Pt will demo improved body mechanics with less knee valgus during lifting Baseline:  Goal status: MET 1/10  5.  Pt will have improved FOTO score to >/= 67 Baseline:  Goal status: IN PROGRESS (53%) 11/25/22   PLAN:  PT FREQUENCY: 2x/week  PT DURATION: 8 weeks  PLANNED INTERVENTIONS: Therapeutic exercises, Therapeutic activity, Neuromuscular re-education, Balance training, Gait training, Patient/Family education, Self Care, Joint mobilization, Stair training, Orthotic/Fit training, Aquatic Therapy, Dry Needling, Electrical stimulation, Cryotherapy, Moist heat, Taping, Vasopneumatic device, Ultrasound, Ionotophoresis '4mg'$ /ml Dexamethasone, Manual therapy, and Re-evaluation  PLAN FOR NEXT SESSION: Follow up on ortho visit (3/8)  Helane Gunther, PTA 11/25/2022 8:42AM

## 2022-11-30 DIAGNOSIS — H524 Presbyopia: Secondary | ICD-10-CM | POA: Diagnosis not present

## 2022-12-13 DIAGNOSIS — K080 Exfoliation of teeth due to systemic causes: Secondary | ICD-10-CM | POA: Diagnosis not present

## 2023-02-06 ENCOUNTER — Other Ambulatory Visit (HOSPITAL_COMMUNITY): Payer: Self-pay | Admitting: Orthopedic Surgery

## 2023-02-06 DIAGNOSIS — Z96652 Presence of left artificial knee joint: Secondary | ICD-10-CM

## 2023-02-14 ENCOUNTER — Other Ambulatory Visit: Payer: Self-pay | Admitting: Family Medicine

## 2023-02-14 DIAGNOSIS — F418 Other specified anxiety disorders: Secondary | ICD-10-CM

## 2023-02-15 ENCOUNTER — Ambulatory Visit (HOSPITAL_COMMUNITY): Admission: RE | Admit: 2023-02-15 | Payer: Medicare Other | Source: Ambulatory Visit

## 2023-02-15 ENCOUNTER — Encounter (HOSPITAL_COMMUNITY): Payer: Medicare Other

## 2023-02-15 ENCOUNTER — Ambulatory Visit (HOSPITAL_COMMUNITY): Payer: Medicare Other

## 2023-02-15 ENCOUNTER — Encounter (HOSPITAL_COMMUNITY): Payer: Self-pay

## 2023-02-16 ENCOUNTER — Ambulatory Visit (INDEPENDENT_AMBULATORY_CARE_PROVIDER_SITE_OTHER): Payer: Medicare Other | Admitting: Family Medicine

## 2023-02-16 ENCOUNTER — Encounter: Payer: Self-pay | Admitting: Family Medicine

## 2023-02-16 VITALS — BP 101/64 | HR 75 | Ht 61.0 in

## 2023-02-16 DIAGNOSIS — R519 Headache, unspecified: Secondary | ICD-10-CM | POA: Diagnosis not present

## 2023-02-16 DIAGNOSIS — F418 Other specified anxiety disorders: Secondary | ICD-10-CM

## 2023-02-16 NOTE — Progress Notes (Signed)
Established Patient Office Visit  Subjective   Patient ID: Wanda Gonzales, female    DOB: 1956-09-10  Age: 67 y.o. MRN: [id number]  Chief Complaint  Patient presents with   mood   Headache    L sided x 3 months she sometimes reports waking up with the headache or it would come on late in the afternoon. These sometimes would last all day.   She denies any visual changes,nausea or vomiting. She takes either Tylenol or Advil for relief and has also used ice packs, increased water intake and laid down for naps.  She is concerned because her father passed of Brain cancer.     HPI   L sided x 3 months she sometimes reports waking up with the headache or it would come on late in the afternoon. These sometimes would last all day.  Throbs at times.  She denies any visual changes,nausea or vomiting. She takes either Tylenol or Advil for relief and has also used ice packs, increased water intake and laid down for naps. She is concerned because her father passed of Brain cancer.       ROS    Objective:     BP 101/64   Pulse 75   Ht 5\' 1"  (1.549 m)   LMP 12/18/2013   SpO2 96%   BMI 27.21 kg/m    Physical Exam Vitals and nursing note reviewed.  Constitutional:      Appearance: She is well-developed.  HENT:     Head: Normocephalic and atraumatic.     Right Ear: External ear normal.     Left Ear: External ear normal.     Nose: Nose normal.  Eyes:     Conjunctiva/sclera: Conjunctivae normal.     Pupils: Pupils are equal, round, and reactive to light.  Neck:     Thyroid: No thyromegaly.  Cardiovascular:     Rate and Rhythm: Normal rate and regular rhythm.     Heart sounds: Normal heart sounds.  Pulmonary:     Effort: Pulmonary effort is normal.     Breath sounds: Normal breath sounds. No wheezing.  Musculoskeletal:     Cervical back: Neck supple.  Lymphadenopathy:     Cervical: No cervical adenopathy.  Skin:    General: Skin is warm and dry.  Neurological:     General: No  focal deficit present.     Mental Status: She is alert and oriented to person, place, and time. Mental status is at baseline.     Cranial Nerves: No cranial nerve deficit.  Psychiatric:        Mood and Affect: Mood normal.        Behavior: Behavior normal.      No results found for any visits on 02/16/23.    The 10-year ASCVD risk score (Arnett DK, et al., 2019) is: 4.4%    Assessment & Plan:   Problem List Items Addressed This Visit       Other   Depression with anxiety    Well on current regimen.  Plan to follow-up in 6 months.  She was able to get refills.  Call if any concerns or problems.  Continue with group coaching sessions.       Other Visit Diagnoses     Left-sided headache    -  Primary   Relevant Orders   MR Brain W Wo Contrast      Left-sided headache cyst and now its daily.  We discussed options including giving  it another week since she did feel like this past week it was a little bit better.  Or just moving forward with a brain MRI for further evaluation she is quite nervous about her family history with her dad having a brain tumor and dying in his 22s and so she would feel more comfortable going ahead and moving forward with imaging.  Return in about 6 months (around 08/19/2023) for [facility] or sooner for headaches. Nani Gasser, MD

## 2023-02-16 NOTE — Assessment & Plan Note (Signed)
Well on current regimen.  Plan to follow-up in 6 months.  She was able to get refills.  Call if any concerns or problems.  Continue with group coaching sessions. 

## 2023-02-20 ENCOUNTER — Encounter: Payer: Self-pay | Admitting: Family Medicine

## 2023-02-20 ENCOUNTER — Other Ambulatory Visit: Payer: Self-pay | Admitting: *Deleted

## 2023-02-20 ENCOUNTER — Ambulatory Visit (INDEPENDENT_AMBULATORY_CARE_PROVIDER_SITE_OTHER): Payer: Medicare Other | Admitting: Family Medicine

## 2023-02-20 VITALS — BP 86/59 | HR 88 | Ht 61.0 in

## 2023-02-20 DIAGNOSIS — L249 Irritant contact dermatitis, unspecified cause: Secondary | ICD-10-CM | POA: Diagnosis not present

## 2023-02-20 DIAGNOSIS — E785 Hyperlipidemia, unspecified: Secondary | ICD-10-CM

## 2023-02-20 MED ORDER — TRIAMCINOLONE ACETONIDE 0.1 % EX CREA: 1.0000 | TOPICAL_CREAM | Freq: Two times a day (BID) | CUTANEOUS | 0 refills | Status: DC

## 2023-02-20 MED ORDER — PREDNISONE 10 MG (48) PO TBPK
ORAL_TABLET | ORAL | 0 refills | Status: DC
Start: 2023-02-20 — End: 2023-07-12

## 2023-02-20 MED ORDER — HYDROXYZINE PAMOATE 25 MG PO CAPS: 25.0000 mg | ORAL_CAPSULE | Freq: Three times a day (TID) | ORAL | 0 refills | Status: DC | PRN

## 2023-02-20 MED ORDER — HYDROXYZINE HCL 25 MG PO TABS: 25.0000 mg | ORAL_TABLET | Freq: Three times a day (TID) | ORAL | 0 refills | Status: DC | PRN

## 2023-02-20 NOTE — Progress Notes (Signed)
Wanda Gonzales - 67 y.o. female MRN [id number]  Date of birth: 04-25-1956  Subjective Chief Complaint  Patient presents with   Rash    HPI Wanda Gonzales is a 67 year old female here today with complaint of rash.  Rash located between the upper thighs as well as the left hip. Lower abdomen and the left arm.  See does not recall any recent changes including new medications, changes to soaps, detergents or exposure to any other known irritants.  Rash is itchy with some blistering.  She denies any significant pain with this.  No fever or chills.  She did try over-the-counter hydrocortisone without much improvement.  ROS:  A comprehensive ROS was completed and negative except as noted per HPI  Allergies  Allergen Reactions   Codeine Other (See Comments)    REACTION: nausea and dizzy   Doxycycline Nausea Only   Concerta [Methylphenidate] Hives and Rash   Sulfa Antibiotics Nausea Only    Past Medical History:  Diagnosis Date   Abnormal Pap smear, atypical squamous cells of undetermined sign (ASC-US) 1998   treated with cryo, CIN I   ADD (attention deficit disorder) 02-23-12   easily distracted.   Arthritis 02-23-12   Rt. hip and lt. arm   Closed displaced fracture of fifth metatarsal bone of left foot 03/31/2017   Depression with anxiety    History of osteoporosis    Kidney calculi 07/2015   Kidney stone    PONV (postoperative nausea and vomiting) 02-23-12   always has PONV   Post-nasal drainage 02-23-12   frequent a problem,causes a cough and mucus buildup in throat   Urethral polyp 1998   Dr. Marcello Fennel   Varicose veins    Venous insufficiency    chronic-denies any problems    Past Surgical History:  Procedure Laterality Date   blood clot removed Left 01/2012   blood clot removed from left hand    BREAST BIOPSY Right 02/14/2012   Fibroadenoma   COLONOSCOPY  08/2003   repeat 5 yrs.   COLONOSCOPY W/ BIOPSIES  12/2008   1 polyp recheck 5 yrs   EXCISION/RELEASE BURSA HIP   02/29/2012   HAND SURGERY  02/23/2012   "blood clot" excised palm left hand 01-18-12   KNEE ARTHROSCOPY W/ MENISCAL REPAIR  10/2012   KNEE ARTHROSCOPY WITH EXCISION BAKER'S CYST Left 10/2012   meniscal repair   ORIF METATARSAL FRACTURE Left 04/07/2017   Left 5th Metatarsal   SHOULDER SURGERY Right 02/2015   Dr. 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   Social Determinants of Health   Financial Resource Strain: Low Risk  (02/15/2023)   Overall Financial Resource Strain (CARDIA)    Difficulty of Paying Living Expenses: Not hard at all  Food Insecurity: No Food Insecurity (02/15/2023)   Hunger Vital Sign    Worried About Running Out of Food in the Last Year: Never true    Ran Out of Food in the Last Year: Never true  Transportation Needs: No Transportation Needs (02/15/2023)   PRAPARE - Administrator, Civil Service (Medical): No    Lack of Transportation (Non-Medical): No  Physical Activity: Insufficiently Active (02/15/2023)   Exercise Vital Sign    Days of Exercise per Week: 2 days    Minutes of Exercise per Session: 30 min  Stress: No Stress  Concern Present (02/15/2023)   [employer] of Occupational Health - Occupational Stress Questionnaire    Feeling of Stress : Not at all  Social Connections: Moderately Integrated (02/15/2023)   Social Connection and Isolation Panel [NHANES]    Frequency of Communication with Friends and Family: More than three times a week    Frequency of Social Gatherings with Friends and Family: Twice a week    Attends Religious Services: 1 to 4 times per year    Active Member of [employer] or Organizations: No    Attends Engineer, structural: Not on file    Marital Status: Married    Family History  Problem Relation Age of Onset   Breast cancer Mother 69       breast   Hyperlipidemia Mother    Brain cancer Father 30       brain   Osteoporosis Maternal Grandmother    Drug abuse Brother     Health Maintenance  Topic Date Due   COVID-19 Vaccine (7 - 2023-24 season) 09/02/2022   INFLUENZA VACCINE  04/20/2023   Medicare Annual Wellness (AWV)  07/06/2023   MAMMOGRAM  10/12/2024   Colonoscopy  12/25/2024   DTaP/Tdap/Td (4 - Td or Tdap) 09/15/2029   Pneumonia Vaccine 20+ Years old  Completed   DEXA SCAN  Completed   Hepatitis C Screening  Completed   Zoster Vaccines- Shingrix  Completed   HPV VACCINES  Aged Out     ----------------------------------------------------------------------------------------------------------------------------------------------------------------------------------------------------------------- Physical Exam BP (!) 86/59 (BP Location: Right Arm, Patient Position: Sitting, Cuff Size: Normal)   Pulse 88   Ht 5\' 1"  (1.549 m)   LMP 12/18/2013   SpO2 97%   BMI 27.21 kg/m   Physical Exam Constitutional:      Appearance: Normal appearance.  HENT:     Head: Normocephalic and atraumatic.  Skin:    Comments: Erythematous patches between bilateral proximal inner thighs.  She does have some raised area with some blistering and serous drainage.  Similar area on  the left forearm and left lower abdomen.  Neurological:     Mental Status: She is alert.     ------------------------------------------------------------------------------------------------------------------------------------------------------------------------------------------------------------------- Assessment and Plan  Irritant contact dermatitis Area with appearance of contact dermatitis.  Unclear what the causes at this time.  Adding course of prednisone as well as topical triamcinolone.  Hydroxyzine as needed for itching.  She will let me know if she is having worsening of her symptoms.  Red flags and signs of infection discussed.   Meds ordered this encounter  Medications   predniSONE (STERAPRED UNI-PAK 48 TAB) 10 MG (48) TBPK tablet    Sig: Taper as directed  on packaging.  12 day taper    Dispense:  48 tablet    Refill:  0   DISCONTD: hydrOXYzine (VISTARIL) 25 MG capsule    Sig: Take 1 capsule (25 mg total) by mouth every 8 (eight) hours as needed.    Dispense:  30 capsule    Refill:  0   triamcinolone cream (KENALOG) 0.1 %    Sig: Apply 1 Application topically 2 (two) times daily.    Dispense:  30 g    Refill:  0   hydrOXYzine (ATARAX) 25 MG tablet    Sig: Take 1 tablet (25 mg total) by mouth 3 (three) times daily as needed for itching.    Dispense:  30 tablet    Refill:  0    No follow-ups on file.    This visit occurred during the SARS-CoV-2 public health emergency.  Safety protocols were in place, including screening questions prior to the visit, additional usage of staff PPE, and extensive cleaning of exam room while observing appropriate contact time as indicated for disinfecting solutions.

## 2023-02-20 NOTE — Patient Instructions (Signed)
Contact Dermatitis Dermatitis is redness, soreness, and swelling (inflammation) of the skin. Contact dermatitis is a reaction to certain substances that touch the skin. There are two types of this condition: Irritant contact dermatitis. This is the most common type. It happens when something irritates your skin, such as when your hands get dry from washing them too often with soap. You can get this type of reaction even if you have not been exposed to the irritant before. Allergic contact dermatitis. This type is caused by a substance that you are allergic to, such as poison ivy. It occurs when you have been exposed to the substance (allergen) and form a sensitivity to it. In some cases, the reaction may start soon after your first exposure to the allergen. In other cases, it may not start until you are exposed to the allergen again. It may then occur every time you are exposed to the allergen in the future. What are the causes? Irritant contact dermatitis is often caused by exposure to: Makeup. Soaps, detergents, and bleaches. Acids. Metal salts, such as nickel. Allergic contact dermatitis is often caused by exposure to: Poisonous plants. Chemicals. Jewelry. Latex. Medicines. Preservatives in products, such as clothes. What increases the risk? You are more likely to get this condition if you have: A job that exposes you to irritants or allergens. Certain medical conditions. These include asthma and eczema. What are the signs or symptoms? Symptoms of this condition may occur in any place on your body that has been touched by the irritant. Symptoms include: Dryness, flaking, or cracking. Redness. Itching. Pain or a burning feeling. Blisters. Drainage of small amounts of blood or clear fluid from skin cracks. With allergic contact dermatitis, there may also be swelling in areas such as the eyelids, mouth, or genitals. How is this diagnosed? This condition is diagnosed with a medical  history and physical exam. A patch skin test may be done to help figure out the cause. If the condition is related to your job, you may need to see an expert in health problems in the workplace (occupational medicine specialist). How is this treated? This condition is treated by staying away from the cause of the reaction and protecting your skin from further contact. Treatment may also include: Steroid creams or ointments. Steroid medicines may need be taken by mouth (orally) in more severe cases. Antibiotics or medicines applied to the skin to kill bacteria (antibacterial ointments). These may be needed if a skin infection is present. Antihistamines. These may be taken orally or put on as a lotion to ease itching. A bandage (dressing). Follow these instructions at home: Skin care Moisturize your skin as needed. Put cool, wet cloths (cool compresses) on the affected areas. Try applying baking soda paste to your skin. Stir water into baking soda until it has the consistency of a paste. Do not scratch your skin. Avoid friction to the affected area. Avoid the use of soaps, perfumes, and dyes. Check the affected areas every day for signs of infection. Check for: More redness, swelling, or pain. More fluid or blood. Warmth. Pus or a bad smell. Medicines Take or apply over-the-counter and prescription medicines only as told by your health care provider. If you were prescribed antibiotics, take or apply them as told by your health care provider. Do not stop using the antibiotic even if you start to feel better. Bathing Try taking a bath with: Epsom salts. Follow the instructions on the packaging. You can get these at your local pharmacy   or grocery store. Baking soda. Pour a small amount into the bath as told by your health care provider. Colloidal oatmeal. Follow the instructions on the packaging. You can get this at your local pharmacy or grocery store. Bathe less often. This may mean bathing  every other day. Bathe in lukewarm water. Avoid using hot water. Bandage care If you were given a dressing, change it as told by your health care provider. Wash your hands with soap and water for at least 20 seconds before and after you change your dressing. If soap and water are not available, use hand sanitizer. General instructions Avoid the substance that caused your reaction. If you do not know what caused it, keep a journal to try to track what caused it. Write down: What you eat and drink. What cosmetics you use. What you wear in the affected area. This includes jewelry. Contact a health care provider if: Your condition does not get better with treatment. Your condition gets worse. You have any signs of infection. You have a fever. You have new symptoms. Your bone or joint under the affected area becomes painful after the skin has healed. Get help right away if: You notice red streaks coming from the affected area. The affected area turns darker. You have trouble breathing. This information is not intended to replace advice given to you by your health care provider. Make sure you discuss any questions you have with your health care provider. Document Revised: 03/11/2022 Document Reviewed: 03/11/2022 Elsevier Patient Education  2024 Elsevier Inc.  

## 2023-02-20 NOTE — Assessment & Plan Note (Signed)
Area with appearance of contact dermatitis.  Unclear what the causes at this time.  Adding course of prednisone as well as topical triamcinolone.  Hydroxyzine as needed for itching.  She will let me know if she is having worsening of her symptoms.  Red flags and signs of infection discussed.

## 2023-02-23 DIAGNOSIS — L309 Dermatitis, unspecified: Secondary | ICD-10-CM | POA: Diagnosis not present

## 2023-02-23 DIAGNOSIS — L821 Other seborrheic keratosis: Secondary | ICD-10-CM | POA: Diagnosis not present

## 2023-02-23 DIAGNOSIS — E785 Hyperlipidemia, unspecified: Secondary | ICD-10-CM | POA: Diagnosis not present

## 2023-02-23 DIAGNOSIS — Z129 Encounter for screening for malignant neoplasm, site unspecified: Secondary | ICD-10-CM | POA: Diagnosis not present

## 2023-02-23 DIAGNOSIS — L237 Allergic contact dermatitis due to plants, except food: Secondary | ICD-10-CM | POA: Diagnosis not present

## 2023-02-24 ENCOUNTER — Encounter: Payer: Self-pay | Admitting: Family Medicine

## 2023-02-24 DIAGNOSIS — E785 Hyperlipidemia, unspecified: Secondary | ICD-10-CM

## 2023-02-24 LAB — COMPLETE METABOLIC PANEL WITH GFR
AG Ratio: 1.6 (calc) (ref 1.0–2.5)
ALT: 10 U/L (ref 6–29)
AST: 15 U/L (ref 10–35)
Albumin: 4.4 g/dL (ref 3.6–5.1)
Alkaline phosphatase (APISO): 67 U/L (ref 37–153)
BUN/Creatinine Ratio: 32 (calc) — ABNORMAL HIGH (ref 6–22)
BUN: 27 mg/dL — ABNORMAL HIGH (ref 7–25)
CO2: 25 mmol/L (ref 20–32)
Calcium: 9.4 mg/dL (ref 8.6–10.4)
Chloride: 105 mmol/L (ref 98–110)
Creat: 0.85 mg/dL (ref 0.50–1.05)
Globulin: 2.8 g/dL (calc) (ref 1.9–3.7)
Glucose, Bld: 91 mg/dL (ref 65–99)
Potassium: 4.3 mmol/L (ref 3.5–5.3)
Sodium: 141 mmol/L (ref 135–146)
Total Bilirubin: 0.5 mg/dL (ref 0.2–1.2)
Total Protein: 7.2 g/dL (ref 6.1–8.1)
eGFR: 76 mL/min/{1.73_m2} (ref >=60)

## 2023-02-24 LAB — LIPID PANEL W/REFLEX DIRECT LDL
Cholesterol: 325 mg/dL — ABNORMAL HIGH (ref <200)
HDL: 69 mg/dL (ref >=50)
LDL Cholesterol (Calc): 229 mg/dL (calc) — ABNORMAL HIGH
Non-HDL Cholesterol (Calc): 256 mg/dL (calc) — ABNORMAL HIGH (ref <130)
Total CHOL/HDL Ratio: 4.7 (calc) (ref <5.0)
Triglycerides: 124 mg/dL (ref <150)

## 2023-02-24 LAB — CBC
HCT: 42.5 % (ref 35.0–45.0)
Hemoglobin: 13.7 g/dL (ref 11.7–15.5)
MCH: 29.4 pg (ref 27.0–33.0)
MCHC: 32.2 g/dL (ref 32.0–36.0)
MCV: 91.2 fL (ref 80.0–100.0)
MPV: 10.5 fL (ref 7.5–12.5)
Platelets: 342 10*3/uL (ref 140–400)
RBC: 4.66 10*6/uL (ref 3.80–5.10)
RDW: 13 % (ref 11.0–15.0)
WBC: 10.6 10*3/uL (ref 3.8–10.8)

## 2023-02-24 LAB — VITAMIN B12: Vitamin B-12: 473 pg/mL (ref 200–1100)

## 2023-02-24 NOTE — Progress Notes (Signed)
HI Wanda Gonzales, your metabolic panel is OK. Your LDL cholesterol went.  It has been trending up over the last couple of years.  It is high enough that based on current guidelines you would benefit from being on a statin.  This would be a medication that you would take at nighttime to reduce your cholesterol and plaque formation which in turn reduces your risk for cardiovascular disease including heart attack and stroke.  In addition I would recommend working work on eating a Mediterranean style diet which has been shown to be helpful to reduce cholesterol and heart disease.  If you are okay with starting medication please let me know.  Blood count and B12 look great.

## 2023-02-25 MED ORDER — ROSUVASTATIN CALCIUM 10 MG PO TABS: 10.0000 mg | ORAL_TABLET | Freq: Every day | ORAL | 3 refills | Status: DC

## 2023-02-25 NOTE — Telephone Encounter (Signed)
Meds ordered this encounter  °Medications  ° rosuvastatin (CRESTOR) 10 MG tablet  °  Sig: Take 1 tablet (10 mg total) by mouth at bedtime.  °  Dispense:  90 tablet  °  Refill:  3  ° ° °

## 2023-02-27 ENCOUNTER — Encounter (INDEPENDENT_AMBULATORY_CARE_PROVIDER_SITE_OTHER): Payer: Medicare Other | Admitting: Family Medicine

## 2023-02-27 DIAGNOSIS — E785 Hyperlipidemia, unspecified: Secondary | ICD-10-CM | POA: Diagnosis not present

## 2023-02-28 ENCOUNTER — Encounter (HOSPITAL_COMMUNITY)
Admission: RE | Admit: 2023-02-28 | Discharge: 2023-02-28 | Disposition: A | Discharge status: Home / Self Care | Payer: Medicare Other | Source: Ambulatory Visit | Attending: Orthopedic Surgery | Admitting: Orthopedic Surgery

## 2023-02-28 ENCOUNTER — Ambulatory Visit (HOSPITAL_COMMUNITY)
Admission: RE | Admit: 2023-02-28 | Discharge: 2023-02-28 | Disposition: A | Discharge status: Home / Self Care | Payer: Medicare Other | Source: Ambulatory Visit | Attending: Orthopedic Surgery | Admitting: Orthopedic Surgery

## 2023-02-28 DIAGNOSIS — Z96652 Presence of left artificial knee joint: Secondary | ICD-10-CM | POA: Diagnosis not present

## 2023-02-28 MED ORDER — TECHNETIUM TC 99M MEDRONATE IV KIT
20.0000 | PACK | Freq: Once | INTRAVENOUS | Status: AC | PRN
  Administered 2023-02-28: 20 via INTRAVENOUS

## 2023-02-28 NOTE — Telephone Encounter (Signed)
I spent 5 total minutes of online digital evaluation and management services in this patient-initiated request for online care. 

## 2023-03-06 ENCOUNTER — Ambulatory Visit (INDEPENDENT_AMBULATORY_CARE_PROVIDER_SITE_OTHER): Payer: Medicare Other

## 2023-03-06 DIAGNOSIS — R519 Headache, unspecified: Secondary | ICD-10-CM | POA: Diagnosis not present

## 2023-03-06 MED ORDER — GADOBUTROL 1 MMOL/ML IV SOLN
6.5000 mL | Freq: Once | INTRAVENOUS | Status: AC | PRN
  Administered 2023-03-06: 6.5 mL via INTRAVENOUS

## 2023-03-07 NOTE — Progress Notes (Signed)
HI Edom, your MRI is reassuring. Nothing specific to explain your headaches.  If you are still having them I would recommend we refer you to Neurology to assess for other causes.

## 2023-03-09 DIAGNOSIS — Z96652 Presence of left artificial knee joint: Secondary | ICD-10-CM | POA: Diagnosis not present

## 2023-03-09 DIAGNOSIS — M7062 Trochanteric bursitis, left hip: Secondary | ICD-10-CM | POA: Diagnosis not present

## 2023-05-08 DIAGNOSIS — K080 Exfoliation of teeth due to systemic causes: Secondary | ICD-10-CM | POA: Diagnosis not present

## 2023-05-12 DIAGNOSIS — K080 Exfoliation of teeth due to systemic causes: Secondary | ICD-10-CM | POA: Diagnosis not present

## 2023-05-16 DIAGNOSIS — K080 Exfoliation of teeth due to systemic causes: Secondary | ICD-10-CM | POA: Diagnosis not present

## 2023-05-24 DIAGNOSIS — K080 Exfoliation of teeth due to systemic causes: Secondary | ICD-10-CM | POA: Diagnosis not present

## 2023-06-21 DIAGNOSIS — K080 Exfoliation of teeth due to systemic causes: Secondary | ICD-10-CM | POA: Diagnosis not present

## 2023-07-12 ENCOUNTER — Ambulatory Visit: Payer: Medicare Other | Admitting: Family Medicine

## 2023-07-12 DIAGNOSIS — Z0000 Encounter for general adult medical examination without abnormal findings: Secondary | ICD-10-CM | POA: Diagnosis not present

## 2023-07-12 NOTE — Patient Instructions (Addendum)
MEDICARE ANNUAL WELLNESS VISIT Health Maintenance Summary and Written Plan of Care  Wanda Gonzales ,  Thank you for allowing me to perform your Medicare Annual Wellness Visit and for your ongoing commitment to your health.   Health Maintenance & Immunization History Health Maintenance  Topic Date Due   COVID-19 Vaccine (7 - 2023-24 season) 07/28/2023 (Originally 05/21/2023)   INFLUENZA VACCINE  12/18/2023 (Originally 04/20/2023)   Medicare Annual Wellness (AWV)  07/11/2024   MAMMOGRAM  10/12/2024   Colonoscopy  12/25/2024   DEXA SCAN  08/31/2025   DTaP/Tdap/Td (4 - Td or Tdap) 09/15/2029   Pneumonia Vaccine 67+ Years old  Completed   Hepatitis C Screening  Completed   Zoster Vaccines- Shingrix  Completed   HPV VACCINES  Aged Out   Immunization History  Administered Date(s) Administered   Fluad Quad(high Dose 65+) 07/08/2022   Fluzone Influenza virus vaccine,trivalent (IIV3), split virus 07/01/2009   Influenza Inj Mdck Quad Pf 07/02/2021   Influenza Whole 07/01/2009   Influenza, Seasonal, Injecte, Preservative Fre 08/15/2012, 08/21/2013   Influenza,inj,Quad PF,6+ Mos 06/01/2015, 07/18/2017, 08/24/2018, 05/27/2020   Influenza-Unspecified 08/15/2012, 08/21/2013, 06/01/2015, 05/02/2016, 07/18/2017, 08/24/2018   PFIZER Comirnaty(Gray Top)Covid-19 Tri-Sucrose Vaccine 02/19/2021   PFIZER(Purple Top)SARS-COV-2 Vaccination 11/16/2019, 12/03/2019, 07/07/2020   PNEUMOCOCCAL CONJUGATE-20 02/10/2022   PPD Test 05/29/2013, 06/03/2013, 10/18/2016   Pfizer Covid-19 Vaccine Bivalent Booster 9yrs & up 07/02/2021   Pfizer(Comirnaty)Fall Seasonal Vaccine 12 years and older 07/08/2022   Td 09/19/2005, 01/24/2009   Tdap 09/16/2019   Zoster Recombinant(Shingrix) 10/05/2018, 01/06/2019    These are the patient goals that we discussed:  Goals Addressed               This Visit's Progress     Patient Stated (pt-stated)        Patient stated she would like to increase her walking exercise.          This is a list of Health Maintenance Items that are overdue or due now: Influenza vaccine    Orders/Referrals Placed Today: No orders of the defined types were placed in this encounter.  (Contact our referral department at [phone] if you have not spoken with someone about your referral appointment within the next 5 days)    Follow-up Plan Follow-up with Agapito Games, MD as planned Schedule influenza vaccine in the office or the pharmacy. Medicare wellness visit in one year.   Patient will access AVS on my chart.      Health Maintenance, Female Adopting a healthy lifestyle and getting preventive care are important in promoting health and wellness. Ask your health care provider about: The right schedule for you to have regular tests and exams. Things you can do on your own to prevent diseases and keep yourself healthy. What should I know about diet, weight, and exercise? Eat a healthy diet  Eat a diet that includes plenty of vegetables, fruits, low-fat dairy products, and lean protein. Do not eat a lot of foods that are high in solid fats, added sugars, or sodium. Maintain a healthy weight Body mass index (BMI) is used to identify weight problems. It estimates body fat based on height and weight. Your health care provider can help determine your BMI and help you achieve or maintain a healthy weight. Get regular exercise Get regular exercise. This is one of the most important things you can do for your health. Most adults should: Exercise for at least 150 minutes each week. The exercise should increase your heart rate and make you  sweat (moderate-intensity exercise). Do strengthening exercises at least twice a week. This is in addition to the moderate-intensity exercise. Spend less time sitting. Even light physical activity can be beneficial. Watch cholesterol and blood lipids Have your blood tested for lipids and cholesterol at 66 years of age, then have this  test every 5 years. Have your cholesterol levels checked more often if: Your lipid or cholesterol levels are high. You are older than 67 years of age. You are at high risk for heart disease. What should I know about cancer screening? Depending on your health history and family history, you may need to have cancer screening at various ages. This may include screening for: Breast cancer. Cervical cancer. Colorectal cancer. Skin cancer. Lung cancer. What should I know about heart disease, diabetes, and high blood pressure? Blood pressure and heart disease High blood pressure causes heart disease and increases the risk of stroke. This is more likely to develop in people who have high blood pressure readings or are overweight. Have your blood pressure checked: Every 3-5 years if you are 43-60 years of age. Every year if you are 13 years old or older. Diabetes Have regular diabetes screenings. This checks your fasting blood sugar level. Have the screening done: Once every three years after age 54 if you are at a normal weight and have a low risk for diabetes. More often and at a younger age if you are overweight or have a high risk for diabetes. What should I know about preventing infection? Hepatitis B If you have a higher risk for hepatitis B, you should be screened for this virus. Talk with your health care provider to find out if you are at risk for hepatitis B infection. Hepatitis C Testing is recommended for: Everyone born from [birth year] through 1964. Anyone with known risk factors for hepatitis C. Sexually transmitted infections (STIs) Get screened for STIs, including gonorrhea and chlamydia, if: You are sexually active and are younger than 66 years of age. You are older than 66 years of age and your health care provider tells you that you are at risk for this type of infection. Your sexual activity has changed since you were last screened, and you are at increased risk for chlamydia or  gonorrhea. Ask your health care provider if you are at risk. Ask your health care provider about whether you are at high risk for HIV. Your health care provider may recommend a prescription medicine to help prevent HIV infection. If you choose to take medicine to prevent HIV, you should first get tested for HIV. You should then be tested every 3 months for as long as you are taking the medicine. Pregnancy If you are about to stop having your period (premenopausal) and you may become pregnant, seek counseling before you get pregnant. Take 400 to 800 micrograms (mcg) of folic acid every day if you become pregnant. Ask for birth control (contraception) if you want to prevent pregnancy. Osteoporosis and menopause Osteoporosis is a disease in which the bones lose minerals and strength with aging. This can result in bone fractures. If you are 58 years old or older, or if you are at risk for osteoporosis and fractures, ask your health care provider if you should: Be screened for bone loss. Take a calcium or vitamin D supplement to lower your risk of fractures. Be given hormone replacement therapy (HRT) to treat symptoms of menopause. Follow these instructions at home: Alcohol use Do not drink alcohol if: Your health care provider  tells you not to drink. You are pregnant, may be pregnant, or are planning to become pregnant. If you drink alcohol: Limit how much you have to: 0-1 drink a day. Know how much alcohol is in your drink. In the [place] one drink equals one 12 oz bottle of beer (355 mL), one 5 oz glass of wine (148 mL), or one 1 oz glass of hard liquor (44 mL). Lifestyle Do not use any products that contain nicotine or tobacco. These products include cigarettes, chewing tobacco, and vaping devices, such as e-cigarettes. If you need help quitting, ask your health care provider. Do not use street drugs. Do not share needles. Ask your health care provider for help if you need support or  information about quitting drugs. General instructions Schedule regular health, dental, and eye exams. Stay current with your vaccines. Tell your health care provider if: You often feel depressed. You have ever been abused or do not feel safe at home. Summary Adopting a healthy lifestyle and getting preventive care are important in promoting health and wellness. Follow your health care provider's instructions about healthy diet, exercising, and getting tested or screened for diseases. Follow your health care provider's instructions on monitoring your cholesterol and blood pressure. This information is not intended to replace advice given to you by your health care provider. Make sure you discuss any questions you have with your health care provider. Document Revised: 01/25/2021 Document Reviewed: 01/25/2021 Elsevier Patient Education  2024 [employer].

## 2023-07-12 NOTE — Progress Notes (Signed)
MEDICARE ANNUAL WELLNESS VISIT  07/12/2023  Telephone Visit Disclaimer This Medicare AWV was conducted by telephone due to national recommendations for restrictions regarding the COVID-19 Pandemic (e.g. social distancing).  I verified, using two identifiers, that I am speaking with Wanda Gonzales or their authorized healthcare agent. I discussed the limitations, risks, security, and privacy concerns of performing an evaluation and management service by telephone and the potential availability of an in-person appointment in the future. The patient expressed understanding and agreed to proceed.  Location of Patient: Home Location of Provider (nurse):  In the office.  Subjective:    Wanda Gonzales is a 67 y.o. female patient of Metheney, Barbarann Ehlers, MD who had a Medicare Annual Wellness Visit today via telephone. Wanda Gonzales is Working part time and lives with their spouse. she does not have any children. she reports that she is socially active and does interact with friends/family regularly. she is moderately physically active and enjoys baking, traveling and art.  Patient Care Team: Agapito Games, MD as PCP - General Oscar La Craig Guess, MD (Inactive) as Consulting Physician (Obstetrics and Gynecology)     07/12/2023    8:23 AM 08/23/2022    4:18 PM 07/27/2017    4:26 PM 03/03/2015   11:03 AM 02/23/2012    9:14 AM  Advanced Directives  Does Patient Have a Medical Advance Directive? Yes No No Yes Patient does not have advance directive  Type of Advance Directive Living will      Does patient want to make changes to medical advance directive? No - Patient declined      Copy of Healthcare Power of Attorney in Chart?    No - copy requested   Would patient like information on creating a medical advance directive?  No - Patient declined No - Patient declined    Pre-existing out of facility DNR order (yellow form or pink MOST form)     No    Hospital Utilization Over the Past 12 Months: #  of hospitalizations or ER visits: 0 # of surgeries: 0  Review of Systems    Patient reports that her overall health is unchanged compared to last year.  History obtained from chart review and the patient  Patient Reported Readings (BP, Pulse, CBG, Weight, etc) none Per patient no change in vitals since last visit, unable to obtain new vitals due to telehealth visit  Pain Assessment Pain : No/denies pain     Current Medications & Allergies (verified) Allergies as of 07/12/2023       Reactions   Codeine Other (See Comments)   REACTION: nausea and dizzy   Doxycycline Nausea Only   Concerta [methylphenidate] Hives, Rash   Sulfa Antibiotics Nausea Only        Medication List        Accurate as of July 12, 2023  8:39 AM. If you have any questions, ask your nurse or doctor.          STOP taking these medications    meloxicam 15 MG tablet Commonly known as: MOBIC   predniSONE 10 MG (48) Tbpk tablet Commonly known as: STERAPRED UNI-PAK 48 TAB   triamcinolone cream 0.1 % Commonly known as: KENALOG       TAKE these medications    acetaminophen 325 MG tablet Commonly known as: TYLENOL Take 650 mg by mouth as needed.   alendronate 70 MG tablet Commonly known as: FOSAMAX Take 70 mg by mouth once a week.   escitalopram  10 MG tablet Commonly known as: LEXAPRO TAKE 1 TABLET(10 MG) BY MOUTH DAILY   hydrOXYzine 25 MG tablet Commonly known as: ATARAX Take 1 tablet (25 mg total) by mouth 3 (three) times daily as needed for itching.   hyoscyamine 0.375 MG 12 hr tablet Commonly known as: LEVBID Take 0.375 mg by mouth as needed for cramping.   multivitamin tablet Take 1 tablet by mouth daily.   omeprazole 40 MG capsule Commonly known as: PRILOSEC TAKE 1 CAPSULE(40 MG) BY MOUTH DAILY BEFORE BREAKFAST   rosuvastatin 10 MG tablet Commonly known as: Crestor Take 1 tablet (10 mg total) by mouth at bedtime.   VITAMIN D PO Take by mouth daily.         History (reviewed): Past Medical History:  Diagnosis Date   Abnormal Pap smear, atypical squamous cells of undetermined sign (ASC-US) 1998   treated with cryo, CIN I   ADD (attention deficit disorder) 02-23-12   easily distracted.   Arthritis 02-23-12   Rt. hip and lt. arm   Closed displaced fracture of fifth metatarsal bone of left foot 03/31/2017   Depression with anxiety    History of osteoporosis    Kidney calculi 07/2015   Kidney stone    PONV (postoperative nausea and vomiting) 02-23-12   always has PONV   Post-nasal drainage 02-23-12   frequent a problem,causes a cough and mucus buildup in throat   Urethral polyp 1998   Dr. Marcello Fennel   Varicose veins    Venous insufficiency    chronic-denies any problems   Past Surgical History:  Procedure Laterality Date   blood clot removed Left 01/2012   blood clot removed from left hand    BREAST BIOPSY Right 02/14/2012   Fibroadenoma   COLONOSCOPY  08/2003   repeat 5 yrs.   COLONOSCOPY W/ BIOPSIES  12/2008   1 polyp recheck 5 yrs   EXCISION/RELEASE BURSA HIP  02/29/2012   HAND SURGERY  02/23/2012   "blood clot" excised palm left hand 01-18-12   KNEE ARTHROSCOPY W/ MENISCAL REPAIR  10/2012   KNEE ARTHROSCOPY WITH EXCISION BAKER'S CYST Left 10/2012   meniscal repair   ORIF METATARSAL FRACTURE Left 04/07/2017   Left 5th Metatarsal   SHOULDER SURGERY Right 02/2015   Dr. 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   Family History  Problem Relation Age of Onset   Breast cancer Mother 19       breast   Hyperlipidemia Mother    Brain cancer Father 13       brain   Osteoporosis Maternal Grandmother    Drug abuse Brother    Social History   Socioeconomic History   Marital status: Married    Spouse name: Ronaldo Miyamoto   Number of children: 0   Years of education: 16   Highest education level: Bachelor's degree (e.g., BA, AB, BS)  Occupational History    Occupation: works part-time (two days a week)  Tobacco Use   Smoking status: Never   Smokeless tobacco: Never  Vaping Use   Vaping status: Never Used  Substance and Sexual Activity   Alcohol use: Yes    Alcohol/week: 1.0 standard drink of alcohol    Types: 1 Glasses of wine per week    Comment: rare occ.   Drug use: No   Sexual activity: Not Currently    Birth control/protection: Post-menopausal  Other Topics Concern  Not on file  Social History Narrative   Lives with spouse. She does not have any children. She still works part-time. She enjoys travelling, travelling and art.   Social Determinants of Health   Financial Resource Strain: Low Risk  (07/12/2023)   Overall Financial Resource Strain (CARDIA)    Difficulty of Paying Living Expenses: Not hard at all  Food Insecurity: No Food Insecurity (07/12/2023)   Hunger Vital Sign    Worried About Running Out of Food in the Last Year: Never true    Ran Out of Food in the Last Year: Never true  Transportation Needs: No Transportation Needs (07/12/2023)   PRAPARE - Administrator, Civil Service (Medical): No    Lack of Transportation (Non-Medical): No  Physical Activity: Insufficiently Active (07/12/2023)   Exercise Vital Sign    Days of Exercise per Week: 3 days    Minutes of Exercise per Session: 40 min  Stress: No Stress Concern Present (07/12/2023)   Wanda Gonzales    Feeling of Stress : Not at all  Social Connections: Moderately Integrated (07/12/2023)   Social Connection and Isolation Panel [NHANES]    Frequency of Communication with Friends and Family: More than three times a week    Frequency of Social Gatherings with Friends and Family: Once a week    Attends Religious Services: More than 4 times per year    Active Member of [employer] or Organizations: No    Attends Banker Meetings: Never    Marital Status: Married    Activities of  Daily Living    07/12/2023    8:28 AM  In your present state of health, do you have any difficulty performing the following activities:  Hearing? 1  Comment bilateral hearing aids.  Vision? 0  Comment wears glasses.  Difficulty concentrating or making decisions? 0  Walking or climbing stairs? 1  Comment climbing stairs are difficult  Dressing or bathing? 0  Doing errands, shopping? 0  Preparing Food and eating ? N  Using the Toilet? N  In the past six months, have you accidently leaked urine? Y  Do you have problems with loss of bowel control? N  Managing your Medications? N  Managing your Finances? N  Housekeeping or managing your Housekeeping? N    Patient Education/ Literacy How often do you need to have someone help you when you read instructions, pamphlets, or other written materials from your doctor or pharmacy?: 1 - Never What is the last grade level you completed in school?: Bachelor's degree  Exercise    Diet Patient reports consuming 3 meals a day and 2-3 snack(s) a day Patient reports that her primary diet is: Regular Patient reports that she does have regular access to food.   Depression Screen    07/12/2023    8:24 AM 08/17/2022   12:54 PM 07/05/2022    4:43 PM 07/05/2022    3:40 PM 02/10/2022    9:58 AM 11/26/2020    3:23 PM 07/29/2020   11:27 AM  PHQ 2/9 Scores  PHQ - 2 Score 0 0 0 0 0 0 0  PHQ- 9 Score  2   2 1 1      Fall Risk    07/12/2023    8:23 AM 02/16/2023   11:21 AM 10/05/2022    1:36 PM 08/17/2022   12:54 PM 07/05/2022    4:45 PM  Fall Risk   Falls in the past  year? 1 1 1 1  0  Number falls in past yr: 1 1 1 1  0  Injury with Fall? 0 1 1 1  0  Risk for fall due to : History of fall(s) History of fall(s) History of fall(s);Impaired vision;Impaired balance/gait History of fall(s)   Follow up Falls evaluation completed;Education provided;Falls prevention discussed Falls evaluation completed;Education provided Falls evaluation completed  Falls evaluation completed      Objective:  Wanda Gonzales seemed alert and oriented and she participated appropriately during our telephone visit.  Blood Pressure Weight BMI  BP Readings from Last 3 Encounters:  02/20/23 (!) 86/59  02/16/23 101/64  10/05/22 122/80   Wt Readings from Last 3 Encounters:  08/17/22 144 lb (65.3 kg)  07/05/22 144 lb (65.3 kg)  09/23/20 143 lb (64.9 kg)   BMI Readings from Last 1 Encounters:  02/20/23 27.21 kg/m    *Unable to obtain current vital signs, weight, and BMI due to telephone visit type  Hearing/Vision  Wanda Gonzales did not seem to have difficulty with hearing/understanding during the telephone conversation Reports that she has had a formal eye exam by an eye care professional within the past year Reports that she has had a formal hearing evaluation within the past year *Unable to fully assess hearing and vision during telephone visit type  Cognitive Function:    07/12/2023    8:30 AM 07/05/2022    4:45 PM 07/05/2022    4:42 PM  6CIT Screen  What Year? 0 points 0 points 0 points  What month? 0 points 0 points 0 points  What time? 0 points 0 points 0 points  Count back from 20 0 points 0 points 0 points  Months in reverse 0 points 0 points 0 points  Repeat phrase 0 points 2 points 2 points  Total Score 0 points 2 points 2 points   (Normal:0-7, Significant for Dysfunction: >8)  Normal Cognitive Function Screening: Yes   Immunization & Health Maintenance Record Immunization History  Administered Date(s) Administered   Fluad Quad(high Dose 65+) 07/08/2022   Fluzone Influenza virus vaccine,trivalent (IIV3), split virus 07/01/2009   Influenza Inj Mdck Quad Pf 07/02/2021   Influenza Whole 07/01/2009   Influenza, Seasonal, Injecte, Preservative Fre 08/15/2012, 08/21/2013   Influenza,inj,Quad PF,6+ Mos 06/01/2015, 07/18/2017, 08/24/2018, 05/27/2020   Influenza-Unspecified 08/15/2012, 08/21/2013, 06/01/2015, 05/02/2016, 07/18/2017,  08/24/2018   PFIZER Comirnaty(Gray Top)Covid-19 Tri-Sucrose Vaccine 02/19/2021   PFIZER(Purple Top)SARS-COV-2 Vaccination 11/16/2019, 12/03/2019, 07/07/2020   PNEUMOCOCCAL CONJUGATE-20 02/10/2022   PPD Test 05/29/2013, 06/03/2013, 10/18/2016   Pfizer Covid-19 Vaccine Bivalent Booster 30yrs & up 07/02/2021   Pfizer(Comirnaty)Fall Seasonal Vaccine 12 years and older 07/08/2022   Td 09/19/2005, 01/24/2009   Tdap 09/16/2019   Zoster Recombinant(Shingrix) 10/05/2018, 01/06/2019    Health Maintenance  Topic Date Due   COVID-19 Vaccine (7 - 2023-24 season) 07/28/2023 (Originally 05/21/2023)   INFLUENZA VACCINE  12/18/2023 (Originally 04/20/2023)   Medicare Annual Wellness (AWV)  07/11/2024   MAMMOGRAM  10/12/2024   Colonoscopy  12/25/2024   DEXA SCAN  08/31/2025   DTaP/Tdap/Td (4 - Td or Tdap) 09/15/2029   Pneumonia Vaccine 1+ Years old  Completed   Hepatitis C Screening  Completed   Zoster Vaccines- Shingrix  Completed   HPV VACCINES  Aged Out       Assessment  This is a routine wellness examination for Wanda Gonzales.  Health Maintenance: Due or Overdue There are no preventive care reminders to display for this patient.   Wanda Gonzales does not need  a Gonzales for Community Assistance: Care Management:   no Social Work:    no Prescription Assistance:  no Nutrition/Diabetes Education:  no   Plan:  Personalized Goals  Goals Addressed               This Visit's Progress     Patient Stated (pt-stated)        Patient stated she would like to increase her walking exercise.       Personalized Health Maintenance & Screening Recommendations  Influenza vaccine  Lung Cancer Screening Recommended: no (Low Dose CT Chest recommended if Age 34-80 years, 20 pack-year currently smoking OR have quit w/in past 15 years) Hepatitis C Screening recommended: no HIV Screening recommended: no  Advanced Directives: Written information was not prepared per patient's request.  Referrals &  Orders No orders of the defined types were placed in this encounter.   Follow-up Plan Follow-up with Agapito Games, MD as planned Schedule influenza vaccine in the office or the pharmacy. Medicare wellness visit in one year.   Patient will access AVS on my chart.   I have personally reviewed and noted the following in the patient's chart:   Medical and social history Use of alcohol, tobacco or illicit drugs  Current medications and supplements Functional ability and status Nutritional status Physical activity Advanced directives List of other physicians Hospitalizations, surgeries, and ER visits in previous 12 months Vitals Screenings to include cognitive, depression, and falls Referrals and appointments  In addition, I have reviewed and discussed with Wanda Gonzales certain preventive protocols, quality metrics, and best practice recommendations. A written personalized care plan for preventive services as well as general preventive health recommendations is available and can be mailed to the patient at her request.      Modesto Charon, RN BSN  07/12/2023

## 2023-07-13 ENCOUNTER — Ambulatory Visit: Payer: Medicare Other

## 2023-07-13 VITALS — BP 100/62 | HR 82 | Temp 97.5°F | Ht 61.0 in

## 2023-07-13 DIAGNOSIS — Z23 Encounter for immunization: Secondary | ICD-10-CM

## 2023-07-14 ENCOUNTER — Encounter: Payer: Self-pay | Admitting: Physician Assistant

## 2023-07-14 ENCOUNTER — Ambulatory Visit (INDEPENDENT_AMBULATORY_CARE_PROVIDER_SITE_OTHER): Payer: Medicare Other | Admitting: Physician Assistant

## 2023-07-14 VITALS — BP 91/58 | HR 81 | Ht 61.0 in

## 2023-07-14 DIAGNOSIS — R809 Proteinuria, unspecified: Secondary | ICD-10-CM | POA: Insufficient documentation

## 2023-07-14 DIAGNOSIS — R102 Pelvic and perineal pain unspecified side: Secondary | ICD-10-CM | POA: Insufficient documentation

## 2023-07-14 LAB — POCT URINALYSIS DIP (CLINITEK)
Blood, UA: NEGATIVE
Glucose, UA: NEGATIVE mg/dL
Ketones, POC UA: NEGATIVE mg/dL
Leukocytes, UA: NEGATIVE
Nitrite, UA: NEGATIVE
POC PROTEIN,UA: 30 — AB
Spec Grav, UA: >=1.03 — AB (ref 1.010–1.025)
Urobilinogen, UA: 1 U/dL
pH, UA: 5.5 (ref 5.0–8.0)

## 2023-07-14 NOTE — Progress Notes (Signed)
Acute Office Visit  Subjective:     Patient ID: Wanda Gonzales, female    DOB: 1955-10-02, 67 y.o.   MRN: [id number]  Chief Complaint  Patient presents with   vaginal discomfort    C/o  throbbing / punching sensation in vaginal area x Wednesday 07/12/23- constant but improving in intensity . ( States feels like when she was on her period before menopause)=kph    HPI Patient is in today for pelvic pain and pressure for the last 3 days. She describes the pain as pressure, throbbing, punching in her vagina. No injury. Pain is constant but seems to be improving some. Tylenol does help. She has not had pain like this in years but feels like she did when she was on her period. She is menopausal now. She is not sexually active. Denies any vaginal discharge. No urinary symptoms. No fever, chills, body aches.   .. 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   Benign paroxysmal positional vertigo 08/25/2016   Seborrheic keratosis 11/23/2017   Rhinitis 03/18/2020   Age-related osteoporosis without current pathological fracture 07/29/2020   Osteoporosis 07/27/2020   Depression with anxiety    History of osteoporosis    Frequent falls 11/26/2020   Hearing aid worn 02/10/2022   IBS (irritable bowel syndrome with diarrhea) 08/17/2022   Pain in left knee 09/07/2018   Trochanteric bursitis of left hip 02/06/2022   Irritant contact dermatitis 02/20/2023   Pelvic pressure in female 07/14/2023   Proteinuria 07/14/2023   Pelvic pain 07/14/2023   Resolved Ambulatory Problems     Diagnosis Date Noted   MENOPAUSAL DISORDER 12/13/2007   CHEST PAIN, ATYPICAL 11/10/2010   Abdominal pain, generalized 11/10/2010   Baker's cyst 03/18/2013   Right shoulder pain 05/15/2014   Fall 12/16/2015   Baker's cyst of knee, left 08/25/2016   Closed displaced fracture of fifth metatarsal bone of left foot 03/31/2017   Skin tag 11/29/2017   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      Objective:    BP (!) 91/58   Pulse 81   Ht 5\' 1"  (1.549 m)   LMP 12/18/2013   SpO2 97%   BMI 27.21 kg/m  BP Readings from Last 3 Encounters:  07/14/23 (!) 91/58  07/13/23 100/62  02/20/23 (!) 86/59   Wt Readings from Last 3 Encounters:  08/17/22 144 lb (65.3 kg)  07/05/22 144 lb (65.3 kg)  09/23/20 143 lb (64.9 kg)    .Marland Kitchen Results for orders placed or performed in visit on 07/14/23  POCT URINALYSIS DIP (CLINITEK)  Result Value Ref Range   Color,  UA yellow yellow   Clarity, UA cloudy (A) clear   Glucose, UA negative negative mg/dL   Bilirubin, UA small (A) negative   Ketones, POC UA negative negative mg/dL   Spec Grav, UA >=0.981 (A) 1.010 - 1.025   Blood, UA negative negative   pH, UA 5.5 5.0 - 8.0   POC PROTEIN,UA =30 (A) negative, trace   Urobilinogen, UA 1.0 0.2 or 1.0 E.U./dL   Nitrite, UA Negative Negative   Leukocytes, UA Negative Negative     Physical Exam Vitals reviewed.  Constitutional:      Appearance: Normal appearance.  HENT:     Head: Normocephalic.  Cardiovascular:     Rate and Rhythm: Normal rate.  Pulmonary:     Effort:  Pulmonary effort is normal.     Breath sounds: Normal breath sounds.  Abdominal:     General: Bowel sounds are normal. There is no distension.     Palpations: Abdomen is soft. There is no mass.     Tenderness: There is no abdominal tenderness. There is no right CVA tenderness, left CVA tenderness, guarding or rebound.     Hernia: No hernia is present.  Genitourinary:    General: Normal vulva.     Vagina: No vaginal discharge.     Rectum: Normal.     Comments: No external signs of cystocele or uterine prolapse Neurological:     General: No focal deficit present.     Mental Status: She is alert and oriented to person, place, and time.  Psychiatric:        Mood and Affect: Mood normal.         Assessment & Plan:  Marland KitchenMarland KitchenTresea was seen today for vaginal discomfort.  Diagnoses and all orders for this visit:  Pelvic pain -     US Pelvic Complete With Transvaginal; Future -     POCT URINALYSIS DIP (CLINITEK) -     Urine Culture  Pelvic pressure in female -     US Pelvic Complete With Transvaginal; Future -     POCT URINALYSIS DIP (CLINITEK) -     Cancel: Urinalysis with Culture Reflex -     Urine Culture  Proteinuria, unspecified type -     Urine Culture   UA negative for blood/leuks/nitrites but had some protein in it-follow up in 2-4 weeks with PcP to make sure resolved Will culture urine to confirm no bacteria in urine No obvious signs on exam today for pain and pressure Will order pelvic ultrasound    Tandy Gaw, PA-C

## 2023-07-14 NOTE — Patient Instructions (Signed)
Will get ultrasound of pelvis

## 2023-07-16 LAB — URINE CULTURE

## 2023-07-17 ENCOUNTER — Ambulatory Visit (INDEPENDENT_AMBULATORY_CARE_PROVIDER_SITE_OTHER): Payer: Medicare Other

## 2023-07-17 ENCOUNTER — Other Ambulatory Visit: Payer: Self-pay | Admitting: Physician Assistant

## 2023-07-17 DIAGNOSIS — R102 Pelvic and perineal pain: Secondary | ICD-10-CM

## 2023-07-17 DIAGNOSIS — R809 Proteinuria, unspecified: Secondary | ICD-10-CM

## 2023-07-17 NOTE — Progress Notes (Signed)
No significant bacteria growth on urine culture.

## 2023-08-03 ENCOUNTER — Ambulatory Visit: Payer: Medicare Other | Admitting: Obstetrics and Gynecology

## 2023-08-03 ENCOUNTER — Encounter: Payer: Self-pay | Admitting: Obstetrics and Gynecology

## 2023-08-03 VITALS — BP 106/72 | HR 78 | Ht 61.0 in

## 2023-08-03 DIAGNOSIS — M7918 Myalgia, other site: Secondary | ICD-10-CM | POA: Diagnosis not present

## 2023-08-03 NOTE — Progress Notes (Signed)
    NEW GYNECOLOGY VISIT  Subjective:  Wanda Gonzales is a 67 y.o. menopausal G0P0 presenting to establish care and discuss pelvic pain  Reports 1 week of pelvic pain at the end of October. Describes pain as throbbing/pounding and localized to her vagina/vulva. Pain was similar to what she would get prior to her periods. No aggravating/alleviating factors, no identifiable trigger. Pain resolved spontaneously. Denies nausea/vomiting, fevers/chills, vaginal discharge, vaginal bleeding. Gets occasional dysuria and SUI, but no current/bothersome bladder symptoms. Has hx IBS, diarrhea predominant, f/w GI. Saw PCP for these concerns - transabdominal pelvic US obtained. No read currently available, but I personally reviewed the images which show a normal sized uterus with EL 3.11mm. The ovaries were not clearly identified.  Not currently sexually active. Menopause in late 76s.   I personally reviewed the following: - PCP note by Tandy Gaw 07/14/23 - Pelvic ultrasound 07/17/23 with my interpretation outlined above - UA/Ucx 07/14/23 unremarkable - Pap 10/05/2022 NILM/HPV negative - MMG/breast US 10/12/22 & 10/21/22 BI-RADS 2  Objective:   Vitals:   08/03/23 1015  BP: 106/72  Pulse: 78  Height: 5\' 1"  (1.549 m)   General:  Alert, oriented and cooperative. Patient is in no acute distress.  Skin: Skin is warm and dry. No rash noted.   Cardiovascular: Normal heart rate noted  Respiratory: Normal respiratory effort, no problems with respiration noted  Abdomen: Soft, non-tender, non-distended   Pelvic: NEFG - no rash or lesion noted. Pale vaginal mucosa, no lacerations/abrasions noted. +right levator & obturator tenderness. No significant pelvic organ prolapse. Cervix visually normal. Uterus and adnexa non palpable, non tender.   Exam performed in the presence of a chaperone  Assessment and Plan:  Wanda Gonzales is a 67 y.o. with myofascial pain, now resolved  Discussed that her exam is consistent  with myofascial pain. No e/o of structural abnormality, mass, lesion or trauma that is causing her pain. Reviewed option for PFPT but since this pain is not regularly bothering her she would like to defer for now.   Return in about 1 year (around 08/02/2024) for annual exam or sooner as needed.  Future Appointments  Date Time Provider Department Center  08/23/2023  9:30 AM Agapito Games, MD [facility] None  07/17/2024  8:00 AM [facility] VISIT [facility] None    Lennart Pall, MD

## 2023-08-11 NOTE — Progress Notes (Signed)
No pelvic u/s concerns. GREAT news.

## 2023-08-23 ENCOUNTER — Encounter: Payer: Self-pay | Admitting: Family Medicine

## 2023-08-23 ENCOUNTER — Ambulatory Visit (INDEPENDENT_AMBULATORY_CARE_PROVIDER_SITE_OTHER): Payer: Medicare Other | Admitting: Family Medicine

## 2023-08-23 VITALS — BP 98/46 | HR 81 | Ht 61.0 in

## 2023-08-23 DIAGNOSIS — R251 Tremor, unspecified: Secondary | ICD-10-CM | POA: Insufficient documentation

## 2023-08-23 DIAGNOSIS — F418 Other specified anxiety disorders: Secondary | ICD-10-CM | POA: Diagnosis not present

## 2023-08-23 DIAGNOSIS — E559 Vitamin D deficiency, unspecified: Secondary | ICD-10-CM

## 2023-08-23 DIAGNOSIS — R8290 Unspecified abnormal findings in urine: Secondary | ICD-10-CM | POA: Diagnosis not present

## 2023-08-23 MED ORDER — ESCITALOPRAM OXALATE 10 MG PO TABS: 10.0000 mg | ORAL_TABLET | Freq: Every day | ORAL | 1 refills | Status: DC

## 2023-08-23 NOTE — Progress Notes (Signed)
Established Patient Office Visit  Subjective   Patient ID: Wanda Gonzales, female    DOB: Mar 24, 1956  Age: 67 y.o. MRN: [id number]  Chief Complaint  Patient presents with   mood    HPI  Mood-overall she actually feels like she is doing really well she is currently on Lexapro 10 mg occasionally she will take a second tab if she is under a lot of stress.  Work was particularly stressful right before Thanksgiving and then she had people over to the house.  But she is otherwise doing really well.  She did want to mention a tremor that she has noticed predominantly in her left hand maybe over the last year or even a little longer. No fam hx of tremor.     ROS    Objective:     BP (!) 98/46   Pulse 81   Ht 5\' 1"  (1.549 m)   LMP 12/18/2013   SpO2 94%   BMI 27.21 kg/m    Physical Exam Vitals and nursing note reviewed.  Constitutional:      Appearance: Normal appearance.  HENT:     Head: Normocephalic and atraumatic.  Eyes:     Conjunctiva/sclera: Conjunctivae normal.  Cardiovascular:     Rate and Rhythm: Normal rate and regular rhythm.  Pulmonary:     Effort: Pulmonary effort is normal.     Breath sounds: Normal breath sounds.  Skin:    General: Skin is warm and dry.  Neurological:     Mental Status: She is alert.     Comments: High frequency tremor of the left hand, mild on the right.    Psychiatric:        Mood and Affect: Mood normal.      No results found for any visits on 08/23/23.    The ASCVD Risk score (Arnett DK, et al., 2019) failed to calculate for the following reasons:   The valid total cholesterol range is 130 to 320 mg/dL    Assessment & Plan:   Problem List Items Addressed This Visit       Other   Vitamin D deficiency   Relevant Orders   CMP14+EGFR   TSH   B12   Vitamin B1   Fe+TIBC+Fer   Vitamin B6   Magnesium   VITAMIN D 25 Hydroxy (Vit-D Deficiency, Fractures)   Tremor of left hand    Discussed possible diagnoses.  The tremor  itself looks most consistent with an essential tremor.  It does not look like a pill-rolling tremor.  We did discuss that sometimes it can be a side effect of medications.  She has been on the same medicines for quite a long time nothing new or unusual no changes to doses etc.  We did discuss doing some labs just to rule out deficiencies etc.  We can consider neurology consultation.      Relevant Orders   CMP14+EGFR   TSH   B12   Vitamin B1   Fe+TIBC+Fer   Vitamin B6   Magnesium   VITAMIN D 25 Hydroxy (Vit-D Deficiency, Fractures)   Depression with anxiety    Happy with current regimen.  PHQ-9 score of 2 and she answered no to the first 2 questions.  GAD-7 score of 2.  Follow-up in 6 months.      Relevant Medications   escitalopram (LEXAPRO) 10 MG tablet   Other Visit Diagnoses     Abnormal urinalysis    -  Primary   Relevant  Orders   Urine Microalbumin w/creat. ratio   CMP14+EGFR   TSH   B12   Vitamin B1   Fe+TIBC+Fer   Vitamin B6   Magnesium   VITAMIN D 25 Hydroxy (Vit-D Deficiency, Fractures)       Recent urinalysis was positive for protein.  She was having urinary symptoms at the time and like to repeat it today to see if she still has persistent proteinuria.  Return in about 6 months (around 02/21/2024) for Mood.    Nani Gasser, MD

## 2023-08-23 NOTE — Progress Notes (Signed)
Pt reports that she has noticed shaking in her hands mostly in her L. She cannot recall how long this has been going on. She is R hand dominant

## 2023-08-23 NOTE — Assessment & Plan Note (Signed)
Happy with current regimen.  PHQ-9 score of 2 and she answered no to the first 2 questions.  GAD-7 score of 2.  Follow-up in 6 months.

## 2023-08-23 NOTE — Assessment & Plan Note (Signed)
Discussed possible diagnoses.  The tremor itself looks most consistent with an essential tremor.  It does not look like a pill-rolling tremor.  We did discuss that sometimes it can be a side effect of medications.  She has been on the same medicines for quite a long time nothing new or unusual no changes to doses etc.  We did discuss doing some labs just to rule out deficiencies etc.  We can consider neurology consultation.

## 2023-08-24 LAB — MICROALBUMIN / CREATININE URINE RATIO
Creatinine, Urine: 66.5 mg/dL
Microalb/Creat Ratio: <5 mg/g{creat} (ref 0–29)
Microalbumin, Urine: <3 ug/mL

## 2023-08-24 NOTE — Progress Notes (Signed)
Hi Tavia, metabolic panel looks good no excess protein in the urine.  Thyroid level looks perfect.  B12 is normal.  Iron levels overall look good.  Vitamin B1 and B6 still pending.  Magnesium is normal.  Vitamin D looks okay so keep May taking 25 mcg daily.

## 2023-08-25 DIAGNOSIS — M7062 Trochanteric bursitis, left hip: Secondary | ICD-10-CM | POA: Diagnosis not present

## 2023-08-25 DIAGNOSIS — Z96652 Presence of left artificial knee joint: Secondary | ICD-10-CM | POA: Diagnosis not present

## 2023-08-27 LAB — TSH: TSH: 1.11 u[IU]/mL (ref 0.450–4.500)

## 2023-08-27 LAB — IRON,TIBC AND FERRITIN PANEL
Ferritin: 39 ng/mL (ref 15–150)
Iron Saturation: 27 % (ref 15–55)
Iron: 79 ug/dL (ref 27–139)
Total Iron Binding Capacity: 294 ug/dL (ref 250–450)
UIBC: 215 ug/dL (ref 118–369)

## 2023-08-27 LAB — CMP14+EGFR
ALT: 13 [IU]/L (ref 0–32)
AST: 22 [IU]/L (ref 0–40)
Albumin: 4.2 g/dL (ref 3.9–4.9)
Alkaline Phosphatase: 76 [IU]/L (ref 44–121)
BUN/Creatinine Ratio: 24 (ref 12–28)
BUN: 22 mg/dL (ref 8–27)
Bilirubin Total: 0.4 mg/dL (ref 0.0–1.2)
CO2: 24 mmol/L (ref 20–29)
Calcium: 9.4 mg/dL (ref 8.7–10.3)
Chloride: 105 mmol/L (ref 96–106)
Creatinine, Ser: 0.93 mg/dL (ref 0.57–1.00)
Globulin, Total: 2.2 g/dL (ref 1.5–4.5)
Glucose: 57 mg/dL — ABNORMAL LOW (ref 70–99)
Potassium: 4.2 mmol/L (ref 3.5–5.2)
Sodium: 143 mmol/L (ref 134–144)
Total Protein: 6.4 g/dL (ref 6.0–8.5)
eGFR: 68 mL/min/{1.73_m2} (ref >59)

## 2023-08-27 LAB — VITAMIN B1: Thiamine: 114.6 nmol/L (ref 66.5–200.0)

## 2023-08-27 LAB — VITAMIN B12: Vitamin B-12: 482 pg/mL (ref 232–1245)

## 2023-08-27 LAB — MAGNESIUM: Magnesium: 1.9 mg/dL (ref 1.6–2.3)

## 2023-08-27 LAB — VITAMIN B6: Vitamin B6: 15 ug/L (ref 3.4–65.2)

## 2023-08-27 LAB — VITAMIN D 25 HYDROXY (VIT D DEFICIENCY, FRACTURES): Vit D, 25-Hydroxy: 40.7 ng/mL (ref 30.0–100.0)

## 2023-08-28 DIAGNOSIS — M810 Age-related osteoporosis without current pathological fracture: Secondary | ICD-10-CM | POA: Diagnosis not present

## 2023-08-29 NOTE — Progress Notes (Signed)
B1 and B6 look good.

## 2023-09-22 DIAGNOSIS — Z471 Aftercare following joint replacement surgery: Secondary | ICD-10-CM | POA: Diagnosis not present

## 2023-09-22 DIAGNOSIS — Z96652 Presence of left artificial knee joint: Secondary | ICD-10-CM | POA: Diagnosis not present

## 2023-09-27 ENCOUNTER — Telehealth: Payer: Self-pay | Admitting: *Deleted

## 2023-09-27 NOTE — Telephone Encounter (Signed)
Surgical clearance form faxed and confirmation received and scanned into chart.

## 2023-10-04 ENCOUNTER — Encounter: Payer: Self-pay | Admitting: Physician Assistant

## 2023-10-04 ENCOUNTER — Ambulatory Visit: Payer: Medicare Other | Admitting: Physician Assistant

## 2023-10-04 VITALS — BP 107/62 | HR 83 | Temp 97.8°F | Ht 61.0 in

## 2023-10-04 DIAGNOSIS — J0140 Acute pansinusitis, unspecified: Secondary | ICD-10-CM | POA: Diagnosis not present

## 2023-10-04 DIAGNOSIS — R6889 Other general symptoms and signs: Secondary | ICD-10-CM | POA: Diagnosis not present

## 2023-10-04 DIAGNOSIS — J029 Acute pharyngitis, unspecified: Secondary | ICD-10-CM

## 2023-10-04 LAB — POCT INFLUENZA A/B
Influenza A, POC: NEGATIVE
Influenza B, POC: NEGATIVE

## 2023-10-04 LAB — POCT RAPID STREP A (OFFICE): Rapid Strep A Screen: NEGATIVE

## 2023-10-04 MED ORDER — AMOXICILLIN-POT CLAVULANATE 875-125 MG PO TABS: 1.0000 | ORAL_TABLET | Freq: Two times a day (BID) | ORAL | 0 refills | Status: DC

## 2023-10-04 NOTE — Progress Notes (Signed)
Established Patient Office Visit  Subjective   Patient ID: Wanda Gonzales, female    DOB: 01-Dec-1955  Age: 67 y.o. MRN: [id number]  Chief Complaint  Patient presents with   Cough    Bi-lat ear pain and sinus drainage and sore throat, pt states she took a covid test at home and it resulted neg.    HPI  Pt is a 68 yo female coming in to the clinic with a chief complaint of a sore throat x 1 week, and a cough for 2-3 days and some slight sinus congestion. She is also complaining of bilateral ear pain, but says that it is worse on the left side. Pt says "she has been more prone to really bad sore throats her whole life". She claims she is a "mouth breather" at night and that it often makes her throat dry. She currently rates her throat pain a 7/10 and says that she has taken Tylenol and used throat lozenges for the pain but it has not provided much relief. She did mention that she had a tonsillectomy at the age of 16.  Bilateral ear pain that is worse on the left side. Rates the pain about 5/10. Denies any drainage from either ear, tinnitus or hearing loss. She did mention that she does have hearing aides that she uses.   She is also complaining of a dry cough for the last 2-3 days that is worse at night and some clear rhinorrhea.   Review of Systems  Constitutional:  Negative for chills and fever.  HENT:  Positive for congestion, ear pain and sore throat. Negative for ear discharge, hearing loss and tinnitus.   Respiratory:  Positive for cough.   Gastrointestinal:  Negative for nausea and vomiting.  All other systems reviewed and are negative.  Active Ambulatory Problems    Diagnosis Date Noted   Hyperlipidemia 09/17/2007   Attention deficit disorder 09/11/2007   UNSPECIFIED VENOUS INSUFFICIENCY 08/29/2007   CERVICAL POLYP 09/17/2007   INSOMNIA 04/13/2010   Trochanteric bursitis of right hip 02/29/2012   Vitamin D deficiency 03/18/2013   DDD (degenerative disc disease), lumbar  02/17/2014   Cervical strain 11/21/2014   Mid back pain, chronic 12/16/2015   S/P TKR (total knee replacement), left 08/25/2016   Benign paroxysmal positional vertigo 08/25/2016   Seborrheic keratosis 11/23/2017   Rhinitis 03/18/2020   Age-related osteoporosis without current pathological fracture 07/29/2020   Osteoporosis 07/27/2020   Depression with anxiety    History of osteoporosis    Frequent falls 11/26/2020   Hearing aid worn 02/10/2022   IBS (irritable bowel syndrome with diarrhea) 08/17/2022   Pain in left knee 09/07/2018   Trochanteric bursitis of left hip 02/06/2022   Irritant contact dermatitis 02/20/2023   Pelvic pressure in female 07/14/2023   Proteinuria 07/14/2023   Pelvic pain 07/14/2023   Tremor of left hand 08/23/2023   Resolved Ambulatory Problems    Diagnosis Date Noted   MENOPAUSAL DISORDER 12/13/2007   CHEST PAIN, ATYPICAL 11/10/2010   Abdominal pain, generalized 11/10/2010   Baker's cyst 03/18/2013   Right shoulder pain 05/15/2014   Fall 12/16/2015   Baker's cyst of knee, left 08/25/2016   Closed displaced fracture of fifth metatarsal bone of left foot 03/31/2017   Skin tag 11/29/2017   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   Dysfunction of Eustachian tube, left 09/24/2020   Past Medical History:  Diagnosis Date   Abnormal Pap smear, atypical squamous cells of undetermined sign (ASC-US) 1998   ADD (attention  deficit disorder) 02-23-12   Arthritis 02-23-12   Kidney calculi 07/2015   Kidney stone    PONV (postoperative nausea and vomiting) 02-23-12   Post-nasal drainage 02-23-12   Urethral polyp 1998   Varicose veins    Venous insufficiency      Objective:     BP 107/62   Pulse 83   Temp 97.8 F (36.6 C) (Oral)   Ht 5\' 1"  (1.549 m)   LMP 12/18/2013   SpO2 97%   BMI 27.21 kg/m  BP Readings from Last 3 Encounters:  10/04/23 107/62  08/23/23 (!) 98/46  08/03/23 106/72    .Marland Kitchen Results for orders placed or performed in visit on 10/04/23  POCT  Influenza A/B   Collection Time: 10/04/23  1:45 PM  Result Value Ref Range   Influenza A, POC Negative Negative   Influenza B, POC Negative Negative  POCT rapid strep A   Collection Time: 10/04/23  1:45 PM  Result Value Ref Range   Rapid Strep A Screen Negative Negative    Physical Exam Constitutional:      Appearance: Normal appearance.  HENT:     Head: Normocephalic.     Right Ear: Tympanic membrane normal.     Left Ear: Tympanic membrane normal.     Nose: Congestion present.     Mouth/Throat:     Mouth: Mucous membranes are moist.  Neck:     Comments: Anterior cervical lymphadenopathy (left side) and TTP Cardiovascular:     Rate and Rhythm: Normal rate and regular rhythm.     Pulses: Normal pulses.     Heart sounds: Normal heart sounds.  Pulmonary:     Effort: Pulmonary effort is normal.     Breath sounds: Normal breath sounds.  Skin:    General: Skin is warm and dry.  Neurological:     General: No focal deficit present.     Mental Status: She is alert and oriented to person, place, and time.  Psychiatric:        Mood and Affect: Mood normal.        Behavior: Behavior normal.      Assessment & Plan:   Wanda Gonzales was seen today for cough.  Diagnoses and all orders for this visit:  Acute non-recurrent pansinusitis -     amoxicillin-clavulanate (AUGMENTIN) 875-125 MG tablet; Take 1 tablet by mouth 2 (two) times daily.  Flu-like symptoms -     POCT Influenza A/B -     POCT rapid strep A  Sore throat -     POCT rapid strep A   Flu and strep tests negative Start Augmentin for sinusitis Try to drink more warm fluids such as tea with honey to help soothe sore throat Use tylenol as needed for sore throat OTC cough syrup for coughing at night  Follow up if symptoms do not improve, or worsen  Tandy Gaw, PA-C

## 2023-10-04 NOTE — Patient Instructions (Addendum)
 Sent augmentin  for 10 days.   Sore Throat A sore throat is pain, burning, irritation, or scratchiness in the throat. When you have a sore throat, you may feel pain or tenderness in your throat when you swallow or talk. Many things can cause a sore throat, including: An infection. Seasonal allergies. Dryness in the air. Irritants, such as smoke or pollution. Radiation treatment for cancer. Gastroesophageal reflux disease (GERD). A tumor. A sore throat is often the first sign of another sickness. It may happen with other symptoms, such as coughing, sneezing, fever, and swollen neck glands. Most sore throats go away without medical treatment. Follow these instructions at home:     Medicines Take over-the-counter and prescription medicines only as told by your health care provider. Children often get sore throats. Do not give your child aspirin  because of the association with Reye's syndrome. Use throat sprays to soothe your throat as told by your health care provider. Managing pain To help with pain, try: Sipping warm liquids, such as broth, herbal tea, or warm water. Eating or drinking cold or frozen liquids, such as frozen ice pops. Gargling with a mixture of salt and water 3-4 times a day or as needed. To make salt water, completely dissolve -1 tsp (3-6 g) of salt in 1 cup (237 mL) of warm water. Sucking on hard candy or throat lozenges. Putting a cool-mist humidifier in your bedroom at night to moisten the air. Sitting in the bathroom with the door closed for 5-10 minutes while you run hot water in the shower. General instructions Do not use any products that contain nicotine or tobacco. These products include cigarettes, chewing tobacco, and vaping devices, such as e-cigarettes. If you need help quitting, ask your health care provider. Rest as needed. Drink enough fluid to keep your urine pale yellow. Wash your hands often with soap and water for at least 20 seconds. If soap and  water are not available, use hand sanitizer. Contact a health care provider if: You have a fever for more than 2-3 days. You have symptoms that last for more than 2-3 days. Your throat does not get better within 7 days. You have a fever and your symptoms suddenly get worse. Get help right away if: You have difficulty breathing. You cannot swallow fluids, soft foods, or your saliva. You have increased swelling in your throat or neck. You have persistent nausea and vomiting. These symptoms may represent a serious problem that is an emergency. Do not wait to see if the symptoms will go away. Get medical help right away. Call your local emergency services (911 in the [place] Do not drive yourself to the hospital. Summary A sore throat is pain, burning, irritation, or scratchiness in the throat. Many things can cause a sore throat. Take over-the-counter medicines only as told by your health care provider. Rest as needed. Drink enough fluid to keep your urine pale yellow. Contact a health care provider if your throat does not get better within 7 days. This information is not intended to replace advice given to you by your health care provider. Make sure you discuss any questions you have with your health care provider. Document Revised: 12/02/2020 Document Reviewed: 12/02/2020 Elsevier Patient Education  2024 [employer].

## 2023-10-06 ENCOUNTER — Encounter: Payer: Self-pay | Admitting: Physician Assistant

## 2023-11-13 ENCOUNTER — Other Ambulatory Visit: Payer: Self-pay | Admitting: Family Medicine

## 2023-11-13 DIAGNOSIS — Z1231 Encounter for screening mammogram for malignant neoplasm of breast: Secondary | ICD-10-CM

## 2023-11-15 ENCOUNTER — Ambulatory Visit: Payer: Medicare Other

## 2023-11-15 DIAGNOSIS — Z1231 Encounter for screening mammogram for malignant neoplasm of breast: Secondary | ICD-10-CM | POA: Diagnosis not present

## 2023-11-17 ENCOUNTER — Encounter: Payer: Self-pay | Admitting: Family Medicine

## 2023-11-17 ENCOUNTER — Ambulatory Visit: Payer: Medicare Other

## 2023-11-17 NOTE — Progress Notes (Signed)
 Please call patient. Normal mammogram.  Repeat in 1 year.

## 2023-11-29 DIAGNOSIS — M25562 Pain in left knee: Secondary | ICD-10-CM | POA: Diagnosis not present

## 2023-11-29 DIAGNOSIS — Z96652 Presence of left artificial knee joint: Secondary | ICD-10-CM | POA: Diagnosis not present

## 2023-11-29 DIAGNOSIS — M25561 Pain in right knee: Secondary | ICD-10-CM | POA: Diagnosis not present

## 2023-11-29 NOTE — H&P (Signed)
 TOTAL KNEE ADMISSION H&P  Patient is being admitted for left total knee arthroplasty.  Subjective:  Chief Complaint: Left knee pain.  HPI: Wanda Gonzales is a 68 year old female presenting for a recheck of her left knee pain. She has hx of left TKA and subsequent left knee synovectomy. She reports persistent pain in her left knee, particularly when kneeling or getting on the floor. The pain is localized to two specific spots on her knee. She has previously tried gabapentin, which was not effective and caused significant drowsiness. She also used a knee brace with hinges, which helped stabilize the knee but did not alleviate the discomfort. The patient describes her knee as hyperextending and feeling unstable. She has had 2 bone scans that both came back normal  There is no active infection.  Patient Active Problem List   Diagnosis Date Noted   Tremor of left hand 08/23/2023   Pelvic pressure in female 07/14/2023   Proteinuria 07/14/2023   Pelvic pain 07/14/2023   Irritant contact dermatitis 02/20/2023   IBS (irritable bowel syndrome with diarrhea) 08/17/2022   Hearing aid worn 02/10/2022   Trochanteric bursitis of left hip 02/06/2022   Frequent falls 11/26/2020   Depression with anxiety    History of osteoporosis    Age-related osteoporosis without current pathological fracture 07/29/2020   Osteoporosis 07/27/2020   Rhinitis 03/18/2020   Pain in left knee 09/07/2018   Seborrheic keratosis 11/23/2017   S/P TKR (total knee replacement), left 08/25/2016   Benign paroxysmal positional vertigo 08/25/2016   Mid back pain, chronic 12/16/2015   Cervical strain 11/21/2014   DDD (degenerative disc disease), lumbar 02/17/2014   Vitamin D deficiency 03/18/2013   Trochanteric bursitis of right hip 02/29/2012   INSOMNIA 04/13/2010   Hyperlipidemia 09/17/2007   CERVICAL POLYP 09/17/2007   Attention deficit disorder 09/11/2007   UNSPECIFIED VENOUS INSUFFICIENCY 08/29/2007    Past Medical  History:  Diagnosis Date   Abnormal Pap smear, atypical squamous cells of undetermined sign (ASC-US) 1998   treated with cryo, CIN I   ADD (attention deficit disorder) 02-23-12   easily distracted.   Arthritis 02-23-12   Rt. hip and lt. arm   Closed displaced fracture of fifth metatarsal bone of left foot 03/31/2017   Depression with anxiety    History of osteoporosis    Kidney calculi 07/2015   Kidney stone    PONV (postoperative nausea and vomiting) 02-23-12   always has PONV   Post-nasal drainage 02-23-12   frequent a problem,causes a cough and mucus buildup in throat   Urethral polyp 1998   Dr. Marcello Fennel   Varicose veins    Venous insufficiency    chronic-denies any problems    Past Surgical History:  Procedure Laterality Date   blood clot removed Left 01/2012   blood clot removed from left hand    BREAST BIOPSY Right 02/14/2012   Fibroadenoma   COLONOSCOPY  08/2003   repeat 5 yrs.   COLONOSCOPY W/ BIOPSIES  12/2008   1 polyp recheck 5 yrs   EXCISION/RELEASE BURSA HIP  02/29/2012   HAND SURGERY  02/23/2012   "blood clot" excised palm left hand 01-18-12   KNEE ARTHROSCOPY W/ MENISCAL REPAIR  10/2012   KNEE ARTHROSCOPY WITH EXCISION BAKER'S CYST Left 10/2012   meniscal repair   ORIF METATARSAL FRACTURE Left 04/07/2017   Left 5th Metatarsal   SHOULDER SURGERY Right 02/2015   Dr. 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 Authorizing Provider  alendronate (FOSAMAX) 70 MG tablet Take 70 mg by mouth once a week. 07/27/20   [provider]  amoxicillin-clavulanate (AUGMENTIN) 875-125 MG tablet Take 1 tablet by mouth 2 (two) times daily. 10/04/23   Breeback, Jade L, PA-C  escitalopram (LEXAPRO) 10 MG tablet Take 1 tablet (10 mg total) by mouth daily. 08/23/23   Agapito Games, MD  hyoscyamine (LEVBID) 0.375 MG 12  hr tablet Take 0.375 mg by mouth as needed for cramping. 06/29/22   [provider]  Multiple Vitamin (MULTIVITAMIN) tablet Take 1 tablet by mouth daily.    [provider]  omeprazole (PRILOSEC) 40 MG capsule TAKE 1 CAPSULE(40 MG) BY MOUTH DAILY BEFORE BREAKFAST 09/27/22   [provider]  rosuvastatin (CRESTOR) 10 MG tablet Take 1 tablet (10 mg total) by mouth at bedtime. 02/25/23   Agapito Games, MD  VITAMIN D PO Take by mouth daily.    [provider]    Allergies  Allergen Reactions   Codeine Other (See Comments)    REACTION: nausea and dizzy   Doxycycline Nausea Only   Concerta [Methylphenidate] Hives and Rash   Sulfa Antibiotics Nausea Only    Social History   Socioeconomic History   Marital status: Married    Spouse name: Ronaldo Miyamoto   Number of children: 0   Years of education: 16   Highest education level: Bachelor's degree (e.g., BA, AB, BS)  Occupational History   Occupation: works part-time (two days a week)  Tobacco Use   Smoking status: Never   Smokeless tobacco: Never  Vaping Use   Vaping status: Never Used  Substance and Sexual Activity   Alcohol use: Yes    Alcohol/week: 1.0 standard drink of alcohol    Types: 1 Glasses of wine per week    Comment: rare occ.   Drug use: No   Sexual activity: Not Currently    Birth control/protection: Post-menopausal  Other Topics Concern   Not on file  Social History Narrative   Lives with spouse. She does not have any children. She still works part-time. She enjoys travelling, travelling and art.   Social Drivers of Corporate investment banker Strain: Low Risk  (07/13/2023)   Overall Financial Resource Strain (CARDIA)    Difficulty of Paying Living Expenses: Not hard at all  Food Insecurity: No Food Insecurity (07/13/2023)   Hunger Vital Sign    Worried About Running Out of Food in the Last Year: Never true    Ran Out of Food in the Last Year: Never true  Transportation Needs: No  Transportation Needs (07/13/2023)   PRAPARE - Administrator, Civil Service (Medical): No    Lack of Transportation (Non-Medical): No  Physical Activity: Insufficiently Active (07/13/2023)   Exercise Vital Sign    Days of Exercise per Week: 3 days    Minutes of Exercise per Session: 30 min  Stress: No Stress Concern Present (07/13/2023)   [employer] of Occupational Health - Occupational Stress Questionnaire    Feeling of Stress : Not at all  Social Connections: Moderately Integrated (07/13/2023)   Social Connection and Isolation Panel [NHANES]    Frequency of Communication with Friends and Family: Three times a week    Frequency of Social Gatherings with Friends and Family: Once a week    Attends Religious Services: More  than 4 times per year    Active Member of Clubs or Organizations: No    Attends Banker Meetings: Never    Marital Status: Married  Catering manager Violence: Not At Risk (07/12/2023)   Humiliation, Afraid, Rape, and Kick questionnaire    Fear of Current or Ex-Partner: No    Emotionally Abused: No    Physically Abused: No    Sexually Abused: No    Tobacco Use: Low Risk  (10/06/2023)   Patient History    Smoking Tobacco Use: Never    Smokeless Tobacco Use: Never    Passive Exposure: Not on file   Social History   Substance and Sexual Activity  Alcohol Use Yes   Alcohol/week: 1.0 standard drink of alcohol   Types: 1 Glasses of wine per week   Comment: rare occ.    Family History  Problem Relation Age of Onset   Breast cancer Mother 35       breast   Hyperlipidemia Mother    Brain cancer Father 33       brain   Osteoporosis Maternal Grandmother    Drug abuse Brother     ROS  Objective:  Physical Exam: - Well-developed female, alert, oriented, in no apparent distress.    - Left knee: No warmth or effusion. Range of motion demonstrates hyperextension of approximately 10 degrees and flexion of 125 degrees. Varus  valgus laxity in extension and AP laxity in flexion. No gross instability.    - Gait pattern is normal  IMAGING:    The bone scan of the left knee shows no evidence of any loosening or infection. The scan is completely normal, indicating that there is no loosening or infection in the knee.  Assessment/Plan:  End stage arthritis, left knee   The patient history, physical examination, clinical judgment of the provider and imaging studies are consistent with end stage degenerative joint disease of the left knee and total knee arthroplasty is deemed medically necessary. The treatment options including medical management, injection therapy arthroscopy and arthroplasty were discussed at length. The risks and benefits of total knee arthroplasty were presented and reviewed. The risks due to aseptic loosening, infection, stiffness, patella tracking problems, thromboembolic complications and other imponderables were discussed. The patient acknowledged the explanation, agreed to proceed with the plan and consent was signed. Patient is being admitted for inpatient treatment for surgery, pain control, PT, OT, prophylactic antibiotics, VTE prophylaxis, progressive ambulation and ADLs and discharge planning. The patient is planning to be discharged home.  Anticipated LOS equal to or greater than 2 midnights due to - Age 53 and older with one or more of the following:  - Obesity  - Expected need for hospital services (PT, OT, Nursing) required for safe  discharge  - Anticipated need for postoperative skilled nursing care or inpatient rehab  - Active co-morbidities: None OR   - Unanticipated findings during/Post Surgery: Slow post-op progression: GI, pain control, mobility  - Patient is a high risk of re-admission due to: None  Therapy Plans: [facility] in [place] Disposition: Home with husband Planned DVT Prophylaxis: Aspirin 81mg  BID DME Needed: None  PCP: Nani Gasser, MD (clearance  received) TXA: IV Allergies: codeine (dizziness), doxycycline (nausea), methylphenidate (hives), sulfa (nausea) Anesthesia Concerns: N/V years ago BMI: 31.7 Last HgbA1c: Not diabetic Pharmacy: [facility], [address], [place]  Other: -Patient is concerned that her knee high compression will be too tight   - Patient was instructed on what medications to stop prior  to surgery. - Follow-up visit in 2 weeks with Dr. Lequita Halt - Begin physical therapy following surgery - Pre-operative lab work as pre-surgical testing - Prescriptions will be provided in hospital at time of discharge  Weston Brass, PA-C Orthopedic Surgery [facility] [place]

## 2023-12-04 NOTE — Patient Instructions (Signed)
 SURGICAL WAITING ROOM VISITATION  Patients having surgery or a procedure may have no more than 2 support people in the waiting area - these visitors may rotate.    Children under the age of 87 must have an adult with them who is not the patient.  Due to an increase in RSV and influenza rates and associated hospitalizations, children ages 35 and under may not visit patients in [facility] hospitals.  Visitors with respiratory illnesses are discouraged from visiting and should remain at home.  If the patient needs to stay at the hospital during part of their recovery, the visitor guidelines for inpatient rooms apply. Pre-op nurse will coordinate an appropriate time for 1 support person to accompany patient in pre-op.  This support person may not rotate.    Please refer to the [facility] website for the visitor guidelines for Inpatients (after your surgery is over and you are in a regular room).       Your procedure is scheduled on:  12/20/2023    Report to [facility] Main Entrance    Report to admitting at   0930AM   Call this number if you have problems the morning of surgery [phone]   Do not eat food :After Midnight.   After Midnight you may have the following liquids until __ 0900____ AMDAY OF SURGERY  Water Non-Citrus Juices (without pulp, NO RED-Apple, White grape, White cranberry) Black Coffee (NO MILK/CREAM OR CREAMERS, sugar ok)  Clear Tea (NO MILK/CREAM OR CREAMERS, sugar ok) regular and decaf                             Plain Jell-O (NO RED)                                           Fruit ices (not with fruit pulp, NO RED)                                     Popsicles (NO RED)                                                               Sports drinks like Gatorade (NO RED)                     The day of surgery:  Drink ONE (1) Pre-Surgery Clear Ensure or G2 at   0900AM  ( have completed by ) the morning of surgery. Drink in one sitting. Do not sip.   This drink was given to you during your hospital  pre-op appointment visit. Nothing else to drink after completing the  Pre-Surgery Clear Ensure or G2.          If you have questions, please contact your surgeon's office.       Oral Hygiene is also important to reduce your risk of infection.  Remember - BRUSH YOUR TEETH THE MORNING OF SURGERY WITH YOUR REGULAR TOOTHPASTE  DENTURES WILL BE REMOVED PRIOR TO SURGERY PLEASE DO NOT APPLY "Poly grip" OR ADHESIVES!!!   Do NOT smoke after Midnight   Stop all vitamins and herbal supplements 7 days before surgery.   Take these medicines the morning of surgery with A SIP OF WATER:  lexapro, omeprazole   DO NOT TAKE ANY ORAL DIABETIC MEDICATIONS DAY OF YOUR SURGERY  Bring CPAP mask and tubing day of surgery.                              You may not have any metal on your body including hair pins, jewelry, and body piercing             Do not wear make-up, lotions, powders, perfumes/cologne, or deodorant  Do not wear nail polish including gel and S&S, artificial/acrylic nails, or any other type of covering on natural nails including finger and toenails. If you have artificial nails, gel coating, etc. that needs to be removed by a nail salon please have this removed prior to surgery or surgery may need to be canceled/ delayed if the surgeon/ anesthesia feels like they are unable to be safely monitored.   Do not shave  48 hours prior to surgery.               Men may shave face and neck.   Do not bring valuables to the hospital. [facility] IS NOT             RESPONSIBLE   FOR VALUABLES.   Contacts, glasses, dentures or bridgework may not be worn into surgery.   Bring small overnight bag day of surgery.   DO NOT BRING YOUR HOME MEDICATIONS TO THE HOSPITAL. PHARMACY WILL DISPENSE MEDICATIONS LISTED ON YOUR MEDICATION LIST TO YOU DURING YOUR ADMISSION IN THE HOSPITAL!    Patients discharged on the day  of surgery will not be allowed to drive home.  Someone NEEDS to stay with you for the first 24 hours after anesthesia.   Special Instructions: Bring a copy of your healthcare power of attorney and living will documents the day of surgery if you haven't scanned them before.              Please read over the following fact sheets you were given: IF YOU HAVE QUESTIONS ABOUT YOUR PRE-OP INSTRUCTIONS PLEASE CALL [phone]   If you received a COVID test during your pre-op visit  it is requested that you wear a mask when out in public, stay away from anyone that may not be feeling well and notify your surgeon if you develop symptoms. If you test positive for Covid or have been in contact with anyone that has tested positive in the last 10 days please notify you surgeon.      Pre-operative 5 CHG Bath Instructions   You can play a key role in reducing the risk of infection after surgery. Your skin needs to be as free of germs as possible. You can reduce the number of germs on your skin by washing with CHG (chlorhexidine gluconate) soap before surgery. CHG is an antiseptic soap that kills germs and continues to kill germs even after washing.   DO NOT use if you have an allergy to chlorhexidine/CHG or antibacterial soaps. If your skin becomes reddened or irritated, stop using the CHG and notify one of our RNs  at [phone].   Please shower with the CHG soap starting 4 days before surgery using the following schedule:     Please keep in mind the following:  DO NOT shave, including legs and underarms, starting the day of your first shower.   You may shave your face at any point before/day of surgery.  Place clean sheets on your bed the day you start using CHG soap. Use a clean washcloth (not used since being washed) for each shower. DO NOT sleep with pets once you start using the CHG.   CHG Shower Instructions:  If you choose to wash your hair and private area, wash first with your normal  shampoo/soap.  After you use shampoo/soap, rinse your hair and body thoroughly to remove shampoo/soap residue.  Turn the water OFF and apply about 3 tablespoons (45 ml) of CHG soap to a CLEAN washcloth.  Apply CHG soap ONLY FROM YOUR NECK DOWN TO YOUR TOES (washing for 3-5 minutes)  DO NOT use CHG soap on face, private areas, open wounds, or sores.  Pay special attention to the area where your surgery is being performed.  If you are having back surgery, having someone wash your back for you may be helpful. Wait 2 minutes after CHG soap is applied, then you may rinse off the CHG soap.  Pat dry with a clean towel  Put on clean clothes/pajamas   If you choose to wear lotion, please use ONLY the CHG-compatible lotions on the back of this paper.     Additional instructions for the day of surgery: DO NOT APPLY any lotions, deodorants, cologne, or perfumes.   Put on clean/comfortable clothes.  Brush your teeth.  Ask your nurse before applying any prescription medications to the skin.      CHG Compatible Lotions   Aveeno Moisturizing lotion  Cetaphil Moisturizing Cream  Cetaphil Moisturizing Lotion  Clairol Herbal Essence Moisturizing Lotion, Dry Skin  Clairol Herbal Essence Moisturizing Lotion, Extra Dry Skin  Clairol Herbal Essence Moisturizing Lotion, Normal Skin  Curel Age Defying Therapeutic Moisturizing Lotion with Alpha Hydroxy  Curel Extreme Care Body Lotion  Curel Soothing Hands Moisturizing Hand Lotion  Curel Therapeutic Moisturizing Cream, Fragrance-Free  Curel Therapeutic Moisturizing Lotion, Fragrance-Free  Curel Therapeutic Moisturizing Lotion, Original Formula  Eucerin Daily Replenishing Lotion  Eucerin Dry Skin Therapy Plus Alpha Hydroxy Crme  Eucerin Dry Skin Therapy Plus Alpha Hydroxy Lotion  Eucerin Original Crme  Eucerin Original Lotion  Eucerin Plus Crme Eucerin Plus Lotion  Eucerin TriLipid Replenishing Lotion  Keri Anti-Bacterial Hand Lotion  Keri Deep  Conditioning Original Lotion Dry Skin Formula Softly Scented  Keri Deep Conditioning Original Lotion, Fragrance Free Sensitive Skin Formula  Keri Lotion Fast Absorbing Fragrance Free Sensitive Skin Formula  Keri Lotion Fast Absorbing Softly Scented Dry Skin Formula  Keri Original Lotion  Keri Skin Renewal Lotion Keri Silky Smooth Lotion  Keri Silky Smooth Sensitive Skin Lotion  Nivea Body Creamy Conditioning Oil  Nivea Body Extra Enriched Teacher, adult education Moisturizing Lotion Nivea Crme  Nivea Skin Firming Lotion  NutraDerm 30 Skin Lotion  NutraDerm Skin Lotion  NutraDerm Therapeutic Skin Cream  NutraDerm Therapeutic Skin Lotion  ProShield Protective Hand Cream  Provon moisturizing lotion

## 2023-12-04 NOTE — Progress Notes (Signed)
 Anesthesia Review:  PCP: Tandy Gaw,  10/04/23  Cardiologist :  PPM/ ICD: Device Orders: Rep Notified:  Chest x-ray : EKG : Echo : Stress test: Cardiac Cath :   Activity level:  Sleep Study/ CPAP : Fasting Blood Sugar :      / Checks Blood Sugar -- times a day:    Blood Thinner/ Instructions /Last Dose: ASA / Instructions/ Last Dose :

## 2023-12-06 ENCOUNTER — Other Ambulatory Visit: Payer: Self-pay

## 2023-12-06 ENCOUNTER — Encounter (HOSPITAL_COMMUNITY)
Admission: RE | Admit: 2023-12-06 | Discharge: 2023-12-06 | Disposition: A | Discharge status: Home / Self Care | Payer: Medicare Other | Source: Ambulatory Visit | Attending: Orthopedic Surgery | Admitting: Orthopedic Surgery

## 2023-12-06 ENCOUNTER — Encounter (HOSPITAL_COMMUNITY): Payer: Self-pay

## 2023-12-06 VITALS — BP 104/72 | HR 71 | Temp 98.6°F | Resp 16 | Ht 60.0 in | Wt 162.0 lb

## 2023-12-06 DIAGNOSIS — Z01812 Encounter for preprocedural laboratory examination: Secondary | ICD-10-CM | POA: Diagnosis not present

## 2023-12-06 DIAGNOSIS — Z01818 Encounter for other preprocedural examination: Secondary | ICD-10-CM

## 2023-12-06 HISTORY — DX: Personal history of urinary calculi: Z87.442

## 2023-12-06 HISTORY — DX: Gastro-esophageal reflux disease without esophagitis: K21.9

## 2023-12-06 LAB — TYPE AND SCREEN
ABO/RH(D): A POS
Antibody Screen: NEGATIVE

## 2023-12-06 LAB — CBC
HCT: 43.5 % (ref 36.0–46.0)
Hemoglobin: 13.5 g/dL (ref 12.0–15.0)
MCH: 29.4 pg (ref 26.0–34.0)
MCHC: 31 g/dL (ref 30.0–36.0)
MCV: 94.8 fL (ref 80.0–100.0)
Platelets: 298 10*3/uL (ref 150–400)
RBC: 4.59 MIL/uL (ref 3.87–5.11)
RDW: 12.9 % (ref 11.5–15.5)
WBC: 8.3 10*3/uL (ref 4.0–10.5)
nRBC: 0 % (ref 0.0–0.2)

## 2023-12-06 LAB — BASIC METABOLIC PANEL
Anion gap: 9 (ref 5–15)
BUN: 25 mg/dL — ABNORMAL HIGH (ref 8–23)
CO2: 23 mmol/L (ref 22–32)
Calcium: 9.2 mg/dL (ref 8.9–10.3)
Chloride: 105 mmol/L (ref 98–111)
Creatinine, Ser: 0.86 mg/dL (ref 0.44–1.00)
GFR, Estimated: >60 mL/min (ref >60)
Glucose, Bld: 84 mg/dL (ref 70–99)
Potassium: 4.1 mmol/L (ref 3.5–5.1)
Sodium: 137 mmol/L (ref 135–145)

## 2023-12-06 LAB — SURGICAL PCR SCREEN
MRSA, PCR: NEGATIVE
Staphylococcus aureus: NEGATIVE

## 2023-12-11 DIAGNOSIS — H524 Presbyopia: Secondary | ICD-10-CM | POA: Diagnosis not present

## 2023-12-20 ENCOUNTER — Encounter (HOSPITAL_COMMUNITY): Payer: Self-pay | Admitting: Orthopedic Surgery

## 2023-12-20 ENCOUNTER — Inpatient Hospital Stay (HOSPITAL_COMMUNITY)
Admission: RE | Admit: 2023-12-20 | Discharge: 2023-12-21 | DRG: 489 | Disposition: A | Discharge status: Home / Self Care | Payer: Medicare Other | Attending: Orthopedic Surgery | Admitting: Orthopedic Surgery

## 2023-12-20 ENCOUNTER — Other Ambulatory Visit: Payer: Self-pay

## 2023-12-20 ENCOUNTER — Inpatient Hospital Stay (HOSPITAL_COMMUNITY): Payer: Self-pay

## 2023-12-20 ENCOUNTER — Encounter (HOSPITAL_COMMUNITY)
Admission: RE | Disposition: A | Discharge status: Home / Self Care | Payer: Self-pay | Source: Home / Self Care | Attending: Orthopedic Surgery

## 2023-12-20 ENCOUNTER — Inpatient Hospital Stay (HOSPITAL_COMMUNITY): Payer: Self-pay | Admitting: Physician Assistant

## 2023-12-20 DIAGNOSIS — F419 Anxiety disorder, unspecified: Secondary | ICD-10-CM | POA: Diagnosis present

## 2023-12-20 DIAGNOSIS — F32A Depression, unspecified: Secondary | ICD-10-CM | POA: Diagnosis present

## 2023-12-20 DIAGNOSIS — Z87442 Personal history of urinary calculi: Secondary | ICD-10-CM | POA: Diagnosis not present

## 2023-12-20 DIAGNOSIS — K219 Gastro-esophageal reflux disease without esophagitis: Secondary | ICD-10-CM | POA: Diagnosis not present

## 2023-12-20 DIAGNOSIS — Z803 Family history of malignant neoplasm of breast: Secondary | ICD-10-CM

## 2023-12-20 DIAGNOSIS — Z888 Allergy status to other drugs, medicaments and biological substances status: Secondary | ICD-10-CM | POA: Diagnosis not present

## 2023-12-20 DIAGNOSIS — T84018A Broken internal joint prosthesis, other site, initial encounter: Principal | ICD-10-CM

## 2023-12-20 DIAGNOSIS — T84023A Instability of internal left knee prosthesis, initial encounter: Principal | ICD-10-CM | POA: Diagnosis present

## 2023-12-20 DIAGNOSIS — M810 Age-related osteoporosis without current pathological fracture: Secondary | ICD-10-CM | POA: Diagnosis present

## 2023-12-20 DIAGNOSIS — Z881 Allergy status to other antibiotic agents status: Secondary | ICD-10-CM

## 2023-12-20 DIAGNOSIS — Z96659 Presence of unspecified artificial knee joint: Principal | ICD-10-CM

## 2023-12-20 DIAGNOSIS — Z885 Allergy status to narcotic agent status: Secondary | ICD-10-CM

## 2023-12-20 DIAGNOSIS — E785 Hyperlipidemia, unspecified: Secondary | ICD-10-CM | POA: Diagnosis present

## 2023-12-20 DIAGNOSIS — Z8262 Family history of osteoporosis: Secondary | ICD-10-CM

## 2023-12-20 DIAGNOSIS — Z7983 Long term (current) use of bisphosphonates: Secondary | ICD-10-CM

## 2023-12-20 DIAGNOSIS — Z96642 Presence of left artificial hip joint: Secondary | ICD-10-CM | POA: Diagnosis not present

## 2023-12-20 DIAGNOSIS — Z808 Family history of malignant neoplasm of other organs or systems: Secondary | ICD-10-CM

## 2023-12-20 DIAGNOSIS — Z87410 Personal history of cervical dysplasia: Secondary | ICD-10-CM

## 2023-12-20 DIAGNOSIS — Z813 Family history of other psychoactive substance abuse and dependence: Secondary | ICD-10-CM

## 2023-12-20 DIAGNOSIS — Z83438 Family history of other disorder of lipoprotein metabolism and other lipidemia: Secondary | ICD-10-CM | POA: Diagnosis not present

## 2023-12-20 DIAGNOSIS — Z96652 Presence of left artificial knee joint: Secondary | ICD-10-CM

## 2023-12-20 DIAGNOSIS — Y792 Prosthetic and other implants, materials and accessory orthopedic devices associated with adverse incidents: Secondary | ICD-10-CM | POA: Diagnosis not present

## 2023-12-20 DIAGNOSIS — M25562 Pain in left knee: Secondary | ICD-10-CM | POA: Diagnosis not present

## 2023-12-20 DIAGNOSIS — Z882 Allergy status to sulfonamides status: Secondary | ICD-10-CM | POA: Diagnosis not present

## 2023-12-20 DIAGNOSIS — Z6831 Body mass index (BMI) 31.0-31.9, adult: Secondary | ICD-10-CM

## 2023-12-20 DIAGNOSIS — E669 Obesity, unspecified: Secondary | ICD-10-CM | POA: Diagnosis present

## 2023-12-20 DIAGNOSIS — E559 Vitamin D deficiency, unspecified: Secondary | ICD-10-CM | POA: Diagnosis not present

## 2023-12-20 DIAGNOSIS — Z01818 Encounter for other preprocedural examination: Secondary | ICD-10-CM

## 2023-12-20 DIAGNOSIS — G8918 Other acute postprocedural pain: Secondary | ICD-10-CM | POA: Diagnosis not present

## 2023-12-20 HISTORY — PX: TOTAL KNEE REVISION: SHX996

## 2023-12-20 LAB — ABO/RH: ABO/RH(D): A POS

## 2023-12-20 SURGERY — TOTAL KNEE REVISION
Anesthesia: Spinal | Site: Knee | Laterality: Left

## 2023-12-20 MED ORDER — DIPHENHYDRAMINE HCL 12.5 MG/5ML PO ELIX: 12.5000 mg | ORAL_SOLUTION | ORAL | Status: DC | PRN

## 2023-12-20 MED ORDER — DEXAMETHASONE SODIUM PHOSPHATE 10 MG/ML IJ SOLN
INTRAMUSCULAR | Status: DC | PRN
Start: 2023-12-20 — End: 2023-12-20
  Administered 2023-12-20: 10 mg

## 2023-12-20 MED ORDER — ONDANSETRON HCL 4 MG/2ML IJ SOLN: 4.0000 mg | Freq: Four times a day (QID) | INTRAMUSCULAR | Status: DC | PRN

## 2023-12-20 MED ORDER — DEXAMETHASONE SODIUM PHOSPHATE 10 MG/ML IJ SOLN
INTRAMUSCULAR | Status: AC
  Filled 2023-12-20: qty 1

## 2023-12-20 MED ORDER — HYDROMORPHONE HCL 1 MG/ML IJ SOLN: 0.5000 mg | INTRAMUSCULAR | Status: DC | PRN

## 2023-12-20 MED ORDER — DOCUSATE SODIUM 100 MG PO CAPS
100.0000 mg | ORAL_CAPSULE | Freq: Two times a day (BID) | ORAL | Status: DC
  Administered 2023-12-20 – 2023-12-21 (×2): 100 mg via ORAL
  Filled 2023-12-20 (×2): qty 1

## 2023-12-20 MED ORDER — BUPIVACAINE IN DEXTROSE 0.75-8.25 % IT SOLN
INTRATHECAL | Status: DC | PRN
  Administered 2023-12-20: 1.6 mL via INTRATHECAL

## 2023-12-20 MED ORDER — LACTATED RINGERS IV SOLN: INTRAVENOUS | Status: DC

## 2023-12-20 MED ORDER — MENTHOL 3 MG MT LOZG: 1.0000 | LOZENGE | OROMUCOSAL | Status: DC | PRN

## 2023-12-20 MED ORDER — ORAL CARE MOUTH RINSE: 15.0000 mL | Freq: Once | OROMUCOSAL | Status: AC

## 2023-12-20 MED ORDER — PROPOFOL 500 MG/50ML IV EMUL
INTRAVENOUS | Status: DC | PRN
Start: 2023-12-20 — End: 2023-12-20
  Administered 2023-12-20: 10 mg via INTRAVENOUS
  Administered 2023-12-20: 20 mg via INTRAVENOUS
  Administered 2023-12-20: 75 ug/kg/min via INTRAVENOUS
  Administered 2023-12-20: 20 mg via INTRAVENOUS

## 2023-12-20 MED ORDER — DEXAMETHASONE SODIUM PHOSPHATE 10 MG/ML IJ SOLN
8.0000 mg | Freq: Once | INTRAMUSCULAR | Status: AC
Start: 2023-12-20 — End: 2023-12-20
  Administered 2023-12-20: 8 mg via INTRAVENOUS

## 2023-12-20 MED ORDER — OXYCODONE HCL 5 MG/5ML PO SOLN: 5.0000 mg | Freq: Once | ORAL | Status: DC | PRN

## 2023-12-20 MED ORDER — CHLORHEXIDINE GLUCONATE 0.12 % MT SOLN
15.0000 mL | Freq: Once | OROMUCOSAL | Status: AC
  Administered 2023-12-20: 15 mL via OROMUCOSAL

## 2023-12-20 MED ORDER — SODIUM CHLORIDE 0.9 % IV SOLN
INTRAVENOUS | Status: DC | PRN
  Administered 2023-12-20: 80 mL

## 2023-12-20 MED ORDER — BUPIVACAINE LIPOSOME 1.3 % IJ SUSP
INTRAMUSCULAR | Status: AC
  Filled 2023-12-20: qty 20

## 2023-12-20 MED ORDER — PHENYLEPHRINE HCL-NACL 20-0.9 MG/250ML-% IV SOLN
INTRAVENOUS | Status: DC | PRN
  Administered 2023-12-20: 80 ug via INTRAVENOUS
  Administered 2023-12-20: 40 ug/min via INTRAVENOUS

## 2023-12-20 MED ORDER — PANTOPRAZOLE SODIUM 40 MG PO TBEC
40.0000 mg | DELAYED_RELEASE_TABLET | Freq: Every day | ORAL | Status: DC
Start: 2023-12-21 — End: 2023-12-21
  Administered 2023-12-21: 40 mg via ORAL
  Filled 2023-12-20: qty 1

## 2023-12-20 MED ORDER — ONDANSETRON HCL 4 MG/2ML IJ SOLN
INTRAMUSCULAR | Status: DC | PRN
  Administered 2023-12-20: 4 mg via INTRAVENOUS

## 2023-12-20 MED ORDER — SODIUM CHLORIDE (PF) 0.9 % IJ SOLN
INTRAMUSCULAR | Status: AC
  Filled 2023-12-20: qty 10

## 2023-12-20 MED ORDER — DEXAMETHASONE SODIUM PHOSPHATE 10 MG/ML IJ SOLN
10.0000 mg | Freq: Once | INTRAMUSCULAR | Status: AC
  Administered 2023-12-21: 10 mg via INTRAVENOUS
  Filled 2023-12-20: qty 1

## 2023-12-20 MED ORDER — PHENOL 1.4 % MT LIQD: 1.0000 | OROMUCOSAL | Status: DC | PRN

## 2023-12-20 MED ORDER — METHOCARBAMOL 1000 MG/10ML IJ SOLN: 500.0000 mg | Freq: Four times a day (QID) | INTRAMUSCULAR | Status: DC | PRN

## 2023-12-20 MED ORDER — MIDAZOLAM HCL 2 MG/2ML IJ SOLN
1.0000 mg | INTRAMUSCULAR | Status: DC
  Administered 2023-12-20: 1 mg via INTRAVENOUS
  Filled 2023-12-20: qty 2

## 2023-12-20 MED ORDER — ASPIRIN 81 MG PO CHEW
81.0000 mg | CHEWABLE_TABLET | Freq: Two times a day (BID) | ORAL | Status: DC
  Administered 2023-12-21: 81 mg via ORAL
  Filled 2023-12-20: qty 1

## 2023-12-20 MED ORDER — HYDROMORPHONE HCL 2 MG PO TABS
2.0000 mg | ORAL_TABLET | ORAL | Status: DC | PRN
  Administered 2023-12-20: 2 mg via ORAL
  Administered 2023-12-21: 4 mg via ORAL
  Filled 2023-12-20: qty 2
  Filled 2023-12-20: qty 1

## 2023-12-20 MED ORDER — HYOSCYAMINE SULFATE ER 0.375 MG PO TB12: 0.3750 mg | ORAL_TABLET | Freq: Two times a day (BID) | ORAL | Status: DC | PRN

## 2023-12-20 MED ORDER — CLONIDINE HCL (ANALGESIA) 100 MCG/ML EP SOLN
EPIDURAL | Status: DC | PRN
  Administered 2023-12-20: 100 ug

## 2023-12-20 MED ORDER — POVIDONE-IODINE 10 % EX SWAB: 2.0000 | Freq: Once | CUTANEOUS | Status: DC

## 2023-12-20 MED ORDER — ACETAMINOPHEN 10 MG/ML IV SOLN
1000.0000 mg | Freq: Four times a day (QID) | INTRAVENOUS | Status: DC
  Administered 2023-12-20: 1000 mg via INTRAVENOUS
  Filled 2023-12-20: qty 100

## 2023-12-20 MED ORDER — ESCITALOPRAM OXALATE 10 MG PO TABS
10.0000 mg | ORAL_TABLET | Freq: Every day | ORAL | Status: DC
  Administered 2023-12-21: 10 mg via ORAL
  Filled 2023-12-20: qty 1

## 2023-12-20 MED ORDER — SODIUM CHLORIDE 0.9 % IV SOLN: INTRAVENOUS | Status: DC

## 2023-12-20 MED ORDER — CEFAZOLIN SODIUM-DEXTROSE 2-4 GM/100ML-% IV SOLN
2.0000 g | INTRAVENOUS | Status: AC
Start: 2023-12-20 — End: 2023-12-20
  Administered 2023-12-20: 2 g via INTRAVENOUS
  Filled 2023-12-20: qty 100

## 2023-12-20 MED ORDER — METOCLOPRAMIDE HCL 5 MG/ML IJ SOLN: 5.0000 mg | Freq: Three times a day (TID) | INTRAMUSCULAR | Status: DC | PRN

## 2023-12-20 MED ORDER — TRAMADOL HCL 50 MG PO TABS
50.0000 mg | ORAL_TABLET | Freq: Four times a day (QID) | ORAL | Status: DC | PRN
  Administered 2023-12-20: 100 mg via ORAL
  Filled 2023-12-20: qty 2

## 2023-12-20 MED ORDER — METOCLOPRAMIDE HCL 5 MG PO TABS: 5.0000 mg | ORAL_TABLET | Freq: Three times a day (TID) | ORAL | Status: DC | PRN

## 2023-12-20 MED ORDER — CEFAZOLIN SODIUM-DEXTROSE 2-4 GM/100ML-% IV SOLN
2.0000 g | Freq: Four times a day (QID) | INTRAVENOUS | Status: AC
  Administered 2023-12-20 (×2): 2 g via INTRAVENOUS
  Filled 2023-12-20 (×2): qty 100

## 2023-12-20 MED ORDER — ROPIVACAINE HCL 5 MG/ML IJ SOLN
INTRAMUSCULAR | Status: DC | PRN
  Administered 2023-12-20: 20 mL via PERINEURAL

## 2023-12-20 MED ORDER — FENTANYL CITRATE PF 50 MCG/ML IJ SOSY
50.0000 ug | PREFILLED_SYRINGE | INTRAMUSCULAR | Status: DC
  Administered 2023-12-20: 50 ug via INTRAVENOUS
  Filled 2023-12-20: qty 2

## 2023-12-20 MED ORDER — SODIUM CHLORIDE 0.9 % IR SOLN
Status: DC | PRN
  Administered 2023-12-20: 1000 mL

## 2023-12-20 MED ORDER — FENTANYL CITRATE PF 50 MCG/ML IJ SOSY: 25.0000 ug | PREFILLED_SYRINGE | INTRAMUSCULAR | Status: DC | PRN

## 2023-12-20 MED ORDER — ONDANSETRON HCL 4 MG/2ML IJ SOLN
INTRAMUSCULAR | Status: AC
  Filled 2023-12-20: qty 2

## 2023-12-20 MED ORDER — FLEET ENEMA RE ENEM: 1.0000 | ENEMA | Freq: Once | RECTAL | Status: DC | PRN

## 2023-12-20 MED ORDER — BISACODYL 10 MG RE SUPP: 10.0000 mg | Freq: Every day | RECTAL | Status: DC | PRN

## 2023-12-20 MED ORDER — ACETAMINOPHEN 325 MG PO TABS: 325.0000 mg | ORAL_TABLET | Freq: Four times a day (QID) | ORAL | Status: DC | PRN

## 2023-12-20 MED ORDER — ONDANSETRON HCL 4 MG PO TABS: 4.0000 mg | ORAL_TABLET | Freq: Four times a day (QID) | ORAL | Status: DC | PRN

## 2023-12-20 MED ORDER — ACETAMINOPHEN 10 MG/ML IV SOLN: 1000.0000 mg | Freq: Once | INTRAVENOUS | Status: DC | PRN

## 2023-12-20 MED ORDER — 0.9 % SODIUM CHLORIDE (POUR BTL) OPTIME
TOPICAL | Status: DC | PRN
  Administered 2023-12-20: 1000 mL

## 2023-12-20 MED ORDER — TRANEXAMIC ACID-NACL 1000-0.7 MG/100ML-% IV SOLN
1000.0000 mg | INTRAVENOUS | Status: AC
  Administered 2023-12-20: 1000 mg via INTRAVENOUS
  Filled 2023-12-20: qty 100

## 2023-12-20 MED ORDER — OXYCODONE HCL 5 MG PO TABS: 5.0000 mg | ORAL_TABLET | Freq: Once | ORAL | Status: DC | PRN

## 2023-12-20 MED ORDER — SODIUM CHLORIDE (PF) 0.9 % IJ SOLN
INTRAMUSCULAR | Status: AC
  Filled 2023-12-20: qty 50

## 2023-12-20 MED ORDER — METHOCARBAMOL 500 MG PO TABS
500.0000 mg | ORAL_TABLET | Freq: Four times a day (QID) | ORAL | Status: DC | PRN
  Administered 2023-12-20: 500 mg via ORAL
  Filled 2023-12-20: qty 1

## 2023-12-20 MED ORDER — ONDANSETRON HCL 4 MG/2ML IJ SOLN: 4.0000 mg | Freq: Once | INTRAMUSCULAR | Status: DC | PRN

## 2023-12-20 MED ORDER — ACETAMINOPHEN 500 MG PO TABS
1000.0000 mg | ORAL_TABLET | Freq: Four times a day (QID) | ORAL | Status: DC
  Administered 2023-12-20 – 2023-12-21 (×3): 1000 mg via ORAL
  Filled 2023-12-20 (×2): qty 2

## 2023-12-20 MED ORDER — BUPIVACAINE LIPOSOME 1.3 % IJ SUSP: 20.0000 mL | Freq: Once | INTRAMUSCULAR | Status: DC

## 2023-12-20 MED ORDER — POLYETHYLENE GLYCOL 3350 17 G PO PACK: 17.0000 g | PACK | Freq: Every day | ORAL | Status: DC | PRN

## 2023-12-20 MED ORDER — PROPOFOL 1000 MG/100ML IV EMUL
INTRAVENOUS | Status: AC
  Filled 2023-12-20: qty 100

## 2023-12-20 MED ORDER — ROSUVASTATIN CALCIUM 10 MG PO TABS
10.0000 mg | ORAL_TABLET | Freq: Every day | ORAL | Status: DC
Start: 2023-12-21 — End: 2023-12-21

## 2023-12-20 SURGICAL SUPPLY — 35 items
ATTUNE PSRP INSR SZ4 16 KNEE (Insert) IMPLANT
BAG COUNTER SPONGE SURGICOUNT (BAG) IMPLANT
BAG ZIPLOCK 12X15 (MISCELLANEOUS) IMPLANT
BNDG ELASTIC 6INX 5YD STR LF (GAUZE/BANDAGES/DRESSINGS) ×1 IMPLANT
COVER SURGICAL LIGHT HANDLE (MISCELLANEOUS) ×1 IMPLANT
CUFF TRNQT CYL 34X4.125X (TOURNIQUET CUFF) ×1 IMPLANT
DERMABOND ADVANCED .7 DNX12 (GAUZE/BANDAGES/DRESSINGS) IMPLANT
DRAPE U-SHAPE 47X51 STRL (DRAPES) ×1 IMPLANT
DRSG AQUACEL AG ADV 3.5X10 (GAUZE/BANDAGES/DRESSINGS) ×1 IMPLANT
DURAPREP 26ML APPLICATOR (WOUND CARE) ×1 IMPLANT
ELECT REM PT RETURN 15FT ADLT (MISCELLANEOUS) ×1 IMPLANT
GLOVE BIO SURGEON STRL SZ 6.5 (GLOVE) IMPLANT
GLOVE BIO SURGEON STRL SZ7 (GLOVE) IMPLANT
GLOVE BIO SURGEON STRL SZ8 (GLOVE) ×1 IMPLANT
GLOVE BIOGEL PI IND STRL 7.0 (GLOVE) IMPLANT
GLOVE BIOGEL PI IND STRL 8 (GLOVE) ×1 IMPLANT
GOWN STRL REUS W/ TWL LRG LVL3 (GOWN DISPOSABLE) ×2 IMPLANT
HOLDER FOLEY CATH W/STRAP (MISCELLANEOUS) IMPLANT
IMMOBILIZER KNEE 20 (SOFTGOODS) ×1 IMPLANT
IMMOBILIZER KNEE 20 THIGH 36 (SOFTGOODS) ×1 IMPLANT
KIT TURNOVER KIT A (KITS) IMPLANT
MANIFOLD NEPTUNE II (INSTRUMENTS) ×1 IMPLANT
NS IRRIG 1000ML POUR BTL (IV SOLUTION) ×1 IMPLANT
PACK TOTAL KNEE CUSTOM (KITS) ×1 IMPLANT
PADDING CAST COTTON 6X4 STRL (CAST SUPPLIES) ×2 IMPLANT
PROTECTOR NERVE ULNAR (MISCELLANEOUS) ×1 IMPLANT
SET HNDPC FAN SPRY TIP SCT (DISPOSABLE) ×1 IMPLANT
SUT MNCRL AB 4-0 PS2 18 (SUTURE) ×1 IMPLANT
SUT STRATAFIX 0 PDS 27 VIOLET (SUTURE) ×1 IMPLANT
SUT VIC AB 2-0 CT1 TAPERPNT 27 (SUTURE) ×3 IMPLANT
SUTURE STRATFX 0 PDS 27 VIOLET (SUTURE) ×1 IMPLANT
TRAY FOLEY MTR SLVR 14FR STAT (SET/KITS/TRAYS/PACK) IMPLANT
TUBE SUCTION HIGH CAP CLEAR NV (SUCTIONS) ×1 IMPLANT
WATER STERILE IRR 1000ML POUR (IV SOLUTION) ×1 IMPLANT
WRAP KNEE MAXI GEL POST OP (GAUZE/BANDAGES/DRESSINGS) IMPLANT

## 2023-12-20 NOTE — Anesthesia Preprocedure Evaluation (Signed)
 Anesthesia Evaluation  Patient identified by MRN, date of birth, ID band Patient awake    Reviewed: Allergy & Precautions, NPO status , Patient's Chart, lab work & pertinent test results, reviewed documented beta blocker date and time   History of Anesthesia Complications (+) PONV and history of anesthetic complications  Airway Mallampati: II  TM Distance: >3 FB     Dental no notable dental hx.    Pulmonary neg COPD, neg PE   breath sounds clear to auscultation       Cardiovascular (-) angina (-) CAD, (-) Past MI, (-) Cardiac Stents and (-) CABG  Rhythm:Regular Rate:Normal     Neuro/Psych  PSYCHIATRIC DISORDERS Anxiety Depression     Neuromuscular disease    GI/Hepatic ,GERD  Medicated and Controlled,,(+) neg Cirrhosis        Endo/Other    Renal/GU Renal disease     Musculoskeletal  (+) Arthritis , Osteoarthritis,    Abdominal   Peds  Hematology   Anesthesia Other Findings   Reproductive/Obstetrics                              Anesthesia Physical Anesthesia Plan  ASA: 2  Anesthesia Plan: Spinal   Post-op Pain Management: Regional block*   Induction: Intravenous  PONV Risk Score and Plan: 2  Airway Management Planned: Natural Airway and Simple Face Mask  Additional Equipment:   Intra-op Plan:   Post-operative Plan:   Informed Consent: I have reviewed the patients History and Physical, chart, labs and discussed the procedure including the risks, benefits and alternatives for the proposed anesthesia with the patient or authorized representative who has indicated his/her understanding and acceptance.     Dental advisory given  Plan Discussed with: CRNA  Anesthesia Plan Comments:          Anesthesia Quick Evaluation

## 2023-12-20 NOTE — Anesthesia Procedure Notes (Signed)
 Spinal  Patient location during procedure: OR Start time: 12/20/2023 10:58 AM End time: 12/20/2023 11:03 AM Reason for block: surgical anesthesia Staffing Performed: anesthesiologist  Anesthesiologist: Mariann Barter, MD Performed by: Mariann Barter, MD Authorized by: Mariann Barter, MD   Preanesthetic Checklist Completed: patient identified, IV checked, site marked, risks and benefits discussed, surgical consent, monitors and equipment checked, pre-op evaluation and timeout performed Spinal Block Patient position: sitting Prep: DuraPrep Patient monitoring: heart rate, cardiac monitor, continuous pulse ox and blood pressure Approach: midline Location: L3-4 Injection technique: single-shot Needle Needle type: Sprotte  Needle gauge: 24 G Needle length: 9 cm Assessment Sensory level: T4 Events: CSF return

## 2023-12-20 NOTE — Transfer of Care (Signed)
 Immediate Anesthesia Transfer of Care Note  Patient: Wanda Gonzales  Procedure(s) Performed: Left Knee polyethylene revision (Left: Knee)  Patient Location: PACU  Anesthesia Type:MAC, Regional, and Spinal  Level of Consciousness: awake, alert , and oriented  Airway & Oxygen Therapy: Patient Spontanous Breathing  Post-op Assessment: Report given to RN and Post -op Vital signs reviewed and stable  Post vital signs: Reviewed and stable  Last Vitals:  Vitals Value Taken Time  BP    Temp    Pulse 84 12/20/23 1215  Resp 13 12/20/23 1215  SpO2 97 % 12/20/23 1215  Vitals shown include unfiled device data.  Last Pain:  Vitals:   12/20/23 0919  TempSrc:   PainSc: 6          Complications: No notable events documented.

## 2023-12-20 NOTE — Brief Op Note (Signed)
 12/20/2023  11:50 AM  PATIENT:  Wanda Gonzales  68 y.o. female  PRE-OPERATIVE DIAGNOSIS:  Unstable Left total knee arthroplasty  POST-OPERATIVE DIAGNOSIS:  Unstable Left total knee arthroplasty  PROCEDURE:  Procedure(s): Left Knee polyethylene revision (Left)  SURGEON:  Surgeons and Role:    Ollen Gross, MD - Primary  PHYSICIAN ASSISTANT:   ASSISTANTS: Arcola Jansky, PA-C  ANESTHESIA:    Adductor canal block and spinal  EBL:  25 mL   BLOOD ADMINISTERED:none  DRAINS: none   LOCAL MEDICATIONS USED:  OTHER Exparel  COUNTS:  YES  TOURNIQUET:   Total Tourniquet Time Documented: Thigh (Left) - 20 minutes Total: Thigh (Left) - 20 minutes   DICTATION: .Other Dictation: Dictation Number [id number]  PLAN OF CARE: Admit to inpatient   PATIENT DISPOSITION:  PACU - hemodynamically stable.

## 2023-12-20 NOTE — Plan of Care (Signed)

## 2023-12-20 NOTE — Anesthesia Procedure Notes (Signed)
 Date/Time: 12/20/2023 11:19 AM  Performed by: Maurene Capes, CRNAOxygen Delivery Method: Simple face mask Placement Confirmation: positive ETCO2 Dental Injury: Teeth and Oropharynx as per pre-operative assessment

## 2023-12-20 NOTE — Progress Notes (Signed)
 Orthopedic Tech Progress Note Patient Details:  Wanda Gonzales [birth year] [id number]  CPM Left Knee CPM Left Knee: On Left Knee Flexion (Degrees): 40 Left Knee Extension (Degrees): 10  Post Interventions Patient Tolerated: Well Instructions Provided: Adjustment of device, Care of device  Kizzie Fantasia 12/20/2023, 12:23 PM

## 2023-12-20 NOTE — Anesthesia Procedure Notes (Signed)
 Procedure Name: MAC Date/Time: 12/20/2023 10:55 AM  Performed by: Maurene Capes, CRNAPre-anesthesia Checklist: Patient identified, Emergency Drugs available, Suction available, Patient being monitored and Timeout performed Patient Re-evaluated:Patient Re-evaluated prior to induction Oxygen Delivery Method: Nasal cannula Preoxygenation: Pre-oxygenation with 100% oxygen Induction Type: IV induction Placement Confirmation: positive ETCO2 Dental Injury: Teeth and Oropharynx as per pre-operative assessment

## 2023-12-20 NOTE — Anesthesia Procedure Notes (Signed)
 Anesthesia Regional Block: Adductor canal block   Pre-Anesthetic Checklist: , timeout performed,  Correct Patient, Correct Site, Correct Laterality,  Correct Procedure, Correct Position, site marked,  Risks and benefits discussed,  Surgical consent,  Pre-op evaluation,  At surgeon's request and post-op pain management  Laterality: Left  Prep: Maximum Sterile Barrier Precautions used, chloraprep       Needles:  Injection technique: Single-shot  Needle Type: Echogenic Needle      Needle Gauge: 20     Additional Needles:   Procedures:,,,, ultrasound used (permanent image in chart),,    Narrative:  Start time: 12/20/2023 10:37 AM End time: 12/20/2023 10:39 AM Injection made incrementally with aspirations every 5 mL.  Performed by: Personally  Anesthesiologist: Mariann Barter, MD

## 2023-12-20 NOTE — Interval H&P Note (Signed)
 History and Physical Interval Note:  12/20/2023 9:43 AM  Wanda Gonzales  has presented today for surgery, with the diagnosis of Unstable Left total knee arthroplasty.  The various methods of treatment have been discussed with the patient and family. After consideration of risks, benefits and other options for treatment, the patient has consented to  Procedure(s): Left Knee polyethylene revision (Left) as a surgical intervention.  The patient's history has been reviewed, patient examined, no change in status, stable for surgery.  I have reviewed the patient's chart and labs.  Questions were answered to the patient's satisfaction.     Homero Fellers Colbi Staubs

## 2023-12-20 NOTE — Discharge Instructions (Signed)
 Ollen Gross, MD Total Joint Specialist [facility] [place] [address] [place], [place] [address] [phone] POSTOPERATIVE DIRECTIONS  Knee Rehabilitation, Guidelines Following Surgery  Results after knee surgery are often greatly improved when you follow the exercise, range of motion and muscle strengthening exercises prescribed by your doctor. Safety measures are also important to protect the knee from further injury. If any of these exercises cause you to have increased pain or swelling in your knee joint, decrease the amount until you are comfortable again and slowly increase them. If you have problems or questions, call your caregiver or physical therapist for advice.   BLOOD CLOT PREVENTION Take an 81 mg Aspirin two times a day for three weeks following surgery. Then take an 81 mg Aspirin once a day for three weeks. Then discontinue Aspirin. You may resume your vitamins/supplements upon discharge from the hospital. Do not take any NSAIDs (Advil, Aleve, Ibuprofen, Meloxicam, etc.) until you are 3 weeks out from surgery  HOME CARE INSTRUCTIONS  Remove items at home which could result in a fall. This includes throw rugs or furniture in walking pathways.  ICE to the affected knee as much as tolerated. Icing helps control swelling. If the swelling is well controlled you will be more comfortable and rehab easier. Continue to use ice on the knee for pain and swelling from surgery. You may notice swelling that will progress down to the foot and ankle. This is normal after surgery. Elevate the leg when you are not up walking on it.    Continue to use the breathing machine which will help keep your temperature down. It is common for your temperature to cycle up and down following surgery, especially at night when you are not up moving around and exerting yourself. The breathing machine keeps your lungs expanded and your temperature down. Do not place pillow under the  operative knee, focus on keeping the knee straight while resting  DIET You may resume your previous home diet once you are discharged from the hospital.  DRESSING / WOUND CARE / SHOWERING Keep your bulky bandage on for 2 days. On the third post-operative day you may remove the Ace bandage and gauze. There is a waterproof adhesive bandage on your skin which will stay in place until your first follow-up appointment. Once you remove this you will not need to place another bandage You may begin showering 3 days following surgery, but do not submerge the incision under water.  ACTIVITY For the first 5 days, the key is rest and control of pain and swelling Do your home exercises twice a day starting on post-operative day 3. On the days you go to physical therapy, just do the home exercises once that day. You should rest, ice and elevate the leg for 50 minutes out of every hour. Get up and walk/stretch for 10 minutes per hour. After 5 days you can increase your activity slowly as tolerated. Walk with your walker as instructed. Use the walker until you are comfortable transitioning to a cane. Walk with the cane in the opposite hand of the operative leg. You may discontinue the cane once you are comfortable and walking steadily. Avoid periods of inactivity such as sitting longer than an hour when not asleep. This helps prevent blood clots.  You may discontinue the knee immobilizer once you are able to perform a straight leg raise while lying down. You may resume a sexual relationship in one month or when given the OK by  your doctor.  You may return to work once you are cleared by your doctor.  Do not drive a car for 6 weeks or until released by your surgeon.  Do not drive while taking narcotics.  TED HOSE STOCKINGS Wear the elastic stockings on both legs for three weeks following surgery during the day. You may remove them at night for sleeping.  WEIGHT BEARING Weight bearing as tolerated with assist  device (walker, cane, etc) as directed, use it as long as suggested by your surgeon or therapist, typically at least 4-6 weeks.  POSTOPERATIVE CONSTIPATION PROTOCOL Constipation - defined medically as fewer than three stools per week and severe constipation as less than one stool per week.  One of the most common issues patients have following surgery is constipation.  Even if you have a regular bowel pattern at home, your normal regimen is likely to be disrupted due to multiple reasons following surgery.  Combination of anesthesia, postoperative narcotics, change in appetite and fluid intake all can affect your bowels.  In order to avoid complications following surgery, here are some recommendations in order to help you during your recovery period.  Colace (docusate) - Pick up an over-the-counter form of Colace or another stool softener and take twice a day as long as you are requiring postoperative pain medications.  Take with a full glass of water daily.  If you experience loose stools or diarrhea, hold the colace until you stool forms back up. If your symptoms do not get better within 1 week or if they get worse, check with your doctor. Dulcolax (bisacodyl) - Pick up over-the-counter and take as directed by the product packaging as needed to assist with the movement of your bowels.  Take with a full glass of water.  Use this product as needed if not relieved by Colace only.  MiraLax (polyethylene glycol) - Pick up over-the-counter to have on hand. MiraLax is a solution that will increase the amount of water in your bowels to assist with bowel movements.  Take as directed and can mix with a glass of water, juice, soda, coffee, or tea. Take if you go more than two days without a movement. Do not use MiraLax more than once per day. Call your doctor if you are still constipated or irregular after using this medication for 7 days in a row.  If you continue to have problems with postoperative constipation,  please contact the office for further assistance and recommendations.  If you experience "the worst abdominal pain ever" or develop nausea or vomiting, please contact the office immediatly for further recommendations for treatment.  ITCHING If you experience itching with your medications, try taking only a single pain pill, or even half a pain pill at a time.  You can also use Benadryl over the counter for itching or also to help with sleep.   MEDICATIONS See your medication summary on the "After Visit Summary" that the nursing staff will review with you prior to discharge.  You may have some home medications which will be placed on hold until you complete the course of blood thinner medication.  It is important for you to complete the blood thinner medication as prescribed by your surgeon.  Continue your approved medications as instructed at time of discharge.  PRECAUTIONS If you experience chest pain or shortness of breath - call 911 immediately for transfer to the hospital emergency department.  If you develop a fever greater that 101 F, purulent drainage from wound, increased redness  or drainage from wound, foul odor from the wound/dressing, or calf pain - CONTACT YOUR SURGEON.                                                   FOLLOW-UP APPOINTMENTS Make sure you keep all of your appointments after your operation with your surgeon and caregivers. You should call the office at the above phone number and make an appointment for approximately two weeks after the date of your surgery or on the date instructed by your surgeon outlined in the "After Visit Summary".  RANGE OF MOTION AND STRENGTHENING EXERCISES  Rehabilitation of the knee is important following a knee injury or an operation. After just a few days of immobilization, the muscles of the thigh which control the knee become weakened and shrink (atrophy). Knee exercises are designed to build up the tone and strength of the thigh muscles and to  improve knee motion. Often times heat used for twenty to thirty minutes before working out will loosen up your tissues and help with improving the range of motion but do not use heat for the first two weeks following surgery. These exercises can be done on a training (exercise) mat, on the floor, on a table or on a bed. Use what ever works the best and is most comfortable for you Knee exercises include:  Leg Lifts - While your knee is still immobilized in a splint or cast, you can do straight leg raises. Lift the leg to 60 degrees, hold for 3 sec, and slowly lower the leg. Repeat 10-20 times 2-3 times daily. Perform this exercise against resistance later as your knee gets better.  Quad and Hamstring Sets - Tighten up the muscle on the front of the thigh (Quad) and hold for 5-10 sec. Repeat this 10-20 times hourly. Hamstring sets are done by pushing the foot backward against an object and holding for 5-10 sec. Repeat as with quad sets.  Leg Slides: Lying on your back, slowly slide your foot toward your buttocks, bending your knee up off the floor (only go as far as is comfortable). Then slowly slide your foot back down until your leg is flat on the floor again. Angel Wings: Lying on your back spread your legs to the side as far apart as you can without causing discomfort.  A rehabilitation program following serious knee injuries can speed recovery and prevent re-injury in the future due to weakened muscles. Contact your doctor or a physical therapist for more information on knee rehabilitation.   POST-OPERATIVE OPIOID TAPER INSTRUCTIONS: It is important to wean off of your opioid medication as soon as possible. If you do not need pain medication after your surgery it is ok to stop day one. Opioids include: Codeine, Hydrocodone(Norco, Vicodin), Oxycodone(Percocet, oxycontin) and hydromorphone amongst others.  Long term and even short term use of opiods can cause: Increased pain  response Dependence Constipation Depression Respiratory depression And more.  Withdrawal symptoms can include Flu like symptoms Nausea, vomiting And more Techniques to manage these symptoms Hydrate well Eat regular healthy meals Stay active Use relaxation techniques(deep breathing, meditating, yoga) Do Not substitute Alcohol to help with tapering If you have been on opioids for less than two weeks and do not have pain than it is ok to stop all together.  Plan to wean off of opioids This  plan should start within one week post op of your joint replacement. Maintain the same interval or time between taking each dose and first decrease the dose.  Cut the total daily intake of opioids by one tablet each day Next start to increase the time between doses. The last dose that should be eliminated is the evening dose.   IF YOU ARE TRANSFERRED TO A SKILLED REHAB FACILITY If the patient is transferred to a skilled rehab facility following release from the hospital, a list of the current medications will be sent to the facility for the patient to continue.  When discharged from the skilled rehab facility, please have the facility set up the patient's Home Health Physical Therapy prior to being released. Also, the skilled facility will be responsible for providing the patient with their medications at time of release from the facility to include their pain medication, the muscle relaxants, and their blood thinner medication. If the patient is still at the rehab facility at time of the two week follow up appointment, the skilled rehab facility will also need to assist the patient in arranging follow up appointment in our office and any transportation needs.  MAKE SURE YOU:  Understand these instructions.  Get help right away if you are not doing well or get worse.   DENTAL ANTIBIOTICS:  In most cases prophylactic antibiotics for Dental procdeures after total joint surgery are not  necessary.  Exceptions are as follows:  1. History of prior total joint infection  2. Severely immunocompromised (Organ Transplant, cancer chemotherapy, Rheumatoid biologic meds such as Humera)  3. Poorly controlled diabetes (A1C &gt; 8.0, blood glucose over 200)  If you have one of these conditions, contact your surgeon for an antibiotic prescription, prior to your dental procedure.    Pick up stool softner and laxative for home use following surgery while on pain medications. Do not submerge incision under water. Please use good hand washing techniques while changing dressing each day. May shower starting three days after surgery. Please use a clean towel to pat the incision dry following showers. Continue to use ice for pain and swelling after surgery. Do not use any lotions or creams on the incision until instructed by your surgeon.

## 2023-12-20 NOTE — Progress Notes (Signed)
 Physical Therapy Evaluation Patient Details Name: Wanda Gonzales MRN: [id number] DOB: 28-Dec-1955 Today's Date: 12/20/2023  History of Present Illness  Pt is 68 yo female s/p L knee polyethylene revision on 12/20/23.  Pt with hx including but not limited to L hand tremor, IBS, pelvic pain, L hip bursitis, frequent falls, OP, L TKA 2017, L THA  Clinical Impression  Pt is s/p above sx resulting in the deficits listed below (see PT Problem List). At baseline, pt independent and working.  She has DME and home support.  Today, pt able to ambulate 38' with RW and CGA.  Reports pain at 8/10 but states only achy and no signs of severe pain.  Pt expected to progress well.  Pt will benefit from acute skilled PT to increase their independence and safety with mobility to allow discharge.          If plan is discharge home, recommend the following: A little help with walking and/or transfers;A little help with bathing/dressing/bathroom;Assistance with cooking/housework;Help with stairs or ramp for entrance   Can travel by private vehicle        Equipment Recommendations None recommended by PT  Recommendations for Other Services       Functional Status Assessment Patient has had a recent decline in their functional status and demonstrates the ability to make significant improvements in function in a reasonable and predictable amount of time.     Precautions / Restrictions Precautions Precautions: Fall;Knee Restrictions Weight Bearing Restrictions Per Provider Order: Yes LLE Weight Bearing Per Provider Order: Weight bearing as tolerated      Mobility  Bed Mobility Overal bed mobility: Needs Assistance Bed Mobility: Supine to Sit     Supine to sit: Min assist          Transfers Overall transfer level: Needs assistance Equipment used: Rolling walker (2 wheels) Transfers: Sit to/from Stand Sit to Stand: Contact guard assist                Ambulation/Gait Ambulation/Gait assistance:  Contact guard assist Gait Distance (Feet): 60 Feet Assistive device: Rolling walker (2 wheels) Gait Pattern/deviations: Step-to pattern, Decreased stride length, Decreased weight shift to left Gait velocity: decreased     General Gait Details: cues for RW proximity; tolerated well  Stairs            Wheelchair Mobility     Tilt Bed    Modified Rankin (Stroke Patients Only)       Balance Overall balance assessment: Needs assistance Sitting-balance support: No upper extremity supported Sitting balance-Leahy Scale: Good     Standing balance support: Bilateral upper extremity supported, Reliant on assistive device for balance Standing balance-Leahy Scale: Poor Standing balance comment: Steady wtih RW                             Pertinent Vitals/Pain Pain Assessment Pain Assessment: 0-10 Pain Score: 8  Pain Location: L knee Pain Descriptors / Indicators: Aching (Reports not severe, just achy) Pain Intervention(s): Limited activity within patient's tolerance, Monitored during session, Repositioned, Patient requesting pain meds-RN notified, Ice applied    Home Living Family/patient expects to be discharged to:: Private residence Living Arrangements: Spouse/significant other Available Help at Discharge: Family;Available 24 hours/day Type of Home: House Home Access: Stairs to enter Entrance Stairs-Rails: None Entrance Stairs-Number of Steps: 1   Home Layout: One level Home Equipment: Pharmacist, hospital (2 wheels);Cane - single point  Prior Function Prior Level of Function : Independent/Modified Independent;Working/employed;Driving             Mobility Comments: Could ambulate in community without AD       Extremity/Trunk Assessment   Upper Extremity Assessment Upper Extremity Assessment: Overall WFL for tasks assessed    Lower Extremity Assessment Lower Extremity Assessment: LLE deficits/detail;RLE deficits/detail RLE  Deficits / Details: ROM WFL; MMT 5/5 RLE Sensation: WNL LLE Deficits / Details: Expected post op changes; L knee ROM 5 to 80 degrees; MMT ankle 5/5, knee 3/5, hip 3/5; able to SLR without LAG LLE Sensation: WNL    Cervical / Trunk Assessment Cervical / Trunk Assessment: Normal  Communication        Cognition Arousal: Alert Behavior During Therapy: WFL for tasks assessed/performed   PT - Cognitive impairments: No apparent impairments                                 Cueing       General Comments General comments (skin integrity, edema, etc.): VSS ; educated to rest wtih leg straight; encouraged ankle pumps    Exercises     Assessment/Plan    PT Assessment Patient needs continued PT services  PT Problem List Decreased strength;Pain;Decreased range of motion;Impaired sensation;Decreased activity tolerance;Decreased knowledge of use of DME;Decreased balance;Decreased mobility       PT Treatment Interventions DME instruction;Therapeutic exercise;Gait training;Balance training;Stair training;Modalities;Functional mobility training;Therapeutic activities;Patient/family education    PT Goals (Current goals can be found in the Care Plan section)  Acute Rehab PT Goals Patient Stated Goal: return home PT Goal Formulation: With patient/family Time For Goal Achievement: 01/03/24 Potential to Achieve Goals: Good    Frequency 7X/week     Co-evaluation               AM-PAC PT "6 Clicks" Mobility  Outcome Measure Help needed turning from your back to your side while in a flat bed without using bedrails?: A Little Help needed moving from lying on your back to sitting on the side of a flat bed without using bedrails?: A Little Help needed moving to and from a bed to a chair (including a wheelchair)?: A Little Help needed standing up from a chair using your arms (e.g., wheelchair or bedside chair)?: A Little Help needed to walk in hospital room?: A Little Help  needed climbing 3-5 steps with a railing? : A Little 6 Click Score: 18    End of Session Equipment Utilized During Treatment: Gait belt Activity Tolerance: Patient tolerated treatment well Patient left: with chair alarm set;in chair;with call bell/phone within reach;with SCD's reapplied;with family/visitor present Nurse Communication: Mobility status PT Visit Diagnosis: Other abnormalities of gait and mobility (R26.89);Muscle weakness (generalized) (M62.81)    Time: 5284-1324 PT Time Calculation (min) (ACUTE ONLY): 12 min   Charges:   PT Evaluation $PT Eval Low Complexity: 1 Low   PT General Charges $$ ACUTE PT VISIT: 1 Visit         Anise Salvo, PT Acute Rehab [facility] [phone]   Rayetta Humphrey 12/20/2023, 4:46 PM

## 2023-12-20 NOTE — Anesthesia Postprocedure Evaluation (Signed)
 Anesthesia Post Note  Patient: Wanda Gonzales  Procedure(s) Performed: Left Knee polyethylene revision (Left: Knee)     Patient location during evaluation: PACU Anesthesia Type: Spinal Level of consciousness: awake and alert Pain management: pain level controlled Vital Signs Assessment: post-procedure vital signs reviewed and stable Respiratory status: spontaneous breathing, nonlabored ventilation, respiratory function stable and patient connected to nasal cannula oxygen Cardiovascular status: blood pressure returned to baseline and stable Postop Assessment: no apparent nausea or vomiting Anesthetic complications: no   No notable events documented.  Last Vitals:  Vitals:   12/20/23 1330 12/20/23 1528  BP: (!) 94/48 95/64  Pulse: 71 79  Resp: 18 16  Temp: (!) 36.4 C 36.6 C  SpO2: 100% 94%    Last Pain:  Vitals:   12/20/23 1612  TempSrc:   PainSc: 4                  Mariann Barter

## 2023-12-21 ENCOUNTER — Encounter (HOSPITAL_COMMUNITY): Payer: Self-pay | Admitting: Orthopedic Surgery

## 2023-12-21 LAB — BASIC METABOLIC PANEL WITH GFR
Anion gap: 8 (ref 5–15)
BUN: 17 mg/dL (ref 8–23)
CO2: 19 mmol/L — ABNORMAL LOW (ref 22–32)
Calcium: 8.4 mg/dL — ABNORMAL LOW (ref 8.9–10.3)
Chloride: 107 mmol/L (ref 98–111)
Creatinine, Ser: 0.76 mg/dL (ref 0.44–1.00)
GFR, Estimated: >60 mL/min (ref >60)
Glucose, Bld: 163 mg/dL — ABNORMAL HIGH (ref 70–99)
Potassium: 3.9 mmol/L (ref 3.5–5.1)
Sodium: 134 mmol/L — ABNORMAL LOW (ref 135–145)

## 2023-12-21 LAB — CBC
HCT: 35.5 % — ABNORMAL LOW (ref 36.0–46.0)
Hemoglobin: 11.1 g/dL — ABNORMAL LOW (ref 12.0–15.0)
MCH: 29.4 pg (ref 26.0–34.0)
MCHC: 31.3 g/dL (ref 30.0–36.0)
MCV: 93.9 fL (ref 80.0–100.0)
Platelets: 213 10*3/uL (ref 150–400)
RBC: 3.78 MIL/uL — ABNORMAL LOW (ref 3.87–5.11)
RDW: 12.8 % (ref 11.5–15.5)
WBC: 11.3 10*3/uL — ABNORMAL HIGH (ref 4.0–10.5)
nRBC: 0 % (ref 0.0–0.2)

## 2023-12-21 MED ORDER — METHOCARBAMOL 500 MG PO TABS: 500.0000 mg | ORAL_TABLET | Freq: Four times a day (QID) | ORAL | 0 refills | Status: DC | PRN

## 2023-12-21 MED ORDER — TRAMADOL HCL 50 MG PO TABS: 50.0000 mg | ORAL_TABLET | Freq: Four times a day (QID) | ORAL | 0 refills | Status: DC | PRN

## 2023-12-21 MED ORDER — ONDANSETRON HCL 4 MG PO TABS: 4.0000 mg | ORAL_TABLET | Freq: Four times a day (QID) | ORAL | 0 refills | Status: DC | PRN

## 2023-12-21 MED ORDER — HYDROMORPHONE HCL 2 MG PO TABS: 2.0000 mg | ORAL_TABLET | Freq: Four times a day (QID) | ORAL | 0 refills | Status: DC | PRN

## 2023-12-21 MED ORDER — ASPIRIN 81 MG PO CHEW: 81.0000 mg | CHEWABLE_TABLET | Freq: Two times a day (BID) | ORAL | 0 refills | Status: AC

## 2023-12-21 NOTE — Care Management Important Message (Signed)
 Important Message  Patient Details IM Letter given. Name: Wanda Gonzales MRN: [id number] Date of Birth: 08-04-1956   Important Message Given:  Yes - Medicare IM     Caren Macadam 12/21/2023, 10:13 AM

## 2023-12-21 NOTE — Progress Notes (Signed)
 Subjective: 1 Day Post-Op Procedure(s) (LRB): Left Knee polyethylene revision (Left) Patient seen in rounds by Dr. Lequita Halt. Patient is well, and has had no acute complaints or problems. Denies SOB or chest pain. Denies calf pain. Patient reports pain as moderate. Worked with physical therapy yesterday and ambulated 60'. We will continue physical therapy today.  Objective: Vital signs in last 24 hours: Temp:  [97.4 F (36.3 C)-98.7 F (37.1 C)] 98 F (36.7 C) (04/03 0541) Pulse Rate:  [67-90] 71 (04/03 0541) Resp:  [13-23] 18 (04/03 0541) BP: (93-115)/(47-86) 115/86 (04/03 0541) SpO2:  [90 %-100 %] 95 % (04/03 0541) Weight:  [73 kg] 73 kg (04/02 0919)  Intake/Output from previous day:  Intake/Output Summary (Last 24 hours) at 12/21/2023 0803 Last data filed at 12/21/2023 0258 Gross per 24 hour  Intake 1540 ml  Output 1675 ml  Net -135 ml     Intake/Output this shift: No intake/output data recorded.  Labs: Recent Labs    12/21/23 0330  HGB 11.1*   Recent Labs    12/21/23 0330  WBC 11.3*  RBC 3.78*  HCT 35.5*  PLT 213   Recent Labs    12/21/23 0330  NA 134*  K 3.9  CL 107  CO2 19*  BUN 17  CREATININE 0.76  GLUCOSE 163*  CALCIUM 8.4*   No results for input(s): "LABPT", "INR" in the last 72 hours.  Exam: General - Patient is Alert and Oriented Extremity - Neurologically intact Neurovascular intact Sensation intact distally Dorsiflexion/Plantar flexion intact Dressing - dressing C/D/I Motor Function - intact, moving foot and toes well on exam.  Past Medical History:  Diagnosis Date   Abnormal Pap smear, atypical squamous cells of undetermined sign (ASC-US) 1998   treated with cryo, CIN I   ADD (attention deficit disorder) 02/23/2012   easily distracted.   Arthritis 02/23/2012   Rt. hip and lt. arm   Closed displaced fracture of fifth metatarsal bone of left foot 03/31/2017   Depression with anxiety    GERD (gastroesophageal reflux disease)     History of kidney stones    History of osteoporosis    PONV (postoperative nausea and vomiting) 02/23/2012   always has PONV   Post-nasal drainage 02/23/2012   frequent a problem,causes a cough and mucus buildup in throat   Urethral polyp 1998   Dr. Marcello Fennel   Varicose veins    Venous insufficiency    chronic-denies any problems    Assessment/Plan: 1 Day Post-Op Procedure(s) (LRB): Left Knee polyethylene revision (Left) Principal Problem:   Failed total knee arthroplasty (HCC) Active Problems:   History of total knee arthroplasty, left  Estimated body mass index is 31.43 kg/m as calculated from the following:   Height as of this encounter: 5' (1.524 m).   Weight as of this encounter: 73 kg. Advance diet Up with therapy D/C IV fluids  Anticipated LOS equal to or greater than 2 midnights due to - Age 70 and older with one or more of the following:  - Obesity  - Expected need for hospital services (PT, OT, Nursing) required for safe  discharge  - Anticipated need for postoperative skilled nursing care or inpatient rehab  - Active co-morbidities: None OR   - Unanticipated findings during/Post Surgery: Slow post-op progression: GI, pain control, mobility  - Patient is a high risk of re-admission due to: None   DVT Prophylaxis - Aspirin Weight bearing as tolerated.  Continue physical therapy. Expected discharge home today pending progress and  if meeting patient goals. Scheduled for OPPT at [facility] in [place]. Follow-up in clinic in 2 weeks.  The PDMP database was reviewed today prior to any opioid medications being prescribed to this patient.  R. Arcola Jansky, PA-C Orthopedic Surgery 12/21/2023, 8:03 AM

## 2023-12-21 NOTE — Op Note (Signed)
 Wanda, Gonzales MEDICAL RECORD NO: [id number] ACCOUNT NO: [id number] DATE OF BIRTH: 12/14/1955 FACILITY: [facility]ATION: [id number] PHYSICIAN: Gus Rankin. Elainah Rhyne, MD  Operative Report   DATE OF PROCEDURE: 12/20/2023  PREOPERATIVE DIAGNOSIS: Unstable left total knee arthroplasty.  POSTOPERATIVE DIAGNOSIS: Unstable left total knee arthroplasty.  PROCEDURE PERFORMED: Left knee polyethylene revision.  SURGEON: Gus Rankin. Darriana Deboy, MD  ASSISTANT: Arcola Jansky, PAC   ANESTHESIA: Adductor canal block and spinal.  ESTIMATED BLOOD LOSS: 25 mL.  DRAINS: None.  COMPLICATIONS: None.  TOURNIQUET TIME: 20 minutes at 300 mmHg.  CONDITION: Stable to recovery.  BRIEF CLINICAL NOTE: Wanda Gonzales is a 68 year old female who had a left total knee arthroplasty done several years ago. She has had worsening discomfort and instability. The patient has had an infection workup, which was negative, and a bone scan, which did  not show any loosening. The knee has become unstable and is causing associated pain. She presents now for polyethylene versus total knee arthroplasty revision.  DESCRIPTION OF PROCEDURE: After successful administration of adductor canal block and spinal anesthetic, a tourniquet was placed on the left thigh and left lower extremity prepped and draped in the usual sterile fashion. I did an exam under anesthesia  showing that the range of motion was hyperextension of about 10 degrees to flexion of 130. She had varus and valgus laxity in extension and AP laxity in flexion. The left lower extremity was then prepped and draped in the usual sterile fashion. Extremity  was wrapped in an Esmarch. The tourniquet was inflated to 300 mmHg. Midline incision was made with a #10 blade through the subcutaneous tissue to the level of the extensor mechanism. A fresh blade was used to make a medial parapatellar arthrotomy. We  did not encounter any fluid in the joint. Soft tissue over the proximal medial tibia  subperiosteally elevated to the joint line with a knife and into the semimembranosus bursa with a Cobb elevator. Soft tissue laterally was elevated with attention being  paid to avoiding the patellar tendon on the tibial tubercle. Patella was everted and then the knee flexed 90 degrees. I was able to sublux the tibia forward and remove the tibial polyethylene from a tray. It was a size 4 rotating platform posterior  stabilized insert, which was 10 mm thick. We then trialed at 14 mm thick and with that trial, there was still a small amount of hyperextension. It went up to 16 mm, which allowed for full extension with excellent varus, valgus, and anterior/posterior  balance throughout full range of motion. I then removed the trial and tested the interface between the metal components and bone and there was no evidence of any loosening of the metal components. We then placed the permanent size 4, 14 mm thick  posterior stabilized rotating platform insert into the tibial tray. The knee was reduced with outstanding stability throughout full range of motion. The wound was then copiously irrigated with 1 liter of saline using pulsatile lavage. The Exparel 20 mL  mixed with 60 mL of saline was injected into the extensor mechanism periosteum to the femur. The arthrotomy was then closed with a running 0 Stratafix suture. Flexion against gravity was 130 degrees. Tourniquet was then released for a total time of 20  minutes. Subcutaneous closed with interrupted 2-0 Vicryl and subcuticular running 4-0 Monocryl. The incision was cleaned and dried and a sterile dressing applied. The patient was awakened and transported to recovery in stable condition.   Please note, a surgical assistant was  a medical necessity for this procedure to perform it in a safe and expeditious manner. Assistant was necessary for retraction of vital ligaments and neurovascular structures and for proper positioning of the limb to  help remove the old  implant and to safely and accurately place the new implant.     [email or web address] D: 12/20/2023 11:55:50 am T: 12/21/2023 1:06:00 am  JOB: [id number]/ [id number]

## 2023-12-21 NOTE — TOC Transition Note (Signed)
 Transition of Care [facility]) - Discharge Note   Patient Details  Name: Wanda Gonzales MRN: [id number] Date of Birth: 21-Aug-1956  Transition of [facility]) CM/SW Contact:  Howell Rucks, RN Phone Number: 12/21/2023, 9:56 AM   Clinical Narrative:  Met with pt at bedside to review dc therapy and home equipment needs, pt confirmed OPPT at [facility], has RW, no home DME needs. No  TOC needs.      Final next level of care: OP Rehab Barriers to Discharge: No Barriers Identified   Patient Goals and CMS Choice Patient states their goals for this hospitalization and ongoing recovery are:: return home          Discharge Placement                       Discharge Plan and Services Additional resources added to the After Visit Summary for                                       Social Drivers of Health (SDOH) Interventions SDOH Screenings   Food Insecurity: No Food Insecurity (12/20/2023)  Housing: Low Risk  (12/20/2023)  Transportation Needs: No Transportation Needs (12/20/2023)  Utilities: Not At Risk (12/20/2023)  Alcohol Screen: Low Risk  (07/13/2023)  Depression (PHQ2-9): Low Risk  (08/23/2023)  Financial Resource Strain: Low Risk  (07/13/2023)  Physical Activity: Insufficiently Active (07/13/2023)  Social Connections: Moderately Integrated (12/20/2023)  Stress: No Stress Concern Present (07/13/2023)  Tobacco Use: Low Risk  (12/20/2023)  Health Literacy: Adequate Health Literacy (07/12/2023)     Readmission Risk Interventions    12/21/2023    9:55 AM  Readmission Risk Prevention Plan  Post Dischage Appt Complete  Medication Screening Complete  Transportation Screening Complete

## 2023-12-21 NOTE — Discharge Summary (Signed)
 Physician Discharge Summary   Patient ID: Wanda Gonzales MRN: [id number] DOB/AGE: 67-25-1957 67 y.o.  Admit date: 12/20/2023 Discharge date: 12/21/2023  Primary Diagnosis: Unstable left total knee arthroplasty   Admission Diagnoses:  Past Medical History:  Diagnosis Date   Abnormal Pap smear, atypical squamous cells of undetermined sign (ASC-US) 1998   treated with cryo, CIN I   ADD (attention deficit disorder) 02/23/2012   easily distracted.   Arthritis 02/23/2012   Rt. hip and lt. arm   Closed displaced fracture of fifth metatarsal bone of left foot 03/31/2017   Depression with anxiety    GERD (gastroesophageal reflux disease)    History of kidney stones    History of osteoporosis    PONV (postoperative nausea and vomiting) 02/23/2012   always has PONV   Post-nasal drainage 02/23/2012   frequent a problem,causes a cough and mucus buildup in throat   Urethral polyp 1998   Dr. Marcello Fennel   Varicose veins    Venous insufficiency    chronic-denies any problems   Discharge Diagnoses:   Principal Problem:   Failed total knee arthroplasty [facility]) Active Problems:   History of total knee arthroplasty, left  Estimated body mass index is 31.43 kg/m as calculated from the following:   Height as of this encounter: 5' (1.524 m).   Weight as of this encounter: 73 kg.  Procedure:  Procedure(s) (LRB): Left Knee polyethylene revision (Left)   Consults: None  HPI: Wanda Gonzales is a 68 year old female who had a left total knee arthroplasty done several years ago. She has had worsening discomfort and instability. The patient has had an infection workup, which was negative, and a bone scan, which did not show any loosening. The knee has become unstable and is causing associated pain. She presents now for polyethylene versus total knee arthroplasty revision  Laboratory Data: Admission on 12/20/2023, Discharged on 12/21/2023  Component Date Value Ref Range Status   ABO/RH(D) 12/20/2023    Final                    Value:A POS Performed at [facility], [address] [place], [place] [address]    WBC 12/21/2023 11.3 (H)  4.0 - 10.5 K/uL Final   RBC 12/21/2023 3.78 (L)  3.87 - 5.11 MIL/uL Final   Hemoglobin 12/21/2023 11.1 (L)  12.0 - 15.0 g/dL Final   HCT 19/14/7829 35.5 (L)  36.0 - 46.0 % Final   MCV 12/21/2023 93.9  80.0 - 100.0 fL Final   MCH 12/21/2023 29.4  26.0 - 34.0 pg Final   MCHC 12/21/2023 31.3  30.0 - 36.0 g/dL Final   RDW 56/21/3086 12.8  11.5 - 15.5 % Final   Platelets 12/21/2023 213  150 - 400 K/uL Final   nRBC 12/21/2023 0.0  0.0 - 0.2 % Final   Performed at [facility], [address], [place], [place] [address]   Sodium 12/21/2023 134 (L)  135 - 145 mmol/L Final   Potassium 12/21/2023 3.9  3.5 - 5.1 mmol/L Final   Chloride 12/21/2023 107  98 - 111 mmol/L Final   CO2 12/21/2023 19 (L)  22 - 32 mmol/L Final   Glucose, Bld 12/21/2023 163 (H)  70 - 99 mg/dL Final   Glucose reference range applies only to samples taken after fasting for at least 8 hours.   BUN 12/21/2023 17  8 - 23 mg/dL Final   Creatinine, Ser 12/21/2023 0.76  0.44 - 1.00 mg/dL Final  Calcium 12/21/2023 8.4 (L)  8.9 - 10.3 mg/dL Final   GFR, Estimated 12/21/2023 >60  >60 mL/min Final   Comment: (NOTE) Calculated using the CKD-EPI Creatinine Equation (2021)    Anion gap 12/21/2023 8  5 - 15 Final   Performed at [facility], [address] [place], [place] [address]  Hospital Outpatient Visit on 12/06/2023  Component Date Value Ref Range Status   MRSA, PCR 12/06/2023 NEGATIVE  NEGATIVE Final   Staphylococcus aureus 12/06/2023 NEGATIVE  NEGATIVE Final   Comment: (NOTE) The Xpert SA Assay (FDA approved for NASAL specimens in patients 42 years of age and older), is one component of a comprehensive surveillance program. It is not intended to diagnose infection nor to guide or monitor treatment. Performed at [facility],  [address], [place], [place] [address]    WBC 12/06/2023 8.3  4.0 - 10.5 K/uL Final   RBC 12/06/2023 4.59  3.87 - 5.11 MIL/uL Final   Hemoglobin 12/06/2023 13.5  12.0 - 15.0 g/dL Final   HCT 66/44/0347 43.5  36.0 - 46.0 % Final   MCV 12/06/2023 94.8  80.0 - 100.0 fL Final   MCH 12/06/2023 29.4  26.0 - 34.0 pg Final   MCHC 12/06/2023 31.0  30.0 - 36.0 g/dL Final   RDW 42/59/5638 12.9  11.5 - 15.5 % Final   Platelets 12/06/2023 298  150 - 400 K/uL Final   nRBC 12/06/2023 0.0  0.0 - 0.2 % Final   Performed at [facility], [address] [place], [place] [address]   Sodium 12/06/2023 137  135 - 145 mmol/L Final   Potassium 12/06/2023 4.1  3.5 - 5.1 mmol/L Final   Chloride 12/06/2023 105  98 - 111 mmol/L Final   CO2 12/06/2023 23  22 - 32 mmol/L Final   Glucose, Bld 12/06/2023 84  70 - 99 mg/dL Final   Glucose reference range applies only to samples taken after fasting for at least 8 hours.   BUN 12/06/2023 25 (H)  8 - 23 mg/dL Final   Creatinine, Ser 12/06/2023 0.86  0.44 - 1.00 mg/dL Final   Calcium 32/95/1884 9.2  8.9 - 10.3 mg/dL Final   GFR, Estimated 12/06/2023 >60  >60 mL/min Final   Comment: (NOTE) Calculated using the CKD-EPI Creatinine Equation (2021)    Anion gap 12/06/2023 9  5 - 15 Final   Performed at [facility], [address], [place], [place] [address]   ABO/RH(D) 12/06/2023 A POS   Final   Antibody Screen 12/06/2023 NEG   Final   Sample Expiration 12/06/2023 12/20/2023,2359   Final   Extend sample reason 12/06/2023    Final                   Value:NO TRANSFUSIONS OR PREGNANCY IN THE PAST 3 MONTHS Performed at [facility], [address] [place], [place] [address]      X-Rays:No results found.  EKG: Orders placed or performed in visit on 07/05/22   EKG 12-Lead     Hospital Course: Wanda Gonzales is a 68 y.o. who was admitted to [facility]. They were brought to the operating room on  12/20/2023 and underwent Procedure(s): Left Knee polyethylene revision.  Patient tolerated the procedure well and was later transferred to the recovery room and then to the orthopaedic floor for postoperative care. They were given PO and IV analgesics for pain control following their surgery. They  were given 24 hours of postoperative antibiotics of  Anti-infectives (From admission, onward)    Start     Dose/Rate Route Frequency Ordered Stop   12/20/23 1700  ceFAZolin (ANCEF) IVPB 2g/100 mL premix        2 g 200 mL/hr over 30 Minutes Intravenous Every 6 hours 12/20/23 1319 12/20/23 2230   12/20/23 0900  ceFAZolin (ANCEF) IVPB 2g/100 mL premix        2 g 200 mL/hr over 30 Minutes Intravenous On call to O.R. 12/20/23 8416 12/20/23 1133     and started on DVT prophylaxis in the form of Aspirin.   PT and OT were ordered for total joint protocol. Discharge planning consulted to help with postop disposition and equipment needs.  Patient had a fair night on the evening of surgery. They started to get up OOB with therapy on POD #0. Pt was seen during rounds and was ready to go home pending progress with therapy. She worked with therapy on POD #1 and was meeting her goals. Pt was discharged to home later that day in stable condition.  Diet: Regular diet Activity: WBAT Follow-up: in 2 weeks Disposition: Home Discharged Condition: stable   Discharge Instructions     Call MD / Call 911   Complete by: As directed    If you experience chest pain or shortness of breath, CALL 911 and be transported to the hospital emergency room.  If you develope a fever above 101 F, pus (white drainage) or increased drainage or redness at the wound, or calf pain, call your surgeon's office.   Change dressing   Complete by: As directed    You may remove the bulky bandage (ACE wrap and gauze) two days after surgery. You will have an adhesive waterproof bandage underneath. Leave this in place until your first follow-up  appointment.   Constipation Prevention   Complete by: As directed    Drink plenty of fluids.  Prune juice may be helpful.  You may use a stool softener, such as Colace (over the counter) 100 mg twice a day.  Use MiraLax (over the counter) for constipation as needed.   Diet - low sodium heart healthy   Complete by: As directed    Do not put a pillow under the knee. Place it under the heel.   Complete by: As directed    Driving restrictions   Complete by: As directed    No driving for two weeks   Post-operative opioid taper instructions:   Complete by: As directed    POST-OPERATIVE OPIOID TAPER INSTRUCTIONS: It is important to wean off of your opioid medication as soon as possible. If you do not need pain medication after your surgery it is ok to stop day one. Opioids include: Codeine, Hydrocodone(Norco, Vicodin), Oxycodone(Percocet, oxycontin) and hydromorphone amongst others.  Long term and even short term use of opiods can cause: Increased pain response Dependence Constipation Depression Respiratory depression And more.  Withdrawal symptoms can include Flu like symptoms Nausea, vomiting And more Techniques to manage these symptoms Hydrate well Eat regular healthy meals Stay active Use relaxation techniques(deep breathing, meditating, yoga) Do Not substitute Alcohol to help with tapering If you have been on opioids for less than two weeks and do not have pain than it is ok to stop all together.  Plan to wean off of opioids This plan should start within one week post op of your joint replacement. Maintain the same interval or time between taking each dose and  first decrease the dose.  Cut the total daily intake of opioids by one tablet each day Next start to increase the time between doses. The last dose that should be eliminated is the evening dose.      TED hose   Complete by: As directed    Use stockings (TED hose) for three weeks on both leg(s).  You may remove them at  night for sleeping.   Weight bearing as tolerated   Complete by: As directed       Allergies as of 12/21/2023       Reactions   Codeine Other (See Comments)   REACTION: nausea and dizzy   Doxycycline Nausea Only   Concerta [methylphenidate] Hives, Rash   Sulfa Antibiotics Nausea Only        Medication List     TAKE these medications    alendronate 70 MG tablet Commonly known as: FOSAMAX Take 70 mg by mouth once a week.   aspirin 81 MG chewable tablet Chew 1 tablet (81 mg total) by mouth 2 (two) times daily for 20 days. Then take one 81 mg aspirin once a day for three weeks. Then discontinue aspirin. Start taking on: December 22, 2023   escitalopram 10 MG tablet Commonly known as: LEXAPRO Take 1 tablet (10 mg total) by mouth daily.   HYDROmorphone 2 MG tablet Commonly known as: DILAUDID Take 1-2 tablets (2-4 mg total) by mouth every 6 (six) hours as needed for severe pain (pain score 7-10).   hyoscyamine 0.375 MG 12 hr tablet Commonly known as: LEVBID Take 0.375 mg by mouth every 12 (twelve) hours as needed for cramping.   Krill Oil 500 MG Caps Take 500 mg by mouth daily.   methocarbamol 500 MG tablet Commonly known as: ROBAXIN Take 1 tablet (500 mg total) by mouth every 6 (six) hours as needed for muscle spasms.   multivitamin tablet Take 1 tablet by mouth daily.   omeprazole 40 MG capsule Commonly known as: PRILOSEC TAKE 1 CAPSULE(40 MG) BY MOUTH DAILY BEFORE BREAKFAST   ondansetron 4 MG tablet Commonly known as: ZOFRAN Take 1 tablet (4 mg total) by mouth every 6 (six) hours as needed for nausea.   rosuvastatin 10 MG tablet Commonly known as: Crestor Take 1 tablet (10 mg total) by mouth at bedtime.   traMADol 50 MG tablet Commonly known as: ULTRAM Take 1-2 tablets (50-100 mg total) by mouth every 6 (six) hours as needed for moderate pain (pain score 4-6).   VITAMIN D PO Take 1 each by mouth daily.               Discharge Care Instructions   (From admission, onward)           Start     Ordered   12/21/23 0000  Weight bearing as tolerated        12/21/23 0806   12/21/23 0000  Change dressing       Comments: You may remove the bulky bandage (ACE wrap and gauze) two days after surgery. You will have an adhesive waterproof bandage underneath. Leave this in place until your first follow-up appointment.   12/21/23 8657            Follow-up Information     Ollen Gross, MD. Schedule an appointment as soon as possible for a visit in 2 week(s).   Specialty: Orthopedic Surgery Contact information: [address] [place] [address] [phone]  Signed: R. Arcola Jansky, PA-C Orthopedic Surgery 12/21/2023, 4:53 PM

## 2023-12-21 NOTE — Progress Notes (Signed)
 Physical Therapy Treatment Patient Details Name: Wanda Gonzales MRN: [id number] DOB: June 16, 1956 Today's Date: 12/21/2023   History of Present Illness Pt is 68 yo female s/p L knee polyethylene revision on 12/20/23.  Pt with hx including but not limited to L hand tremor, IBS, pelvic pain, L hip bursitis, frequent falls, OP, L TKA 2017, L THA    PT Comments  The patient  is progressing well. Patient has met PT goals for  DC today.    If plan is discharge home, recommend the following: A little help with walking and/or transfers;A little help with bathing/dressing/bathroom;Assistance with cooking/housework;Help with stairs or ramp for entrance   Can travel by private vehicle        Equipment Recommendations    none   Recommendations for Other Services       Precautions / Restrictions Precautions Precautions: Fall;Knee Restrictions LLE Weight Bearing Per Provider Order: Weight bearing as tolerated     Mobility  Bed Mobility Overal bed mobility: Independent                  Transfers Overall transfer level: Needs assistance Equipment used: Rolling walker (2 wheels) Transfers: Sit to/from Stand Sit to Stand: Contact guard assist                Ambulation/Gait Ambulation/Gait assistance: Contact guard assist Gait Distance (Feet): 90 Feet Assistive device: Rolling walker (2 wheels) Gait Pattern/deviations: Step-to pattern, Decreased stride length, Decreased weight shift to left       General Gait Details: cues for RW proximity; tolerated well   Stairs Stairs: Yes Stairs assistance: Contact guard assist Stair Management: No rails, Forwards Number of Stairs: 1     Wheelchair Mobility     Tilt Bed    Modified Rankin (Stroke Patients Only)       Balance Overall balance assessment: Mild deficits observed, not formally tested                                          Communication Communication Communication: No apparent  difficulties  Cognition Arousal: Alert Behavior During Therapy: WFL for tasks assessed/performed   PT - Cognitive impairments: No apparent impairments                                Cueing    Exercises Total Joint Exercises Ankle Circles/Pumps: AROM Quad Sets: AROM, 10 reps, Left Hip ABduction/ADduction: AROM, Left, 10 reps Straight Leg Raises: AROM, Left, 10 reps Long Arc Quad: AROM, Left, 10 reps    General Comments        Pertinent Vitals/Pain Pain Assessment Pain Assessment: 0-10 Pain Score: 2  Pain Location: L knee Pain Descriptors / Indicators: Discomfort Pain Intervention(s): Monitored during session, Premedicated before session, Ice applied    Home Living                          Prior Function            PT Goals (current goals can now be found in the care plan section) Progress towards PT goals: Progressing toward goals    Frequency    7X/week      PT Plan      Co-evaluation  AM-PAC PT "6 Clicks" Mobility   Outcome Measure  Help needed turning from your back to your side while in a flat bed without using bedrails?: A Little Help needed moving from lying on your back to sitting on the side of a flat bed without using bedrails?: A Little Help needed moving to and from a bed to a chair (including a wheelchair)?: A Little Help needed standing up from a chair using your arms (e.g., wheelchair or bedside chair)?: A Little Help needed to walk in hospital room?: A Little Help needed climbing 3-5 steps with a railing? : A Little 6 Click Score: 18    End of Session Equipment Utilized During Treatment: Gait belt Activity Tolerance: Patient tolerated treatment well Patient left: with chair alarm set;in chair;with call bell/phone within reach;with family/visitor present Nurse Communication: Mobility status PT Visit Diagnosis: Other abnormalities of gait and mobility (R26.89);Muscle weakness (generalized)  (M62.81)     Time: 1610-9604 PT Time Calculation (min) (ACUTE ONLY): 29 min  Charges:    $Gait Training: 8-22 mins $Therapeutic Exercise: 8-22 mins PT General Charges $$ ACUTE PT VISIT: 1 Visit                     Blanchard Kelch PT Acute Rehabilitation Services Office [phone]     Rada Hay 12/21/2023, 1:15 PM

## 2023-12-22 NOTE — Therapy (Addendum)
 OUTPATIENT PHYSICAL THERAPY LOWER EXTREMITY EVALUATION   Patient Name: Wanda Gonzales MRN: [id number] DOB:1955/11/17, 68 y.o., female Today's Date: 12/25/2023  END OF SESSION:  PT End of Session - 12/25/23 1522     Visit Number 1    Number of Visits 20    Date for PT Re-Evaluation 03/04/24    Authorization Type blue medicare; [employer] pain    Progress Note Due on Visit 10    PT Start Time 1100    PT Stop Time 1141    PT Time Calculation (min) 41 min    Activity Tolerance Patient limited by pain    Behavior During Therapy [facility] for tasks assessed/performed             Past Medical History:  Diagnosis Date   Abnormal Pap smear, atypical squamous cells of undetermined sign (ASC-US ) 1998   treated with cryo, CIN I   ADD (attention deficit disorder) 02/23/2012   easily distracted.   Arthritis 02/23/2012   Rt. hip and lt. arm   Closed displaced fracture of fifth metatarsal bone of left foot 03/31/2017   Depression with anxiety    GERD (gastroesophageal reflux disease)    History of kidney stones    History of osteoporosis    PONV (postoperative nausea and vomiting) 02/23/2012   always has PONV   Post-nasal drainage 02/23/2012   frequent a problem,causes a cough and mucus buildup in throat   Urethral polyp 1998   Dr. Tommas Fragmin   Varicose veins    Venous insufficiency    chronic-denies any problems   Past Surgical History:  Procedure Laterality Date   blood clot removed Left 01/2012   blood clot removed from left hand    BREAST BIOPSY Right 02/14/2012   Fibroadenoma   COLONOSCOPY  08/2003   repeat 5 yrs.   COLONOSCOPY W/ BIOPSIES  12/2008   1 polyp recheck 5 yrs   EXCISION/RELEASE BURSA HIP  02/29/2012   HAND SURGERY  02/23/2012   "blood clot" excised palm left hand 01-18-12   KNEE ARTHROSCOPY W/ MENISCAL REPAIR  10/2012   KNEE ARTHROSCOPY WITH EXCISION BAKER'S CYST Left 10/2012   meniscal repair   left knee replacement      NASAL SINUS SURGERY     ORIF  METATARSAL FRACTURE Left 04/07/2017   Left 5th Metatarsal   scar tissue removed left knee      SHOULDER SURGERY Right 02/2015   Dr. 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   History of total knee arthroplasty, left 12/20/2023   Tremor of left hand 08/23/2023   Pelvic pressure in female 07/14/2023   Proteinuria 07/14/2023   Pelvic pain 07/14/2023   Irritant contact dermatitis 02/20/2023   IBS (irritable bowel syndrome with diarrhea) 08/17/2022   Hearing aid worn 02/10/2022   Trochanteric bursitis of left hip 02/06/2022   Frequent falls 11/26/2020   Depression with anxiety    History of osteoporosis    Age-related osteoporosis without current pathological fracture 07/29/2020   Osteoporosis 07/27/2020   Rhinitis 03/18/2020   Pain in left knee 09/07/2018  Seborrheic keratosis 11/23/2017   S/P TKR (total knee replacement), left 08/25/2016   Benign paroxysmal positional vertigo 08/25/2016   Mid back pain, chronic 12/16/2015   Cervical strain 11/21/2014   DDD (degenerative disc disease), lumbar 02/17/2014   Vitamin D deficiency 03/18/2013   Trochanteric bursitis of right hip 02/29/2012   INSOMNIA 04/13/2010   Hyperlipidemia 09/17/2007   CERVICAL POLYP 09/17/2007   Attention deficit disorder 09/11/2007   UNSPECIFIED VENOUS INSUFFICIENCY 08/29/2007    PCP: Dr Duaine German  REFERRING PROVIDER: Thurman Flores, PA-C  REFERRING DIAG: Physical Therapy post L knee polyethylene revision   THERAPY DIAG:  Chronic pain of left knee  Difficulty in walking, not elsewhere classified  Muscle weakness  (generalized)  Other symptoms and signs involving the musculoskeletal system  Rationale for Evaluation and Treatment: Rehabilitation  ONSET DATE: 12/20/23  SUBJECTIVE:   SUBJECTIVE STATEMENT: Patient reports that she underwent surgery for repair of L meniscus; L TKA 2019 with continued pain; debridement of scar tissue L knee summer 2024; L knee polyethylene revision 12/20/23  PERTINENT HISTORY: L TKA ~ 2019; L bBaker's cyst removal; L trochanteric bursitis; pelvic pain; HA; depression; anxiety  PAIN:  Are you having pain? Yes: NPRS scale: 7-8/10 Pain location: L knee  Pain description: throbbing; aching Aggravating factors: movement; bending and straightening knee  Relieving factors: lying down with leg stretched out; ice; pain meds   PRECAUTIONS: None  RED FLAGS: None   WEIGHT BEARING RESTRICTIONS: No  FALLS:  Has patient fallen in last 6 months? Yes. Number of falls 1 Fell getting out of bathtub scrap on elbow no injury to L knee   LIVING ENVIRONMENT: Lives with: lives with their spouse Lives in: House/apartment Stairs: Yes: External: 1 steps; none Has following equipment at home: Single point cane, Walker - 2 wheeled, shower chair, and Grab bars  OCCUPATION: retired Runner, broadcasting/film/video   PLOF: Independent  PATIENT GOALS: get better - decrease pain; using knee for activities   NEXT MD VISIT: 01/02/24  OBJECTIVE:  Note: Objective measures were completed at Evaluation unless otherwise noted.  DIAGNOSTIC FINDINGS: none available for knees   PATIENT SURVEYS:  LEFS 7/80; 8.8%   COGNITION: Overall cognitive status: Within functional limits for tasks assessed     SENSATION: WFL  EDEMA:  Minimal edema L knee   MUSCLE LENGTH: Hamstrings: Right 70 deg; Left 55 deg   POSTURE: rounded shoulders, forward head, flexed trunk , and weight shift right    LOWER EXTREMITY ROM:  Active ROM Right eval Left eval  Hip flexion    Hip extension    Hip abduction    Hip  adduction    Hip internal rotation    Hip external rotation    Knee flexion 133 45  Knee extension -+5 -6  Ankle dorsiflexion    Ankle plantarflexion    Ankle inversion    Ankle eversion     (Blank rows = not tested)  LOWER EXTREMITY MMT: assessed with pt in supine  MMT Right eval Left eval  Hip flexion  3-  Hip extension  3  Hip abduction  3  Hip adduction    Hip internal rotation    Hip external rotation    Knee flexion    Knee extension    Ankle dorsiflexion    Ankle plantarflexion    Ankle inversion    Ankle eversion     (Blank rows = not tested)  FUNCTIONAL TESTS:  Deferred due to nausea and pain   GAIT:  Distance walked: 40 ft  Assistive device utilized: Environmental consultant - 2 wheeled Level of assistance: Complete Independence Comments: decreased wt bearing L LE; poor wt shift to L; decreased hip/knee flexion with toe off to heel strike L; stride length asymmetrical                                                                                                                                 TREATMENT DATE: 12/25/23 Reviewed exercises from hospital including;  Quad set SLR Heel slide supine Knee flexion in sitting  Gait with rolling walker working on gait pattern - bending L knee toe off to heel strike L LE   PATIENT EDUCATION:  Education details: POC; HEP Person educated:Patient Education method: Explanation, Demonstration, Tactile cues, Verbal cues, and Handouts Education comprehension: verbalized understanding, returned demonstration, verbal cues required, tactile cues required, and needs further education  HOME EXERCISE PROGRAM: Access Code: [email or web address] URL: [email or web address] Date: 12/25/2023 Prepared by: Diogenes Whirley  Exercises - Supine Quad Set  - 2 x daily - 7 x weekly - 1 sets - 10 reps - 3 sec  hold - Small Range Straight Leg Raise  - 2 x daily - 7 x weekly - 1 sets - 10 reps - 5 sec  hold - Supine Heel Slide  - 2 x daily - 7 x weekly -  1-2 sets - 5 reps - 10 sec  hold - Seated Knee Flexion  - 2 x daily - 7 x weekly - 1 sets - 5-10 reps - 3-5 sec  hold  ASSESSMENT:  CLINICAL IMPRESSION: Patient is a 68 y.o. female who was seen today for physical therapy evaluation and treatment following L knee polyethylene revision following failed L TKA . She has had several L knee surgeries over the past 6 years. Patient has had persistent L knee pain and limitations in L knee function. Patient presents for evaluation reporting nausea and is unable to participate fully in evaluation. Patient has limited L LE ROM, strength, function. Patient has limited transitional movements; transfers; and bed mobility as well as dependent, abnormal gait pattern. She will benefit from PT to address problems identified.    OBJECTIVE IMPAIRMENTS: Abnormal gait, decreased activity tolerance, decreased mobility, decreased ROM, decreased strength, hypomobility, increased edema, increased fascial restrictions, impaired flexibility, improper body mechanics, postural dysfunction, and pain.   ACTIVITY LIMITATIONS: carrying, lifting, bending, sitting, standing, squatting, sleeping, stairs, transfers, bathing, toileting, dressing, and locomotion level  PARTICIPATION LIMITATIONS: meal prep, cleaning, laundry, driving, shopping, community activity, and occupation  PERSONAL FACTORS: Age, Behavior pattern, Education, Fitness, Time since onset of injury/illness/exacerbation, and  comorbidities: multiple surgeries L knee; chronic pain; LE weakness are also affecting patient's functional outcome.   REHAB POTENTIAL: Good  CLINICAL DECISION MAKING: Evolving/moderate complexity  EVALUATION COMPLEXITY: Moderate   GOALS: Goals reviewed with patient? Yes  SHORT TERM GOALS: Target date: 01/29/2024  Independent in initial HEP Baseline: Goal status: INITIAL  2.  Increase  AAROM to AROM L knee to 0 degrees extension to 100 degrees flexion  Baseline:  Goal status:  INITIAL  3.  Improved gail pattern with patient to demonstrate more equal wt bearing and wt shift L LE  Baseline:  Goal status: INITIAL   LONG TERM GOALS: Target date: 03/04/2024   Decrease pain by 50% with pain level no more than 4/10 with gait and transfers  Baseline:  Goal status: INITIAL  2.  Increase AROM L knee to 0 deg extension and 110 deg flexion  Baseline:  Goal status: INITIAL  3.  Increase strength to 4/5 to 4+/5 L LE  Baseline:  Goal status: INITIAL  4.  Independent gait with least restrictive assistive device and good gait pattern  Baseline:  Goal status: INITIAL  5.  Independent in HEP  Baseline:  Goal status: INITIAL  6.  Improve LEFS by 20 points  Baseline:  Goal status: INITIAL   PLAN:  PT FREQUENCY: 2x/week  PT DURATION: 10 weeks  PLANNED INTERVENTIONS: 97110-Therapeutic exercises, 97530- Therapeutic activity, 97112- Neuromuscular re-education, 97535- Self Care, 16109- Manual therapy, [phone]- Aquatic Therapy, [phone]- Subsequent splinting/medication, [phone]- Ionotophoresis 4mg /ml Dexamethasone, Patient/Family education, Balance training, Stair training, Taping, Dry Needling, Joint mobilization, Cryotherapy, and Moist heat  PLAN FOR NEXT SESSION: review and progress exercises; education re-knee pain and rehab process; manual work and modalities as indicated    Veryl Winemiller P Judeen Geralds, PT 12/25/2023, 3:25 PM

## 2023-12-25 ENCOUNTER — Encounter: Payer: Self-pay | Admitting: Rehabilitative and Restorative Service Providers"

## 2023-12-25 ENCOUNTER — Other Ambulatory Visit: Payer: Self-pay

## 2023-12-25 ENCOUNTER — Ambulatory Visit: Attending: Physician Assistant | Admitting: Rehabilitative and Restorative Service Providers"

## 2023-12-25 DIAGNOSIS — R262 Difficulty in walking, not elsewhere classified: Secondary | ICD-10-CM

## 2023-12-25 DIAGNOSIS — M6281 Muscle weakness (generalized): Secondary | ICD-10-CM | POA: Diagnosis not present

## 2023-12-25 DIAGNOSIS — M545 Low back pain, unspecified: Secondary | ICD-10-CM | POA: Insufficient documentation

## 2023-12-25 DIAGNOSIS — G8929 Other chronic pain: Secondary | ICD-10-CM

## 2023-12-25 DIAGNOSIS — R29898 Other symptoms and signs involving the musculoskeletal system: Secondary | ICD-10-CM | POA: Diagnosis not present

## 2023-12-25 DIAGNOSIS — M25562 Pain in left knee: Secondary | ICD-10-CM | POA: Diagnosis not present

## 2023-12-25 DIAGNOSIS — R296 Repeated falls: Secondary | ICD-10-CM | POA: Diagnosis not present

## 2023-12-27 ENCOUNTER — Ambulatory Visit

## 2023-12-27 DIAGNOSIS — R262 Difficulty in walking, not elsewhere classified: Secondary | ICD-10-CM | POA: Diagnosis not present

## 2023-12-27 DIAGNOSIS — M6281 Muscle weakness (generalized): Secondary | ICD-10-CM

## 2023-12-27 DIAGNOSIS — R296 Repeated falls: Secondary | ICD-10-CM | POA: Diagnosis not present

## 2023-12-27 DIAGNOSIS — G8929 Other chronic pain: Secondary | ICD-10-CM | POA: Diagnosis not present

## 2023-12-27 DIAGNOSIS — R29898 Other symptoms and signs involving the musculoskeletal system: Secondary | ICD-10-CM | POA: Diagnosis not present

## 2023-12-27 DIAGNOSIS — M25562 Pain in left knee: Secondary | ICD-10-CM | POA: Diagnosis not present

## 2023-12-27 DIAGNOSIS — M545 Low back pain, unspecified: Secondary | ICD-10-CM | POA: Diagnosis not present

## 2023-12-27 NOTE — Therapy (Signed)
 OUTPATIENT PHYSICAL THERAPY LOWER EXTREMITY TREATMENT   Patient Name: Wanda Gonzales MRN: [id number] DOB:December 25, 1955, 68 y.o., female Today's Date: 12/27/2023  END OF SESSION:  PT End of Session - 12/27/23 1149     Visit Number 2    Number of Visits 20    Date for PT Re-Evaluation 03/04/24    Authorization Type blue medicare; [employer]    Progress Note Due on Visit 10    PT Start Time 1148    PT Stop Time 1236    PT Time Calculation (min) 48 min    Activity Tolerance Patient tolerated treatment well    Behavior During Therapy WFL for tasks assessed/performed            Past Medical History:  Diagnosis Date   Abnormal Pap smear, atypical squamous cells of undetermined sign (ASC-US) 1998   treated with cryo, CIN I   ADD (attention deficit disorder) 02/23/2012   easily distracted.   Arthritis 02/23/2012   Rt. hip and lt. arm   Closed displaced fracture of fifth metatarsal bone of left foot 03/31/2017   Depression with anxiety    GERD (gastroesophageal reflux disease)    History of kidney stones    History of osteoporosis    PONV (postoperative nausea and vomiting) 02/23/2012   always has PONV   Post-nasal drainage 02/23/2012   frequent a problem,causes a cough and mucus buildup in throat   Urethral polyp 1998   Dr. Marcello Fennel   Varicose veins    Venous insufficiency    chronic-denies any problems   Past Surgical History:  Procedure Laterality Date   blood clot removed Left 01/2012   blood clot removed from left hand    BREAST BIOPSY Right 02/14/2012   Fibroadenoma   COLONOSCOPY  08/2003   repeat 5 yrs.   COLONOSCOPY W/ BIOPSIES  12/2008   1 polyp recheck 5 yrs   EXCISION/RELEASE BURSA HIP  02/29/2012   HAND SURGERY  02/23/2012   "blood clot" excised palm left hand 01-18-12   KNEE ARTHROSCOPY W/ MENISCAL REPAIR  10/2012   KNEE ARTHROSCOPY WITH EXCISION BAKER'S CYST Left 10/2012   meniscal repair   left knee replacement      NASAL SINUS SURGERY      ORIF METATARSAL FRACTURE Left 04/07/2017   Left 5th Metatarsal   scar tissue removed left knee      SHOULDER SURGERY Right 02/2015   Dr. 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   History of total knee arthroplasty, left 12/20/2023   Tremor of left hand 08/23/2023   Pelvic pressure in female 07/14/2023   Proteinuria 07/14/2023   Pelvic pain 07/14/2023   Irritant contact dermatitis 02/20/2023   IBS (irritable bowel syndrome with diarrhea) 08/17/2022   Hearing aid worn 02/10/2022   Trochanteric bursitis of left hip 02/06/2022   Frequent falls 11/26/2020   Depression with anxiety    History of osteoporosis    Age-related osteoporosis without current pathological fracture 07/29/2020   Osteoporosis 07/27/2020   Rhinitis 03/18/2020   Pain in left knee 09/07/2018   Seborrheic  keratosis 11/23/2017   S/P TKR (total knee replacement), left 08/25/2016   Benign paroxysmal positional vertigo 08/25/2016   Mid back pain, chronic 12/16/2015   Cervical strain 11/21/2014   DDD (degenerative disc disease), lumbar 02/17/2014   Vitamin D deficiency 03/18/2013   Trochanteric bursitis of right hip 02/29/2012   INSOMNIA 04/13/2010   Hyperlipidemia 09/17/2007   CERVICAL POLYP 09/17/2007   Attention deficit disorder 09/11/2007   UNSPECIFIED VENOUS INSUFFICIENCY 08/29/2007    PCP: Dr Nani Gasser  REFERRING PROVIDER: Alvina Chou, PA-C  REFERRING DIAG: Physical Therapy post L knee polyethylene revision   THERAPY DIAG:  Chronic pain of left knee  Difficulty in walking, not elsewhere classified  Muscle  weakness (generalized)  Other symptoms and signs involving the musculoskeletal system  Rationale for Evaluation and Treatment: Rehabilitation  ONSET DATE: 12/20/23  SUBJECTIVE:   SUBJECTIVE STATEMENT: Patient reports she is woken up in the middle of the night from L thigh "tremors". Patient states she will have bandage removed on Tuesday. Patient states 8/10 pain in knee; states she is able "to get up and down okay" but has not had much of an appetite from pain.    PERTINENT HISTORY: L TKA ~ 2019; L bBaker's cyst removal; L trochanteric bursitis; pelvic pain; HA; depression; anxiety  PAIN:  Are you having pain? Yes: NPRS scale: 7-8/10 Pain location: L knee  Pain description: throbbing; aching Aggravating factors: movement; bending and straightening knee  Relieving factors: lying down with leg stretched out; ice; pain meds   PRECAUTIONS: None  RED FLAGS: None   WEIGHT BEARING RESTRICTIONS: No  FALLS:  Has patient fallen in last 6 months? Yes. Number of falls 1 Fell getting out of bathtub scrap on elbow no injury to L knee   LIVING ENVIRONMENT: Lives with: lives with their spouse Lives in: House/apartment Stairs: Yes: External: 1 steps; none Has following equipment at home: Single point cane, Walker - 2 wheeled, shower chair, and Grab bars  OCCUPATION: retired Runner, broadcasting/film/video   PLOF: Independent  PATIENT GOALS: get better - decrease pain; using knee for activities   NEXT MD VISIT: 01/02/24  OBJECTIVE:  Note: Objective measures were completed at Evaluation unless otherwise noted.  DIAGNOSTIC FINDINGS: none available for knees   PATIENT SURVEYS:  LEFS 7/80; 8.8%   COGNITION: Overall cognitive status: Within functional limits for tasks assessed     SENSATION: WFL  EDEMA:  Minimal edema L knee   MUSCLE LENGTH: Hamstrings: Right 70 deg; Left 55 deg   POSTURE: rounded shoulders, forward head, flexed trunk , and weight shift right    LOWER EXTREMITY ROM:  Active  ROM Right eval Left eval  Hip flexion    Hip extension    Hip abduction    Hip adduction    Hip internal rotation    Hip external rotation    Knee flexion 45 133  Knee extension -6 +5  Ankle dorsiflexion    Ankle plantarflexion    Ankle inversion    Ankle eversion     (Blank rows = not tested)  LOWER EXTREMITY MMT: assessed with pt in supine  MMT Right eval Left eval  Hip flexion  3-  Hip extension  3  Hip abduction  3  Hip adduction    Hip internal rotation    Hip external rotation    Knee flexion    Knee extension    Ankle dorsiflexion    Ankle plantarflexion    Ankle inversion    Ankle eversion     (  Blank rows = not tested)  FUNCTIONAL TESTS:  Deferred due to nausea and pain   GAIT: Distance walked: 40 ft  Assistive device utilized: Walker - 2 wheeled Level of assistance: Complete Independence Comments: decreased wt bearing L LE; poor wt shift to L; decreased hip/knee flexion with toe off to heel strike L; stride length asymmetrical    [facility] Adult PT Treatment:                                                DATE: 12/27/2023 Therapeutic Exercise: NuStep L1 x 5 min Passive knee flexion  SLR with strap assist x10 Seated knee flexion Neuromuscular re-ed: Supine quad set Small range SLR with active assist --> tactile cues needed to stabilize pelvis Modalities: Vaso L knee. Light compression, 34 degrees x 10 min                                                                                                                                TREATMENT DATE: 12/25/23 Reviewed exercises from hospital including;  Quad set SLR Heel slide supine Knee flexion in sitting  Gait with rolling walker working on gait pattern - bending L knee toe off to heel strike L LE   PATIENT EDUCATION:  Education details: POC; HEP Person educated:Patient Education method: Explanation, Demonstration, Tactile cues, Verbal cues, and Handouts Education comprehension: verbalized  understanding, returned demonstration, verbal cues required, tactile cues required, and needs further education  HOME EXERCISE PROGRAM: Access Code: [email or web address] URL: [email or web address] Date: 12/25/2023 Prepared by: Corlis Leak  Exercises - Supine Quad Set  - 2 x daily - 7 x weekly - 1 sets - 10 reps - 3 sec  hold - Small Range Straight Leg Raise  - 2 x daily - 7 x weekly - 1 sets - 10 reps - 5 sec  hold - Supine Heel Slide  - 2 x daily - 7 x weekly - 1-2 sets - 5 reps - 10 sec  hold - Seated Knee Flexion  - 2 x daily - 7 x weekly - 1 sets - 5-10 reps - 3-5 sec  hold  ASSESSMENT:  CLINICAL IMPRESSION: Increased tremors noted on R LE during assisted straight leg raises. Patient had difficulty with active and passive knee mobility exercises due to high level of pain. Recommended patient try rolling/massaging quads prior to bed to see if it alleviates leg tremors while sleeping.   EVAL: Patient is a 68 y.o. female who was seen today for physical therapy evaluation and treatment following L knee polyethylene revision following failed L TKA . She has had several L knee surgeries over the past 6 years. Patient has had persistent L knee pain and limitations in L knee function. Patient presents for evaluation reporting nausea and is unable to participate fully in evaluation. Patient  has limited L LE ROM, strength, function. Patient has limited transitional movements; transfers; and bed mobility as well as dependent, abnormal gait pattern. She will benefit from PT to address problems identified.    OBJECTIVE IMPAIRMENTS: Abnormal gait, decreased activity tolerance, decreased mobility, decreased ROM, decreased strength, hypomobility, increased edema, increased fascial restrictions, impaired flexibility, improper body mechanics, postural dysfunction, and pain.   ACTIVITY LIMITATIONS: carrying, lifting, bending, sitting, standing, squatting, sleeping, stairs, transfers, bathing, toileting,  dressing, and locomotion level  PARTICIPATION LIMITATIONS: meal prep, cleaning, laundry, driving, shopping, community activity, and occupation  PERSONAL FACTORS: Age, Behavior pattern, Education, Fitness, Time since onset of injury/illness/exacerbation, and  comorbidities: multiple surgeries L knee; chronic pain; LE weakness are also affecting patient's functional outcome.   REHAB POTENTIAL: Good  CLINICAL DECISION MAKING: Evolving/moderate complexity  EVALUATION COMPLEXITY: Moderate   GOALS: Goals reviewed with patient? Yes  SHORT TERM GOALS: Target date: 01/29/2024  Independent in initial HEP Baseline: Goal status: INITIAL  2.  Increase AAROM to AROM L knee to 0 degrees extension to 100 degrees flexion  Baseline:  Goal status: INITIAL  3.  Improved gail pattern with patient to demonstrate more equal wt bearing and wt shift L LE  Baseline:  Goal status: INITIAL   LONG TERM GOALS: Target date: 03/04/2024  Decrease pain by 50% with pain level no more than 4/10 with gait and transfers  Baseline:  Goal status: INITIAL  2.  Increase AROM L knee to 0 deg extension and 110 deg flexion  Baseline:  Goal status: INITIAL  3.  Increase strength to 4/5 to 4+/5 L LE  Baseline:  Goal status: INITIAL  4.  Independent gait with least restrictive assistive device and good gait pattern  Baseline:  Goal status: INITIAL  5.  Independent in HEP  Baseline:  Goal status: INITIAL  6.  Improve LEFS by 20 points  Baseline:  Goal status: INITIAL   PLAN:  PT FREQUENCY: 2x/week  PT DURATION: 10 weeks  PLANNED INTERVENTIONS: 97110-Therapeutic exercises, 97530- Therapeutic activity, 97112- Neuromuscular re-education, 97535- Self Care, 16109- Manual therapy, [phone]- Aquatic Therapy, [phone]- Subsequent splinting/medication, [phone]- Ionotophoresis 4mg /ml Dexamethasone, Patient/Family education, Balance training, Stair training, Taping, Dry Needling, Joint mobilization, Cryotherapy, and Moist  heat  PLAN FOR NEXT SESSION: review and progress exercises; education re-knee pain and rehab process; manual work and modalities as indicated    Sanjuana Mae, PTA 12/27/2023, 12:38 PM

## 2024-01-01 ENCOUNTER — Encounter: Payer: Self-pay | Admitting: Rehabilitative and Restorative Service Providers"

## 2024-01-01 ENCOUNTER — Ambulatory Visit: Admitting: Rehabilitative and Restorative Service Providers"

## 2024-01-01 DIAGNOSIS — R262 Difficulty in walking, not elsewhere classified: Secondary | ICD-10-CM | POA: Diagnosis not present

## 2024-01-01 DIAGNOSIS — M545 Low back pain, unspecified: Secondary | ICD-10-CM | POA: Diagnosis not present

## 2024-01-01 DIAGNOSIS — M25562 Pain in left knee: Secondary | ICD-10-CM | POA: Diagnosis not present

## 2024-01-01 DIAGNOSIS — G8929 Other chronic pain: Secondary | ICD-10-CM

## 2024-01-01 DIAGNOSIS — R296 Repeated falls: Secondary | ICD-10-CM | POA: Diagnosis not present

## 2024-01-01 DIAGNOSIS — R29898 Other symptoms and signs involving the musculoskeletal system: Secondary | ICD-10-CM | POA: Diagnosis not present

## 2024-01-01 DIAGNOSIS — M6281 Muscle weakness (generalized): Secondary | ICD-10-CM | POA: Diagnosis not present

## 2024-01-01 NOTE — Therapy (Signed)
 OUTPATIENT PHYSICAL THERAPY LOWER EXTREMITY TREATMENT   Patient Name: Wanda Gonzales MRN: [id number] DOB:09-24-1955, 68 y.o., female Today's Date: 01/01/2024  END OF SESSION:  PT End of Session - 01/01/24 1150     Visit Number 3    Number of Visits 20    Date for PT Re-Evaluation 03/04/24    Authorization Type blue medicare; [employer] state [employer] pain    Progress Note Due on Visit 10    PT Start Time 1148    PT Stop Time 1235    PT Time Calculation (min) 47 min    Activity Tolerance Patient tolerated treatment well            Past Medical History:  Diagnosis Date   Abnormal Pap smear, atypical squamous cells of undetermined sign (ASC-US) 1998   treated with cryo, CIN I   ADD (attention deficit disorder) 02/23/2012   easily distracted.   Arthritis 02/23/2012   Rt. hip and lt. arm   Closed displaced fracture of fifth metatarsal bone of left foot 03/31/2017   Depression with anxiety    GERD (gastroesophageal reflux disease)    History of kidney stones    History of osteoporosis    PONV (postoperative nausea and vomiting) 02/23/2012   always has PONV   Post-nasal drainage 02/23/2012   frequent a problem,causes a cough and mucus buildup in throat   Urethral polyp 1998   Dr. Marcello Fennel   Varicose veins    Venous insufficiency    chronic-denies any problems   Past Surgical History:  Procedure Laterality Date   blood clot removed Left 01/2012   blood clot removed from left hand    BREAST BIOPSY Right 02/14/2012   Fibroadenoma   COLONOSCOPY  08/2003   repeat 5 yrs.   COLONOSCOPY W/ BIOPSIES  12/2008   1 polyp recheck 5 yrs   EXCISION/RELEASE BURSA HIP  02/29/2012   HAND SURGERY  02/23/2012   "blood clot" excised palm left hand 01-18-12   KNEE ARTHROSCOPY W/ MENISCAL REPAIR  10/2012   KNEE ARTHROSCOPY WITH EXCISION BAKER'S CYST Left 10/2012   meniscal repair   left knee replacement      NASAL SINUS SURGERY     ORIF METATARSAL FRACTURE Left 04/07/2017   Left 5th  Metatarsal   scar tissue removed left knee      SHOULDER SURGERY Right 02/2015   Dr. 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   History of total knee arthroplasty, left 12/20/2023   Tremor of left hand 08/23/2023   Pelvic pressure in female 07/14/2023   Proteinuria 07/14/2023   Pelvic pain 07/14/2023   Irritant contact dermatitis 02/20/2023   IBS (irritable bowel syndrome with diarrhea) 08/17/2022   Hearing aid worn 02/10/2022   Trochanteric bursitis of left hip 02/06/2022   Frequent falls 11/26/2020   Depression with anxiety    History of osteoporosis    Age-related osteoporosis without current pathological fracture 07/29/2020   Osteoporosis 07/27/2020   Rhinitis 03/18/2020   Pain in left knee 09/07/2018   Seborrheic keratosis 11/23/2017   S/P TKR (total knee replacement), left  08/25/2016   Benign paroxysmal positional vertigo 08/25/2016   Mid back pain, chronic 12/16/2015   Cervical strain 11/21/2014   DDD (degenerative disc disease), lumbar 02/17/2014   Vitamin D deficiency 03/18/2013   Trochanteric bursitis of right hip 02/29/2012   INSOMNIA 04/13/2010   Hyperlipidemia 09/17/2007   CERVICAL POLYP 09/17/2007   Attention deficit disorder 09/11/2007   UNSPECIFIED VENOUS INSUFFICIENCY 08/29/2007    PCP: Dr Nani Gasser  REFERRING PROVIDER: Alvina Chou, PA-C  REFERRING DIAG: Physical Therapy post L knee polyethylene revision   THERAPY DIAG:  Chronic pain of left knee  Difficulty in walking, not elsewhere classified  Muscle weakness (generalized)  Other symptoms and signs  involving the musculoskeletal system  Rationale for Evaluation and Treatment: Rehabilitation  ONSET DATE: 12/20/23  SUBJECTIVE:   SUBJECTIVE STATEMENT: Patient reports that she has had a reaction to the pain medication over the weekend. Has not felt like doing any exercises. She sees the surgeon tomorrow and will get the bandage off tomorrow. She thinks the bandage limits her ROM. She "pulled a butt muscle last week" in therapy. Put ice on it and it resolved in about 3 days   Patient reports she is woken up in the middle of the night from L thigh "tremors". Patient states she will have bandage removed on Tuesday. Patient states 8/10 pain in knee; states she is able "to get up and down okay" but has not had much of an appetite from pain.    PERTINENT HISTORY: L TKA ~ 2019; L bBaker's cyst removal; L trochanteric bursitis; pelvic pain; HA; depression; anxiety  PAIN:  Are you having pain? Yes: NPRS scale: 4/10 Pain location: L knee - mostly back of knee area  Pain description: throbbing; aching Aggravating factors: movement; bending and straightening knee  Relieving factors: lying down with leg stretched out; ice; pain meds   PRECAUTIONS: None  WEIGHT BEARING RESTRICTIONS: No  FALLS:  Has patient fallen in last 6 months? Yes. Number of falls 1 Fell getting out of bathtub scrap on elbow no injury to L knee   LIVING ENVIRONMENT: Lives with: lives with their spouse Lives in: House/apartment Stairs: Yes: External: 1 steps; none Has following equipment at home: Single point cane, Walker - 2 wheeled, shower chair, and Grab bars  OCCUPATION: retired Runner, broadcasting/film/video   PLOF: Independent  PATIENT GOALS: get better - decrease pain; using knee for activities   NEXT MD VISIT: 01/02/24  OBJECTIVE:  Note: Objective measures were completed at Evaluation unless otherwise noted.  DIAGNOSTIC FINDINGS: none available for knees   PATIENT SURVEYS:  LEFS 7/80; 8.8%     SENSATION: WFL  EDEMA:   Minimal edema L knee   MUSCLE LENGTH: Hamstrings: Right 70 deg; Left 55 deg   POSTURE: rounded shoulders, forward head, flexed trunk , and weight shift right    LOWER EXTREMITY ROM:  Active ROM Right  eval Left  Eval  Left 01/01/24  Hip flexion     Hip extension     Hip abduction     Hip adduction     Hip internal rotation     Hip external rotation     Knee flexion 133 45 106  Knee extension +5 -6 -3  Ankle dorsiflexion     Ankle plantarflexion     Ankle inversion     Ankle eversion      (Blank rows = not tested)  LOWER EXTREMITY MMT: assessed with pt in supine  MMT Right eval Left eval  Hip flexion  3-  Hip extension  3  Hip abduction  3  Hip adduction    Hip internal rotation    Hip external rotation    Knee flexion    Knee extension    Ankle dorsiflexion    Ankle plantarflexion    Ankle inversion    Ankle eversion     (Blank rows = not tested)  FUNCTIONAL TESTS:  Deferred due to nausea and pain   GAIT: Distance walked: 40 ft  Assistive device utilized: Environmental consultant - 2 wheeled Level of assistance: Complete Independence Comments: decreased wt bearing L LE; poor wt shift to L; decreased hip/knee flexion with toe off to heel strike L; stride length asymmetrical    [facility] Adult PT Treatment:                                                DATE: 01/01/2024 Therapeutic Exercise: NuStep L5 x 5 min Passive knee flexion supine heel slide with strap 5-10 sec hold x 10  Therapeutic activity: Supine quad set Supine SLR 3 sec x 10  SAQ on green bolster 3 sec x 10  L Hip abduction in R sidelying 3 sec x 10  AAROM L knee with both heels resting on green therapy ball pt assist with stretch out strap 10 sec x 10  Sit to stand equal weight bearing use of UEs as needed to initiate movement from mat table x 10   Modalities: Vaso L knee. Light compression, 34 degrees x 10 min   [facility] Adult PT Treatment:                                                DATE:  12/27/2023 Therapeutic Exercise: NuStep L1 x 5 min Passive knee flexion  SLR with strap assist x10 Seated knee flexion Neuromuscular re-ed: Supine quad set Small range SLR with active assist --> tactile cues needed to stabilize pelvis Modalities: Vaso L knee. Light compression, 34 degrees x 10 min                                                                                                                                TREATMENT DATE: 12/25/23 Reviewed exercises from hospital including;  Quad set SLR Heel slide supine Knee flexion in sitting  Gait with rolling walker working on gait pattern - bending L knee toe off to heel strike L LE   PATIENT EDUCATION:  Education details: POC; HEP Person educated:Patient Education method: Explanation, Demonstration, Tactile cues, Verbal cues, and Handouts Education comprehension: verbalized understanding, returned demonstration, verbal cues required, tactile cues required, and needs further education  HOME EXERCISE PROGRAM: Access Code: [email or web address] URL:  [email or web address] Date: 12/25/2023 Prepared by: Sherley Leser  Exercises - Supine Quad Set  - 2 x daily - 7 x weekly - 1 sets - 10 reps - 3 sec  hold - Small Range Straight Leg Raise  - 2 x daily - 7 x weekly - 1 sets - 10 reps - 5 sec  hold - Supine Heel Slide  - 2 x daily - 7 x weekly - 1-2 sets - 5 reps - 10 sec  hold - Seated Knee Flexion  - 2 x daily - 7 x weekly - 1 sets - 5-10 reps - 3-5 sec  hold  ASSESSMENT:  CLINICAL IMPRESSION: Patient returns to clinic ambulating without assistive device. She demonstrated increased exercise tolerance today. Note good gains in A to AAROM L knee.   EVAL: Patient is a 68 y.o. female who was seen today for physical therapy evaluation and treatment following L knee polyethylene revision following failed L TKA . She has had several L knee surgeries over the past 6 years. Patient has had persistent L knee pain and limitations in L knee  function. Patient presents for evaluation reporting nausea and is unable to participate fully in evaluation. Patient has limited L LE ROM, strength, function. Patient has limited transitional movements; transfers; and bed mobility as well as dependent, abnormal gait pattern. She will benefit from PT to address problems identified.    OBJECTIVE IMPAIRMENTS: Abnormal gait, decreased activity tolerance, decreased mobility, decreased ROM, decreased strength, hypomobility, increased edema, increased fascial restrictions, impaired flexibility, improper body mechanics, postural dysfunction, and pain.    GOALS: Goals reviewed with patient? Yes  SHORT TERM GOALS: Target date: 01/29/2024  Independent in initial HEP Baseline: Goal status: INITIAL  2.  Increase AAROM to AROM L knee to 0 degrees extension to 100 degrees flexion  Baseline:  Goal status: INITIAL  3.  Improved gail pattern with patient to demonstrate more equal wt bearing and wt shift L LE  Baseline:  Goal status: INITIAL   LONG TERM GOALS: Target date: 03/04/2024  Decrease pain by 50% with pain level no more than 4/10 with gait and transfers  Baseline:  Goal status: INITIAL  2.  Increase AROM L knee to 0 deg extension and 110 deg flexion  Baseline:  Goal status: INITIAL  3.  Increase strength to 4/5 to 4+/5 L LE  Baseline:  Goal status: INITIAL  4.  Independent gait with least restrictive assistive device and good gait pattern  Baseline:  Goal status: INITIAL  5.  Independent in HEP  Baseline:  Goal status: INITIAL  6.  Improve LEFS by 20 points  Baseline:  Goal status: INITIAL   PLAN:  PT FREQUENCY: 2x/week  PT DURATION: 10 weeks  PLANNED INTERVENTIONS: 97110-Therapeutic exercises, 97530- Therapeutic activity, 97112- Neuromuscular re-education, 97535- Self Care, 16109- Manual therapy, [phone]- Aquatic Therapy, [phone]- Subsequent splinting/medication, [phone]- Ionotophoresis 4mg /ml Dexamethasone, Patient/Family  education, Balance training, Stair training, Taping, Dry Needling, Joint mobilization, Cryotherapy, and Moist heat  PLAN FOR NEXT SESSION: review and progress exercises; education re-knee pain and rehab process; manual work and modalities as indicated    Taren Dymek P Jenise Iannelli, PT 01/01/2024, 11:51 AM

## 2024-01-04 ENCOUNTER — Ambulatory Visit: Admitting: Rehabilitative and Restorative Service Providers"

## 2024-01-04 ENCOUNTER — Encounter: Payer: Self-pay | Admitting: Rehabilitative and Restorative Service Providers"

## 2024-01-04 DIAGNOSIS — R262 Difficulty in walking, not elsewhere classified: Secondary | ICD-10-CM | POA: Diagnosis not present

## 2024-01-04 DIAGNOSIS — M6281 Muscle weakness (generalized): Secondary | ICD-10-CM

## 2024-01-04 DIAGNOSIS — M25562 Pain in left knee: Secondary | ICD-10-CM | POA: Diagnosis not present

## 2024-01-04 DIAGNOSIS — G8929 Other chronic pain: Secondary | ICD-10-CM

## 2024-01-04 DIAGNOSIS — R29898 Other symptoms and signs involving the musculoskeletal system: Secondary | ICD-10-CM

## 2024-01-04 DIAGNOSIS — R296 Repeated falls: Secondary | ICD-10-CM | POA: Diagnosis not present

## 2024-01-04 DIAGNOSIS — M545 Low back pain, unspecified: Secondary | ICD-10-CM | POA: Diagnosis not present

## 2024-01-04 NOTE — Therapy (Signed)
 OUTPATIENT PHYSICAL THERAPY LOWER EXTREMITY TREATMENT   Patient Name: Wanda Gonzales MRN: [id number] DOB:[birth year], 68 y.o., female Today's Date: 01/04/2024  END OF SESSION:  PT End of Session - 01/04/24 1318     Visit Number 5    Number of Visits 20    Date for PT Re-Evaluation 03/04/24    Authorization Type [employer]; [employer]    Progress Note Due on Visit 10    PT Start Time 1315    PT Stop Time 1400    PT Time Calculation (min) 45 min    Activity Tolerance Patient tolerated treatment well            Past Medical History:  Diagnosis Date   Abnormal Pap smear, atypical squamous cells of undetermined sign (ASC-US) 1998   treated with cryo, CIN I   ADD (attention deficit disorder) 02/23/2012   easily distracted.   Arthritis 02/23/2012   Rt. hip and lt. arm   Closed displaced fracture of fifth metatarsal bone of left foot 03/31/2017   Depression with anxiety    GERD (gastroesophageal reflux disease)    History of kidney stones    History of osteoporosis    PONV (postoperative nausea and vomiting) 02/23/2012   always has PONV   Post-nasal drainage 02/23/2012   frequent a problem,causes a cough and mucus buildup in throat   Urethral polyp 1998   Dr. Marcello Fennel   Varicose veins    Venous insufficiency    chronic-denies any problems   Past Surgical History:  Procedure Laterality Date   blood clot removed Left 01/2012   blood clot removed from left hand    BREAST BIOPSY Right 02/14/2012   Fibroadenoma   COLONOSCOPY  08/2003   repeat 5 yrs.   COLONOSCOPY W/ BIOPSIES  12/2008   1 polyp recheck 5 yrs   EXCISION/RELEASE BURSA HIP  02/29/2012   HAND SURGERY  02/23/2012   "blood clot" excised palm left hand 01-18-12   KNEE ARTHROSCOPY W/ MENISCAL REPAIR  10/2012   KNEE ARTHROSCOPY WITH EXCISION BAKER'S CYST Left 10/2012   meniscal repair   left knee replacement      NASAL SINUS SURGERY     ORIF METATARSAL FRACTURE Left 04/07/2017   Left 5th  Metatarsal   scar tissue removed left knee      SHOULDER SURGERY Right 02/2015   Dr. 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   History of total knee arthroplasty, left 12/20/2023   Tremor of left hand 08/23/2023   Pelvic pressure in female 07/14/2023   Proteinuria 07/14/2023   Pelvic pain 07/14/2023   Irritant contact dermatitis 02/20/2023   IBS (irritable bowel syndrome with diarrhea) 08/17/2022   Hearing aid worn 02/10/2022   Trochanteric bursitis of left hip 02/06/2022   Frequent falls 11/26/2020   Depression with anxiety    History of osteoporosis    Age-related osteoporosis without current pathological fracture 07/29/2020   Osteoporosis 07/27/2020   Rhinitis 03/18/2020   Pain in left knee 09/07/2018   Seborrheic keratosis 11/23/2017   S/P TKR (total knee replacement), left  08/25/2016   Benign paroxysmal positional vertigo 08/25/2016   Mid back pain, chronic 12/16/2015   Cervical strain 11/21/2014   DDD (degenerative disc disease), lumbar 02/17/2014   Vitamin D deficiency 03/18/2013   Trochanteric bursitis of right hip 02/29/2012   INSOMNIA 04/13/2010   Hyperlipidemia 09/17/2007   CERVICAL POLYP 09/17/2007   Attention deficit disorder 09/11/2007   UNSPECIFIED VENOUS INSUFFICIENCY 08/29/2007    PCP: Dr Duaine German  REFERRING PROVIDER: Thurman Flores, PA-C  REFERRING DIAG: Physical Therapy post L knee polyethylene revision   THERAPY DIAG:  Chronic pain of left knee  Difficulty in walking, not elsewhere classified  Muscle weakness (generalized)  Other symptoms and signs  involving the musculoskeletal system  Rationale for Evaluation and Treatment: Rehabilitation  ONSET DATE: ~ 3 weeks from visit 01/02/24  SUBJECTIVE:   SUBJECTIVE STATEMENT: Patient reports that she got a great report from Dr Dante Dyer yesterday. He is pleased with her progress and told her she only needs 3 more PT visits. She sees him again in 3 weeks. Pleased that her husband also got great news that he has no cancer. She has tried to work on some exercises. Had some increased pain last night. Used ice last night.    Patient reports she is woken up in the middle of the night from L thigh "tremors". Patient states she will have bandage removed on Tuesday. Patient states 8/10 pain in knee; states she is able "to get up and down okay" but has not had much of an appetite from pain.    PERTINENT HISTORY: L TKA ~ 2019; L bBaker's cyst removal; L trochanteric bursitis; pelvic pain; HA; depression; anxiety  PAIN:  Are you having pain? Yes: NPRS scale: 3-4/10 Pain location: L knee - mostly back of knee area  Pain description: throbbing; aching Aggravating factors: movement; bending and straightening knee  Relieving factors: lying down with leg stretched out; ice; pain meds   PRECAUTIONS: None  WEIGHT BEARING RESTRICTIONS: No  FALLS:  Has patient fallen in last 6 months? Yes. Number of falls 1 Fell getting out of bathtub scrap on elbow no injury to L knee   LIVING ENVIRONMENT: Lives with: lives with their spouse Lives in: House/apartment Stairs: Yes: External: 1 steps; none Has following equipment at home: Single point cane, Walker - 2 wheeled, shower chair, and Grab bars  OCCUPATION: retired Runner, broadcasting/film/video   PATIENT GOALS: get better - decrease pain; using knee for activities   NEXT MD VISIT: 01/02/24  OBJECTIVE:  Note: Objective measures were completed at Evaluation unless otherwise noted.  DIAGNOSTIC FINDINGS: none available for knees   PATIENT SURVEYS:  LEFS 7/80; 8.8%      SENSATION: WFL  EDEMA:  Minimal edema L knee   MUSCLE LENGTH: Hamstrings: Right 70 deg; Left 55 deg   POSTURE: rounded shoulders, forward head, flexed trunk , and weight shift right    LOWER EXTREMITY ROM:  Active ROM Right  eval Left  Eval  Left 01/01/24  Hip flexion     Hip extension     Hip abduction     Hip adduction     Hip internal rotation     Hip external rotation     Knee flexion 133 45 106  Knee extension +5 -6 -3  Ankle dorsiflexion     Ankle plantarflexion     Ankle inversion     Ankle eversion      (Blank rows = not tested)  LOWER EXTREMITY MMT: assessed with pt in  supine  MMT Right eval Left eval  Hip flexion  3-  Hip extension  3  Hip abduction  3  Hip adduction    Hip internal rotation    Hip external rotation    Knee flexion    Knee extension    Ankle dorsiflexion    Ankle plantarflexion    Ankle inversion    Ankle eversion     (Blank rows = not tested)  FUNCTIONAL TESTS:  Deferred due to nausea and pain   GAIT: Distance walked: 40 ft  Assistive device utilized: Environmental consultant - 2 wheeled Level of assistance: Complete Independence Comments: decreased wt bearing L LE; poor wt shift to L; decreased hip/knee flexion with toe off to heel strike L; stride length asymmetrical    [facility] Adult PT Treatment:                                                DATE: 01/04/2024 Therapeutic Exercise: NuStep L6 x 6 min Passive knee flexion supine heel slide with heel on pillowcase 5-10 sec hold x 10  Therapeutic activity: Step up 4 inch step L 10 x 2  Hip extension standing 10 x 2 L/R  Hip abduction standing 10 x 2 L/R Supine quad set to SLR 3 sec x 5 x 4 sets   SAQ on green bolster 3 sec x 10 x 2 L Hip abduction in R sidelying 3 sec x 10 x 2 AAROM L knee with both heels resting on green therapy ball pt assist with stretch out strap 10 sec x 10  Sit to stand equal weight bearing use of UEs as needed to initiate movement from mat table x 10    Gait: Working on gait forward and back - patient has lateral shift of upper body to R with wt bearing L    [facility] Adult PT Treatment:                                                DATE: 01/01/2024 Therapeutic Exercise: NuStep L5 x 5 min Passive knee flexion supine heel slide with strap 5-10 sec hold x 10  Therapeutic activity: Supine quad set Supine SLR 3 sec x 10  SAQ on green bolster 3 sec x 10  L Hip abduction in R sidelying 3 sec x 10  AAROM L knee with both heels resting on green therapy ball pt assist with stretch out strap 10 sec x 10  Sit to stand equal weight bearing use of UEs as needed to initiate movement from mat table x 10   Modalities: Vaso L knee. Light compression, 34 degrees x 10 min   [facility] Adult PT Treatment:                                                DATE: 12/27/2023 Therapeutic Exercise: NuStep L1 x 5 min Passive knee flexion  SLR with strap assist x10 Seated knee flexion Neuromuscular re-ed: Supine quad set Small range SLR with active assist --> tactile cues needed to stabilize pelvis Modalities: Vaso L knee. Light compression, 34 degrees  x 10 min                                                                                                                                TREATMENT DATE: 12/25/23 Reviewed exercises from hospital including;  Quad set SLR Heel slide supine Knee flexion in sitting  Gait with rolling walker working on gait pattern - bending L knee toe off to heel strike L LE   PATIENT EDUCATION:  Education details: POC; HEP Person educated:Patient Education method: Explanation, Demonstration, Tactile cues, Verbal cues, and Handouts Education comprehension: verbalized understanding, returned demonstration, verbal cues required, tactile cues required, and needs further education  HOME EXERCISE PROGRAM: Access Code: [email or web address] URL: [email or web address] Date: 12/25/2023 Prepared by: Makila Colombe  Exercises - Supine  Quad Set  - 2 x daily - 7 x weekly - 1 sets - 10 reps - 3 sec  hold - Small Range Straight Leg Raise  - 2 x daily - 7 x weekly - 1 sets - 10 reps - 5 sec  hold - Supine Heel Slide  - 2 x daily - 7 x weekly - 1-2 sets - 5 reps - 10 sec  hold - Seated Knee Flexion  - 2 x daily - 7 x weekly - 1 sets - 5-10 reps - 3-5 sec  hold  ASSESSMENT:  CLINICAL IMPRESSION: Patient continued with  LE strengthening adding standing exercises. Patient fatigued quickly with standing exercises. Continues to work on gait without assistive device. Progressing well toward goals of therapy and independent home management.     EVAL: Patient is a 68 y.o. female who was seen today for physical therapy evaluation and treatment following L knee polyethylene revision following failed L TKA . She has had several L knee surgeries over the past 6 years. Patient has had persistent L knee pain and limitations in L knee function. Patient presents for evaluation reporting nausea and is unable to participate fully in evaluation. Patient has limited L LE ROM, strength, function. Patient has limited transitional movements; transfers; and bed mobility as well as dependent, abnormal gait pattern. She will benefit from PT to address problems identified.    OBJECTIVE IMPAIRMENTS: Abnormal gait, decreased activity tolerance, decreased mobility, decreased ROM, decreased strength, hypomobility, increased edema, increased fascial restrictions, impaired flexibility, improper body mechanics, postural dysfunction, and pain.    GOALS: Goals reviewed with patient? Yes  SHORT TERM GOALS: Target date: 01/29/2024  Independent in initial HEP Baseline: Goal status: INITIAL  2.  Increase AAROM to AROM L knee to 0 degrees extension to 100 degrees flexion  Baseline:  Goal status: INITIAL  3.  Improved gail pattern with patient to demonstrate more equal wt bearing and wt shift L LE  Baseline:  Goal status: INITIAL   LONG TERM GOALS: Target date:  03/04/2024  Decrease pain by 50% with pain level no more than 4/10 with gait and transfers  Baseline:  Goal status: INITIAL  2.  Increase AROM L knee to 0 deg extension and 110 deg flexion  Baseline:  Goal status: INITIAL  3.  Increase strength to 4/5 to 4+/5 L LE  Baseline:  Goal status: INITIAL  4.  Independent gait with least restrictive assistive device and good gait pattern  Baseline:  Goal status: INITIAL  5.  Independent in HEP  Baseline:  Goal status: INITIAL  6.  Improve LEFS by 20 points  Baseline:  Goal status: INITIAL   PLAN:  PT FREQUENCY: 2x/week  PT DURATION: 10 weeks  PLANNED INTERVENTIONS: 97110-Therapeutic exercises, 97530- Therapeutic activity, 97112- Neuromuscular re-education, 97535- Self Care, [id number]- Manual therapy, [phone]- Aquatic Therapy, [phone]- Subsequent splinting/medication, [phone]- Ionotophoresis 4mg /ml Dexamethasone, Patient/Family education, Balance training, Stair training, Taping, Dry Needling, Joint mobilization, Cryotherapy, and Moist heat  PLAN FOR NEXT SESSION: review and progress exercises; education re-knee pain and rehab process; manual work and modalities as indicated    Jaquil Todt P Nakeita Styles, PT 01/04/2024, 1:19 PM

## 2024-01-08 ENCOUNTER — Ambulatory Visit

## 2024-01-08 DIAGNOSIS — M545 Low back pain, unspecified: Secondary | ICD-10-CM | POA: Diagnosis not present

## 2024-01-08 DIAGNOSIS — G8929 Other chronic pain: Secondary | ICD-10-CM

## 2024-01-08 DIAGNOSIS — R29898 Other symptoms and signs involving the musculoskeletal system: Secondary | ICD-10-CM | POA: Diagnosis not present

## 2024-01-08 DIAGNOSIS — M25562 Pain in left knee: Secondary | ICD-10-CM | POA: Diagnosis not present

## 2024-01-08 DIAGNOSIS — R296 Repeated falls: Secondary | ICD-10-CM | POA: Diagnosis not present

## 2024-01-08 DIAGNOSIS — M6281 Muscle weakness (generalized): Secondary | ICD-10-CM

## 2024-01-08 DIAGNOSIS — R262 Difficulty in walking, not elsewhere classified: Secondary | ICD-10-CM

## 2024-01-08 NOTE — Therapy (Signed)
 OUTPATIENT PHYSICAL THERAPY LOWER EXTREMITY TREATMENT   Patient Name: Wanda Gonzales MRN: [id number] DOB:10/10/1955, 68 y.o., female Today's Date: 01/08/2024  END OF SESSION:  PT End of Session - 01/08/24 1158     Visit Number 6    Number of Visits 20    Date for PT Re-Evaluation 03/04/24    Authorization Type [employer]; [employer]    Progress Note Due on Visit 10    PT Start Time 1158   pt arrived late   PT Stop Time 1230    PT Time Calculation (min) 32 min    Activity Tolerance Patient tolerated treatment well    Behavior During Therapy WFL for tasks assessed/performed            Past Medical History:  Diagnosis Date   Abnormal Pap smear, atypical squamous cells of undetermined sign (ASC-US ) 1998   treated with cryo, CIN I   ADD (attention deficit disorder) 02/23/2012   easily distracted.   Arthritis 02/23/2012   Rt. hip and lt. arm   Closed displaced fracture of fifth metatarsal bone of left foot 03/31/2017   Depression with anxiety    GERD (gastroesophageal reflux disease)    History of kidney stones    History of osteoporosis    PONV (postoperative nausea and vomiting) 02/23/2012   always has PONV   Post-nasal drainage 02/23/2012   frequent a problem,causes a cough and mucus buildup in throat   Urethral polyp 1998   Dr. Tommas Fragmin   Varicose veins    Venous insufficiency    chronic-denies any problems   Past Surgical History:  Procedure Laterality Date   blood clot removed Left 01/2012   blood clot removed from left hand    BREAST BIOPSY Right 02/14/2012   Fibroadenoma   COLONOSCOPY  08/2003   repeat 5 yrs.   COLONOSCOPY W/ BIOPSIES  12/2008   1 polyp recheck 5 yrs   EXCISION/RELEASE BURSA HIP  02/29/2012   HAND SURGERY  02/23/2012   "blood clot" excised palm left hand 01-18-12   KNEE ARTHROSCOPY W/ MENISCAL REPAIR  10/2012   KNEE ARTHROSCOPY WITH EXCISION BAKER'S CYST Left 10/2012   meniscal repair   left knee replacement      NASAL  SINUS SURGERY     ORIF METATARSAL FRACTURE Left 04/07/2017   Left 5th Metatarsal   scar tissue removed left knee      SHOULDER SURGERY Right 02/2015   Dr. 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   History of total knee arthroplasty, left 12/20/2023   Tremor of left hand 08/23/2023   Pelvic pressure in female 07/14/2023   Proteinuria 07/14/2023   Pelvic pain 07/14/2023   Irritant contact dermatitis 02/20/2023   IBS (irritable bowel syndrome with diarrhea) 08/17/2022   Hearing aid worn 02/10/2022   Trochanteric bursitis of left hip 02/06/2022   Frequent falls 11/26/2020   Depression with anxiety    History of osteoporosis    Age-related osteoporosis without current pathological fracture 07/29/2020   Osteoporosis 07/27/2020   Rhinitis 03/18/2020   Pain in left knee  09/07/2018   Seborrheic keratosis 11/23/2017   S/P TKR (total knee replacement), left 08/25/2016   Benign paroxysmal positional vertigo 08/25/2016   Mid back pain, chronic 12/16/2015   Cervical strain 11/21/2014   DDD (degenerative disc disease), lumbar 02/17/2014   Vitamin D  deficiency 03/18/2013   Trochanteric bursitis of right hip 02/29/2012   INSOMNIA 04/13/2010   Hyperlipidemia 09/17/2007   CERVICAL POLYP 09/17/2007   Attention deficit disorder 09/11/2007   UNSPECIFIED VENOUS INSUFFICIENCY 08/29/2007    PCP: Dr Duaine German  REFERRING PROVIDER: Thurman Flores, PA-C  REFERRING DIAG: Physical Therapy post L knee polyethylene revision   THERAPY DIAG:  Chronic pain of left knee  Difficulty in walking, not elsewhere  classified  Muscle weakness (generalized)  Other symptoms and signs involving the musculoskeletal system  Rationale for Evaluation and Treatment: Rehabilitation  ONSET DATE: ~ 3 weeks from visit 01/02/24  SUBJECTIVE:   SUBJECTIVE STATEMENT: Patient reports 3-4/10 pain in knee today; states she returns to work next Monday.    Patient reports she is woken up in the middle of the night from L thigh "tremors". Patient states she will have bandage removed on Tuesday. Patient states 8/10 pain in knee; states she is able "to get up and down okay" but has not had much of an appetite from pain.    PERTINENT HISTORY: L TKA ~ 2019; L bBaker's cyst removal; L trochanteric bursitis; pelvic pain; HA; depression; anxiety  PAIN:  Are you having pain? Yes: NPRS scale: 3-4/10 Pain location: L knee - mostly back of knee area  Pain description: throbbing; aching Aggravating factors: movement; bending and straightening knee  Relieving factors: lying down with leg stretched out; ice; pain meds   PRECAUTIONS: None  WEIGHT BEARING RESTRICTIONS: No  FALLS:  Has patient fallen in last 6 months? Yes. Number of falls 1 Fell getting out of bathtub scrap on elbow no injury to L knee   LIVING ENVIRONMENT: Lives with: lives with their spouse Lives in: House/apartment Stairs: Yes: External: 1 steps; none Has following equipment at home: Single point cane, Walker - 2 wheeled, shower chair, and Grab bars  OCCUPATION: retired Runner, broadcasting/film/video   PATIENT GOALS: get better - decrease pain; using knee for activities   NEXT MD VISIT: early May 2025  OBJECTIVE:  Note: Objective measures were completed at Evaluation unless otherwise noted.  DIAGNOSTIC FINDINGS: none available for knees   PATIENT SURVEYS:  LEFS 7/80; 8.8%     SENSATION: WFL  EDEMA:  Minimal edema L knee   MUSCLE LENGTH: Hamstrings: Right 70 deg; Left 55 deg   POSTURE: rounded shoulders, forward head, flexed trunk , and weight shift  right    LOWER EXTREMITY ROM:  Active ROM Right  eval Left  Eval  Left 01/01/24  Hip flexion     Hip extension     Hip abduction     Hip adduction     Hip internal rotation     Hip external rotation     Knee flexion 133 45 106  Knee extension +5 -6 -3  Ankle dorsiflexion     Ankle plantarflexion     Ankle inversion     Ankle eversion      (Blank rows = not tested)  LOWER EXTREMITY MMT: assessed with pt in supine  MMT Right eval Left eval  Hip flexion  3-  Hip extension  3  Hip abduction  3  Hip adduction    Hip internal rotation    Hip external  rotation    Knee flexion    Knee extension    Ankle dorsiflexion    Ankle plantarflexion    Ankle inversion    Ankle eversion     (Blank rows = not tested)  FUNCTIONAL TESTS:  Deferred due to nausea and pain   GAIT: Distance walked: 40 ft  Assistive device utilized: Environmental consultant - 2 wheeled Level of assistance: Complete Independence Comments: decreased wt bearing L LE; poor wt shift to L; decreased hip/knee flexion with toe off to heel strike L; stride length asymmetrical    [facility] Adult PT Treatment:                                                DATE: 01/08/2024 Therapeutic Exercise: NuStep L6 x 5 min Standing: Hip extension (B) 2x10 Hip abduction (B) 2x10 Mini squats x10 Supine AAROM heel slides with strap x15 Neuromuscular re-ed: Supine: Quad set + towel roll behind knee  Small range SLR 10x5" SAQ (green bolster) 10x5" Manual Therapy: Scar tissue massage (not lower third of incision)   [facility] Adult PT Treatment:                                                DATE: 01/04/2024 Therapeutic Exercise: NuStep L6 x 6 min Passive knee flexion supine heel slide with heel on pillowcase 5-10 sec hold x 10  Therapeutic activity: Step up 4 inch step L 10 x 2  Hip extension standing 10 x 2 L/R  Hip abduction standing 10 x 2 L/R Supine quad set to SLR 3 sec x 5 x 4 sets   SAQ on green bolster 3 sec x 10 x 2 L Hip  abduction in R sidelying 3 sec x 10 x 2 AAROM L knee with both heels resting on green therapy ball pt assist with stretch out strap 10 sec x 10  Sit to stand equal weight bearing use of UEs as needed to initiate movement from mat table x 10   Gait: Working on gait forward and back - patient has lateral shift of upper body to R with wt bearing L    [facility] Adult PT Treatment:                                                DATE: 01/01/2024 Therapeutic Exercise: NuStep L5 x 5 min Passive knee flexion supine heel slide with strap 5-10 sec hold x 10  Therapeutic activity: Supine quad set Supine SLR 3 sec x 10  SAQ on green bolster 3 sec x 10  L Hip abduction in R sidelying 3 sec x 10  AAROM L knee with both heels resting on green therapy ball pt assist with stretch out strap 10 sec x 10  Sit to stand equal weight bearing use of UEs as needed to initiate movement from mat table x 10   Modalities: Vaso L knee. Light compression, 34 degrees x 10 min    PATIENT EDUCATION:  Education details: POC; HEP Person educated:Patient Education method: Explanation, Demonstration, Tactile cues, Verbal cues, and Handouts Education comprehension: verbalized understanding,  returned demonstration, verbal cues required, tactile cues required, and needs further education  HOME EXERCISE PROGRAM: Access Code: [email or web address] URL: [email or web address] Date: 12/25/2023 Prepared by: Celyn Holt  Exercises - Supine Quad Set  - 2 x daily - 7 x weekly - 1 sets - 10 reps - 3 sec  hold - Small Range Straight Leg Raise  - 2 x daily - 7 x weekly - 1 sets - 10 reps - 5 sec  hold - Supine Heel Slide  - 2 x daily - 7 x weekly - 1-2 sets - 5 reps - 10 sec  hold - Seated Knee Flexion  - 2 x daily - 7 x weekly - 1 sets - 5-10 reps - 3-5 sec  hold  Access Code: [email or web address] URL: [email or web address] Date: 01/08/2024 Prepared by: Sims Duck  Patient Education - Scar  Massage  ASSESSMENT:  CLINICAL IMPRESSION: Patient arrived late to PT and treated for remainder of session. Placing towel behind knee during quad activation exercises in supine alleviated posterior knee discomfort and allowed patient to better tolerate dynamic straight leg positions. Instruction provided for scar tissue massage and added to HEP.   EVAL: Patient is a 68 y.o. female who was seen today for physical therapy evaluation and treatment following L knee polyethylene revision following failed L TKA . She has had several L knee surgeries over the past 6 years. Patient has had persistent L knee pain and limitations in L knee function. Patient presents for evaluation reporting nausea and is unable to participate fully in evaluation. Patient has limited L LE ROM, strength, function. Patient has limited transitional movements; transfers; and bed mobility as well as dependent, abnormal gait pattern. She will benefit from PT to address problems identified.    OBJECTIVE IMPAIRMENTS: Abnormal gait, decreased activity tolerance, decreased mobility, decreased ROM, decreased strength, hypomobility, increased edema, increased fascial restrictions, impaired flexibility, improper body mechanics, postural dysfunction, and pain.    GOALS: Goals reviewed with patient? Yes  SHORT TERM GOALS: Target date: 01/29/2024  Independent in initial HEP Baseline: Goal status: INITIAL  2.  Increase AAROM to AROM L knee to 0 degrees extension to 100 degrees flexion  Baseline:  Goal status: INITIAL  3.  Improved gail pattern with patient to demonstrate more equal wt bearing and wt shift L LE  Baseline:  Goal status: INITIAL   LONG TERM GOALS: Target date: 03/04/2024  Decrease pain by 50% with pain level no more than 4/10 with gait and transfers  Baseline:  Goal status: INITIAL  2.  Increase AROM L knee to 0 deg extension and 110 deg flexion  Baseline:  Goal status: INITIAL  3.  Increase strength to 4/5  to 4+/5 L LE  Baseline:  Goal status: INITIAL  4.  Independent gait with least restrictive assistive device and good gait pattern  Baseline:  Goal status: INITIAL  5.  Independent in HEP  Baseline:  Goal status: INITIAL  6.  Improve LEFS by 20 points  Baseline:  Goal status: INITIAL   PLAN:  PT FREQUENCY: 2x/week  PT DURATION: 10 weeks  PLANNED INTERVENTIONS: 97110-Therapeutic exercises, 97530- Therapeutic activity, 97112- Neuromuscular re-education, 97535- Self Care, [id number]- Manual therapy, [phone]- Aquatic Therapy, [phone]- Subsequent splinting/medication, [phone]- Ionotophoresis 4mg /ml Dexamethasone , Patient/Family education, Balance training, Stair training, Taping, Dry Needling, Joint mobilization, Cryotherapy, and Moist heat  PLAN FOR NEXT SESSION: review and progress exercises; education re-knee pain and rehab process; manual work and modalities as indicated    Flint Hummer, PTA 01/08/2024, 12:40  PM

## 2024-01-11 ENCOUNTER — Encounter: Payer: Self-pay | Admitting: Physical Therapy

## 2024-01-11 ENCOUNTER — Ambulatory Visit: Admitting: Physical Therapy

## 2024-01-11 DIAGNOSIS — G8929 Other chronic pain: Secondary | ICD-10-CM | POA: Diagnosis not present

## 2024-01-11 DIAGNOSIS — R262 Difficulty in walking, not elsewhere classified: Secondary | ICD-10-CM

## 2024-01-11 DIAGNOSIS — M545 Low back pain, unspecified: Secondary | ICD-10-CM | POA: Diagnosis not present

## 2024-01-11 DIAGNOSIS — M25562 Pain in left knee: Secondary | ICD-10-CM | POA: Diagnosis not present

## 2024-01-11 DIAGNOSIS — M6281 Muscle weakness (generalized): Secondary | ICD-10-CM

## 2024-01-11 DIAGNOSIS — R296 Repeated falls: Secondary | ICD-10-CM | POA: Diagnosis not present

## 2024-01-11 DIAGNOSIS — R29898 Other symptoms and signs involving the musculoskeletal system: Secondary | ICD-10-CM | POA: Diagnosis not present

## 2024-01-11 NOTE — Therapy (Signed)
 OUTPATIENT PHYSICAL THERAPY LOWER EXTREMITY TREATMENT   Patient Name: Wanda Gonzales MRN: [id number] DOB:1955/12/29, 68 y.o., female Today's Date: 01/11/2024  END OF SESSION:  PT End of Session - 01/11/24 1133     Visit Number 7    Number of Visits 20    Date for PT Re-Evaluation 03/04/24    Authorization Type [employer]; [employer]    Authorization - Visit Number 7    Progress Note Due on Visit 10    PT Start Time 1100    PT Stop Time 1140    PT Time Calculation (min) 40 min    Activity Tolerance Patient tolerated treatment well    Behavior During Therapy WFL for tasks assessed/performed             Past Medical History:  Diagnosis Date   Abnormal Pap smear, atypical squamous cells of undetermined sign (ASC-US ) 1998   treated with cryo, CIN I   ADD (attention deficit disorder) 02/23/2012   easily distracted.   Arthritis 02/23/2012   Rt. hip and lt. arm   Closed displaced fracture of fifth metatarsal bone of left foot 03/31/2017   Depression with anxiety    GERD (gastroesophageal reflux disease)    History of kidney stones    History of osteoporosis    PONV (postoperative nausea and vomiting) 02/23/2012   always has PONV   Post-nasal drainage 02/23/2012   frequent a problem,causes a cough and mucus buildup in throat   Urethral polyp 1998   Dr. Tommas Fragmin   Varicose veins    Venous insufficiency    chronic-denies any problems   Past Surgical History:  Procedure Laterality Date   blood clot removed Left 01/2012   blood clot removed from left hand    BREAST BIOPSY Right 02/14/2012   Fibroadenoma   COLONOSCOPY  08/2003   repeat 5 yrs.   COLONOSCOPY W/ BIOPSIES  12/2008   1 polyp recheck 5 yrs   EXCISION/RELEASE BURSA HIP  02/29/2012   HAND SURGERY  02/23/2012   "blood clot" excised palm left hand 01-18-12   KNEE ARTHROSCOPY W/ MENISCAL REPAIR  10/2012   KNEE ARTHROSCOPY WITH EXCISION BAKER'S CYST Left 10/2012   meniscal repair   left knee  replacement      NASAL SINUS SURGERY     ORIF METATARSAL FRACTURE Left 04/07/2017   Left 5th Metatarsal   scar tissue removed left knee      SHOULDER SURGERY Right 02/2015   Dr. 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   History of total knee arthroplasty, left 12/20/2023   Tremor of left hand 08/23/2023   Pelvic pressure in female 07/14/2023   Proteinuria 07/14/2023   Pelvic pain 07/14/2023   Irritant contact dermatitis 02/20/2023   IBS (irritable bowel syndrome with diarrhea) 08/17/2022   Hearing aid worn 02/10/2022   Trochanteric bursitis of left hip 02/06/2022   Frequent falls 11/26/2020   Depression with anxiety    History of osteoporosis    Age-related osteoporosis without current pathological fracture 07/29/2020   Osteoporosis 07/27/2020   Rhinitis 03/18/2020  Pain in left knee 09/07/2018   Seborrheic keratosis 11/23/2017   S/P TKR (total knee replacement), left 08/25/2016   Benign paroxysmal positional vertigo 08/25/2016   Mid back pain, chronic 12/16/2015   Cervical strain 11/21/2014   DDD (degenerative disc disease), lumbar 02/17/2014   Vitamin D  deficiency 03/18/2013   Trochanteric bursitis of right hip 02/29/2012   INSOMNIA 04/13/2010   Hyperlipidemia 09/17/2007   CERVICAL POLYP 09/17/2007   Attention deficit disorder 09/11/2007   UNSPECIFIED VENOUS INSUFFICIENCY 08/29/2007    PCP: Dr Duaine German  REFERRING PROVIDER: Thurman Flores, PA-C  REFERRING DIAG: Physical Therapy post L knee polyethylene revision   THERAPY DIAG:  Chronic pain of left knee  Difficulty in  walking, not elsewhere classified  Muscle weakness (generalized)  Rationale for Evaluation and Treatment: Rehabilitation  ONSET DATE: ~ 3 weeks from visit 01/02/24  SUBJECTIVE:   SUBJECTIVE STATEMENT: Patient reports she had bad pain last night and had to wake up twice to get ice. She states pain is only 4/10 this morning   Patient reports she is woken up in the middle of the night from L thigh "tremors". Patient states she will have bandage removed on Tuesday. Patient states 8/10 pain in knee; states she is able "to get up and down okay" but has not had much of an appetite from pain.    PERTINENT HISTORY: L TKA ~ 2019; L bBaker's cyst removal; L trochanteric bursitis; pelvic pain; HA; depression; anxiety  PAIN:  Are you having pain? Yes: NPRS scale: 4/10 Pain location: L knee - mostly back of knee area  Pain description: throbbing; aching Aggravating factors: movement; bending and straightening knee  Relieving factors: lying down with leg stretched out; ice; pain meds   PRECAUTIONS: None  WEIGHT BEARING RESTRICTIONS: No  FALLS:  Has patient fallen in last 6 months? Yes. Number of falls 1 Fell getting out of bathtub scrap on elbow no injury to L knee   LIVING ENVIRONMENT: Lives with: lives with their spouse Lives in: House/apartment Stairs: Yes: External: 1 steps; none Has following equipment at home: Single point cane, Walker - 2 wheeled, shower chair, and Grab bars  OCCUPATION: retired Runner, broadcasting/film/video   PATIENT GOALS: get better - decrease pain; using knee for activities   NEXT MD VISIT: early May 2025  OBJECTIVE:  Note: Objective measures were completed at Evaluation unless otherwise noted.  DIAGNOSTIC FINDINGS: none available for knees   PATIENT SURVEYS:  LEFS 7/80; 8.8%     SENSATION: WFL  EDEMA:  Minimal edema L knee   MUSCLE LENGTH: Hamstrings: Right 70 deg; Left 55 deg   POSTURE: rounded shoulders, forward head, flexed trunk , and weight shift  right    LOWER EXTREMITY ROM:  Active ROM Right  eval Left  Eval  Left 01/01/24  Hip flexion     Hip extension     Hip abduction     Hip adduction     Hip internal rotation     Hip external rotation     Knee flexion 133 45 106  Knee extension +5 -6 -3  Ankle dorsiflexion     Ankle plantarflexion     Ankle inversion     Ankle eversion      (Blank rows = not tested)  LOWER EXTREMITY MMT: assessed with pt in supine  MMT Right eval Left eval  Hip flexion  3-  Hip extension  3  Hip abduction  3  Hip adduction    Hip internal rotation  Hip external rotation    Knee flexion    Knee extension    Ankle dorsiflexion    Ankle plantarflexion    Ankle inversion    Ankle eversion     (Blank rows = not tested)  FUNCTIONAL TESTS:  Deferred due to nausea and pain   GAIT: Distance walked: 40 ft  Assistive device utilized: Environmental consultant - 2 wheeled Level of assistance: Complete Independence Comments: decreased wt bearing L LE; poor wt shift to L; decreased hip/knee flexion with toe off to heel strike L; stride length asymmetrical    [facility] Adult PT Treatment:                                                DATE: 01/11/24 Therapeutic Exercise: Recumbent bike x 5 mins initially partial revolutions -- progressed to full revolutions  Neuromuscular re-ed: Quad set with towel behind knee Small range SLR 10 x 5 sec hold SAQ with bolster 2 x 10 Therapeutic Activity: Standing hip abd 2 x 10 bilat Standing hip ext 2 x 10 bilat Mini squats x 10 Heel raise x 10 Heel slide with strap 10 x 5 sec hold Sit <> stand with step forward/bkwd with Rt LE. Focus on wt shift to LT and equal wt bearing with sit <> stand  Modalities: Vaso Lt knee x 10 min low pressure 34 degrees   [facility] Adult PT Treatment:                                                DATE: 01/08/2024 Therapeutic Exercise: NuStep L6 x 5 min Standing: Hip extension (B) 2x10 Hip abduction (B) 2x10 Mini squats x10 Supine AAROM  heel slides with strap x15 Neuromuscular re-ed: Supine: Quad set + towel roll behind knee  Small range SLR 10x5" SAQ (green bolster) 10x5" Manual Therapy: Scar tissue massage (not lower third of incision)   [facility] Adult PT Treatment:                                                DATE: 01/04/2024 Therapeutic Exercise: NuStep L6 x 6 min Passive knee flexion supine heel slide with heel on pillowcase 5-10 sec hold x 10  Therapeutic activity: Step up 4 inch step L 10 x 2  Hip extension standing 10 x 2 L/R  Hip abduction standing 10 x 2 L/R Supine quad set to SLR 3 sec x 5 x 4 sets   SAQ on green bolster 3 sec x 10 x 2 L Hip abduction in R sidelying 3 sec x 10 x 2 AAROM L knee with both heels resting on green therapy ball pt assist with stretch out strap 10 sec x 10  Sit to stand equal weight bearing use of UEs as needed to initiate movement from mat table x 10   Gait: Working on gait forward and back - patient has lateral shift of upper body to R with wt bearing L     PATIENT EDUCATION:  Education details: POC; HEP Person educated:Patient Education method: Explanation, Demonstration, Tactile cues, Verbal cues, and Handouts Education comprehension: verbalized  understanding, returned demonstration, verbal cues required, tactile cues required, and needs further education  HOME EXERCISE PROGRAM: Access Code: [email or web address] URL: [email or web address] Date: 12/25/2023 Prepared by: Celyn Holt  Exercises - Supine Quad Set  - 2 x daily - 7 x weekly - 1 sets - 10 reps - 3 sec  hold - Small Range Straight Leg Raise  - 2 x daily - 7 x weekly - 1 sets - 10 reps - 5 sec  hold - Supine Heel Slide  - 2 x daily - 7 x weekly - 1-2 sets - 5 reps - 10 sec  hold - Seated Knee Flexion  - 2 x daily - 7 x weekly - 1 sets - 5-10 reps - 3-5 sec  hold  Access Code: [email or web address] URL: [email or web address] Date: 01/08/2024 Prepared by: Sims Duck  Patient Education - Scar  Massage  ASSESSMENT:  CLINICAL IMPRESSION: Pt is progressing well with treatment. She has improved activity tolerance to supine and standing exercises today. Pt requires cues for equal wt bearing and for wt shift to Lt during sit to stand. Vaso today due to residual pain from last night  EVAL: Patient is a 68 y.o. female who was seen today for physical therapy evaluation and treatment following L knee polyethylene revision following failed L TKA . She has had several L knee surgeries over the past 6 years. Patient has had persistent L knee pain and limitations in L knee function. Patient presents for evaluation reporting nausea and is unable to participate fully in evaluation. Patient has limited L LE ROM, strength, function. Patient has limited transitional movements; transfers; and bed mobility as well as dependent, abnormal gait pattern. She will benefit from PT to address problems identified.    OBJECTIVE IMPAIRMENTS: Abnormal gait, decreased activity tolerance, decreased mobility, decreased ROM, decreased strength, hypomobility, increased edema, increased fascial restrictions, impaired flexibility, improper body mechanics, postural dysfunction, and pain.    GOALS: Goals reviewed with patient? Yes  SHORT TERM GOALS: Target date: 01/29/2024  Independent in initial HEP Baseline: Goal status: INITIAL  2.  Increase AAROM to AROM L knee to 0 degrees extension to 100 degrees flexion  Baseline:  Goal status: INITIAL  3.  Improved gail pattern with patient to demonstrate more equal wt bearing and wt shift L LE  Baseline:  Goal status: INITIAL   LONG TERM GOALS: Target date: 03/04/2024  Decrease pain by 50% with pain level no more than 4/10 with gait and transfers  Baseline:  Goal status: INITIAL  2.  Increase AROM L knee to 0 deg extension and 110 deg flexion  Baseline:  Goal status: INITIAL  3.  Increase strength to 4/5 to 4+/5 L LE  Baseline:  Goal status: INITIAL  4.   Independent gait with least restrictive assistive device and good gait pattern  Baseline:  Goal status: INITIAL  5.  Independent in HEP  Baseline:  Goal status: INITIAL  6.  Improve LEFS by 20 points  Baseline:  Goal status: INITIAL   PLAN:  PT FREQUENCY: 2x/week  PT DURATION: 10 weeks  PLANNED INTERVENTIONS: 97110-Therapeutic exercises, 97530- Therapeutic activity, 97112- Neuromuscular re-education, 97535- Self Care, 54098- Manual therapy, [phone]- Aquatic Therapy, [phone]- Subsequent splinting/medication, [phone]- Ionotophoresis 4mg /ml Dexamethasone , Patient/Family education, Balance training, Stair training, Taping, Dry Needling, Joint mobilization, Cryotherapy, and Moist heat  PLAN FOR NEXT SESSION: Pt is interested in aquatic therapy, review and progress exercises; education re-knee pain and rehab process; manual work and modalities as indicated    Cadell Gabrielson, PT  01/11/2024, 11:34 AM

## 2024-01-15 ENCOUNTER — Encounter

## 2024-01-17 ENCOUNTER — Ambulatory Visit

## 2024-01-17 ENCOUNTER — Encounter

## 2024-01-17 DIAGNOSIS — M6281 Muscle weakness (generalized): Secondary | ICD-10-CM | POA: Diagnosis not present

## 2024-01-17 DIAGNOSIS — G8929 Other chronic pain: Secondary | ICD-10-CM | POA: Diagnosis not present

## 2024-01-17 DIAGNOSIS — R262 Difficulty in walking, not elsewhere classified: Secondary | ICD-10-CM

## 2024-01-17 DIAGNOSIS — R296 Repeated falls: Secondary | ICD-10-CM | POA: Diagnosis not present

## 2024-01-17 DIAGNOSIS — M545 Low back pain, unspecified: Secondary | ICD-10-CM

## 2024-01-17 DIAGNOSIS — R29898 Other symptoms and signs involving the musculoskeletal system: Secondary | ICD-10-CM | POA: Diagnosis not present

## 2024-01-17 DIAGNOSIS — M25562 Pain in left knee: Secondary | ICD-10-CM | POA: Diagnosis not present

## 2024-01-17 NOTE — Therapy (Signed)
 OUTPATIENT PHYSICAL THERAPY LOWER EXTREMITY TREATMENT   Patient Name: Wanda Gonzales MRN: [id number] DOB:Aug 12, 1956, 68 y.o., female Today's Date: 01/17/2024  END OF SESSION:  PT End of Session - 01/17/24 1147     Visit Number 8    Number of Visits 20    Date for PT Re-Evaluation 03/04/24    Authorization Type [employer]; [employer]    Progress Note Due on Visit 10    PT Start Time 1147    PT Stop Time 1235    PT Time Calculation (min) 48 min    Activity Tolerance Patient tolerated treatment well    Behavior During Therapy WFL for tasks assessed/performed            Past Medical History:  Diagnosis Date   Abnormal Pap smear, atypical squamous cells of undetermined sign (ASC-US ) 1998   treated with cryo, CIN I   ADD (attention deficit disorder) 02/23/2012   easily distracted.   Arthritis 02/23/2012   Rt. hip and lt. arm   Closed displaced fracture of fifth metatarsal bone of left foot 03/31/2017   Depression with anxiety    GERD (gastroesophageal reflux disease)    History of kidney stones    History of osteoporosis    PONV (postoperative nausea and vomiting) 02/23/2012   always has PONV   Post-nasal drainage 02/23/2012   frequent a problem,causes a cough and mucus buildup in throat   Urethral polyp 1998   Dr. Tommas Fragmin   Varicose veins    Venous insufficiency    chronic-denies any problems   Past Surgical History:  Procedure Laterality Date   blood clot removed Left 01/2012   blood clot removed from left hand    BREAST BIOPSY Right 02/14/2012   Fibroadenoma   COLONOSCOPY  08/2003   repeat 5 yrs.   COLONOSCOPY W/ BIOPSIES  12/2008   1 polyp recheck 5 yrs   EXCISION/RELEASE BURSA HIP  02/29/2012   HAND SURGERY  02/23/2012   "blood clot" excised palm left hand 01-18-12   KNEE ARTHROSCOPY W/ MENISCAL REPAIR  10/2012   KNEE ARTHROSCOPY WITH EXCISION BAKER'S CYST Left 10/2012   meniscal repair   left knee replacement      NASAL SINUS SURGERY      ORIF METATARSAL FRACTURE Left 04/07/2017   Left 5th Metatarsal   scar tissue removed left knee      SHOULDER SURGERY Right 02/2015   Dr. 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   History of total knee arthroplasty, left 12/20/2023   Tremor of left hand 08/23/2023   Pelvic pressure in female 07/14/2023   Proteinuria 07/14/2023   Pelvic pain 07/14/2023   Irritant contact dermatitis 02/20/2023   IBS (irritable bowel syndrome with diarrhea) 08/17/2022   Hearing aid worn 02/10/2022   Trochanteric bursitis of left hip 02/06/2022   Frequent falls 11/26/2020   Depression with anxiety    History of osteoporosis    Age-related osteoporosis without current pathological fracture 07/29/2020   Osteoporosis 07/27/2020   Rhinitis 03/18/2020   Pain in left knee 09/07/2018   Seborrheic  keratosis 11/23/2017   S/P TKR (total knee replacement), left 08/25/2016   Benign paroxysmal positional vertigo 08/25/2016   Mid back pain, chronic 12/16/2015   Cervical strain 11/21/2014   DDD (degenerative disc disease), lumbar 02/17/2014   Vitamin D  deficiency 03/18/2013   Trochanteric bursitis of right hip 02/29/2012   INSOMNIA 04/13/2010   Hyperlipidemia 09/17/2007   CERVICAL POLYP 09/17/2007   Attention deficit disorder 09/11/2007   UNSPECIFIED VENOUS INSUFFICIENCY 08/29/2007    PCP: Dr Duaine German  REFERRING PROVIDER: Thurman Flores, PA-C  REFERRING DIAG: Physical Therapy post L knee polyethylene revision   THERAPY DIAG:  Difficulty in walking, not elsewhere classified  Muscle weakness (generalized)  Chronic  pain of left knee  Other symptoms and signs involving the musculoskeletal system  Repeated falls  Chronic bilateral low back pain without sciatica  Rationale for Evaluation and Treatment: Rehabilitation  ONSET DATE: 01/25/24  SUBJECTIVE:   SUBJECTIVE STATEMENT: Patient reports she returned to work on Monday but was only able to tolerate half a day before needing to go home; states she did not sleep well the night before and felt exhausted by the time she got home on Monday and took Tuesday off from work (only work [employer]). Patient reports 3/10 pain in knee today.  Patient reports she is woken up in the middle of the night from L thigh "tremors". Patient states she will have bandage removed on Tuesday. Patient states 8/10 pain in knee; states she is able "to get up and down okay" but has not had much of an appetite from pain.    PERTINENT HISTORY: L TKA ~ 2019; L bBaker's cyst removal; L trochanteric bursitis; pelvic pain; HA; depression; anxiety  PAIN:  Are you having pain? Yes: NPRS scale: 4/10 Pain location: L knee - mostly back of knee area  Pain description: throbbing; aching Aggravating factors: movement; bending and straightening knee  Relieving factors: lying down with leg stretched out; ice; pain meds   PRECAUTIONS: None  WEIGHT BEARING RESTRICTIONS: No  FALLS:  Has patient fallen in last 6 months? Yes. Number of falls 1 Fell getting out of bathtub scrap on elbow no injury to L knee   LIVING ENVIRONMENT: Lives with: lives with their spouse Lives in: House/apartment Stairs: Yes: External: 1 steps; none Has following equipment at home: Single point cane, Walker - 2 wheeled, shower chair, and Grab bars  OCCUPATION: retired Runner, broadcasting/film/video   PATIENT GOALS: get better - decrease pain; using knee for activities   NEXT MD VISIT: early May 2025  OBJECTIVE:  Note: Objective measures were completed at Evaluation unless otherwise noted.  DIAGNOSTIC FINDINGS: none  available for knees   PATIENT SURVEYS:  LEFS 7/80; 8.8%     SENSATION: WFL  EDEMA:  Minimal edema L knee   MUSCLE LENGTH: Hamstrings: Right 70 deg; Left 55 deg   POSTURE: rounded shoulders, forward head, flexed trunk , and weight shift right    LOWER EXTREMITY ROM:  Active ROM Right  eval Left  Eval  Left 01/01/24  Hip flexion     Hip extension     Hip abduction     Hip adduction     Hip internal rotation     Hip external rotation     Knee flexion 133 45 106  Knee extension +5 -6 -3  Ankle dorsiflexion     Ankle plantarflexion     Ankle inversion     Ankle eversion      (Blank rows = not tested)  LOWER EXTREMITY MMT: assessed with pt in supine  MMT Right eval Left eval  Hip flexion  3-  Hip extension  3  Hip abduction  3  Hip adduction    Hip internal rotation    Hip external rotation    Knee flexion    Knee extension    Ankle dorsiflexion    Ankle plantarflexion    Ankle inversion    Ankle eversion     (Blank rows = not tested)  FUNCTIONAL TESTS:  Deferred due to nausea and pain   GAIT: Distance walked: 40 ft  Assistive device utilized: Environmental consultant - 2 wheeled Level of assistance: Complete Independence Comments: decreased wt bearing L LE; poor wt shift to L; decreased hip/knee flexion with toe off to heel strike L; stride length asymmetrical    [facility] Adult PT Treatment:                                                DATE: 01/17/2024 Therapeutic Exercise: Recumbent bike L1 x 6 min --> revolutions + subjective intake Neuromuscular re-ed: Step over high yoga block --> functional knee flexion & alignment awareness Small range SLR x10, slow x8 SAQ +2#AW 2x10 Gait: Gait training with focus on functional knee flexion  Modalities: Vaso Lt knee x 10 min low pressure 34 degrees Self Care: Walking program for endurance    [facility] Adult PT Treatment:                                                DATE: 01/11/24 Therapeutic Exercise: Recumbent bike x 5  mins initially partial revolutions -- progressed to full revolutions Neuromuscular re-ed: Quad set with towel behind knee Small range SLR 10 x 5 sec hold SAQ with bolster 2 x 10 Therapeutic Activity: Standing hip abd 2 x 10 bilat Standing hip ext 2 x 10 bilat Mini squats x 10 Heel raise x 10 Heel slide with strap 10 x 5 sec hold Sit <> stand with step forward/bkwd with Rt LE. Focus on wt shift to LT and equal wt bearing with sit <> stand  Modalities: Vaso Lt knee x 10 min low pressure 34 degrees   [facility] Adult PT Treatment:                                                DATE: 01/08/2024 Therapeutic Exercise: NuStep L6 x 5 min Standing: Hip extension (B) 2x10 Hip abduction (B) 2x10 Mini squats x10 Supine AAROM heel slides with strap x15 Neuromuscular re-ed: Supine: Quad set + towel roll behind knee  Small range SLR 10x5" SAQ (green bolster) 10x5" Manual Therapy: Scar tissue massage (not lower third of incision)   PATIENT EDUCATION:  Education details: POC; HEP Person educated:Patient Education method: Explanation, Demonstration, Tactile cues, Verbal cues, and Handouts Education comprehension: verbalized understanding, returned demonstration, verbal cues required, tactile cues required, and needs further education  HOME EXERCISE PROGRAM: Access Code: [email or web address] URL: [email or web address] Date: 01/17/2024 Prepared by: Sims Duck  Exercises - Supine Quad Set  - 2 x daily - 7 x weekly - 1 sets - 10  reps - 3 sec  hold - Small Range Straight Leg Raise  - 2 x daily - 7 x weekly - 1 sets - 10 reps - 5 sec  hold - Supine Heel Slide  - 2 x daily - 7 x weekly - 1-2 sets - 5 reps - 10 sec  hold - Seated Knee Flexion  - 2 x daily - 7 x weekly - 1 sets - 5-10 reps - 3-5 sec  hold - Seated Long Arc Quad  - 1 x daily - 7 x weekly - 3 sets - 10 reps - Standing Marching  - 1 x daily - 7 x weekly - 3 sets - 10 reps - Clamshell with Resistance  - 1 x daily - 7 x weekly - 3  sets - 10 reps - Mini Squat with Counter Support  - 1 x daily - 7 x weekly - 3 sets - 10 reps  Access Code: [email or web address] URL: [email or web address] Date: 01/08/2024 Prepared by: Sims Duck  Patient Education - Scar Massage  ASSESSMENT:  CLINICAL IMPRESSION: Noted circumduction compensation with high step over block; cueing focused on slowing pace to promote alignment awareness and self-correction. Quad strengthening progressed with added light resistance. Recommended patient incorporate walking program to progress endurance and awareness with gait mechanics.   EVAL: Patient is a 68 y.o. female who was seen today for physical therapy evaluation and treatment following L knee polyethylene revision following failed L TKA . She has had several L knee surgeries over the past 6 years. Patient has had persistent L knee pain and limitations in L knee function. Patient presents for evaluation reporting nausea and is unable to participate fully in evaluation. Patient has limited L LE ROM, strength, function. Patient has limited transitional movements; transfers; and bed mobility as well as dependent, abnormal gait pattern. She will benefit from PT to address problems identified.    OBJECTIVE IMPAIRMENTS: Abnormal gait, decreased activity tolerance, decreased mobility, decreased ROM, decreased strength, hypomobility, increased edema, increased fascial restrictions, impaired flexibility, improper body mechanics, postural dysfunction, and pain.    GOALS: Goals reviewed with patient? Yes  SHORT TERM GOALS: Target date: 01/29/2024  Independent in initial HEP Baseline: Goal status: INITIAL  2.  Increase AAROM to AROM L knee to 0 degrees extension to 100 degrees flexion  Baseline:  Goal status: INITIAL  3.  Improved gail pattern with patient to demonstrate more equal wt bearing and wt shift L LE  Baseline:  Goal status: INITIAL   LONG TERM GOALS: Target date: 03/04/2024  Decrease  pain by 50% with pain level no more than 4/10 with gait and transfers  Baseline:  Goal status: INITIAL  2.  Increase AROM L knee to 0 deg extension and 110 deg flexion  Baseline:  Goal status: INITIAL  3.  Increase strength to 4/5 to 4+/5 L LE  Baseline:  Goal status: INITIAL  4.  Independent gait with least restrictive assistive device and good gait pattern  Baseline:  Goal status: INITIAL  5.  Independent in HEP  Baseline:  Goal status: INITIAL  6.  Improve LEFS by 20 points  Baseline:  Goal status: INITIAL   PLAN:  PT FREQUENCY: 2x/week  PT DURATION: 10 weeks  PLANNED INTERVENTIONS: 97110-Therapeutic exercises, 97530- Therapeutic activity, [id number]- Neuromuscular re-education, 97535- Self Care, [id number]- Manual therapy, [id number]- Aquatic Therapy, [phone]- Subsequent splinting/medication, [phone]- Ionotophoresis 4mg /ml Dexamethasone , Patient/Family education, Balance training, Stair training, Taping, Dry Needling, Joint mobilization, Cryotherapy, and Moist heat  PLAN FOR NEXT SESSION:  Review new HEP. Progress exercises; education re-knee pain and rehab process; manual work and modalities as indicated. Pt is interested in aquatic therapy   Flint Hummer, PTA 01/17/2024, 12:39 PM

## 2024-01-18 DIAGNOSIS — K080 Exfoliation of teeth due to systemic causes: Secondary | ICD-10-CM | POA: Diagnosis not present

## 2024-01-19 ENCOUNTER — Ambulatory Visit: Attending: Physician Assistant

## 2024-01-19 DIAGNOSIS — M25562 Pain in left knee: Secondary | ICD-10-CM | POA: Insufficient documentation

## 2024-01-19 DIAGNOSIS — R262 Difficulty in walking, not elsewhere classified: Secondary | ICD-10-CM | POA: Diagnosis not present

## 2024-01-19 DIAGNOSIS — M6281 Muscle weakness (generalized): Secondary | ICD-10-CM | POA: Diagnosis not present

## 2024-01-19 DIAGNOSIS — R29898 Other symptoms and signs involving the musculoskeletal system: Secondary | ICD-10-CM | POA: Insufficient documentation

## 2024-01-19 DIAGNOSIS — G8929 Other chronic pain: Secondary | ICD-10-CM | POA: Diagnosis not present

## 2024-01-19 DIAGNOSIS — R296 Repeated falls: Secondary | ICD-10-CM | POA: Diagnosis not present

## 2024-01-19 NOTE — Therapy (Signed)
 OUTPATIENT PHYSICAL THERAPY LOWER EXTREMITY TREATMENT   Patient Name: Wanda Gonzales MRN: [id number] DOB:July 29, 1956, 68 y.o., female Today's Date: 01/19/2024  END OF SESSION:  PT End of Session - 01/19/24 1109     Visit Number 9    Number of Visits 20    Date for PT Re-Evaluation 03/04/24    Authorization Type [employer]; [employer]    Progress Note Due on Visit 10    PT Start Time 1104    PT Stop Time 1149    PT Time Calculation (min) 45 min    Activity Tolerance Patient tolerated treatment well    Behavior During Therapy WFL for tasks assessed/performed            Past Medical History:  Diagnosis Date   Abnormal Pap smear, atypical squamous cells of undetermined sign (ASC-US ) 1998   treated with cryo, CIN I   ADD (attention deficit disorder) 02/23/2012   easily distracted.   Arthritis 02/23/2012   Rt. hip and lt. arm   Closed displaced fracture of fifth metatarsal bone of left foot 03/31/2017   Depression with anxiety    GERD (gastroesophageal reflux disease)    History of kidney stones    History of osteoporosis    PONV (postoperative nausea and vomiting) 02/23/2012   always has PONV   Post-nasal drainage 02/23/2012   frequent a problem,causes a cough and mucus buildup in throat   Urethral polyp 1998   Dr. Tommas Fragmin   Varicose veins    Venous insufficiency    chronic-denies any problems   Past Surgical History:  Procedure Laterality Date   blood clot removed Left 01/2012   blood clot removed from left hand    BREAST BIOPSY Right 02/14/2012   Fibroadenoma   COLONOSCOPY  08/2003   repeat 5 yrs.   COLONOSCOPY W/ BIOPSIES  12/2008   1 polyp recheck 5 yrs   EXCISION/RELEASE BURSA HIP  02/29/2012   HAND SURGERY  02/23/2012   "blood clot" excised palm left hand 01-18-12   KNEE ARTHROSCOPY W/ MENISCAL REPAIR  10/2012   KNEE ARTHROSCOPY WITH EXCISION BAKER'S CYST Left 10/2012   meniscal repair   left knee replacement      NASAL SINUS SURGERY      ORIF METATARSAL FRACTURE Left 04/07/2017   Left 5th Metatarsal   scar tissue removed left knee      SHOULDER SURGERY Right 02/2015   Dr. 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   History of total knee arthroplasty, left 12/20/2023   Tremor of left hand 08/23/2023   Pelvic pressure in female 07/14/2023   Proteinuria 07/14/2023   Pelvic pain 07/14/2023   Irritant contact dermatitis 02/20/2023   IBS (irritable bowel syndrome with diarrhea) 08/17/2022   Hearing aid worn 02/10/2022   Trochanteric bursitis of left hip 02/06/2022   Frequent falls 11/26/2020   Depression with anxiety    History of osteoporosis    Age-related osteoporosis without current pathological fracture 07/29/2020   Osteoporosis 07/27/2020   Rhinitis 03/18/2020   Pain in left knee 09/07/2018   Seborrheic  keratosis 11/23/2017   S/P TKR (total knee replacement), left 08/25/2016   Benign paroxysmal positional vertigo 08/25/2016   Mid back pain, chronic 12/16/2015   Cervical strain 11/21/2014   DDD (degenerative disc disease), lumbar 02/17/2014   Vitamin D  deficiency 03/18/2013   Trochanteric bursitis of right hip 02/29/2012   INSOMNIA 04/13/2010   Hyperlipidemia 09/17/2007   CERVICAL POLYP 09/17/2007   Attention deficit disorder 09/11/2007   UNSPECIFIED VENOUS INSUFFICIENCY 08/29/2007    PCP: Dr Duaine German  REFERRING PROVIDER: Thurman Flores, PA-C  REFERRING DIAG: Physical Therapy post L knee polyethylene revision   THERAPY DIAG:  Difficulty in walking, not elsewhere classified  Muscle weakness (generalized)  Chronic  pain of left knee  Other symptoms and signs involving the musculoskeletal system  Rationale for Evaluation and Treatment: Rehabilitation  ONSET DATE: 01/25/24  SUBJECTIVE:   SUBJECTIVE STATEMENT: Patient reports she has pain and soreness behind knee; states she did all her exercises and then went grocery shopping and needed ice on knee afterwards. Patient states she is taking her dog to the dog park today.   Patient reports she is woken up in the middle of the night from L thigh "tremors". Patient states she will have bandage removed on Tuesday. Patient states 8/10 pain in knee; states she is able "to get up and down okay" but has not had much of an appetite from pain.    PERTINENT HISTORY: L TKA ~ 2019; L bBaker's cyst removal; L trochanteric bursitis; pelvic pain; HA; depression; anxiety  PAIN:  Are you having pain? Yes: NPRS scale: 4/10 Pain location: L knee - mostly back of knee area  Pain description: throbbing; aching Aggravating factors: movement; bending and straightening knee  Relieving factors: lying down with leg stretched out; ice; pain meds   PRECAUTIONS: None  WEIGHT BEARING RESTRICTIONS: No  FALLS:  Has patient fallen in last 6 months? Yes. Number of falls 1 Fell getting out of bathtub scrap on elbow no injury to L knee   LIVING ENVIRONMENT: Lives with: lives with their spouse Lives in: House/apartment Stairs: Yes: External: 1 steps; none Has following equipment at home: Single point cane, Walker - 2 wheeled, shower chair, and Grab bars  OCCUPATION: retired Runner, broadcasting/film/video   PATIENT GOALS: get better - decrease pain; using knee for activities   NEXT MD VISIT: 01/25/24  OBJECTIVE:  Note: Objective measures were completed at Evaluation unless otherwise noted.  DIAGNOSTIC FINDINGS: none available for knees   PATIENT SURVEYS:  LEFS 7/80; 8.8%     SENSATION: WFL  EDEMA:  Minimal edema L knee   MUSCLE LENGTH: Hamstrings: Right 70 deg; Left 55 deg   POSTURE:  rounded shoulders, forward head, flexed trunk , and weight shift right    LOWER EXTREMITY ROM:  Active ROM Right  eval Left  Eval  Left 01/01/24  Hip flexion     Hip extension     Hip abduction     Hip adduction     Hip internal rotation     Hip external rotation     Knee flexion 133 45 106  Knee extension +5 -6 -3  Ankle dorsiflexion     Ankle plantarflexion     Ankle inversion     Ankle eversion      (Blank rows = not tested)  LOWER EXTREMITY MMT: assessed with pt in supine  MMT Right eval Left eval  Hip flexion  3-  Hip extension  3  Hip abduction  3  Hip  adduction    Hip internal rotation    Hip external rotation    Knee flexion    Knee extension    Ankle dorsiflexion    Ankle plantarflexion    Ankle inversion    Ankle eversion     (Blank rows = not tested)  FUNCTIONAL TESTS:  Deferred due to nausea and pain   GAIT: Distance walked: 40 ft  Assistive device utilized: Environmental consultant - 2 wheeled Level of assistance: Complete Independence Comments: decreased wt bearing L LE; poor wt shift to L; decreased hip/knee flexion with toe off to heel strike L; stride length asymmetrical    [facility] Adult PT Treatment:                                                DATE: 01/19/2024 Therapeutic Exercise: Recumbent bike L1 x 5 min Standing: Hip abd + YTB 2x10 Hip extension + YTB 2x10  Mini squats x10 Supine HS stretch w/strap Seated HS stretch w/strap --> foot propped on black stepper Knee flexion PROM Standing L knee flexion Neuromuscular re-ed: Side Lying: Clamshells --> discontinued d/t pain in lower anterior knee Straight leg hip abd 2x10 Bridges + RTB for hip abd isometric 2x10 Therapeutic Activity: Bed mobility --> bent knee log rolling Self Care: Ice at home  [facility] Adult PT Treatment:                                                DATE: 01/17/2024 Therapeutic Exercise: Recumbent bike L1 x 6 min --> revolutions + subjective intake Neuromuscular re-ed: Step over  high yoga block --> functional knee flexion & alignment awareness Small range SLR x10, slow x8 SAQ +2#AW 2x10 Gait: Gait training with focus on functional knee flexion  Modalities: Vaso Lt knee x 10 min low pressure 34 degrees Self Care: Walking program for endurance    [facility] Adult PT Treatment:                                                DATE: 01/11/24 Therapeutic Exercise: Recumbent bike x 5 mins initially partial revolutions -- progressed to full revolutions Neuromuscular re-ed: Quad set with towel behind knee Small range SLR 10 x 5 sec hold SAQ with bolster 2 x 10 Therapeutic Activity: Standing hip abd 2 x 10 bilat Standing hip ext 2 x 10 bilat Mini squats x 10 Heel raise x 10 Heel slide with strap 10 x 5 sec hold Sit <> stand with step forward/bkwd with Rt LE. Focus on wt shift to LT and equal wt bearing with sit <> stand Modalities: Vaso Lt knee x 10 min low pressure 34 degrees   PATIENT EDUCATION:  Education details: Updated HEP Person educated:Patient Education method: Explanation, Demonstration, Tactile cues, Verbal cues, and Handouts Education comprehension: verbalized understanding, returned demonstration, verbal cues required, tactile cues required, and needs further education  HOME EXERCISE PROGRAM: Access Code: [email or web address] URL: [email or web address] Date: 01/19/2024 Prepared by: Sims Duck  Exercises - Supine Quad Set  - 2 x daily - 7 x weekly - 1 sets - 10 reps - 3 sec  hold - Small Range Straight Leg Raise  - 2 x daily - 7 x weekly - 1 sets - 10 reps - 5 sec  hold - Supine Heel Slide  - 2 x daily - 7 x weekly - 1-2 sets - 5 reps - 10 sec  hold - Seated Knee Flexion  - 2 x daily - 7 x weekly - 1 sets - 5-10 reps - 3-5 sec  hold - Seated Long Arc Quad  - 1 x daily - 7 x weekly - 3 sets - 10 reps - Standing Marching  - 1 x daily - 7 x weekly - 3 sets - 10 reps - Mini Squat with Counter Support  - 1 x daily - 7 x weekly - 3 sets - 10 reps -  Sidelying Hip Abduction with Resistance at Thighs  - 1 x daily - 7 x weekly - 3 sets - 10 reps   ASSESSMENT:  CLINICAL IMPRESSION: Posterior knee pain/discomfort decreased after recumbent bike warm-up. Increased pain at lower anterior knee during side lying clamshells; pain alleviated with straight leg hip abduction. Patient instructed in proper body mechanics and maintaining LE alignment during sit to supine transfers to alleviate increased knee pain and decrease twisting at knee. LE strengthening progressed as tolerated, modifying as appropriate due to anterior knee pain.   EVAL: Patient is a 68 y.o. female who was seen today for physical therapy evaluation and treatment following L knee polyethylene revision following failed L TKA . She has had several L knee surgeries over the past 6 years. Patient has had persistent L knee pain and limitations in L knee function. Patient presents for evaluation reporting nausea and is unable to participate fully in evaluation. Patient has limited L LE ROM, strength, function. Patient has limited transitional movements; transfers; and bed mobility as well as dependent, abnormal gait pattern. She will benefit from PT to address problems identified.    OBJECTIVE IMPAIRMENTS: Abnormal gait, decreased activity tolerance, decreased mobility, decreased ROM, decreased strength, hypomobility, increased edema, increased fascial restrictions, impaired flexibility, improper body mechanics, postural dysfunction, and pain.    GOALS: Goals reviewed with patient? Yes  SHORT TERM GOALS: Target date: 01/29/2024  Independent in initial HEP Baseline: Goal status: INITIAL  2.  Increase AAROM to AROM L knee to 0 degrees extension to 100 degrees flexion  Baseline:  Goal status: INITIAL  3.  Improved gail pattern with patient to demonstrate more equal wt bearing and wt shift L LE  Baseline:  Goal status: INITIAL   LONG TERM GOALS: Target date: 03/04/2024  Decrease pain  by 50% with pain level no more than 4/10 with gait and transfers  Baseline:  Goal status: INITIAL  2.  Increase AROM L knee to 0 deg extension and 110 deg flexion  Baseline:  Goal status: INITIAL  3.  Increase strength to 4/5 to 4+/5 L LE  Baseline:  Goal status: INITIAL  4.  Independent gait with least restrictive assistive device and good gait pattern  Baseline:  Goal status: INITIAL  5.  Independent in HEP  Baseline:  Goal status: INITIAL  6.  Improve LEFS by 20 points  Baseline:  Goal status: INITIAL   PLAN:  PT FREQUENCY: 2x/week  PT DURATION: 10 weeks  PLANNED INTERVENTIONS: 97110-Therapeutic exercises, 97530- Therapeutic activity, 97112- Neuromuscular re-education, 97535- Self Care, 95638- Manual therapy, [id number]- Aquatic Therapy, [phone]- Subsequent splinting/medication, [phone]- Ionotophoresis 4mg /ml Dexamethasone , Patient/Family education, Balance training, Stair training, Taping, Dry Needling, Joint mobilization, Cryotherapy, and Moist  heat  PLAN FOR NEXT SESSION: Progress exercises; education re-knee pain and rehab process; manual work and modalities as indicated. Pt is interested in aquatic therapy (01/19/24 - recommended patient review aquatic HEP from previous sessions)   Flint Hummer, PTA 01/19/2024, 11:57 AM

## 2024-01-23 ENCOUNTER — Ambulatory Visit: Admitting: Physical Therapy

## 2024-01-23 ENCOUNTER — Encounter: Payer: Self-pay | Admitting: Physical Therapy

## 2024-01-23 DIAGNOSIS — R29898 Other symptoms and signs involving the musculoskeletal system: Secondary | ICD-10-CM | POA: Diagnosis not present

## 2024-01-23 DIAGNOSIS — G8929 Other chronic pain: Secondary | ICD-10-CM | POA: Diagnosis not present

## 2024-01-23 DIAGNOSIS — M6281 Muscle weakness (generalized): Secondary | ICD-10-CM | POA: Diagnosis not present

## 2024-01-23 DIAGNOSIS — M25562 Pain in left knee: Secondary | ICD-10-CM | POA: Diagnosis not present

## 2024-01-23 DIAGNOSIS — R262 Difficulty in walking, not elsewhere classified: Secondary | ICD-10-CM | POA: Diagnosis not present

## 2024-01-23 DIAGNOSIS — R296 Repeated falls: Secondary | ICD-10-CM | POA: Diagnosis not present

## 2024-01-23 NOTE — Therapy (Signed)
 OUTPATIENT PHYSICAL THERAPY LOWER EXTREMITY TREATMENT AND MD/PROGRESS NOTE  Reporting Period 12/25/23 to 01/23/24   See note below for Objective Data and Assessment of Progress/Goals.    Patient Name: Wanda Gonzales MRN: [id number] DOB:[birth year], 68 y.o., female Today's Date: 01/23/2024  END OF SESSION:  PT End of Session - 01/23/24 1541     Visit Number 10    Number of Visits 20    Date for PT Re-Evaluation 03/04/24    Authorization Type [employer]; [employer]    Authorization - Visit Number 8    Progress Note Due on Visit 10    PT Start Time 1541    PT Stop Time 1621    PT Time Calculation (min) 40 min             Past Medical History:  Diagnosis Date   Abnormal Pap smear, atypical squamous cells of undetermined sign (ASC-US ) 1998   treated with cryo, CIN I   ADD (attention deficit disorder) 02/23/2012   easily distracted.   Arthritis 02/23/2012   Rt. hip and lt. arm   Closed displaced fracture of fifth metatarsal bone of left foot 03/31/2017   Depression with anxiety    GERD (gastroesophageal reflux disease)    History of kidney stones    History of osteoporosis    PONV (postoperative nausea and vomiting) 02/23/2012   always has PONV   Post-nasal drainage 02/23/2012   frequent a problem,causes a cough and mucus buildup in throat   Urethral polyp 1998   Dr. Tommas Fragmin   Varicose veins    Venous insufficiency    chronic-denies any problems   Past Surgical History:  Procedure Laterality Date   blood clot removed Left 01/2012   blood clot removed from left hand    BREAST BIOPSY Right 02/14/2012   Fibroadenoma   COLONOSCOPY  08/2003   repeat 5 yrs.   COLONOSCOPY W/ BIOPSIES  12/2008   1 polyp recheck 5 yrs   EXCISION/RELEASE BURSA HIP  02/29/2012   HAND SURGERY  02/23/2012   "blood clot" excised palm left hand 01-18-12   KNEE ARTHROSCOPY W/ MENISCAL REPAIR  10/2012   KNEE ARTHROSCOPY WITH EXCISION BAKER'S CYST Left 10/2012   meniscal repair    left knee replacement      NASAL SINUS SURGERY     ORIF METATARSAL FRACTURE Left 04/07/2017   Left 5th Metatarsal   scar tissue removed left knee      SHOULDER SURGERY Right 02/2015   Dr. 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   History of total knee arthroplasty, left 12/20/2023   Tremor of left hand 08/23/2023   Pelvic pressure in female 07/14/2023   Proteinuria 07/14/2023   Pelvic pain 07/14/2023   Irritant contact dermatitis 02/20/2023   IBS (irritable bowel syndrome with diarrhea) 08/17/2022   Hearing aid worn 02/10/2022   Trochanteric bursitis of left hip 02/06/2022   Frequent falls 11/26/2020   Depression with anxiety    History of osteoporosis    Age-related osteoporosis without current pathological fracture 07/29/2020   Osteoporosis 07/27/2020  Rhinitis 03/18/2020   Pain in left knee 09/07/2018   Seborrheic keratosis 11/23/2017   S/P TKR (total knee replacement), left 08/25/2016   Benign paroxysmal positional vertigo 08/25/2016   Mid back pain, chronic 12/16/2015   Cervical strain 11/21/2014   DDD (degenerative disc disease), lumbar 02/17/2014   Vitamin D  deficiency 03/18/2013   Trochanteric bursitis of right hip 02/29/2012   INSOMNIA 04/13/2010   Hyperlipidemia 09/17/2007   CERVICAL POLYP 09/17/2007   Attention deficit disorder 09/11/2007   UNSPECIFIED VENOUS INSUFFICIENCY 08/29/2007    PCP: Dr Duaine German  REFERRING PROVIDER: Thurman Flores, PA-C  REFERRING DIAG: Physical Therapy post L knee polyethylene revision   THERAPY DIAG:  Difficulty in walking, not elsewhere  classified  Muscle weakness (generalized)  Chronic pain of left knee  Other symptoms and signs involving the musculoskeletal system  Rationale for Evaluation and Treatment: Rehabilitation  ONSET DATE: 01/25/24  SUBJECTIVE:   SUBJECTIVE STATEMENT: Pain is in the knee cap. I worked all day today and I'm whipped.  Patient reports she is woken up in the middle of the night from L thigh "tremors". Patient states she will have bandage removed on Tuesday. Patient states 8/10 pain in knee; states she is able "to get up and down okay" but has not had much of an appetite from pain.    PERTINENT HISTORY: L TKA ~ 2019; L bBaker's cyst removal; L trochanteric bursitis; pelvic pain; HA; depression; anxiety  PAIN:  Are you having pain? Yes: NPRS scale: 5/10 Pain location: L knee - mostly back of knee area  Pain description: throbbing; aching Aggravating factors: movement; bending and straightening knee  Relieving factors: lying down with leg stretched out; ice; pain meds   PRECAUTIONS: None  WEIGHT BEARING RESTRICTIONS: No  FALLS:  Has patient fallen in last 6 months? Yes. Number of falls 1 Fell getting out of bathtub scrap on elbow no injury to L knee   LIVING ENVIRONMENT: Lives with: lives with their spouse Lives in: House/apartment Stairs: Yes: External: 1 steps; none Has following equipment at home: Single point cane, Walker - 2 wheeled, shower chair, and Grab bars  OCCUPATION: retired Runner, broadcasting/film/video   PATIENT GOALS: get better - decrease pain; using knee for activities   NEXT MD VISIT: 01/25/24  OBJECTIVE:  Note: Objective measures were completed at Evaluation unless otherwise noted.  DIAGNOSTIC FINDINGS: none available for knees   PATIENT SURVEYS:  LEFS 7/80; 8.8%     SENSATION: WFL  EDEMA:  Minimal edema L knee   MUSCLE LENGTH: Hamstrings: Right 70 deg; Left 55 deg   POSTURE: rounded shoulders, forward head, flexed trunk , and weight shift right    LOWER EXTREMITY  ROM:  Active ROM Right  eval Left  Eval  Left 01/01/24 Left  5/6  Hip flexion      Hip extension      Hip abduction      Hip adduction      Hip internal rotation      Hip external rotation      Knee flexion 133 45 106 118  Knee extension +5 -6 -3 -4  Ankle dorsiflexion      Ankle plantarflexion      Ankle inversion      Ankle eversion       (Blank rows = not tested)  LOWER EXTREMITY MMT: assessed with pt in supine  MMT Right eval Left eval Left   Hip flexion  3- 3+ pain in knee  Hip extension  3 5  Hip abduction  3 4+  Hip adduction     Hip internal rotation     Hip external rotation     Knee flexion   5  Knee extension   5  Ankle dorsiflexion     Ankle plantarflexion     Ankle inversion     Ankle eversion      (Blank rows = not tested)  FUNCTIONAL TESTS:  Deferred due to nausea and pain   GAIT: Distance walked: 40 ft  Assistive device utilized: Environmental consultant - 2 wheeled Level of assistance: Complete Independence Comments: decreased wt bearing L LE; poor wt shift to L; decreased hip/knee flexion with toe off to heel strike L; stride length asymmetrical    [facility] Adult PT Treatment:                                                DATE: 01/19/2024 Therapeutic Exercise: Standing: Wall mini squats 2x 10 Sit to stand x 5 - poor eccentric control on descent Standing L knee flexion x 10 intermittent pain Neuromuscular re-ed: Quad set with towel behind knee Small range SLR 10 x 5 sec hold Side Lying: Clamshells --> discontinued d/t pain in lower anterior knee Straight leg hip abd 2x10 Bridges + RTB for hip abd isometric 2x10 Manual: IASTM to L quads and around distal patella MMT/ROM checked  [facility] Adult PT Treatment:                                                DATE: 01/17/2024 Therapeutic Exercise: Recumbent bike L1 x 6 min --> revolutions + subjective intake Neuromuscular re-ed: Step over high yoga block --> functional knee flexion & alignment awareness Small  range SLR x10, slow x8 SAQ +2#AW 2x10 Gait: Gait training with focus on functional knee flexion  Modalities: Vaso Lt knee x 10 min low pressure 34 degrees Self Care: Walking program for endurance    [facility] Adult PT Treatment:                                                DATE: 01/11/24 Therapeutic Exercise: Recumbent bike x 5 mins initially partial revolutions -- progressed to full revolutions Neuromuscular re-ed: Quad set with towel behind knee Small range SLR 10 x 5 sec hold SAQ with bolster 2 x 10 Therapeutic Activity: Standing hip abd 2 x 10 bilat Standing hip ext 2 x 10 bilat Mini squats x 10 Heel raise x 10 Heel slide with strap 10 x 5 sec hold Sit <> stand with step forward/bkwd with Rt LE. Focus on wt shift to LT and equal wt bearing with sit <> stand Modalities: Vaso Lt knee x 10 min low pressure 34 degrees   PATIENT EDUCATION:  Education details: Updated HEP Person educated:Patient Education method: Explanation, Demonstration, Tactile cues, Verbal cues, and Handouts Education comprehension: verbalized understanding, returned demonstration, verbal cues required, tactile cues required, and needs further education  HOME EXERCISE PROGRAM: Access Code: [email or web address] URL: [email or web address] Date: 01/19/2024 Prepared by: Sims Duck  Exercises - Supine Quad Set  - 2 x daily -  7 x weekly - 1 sets - 10 reps - 3 sec  hold - Small Range Straight Leg Raise  - 2 x daily - 7 x weekly - 1 sets - 10 reps - 5 sec  hold - Supine Heel Slide  - 2 x daily - 7 x weekly - 1-2 sets - 5 reps - 10 sec  hold - Seated Knee Flexion  - 2 x daily - 7 x weekly - 1 sets - 5-10 reps - 3-5 sec  hold - Seated Long Arc Quad  - 1 x daily - 7 x weekly - 3 sets - 10 reps - Standing Marching  - 1 x daily - 7 x weekly - 3 sets - 10 reps - Mini Squat with Counter Support  - 1 x daily - 7 x weekly - 3 sets - 10 reps - Sidelying Hip Abduction with Resistance at Thighs  - 1 x daily - 7 x  weekly - 3 sets - 10 reps   ASSESSMENT:  CLINICAL IMPRESSION: Elliet Falla Pinder presents reporting fatigue today after being on her feet all day. Her main complaint is that she cannot sleep well. She has marked pain in lateral knee cap and just distal both with palpation and with prone knee flexion. She also experiences in S/L with clams. Her strength with MMT has improved to WNL except for L hip flexion which is limited by knee pain. Functionally she has weakness with squatting and stand to sit transfers. Her gait has improved significantly and she no longer uses AD. She has met her knee flexion goal, but still lacks full extension. She continues to demonstrate potential for improvement and would benefit from continued skilled therapy to address impairments.      EVAL: Patient is a 68 y.o. female who was seen today for physical therapy evaluation and treatment following L knee polyethylene revision following failed L TKA . She has had several L knee surgeries over the past 6 years. Patient has had persistent L knee pain and limitations in L knee function. Patient presents for evaluation reporting nausea and is unable to participate fully in evaluation. Patient has limited L LE ROM, strength, function. Patient has limited transitional movements; transfers; and bed mobility as well as dependent, abnormal gait pattern. She will benefit from PT to address problems identified.    OBJECTIVE IMPAIRMENTS: Abnormal gait, decreased activity tolerance, decreased mobility, decreased ROM, decreased strength, hypomobility, increased edema, increased fascial restrictions, impaired flexibility, improper body mechanics, postural dysfunction, and pain.    GOALS: Goals reviewed with patient? Yes  SHORT TERM GOALS: Target date: 01/29/2024  Independent in initial HEP Baseline: Goal status: MET 5/6  2.  Increase AAROM to AROM L knee to 0 degrees extension to 100 degrees flexion  Baseline:  Goal status: PARTIALLY MET  - flex 01/23/24  3.  Improved gail pattern with patient to demonstrate more equal wt bearing and wt shift L LE  Baseline:  Goal status: MET 5/6   LONG TERM GOALS: Target date: 03/04/2024  Decrease pain by 50% with pain level no more than 4/10 with gait and transfers  Baseline:  Goal status: INITIAL  2.  Increase AROM L knee to 0 deg extension and 110 deg flexion  Baseline:  Goal status: PARTIALLY MET for flexion 01/23/24  3.  Increase strength to 4/5 to 4+/5 L LE  Baseline:  Goal status: PARTIALLY MET except hip flexion 01/23/24  4.  Independent gait with least restrictive assistive device and good  gait pattern  Baseline:  Goal status: INITIAL  5.  Independent in HEP  Baseline:  Goal status: INITIAL  6.  Improve LEFS by 20 points  Baseline:  Goal status: INITIAL   PLAN:  PT FREQUENCY: 2x/week  PT DURATION: 10 weeks  PLANNED INTERVENTIONS: 97110-Therapeutic exercises, 97530- Therapeutic activity, [id number]- Neuromuscular re-education, 97535- Self Care, [id number]- Manual therapy, [id number]- Aquatic Therapy, [phone]- Subsequent splinting/medication, [phone]- Ionotophoresis 4mg /ml Dexamethasone , Patient/Family education, Balance training, Stair training, Taping, Dry Needling, Joint mobilization, Cryotherapy, and Moist heat  PLAN FOR NEXT SESSION: work on eccentric quad strength and hip flexion strength. Progress exercises; education re-knee pain and rehab process; manual work and modalities as indicated. Pt is interested in aquatic therapy (01/19/24 - recommended patient review aquatic HEP from previous sessions)   Jinx Mourning, PT  01/23/2024, 4:37 PM

## 2024-01-25 ENCOUNTER — Ambulatory Visit: Admitting: Rehabilitative and Restorative Service Providers"

## 2024-01-25 ENCOUNTER — Encounter: Payer: Self-pay | Admitting: Rehabilitative and Restorative Service Providers"

## 2024-01-25 DIAGNOSIS — Z96652 Presence of left artificial knee joint: Secondary | ICD-10-CM | POA: Diagnosis not present

## 2024-01-25 DIAGNOSIS — R262 Difficulty in walking, not elsewhere classified: Secondary | ICD-10-CM

## 2024-01-25 DIAGNOSIS — M6281 Muscle weakness (generalized): Secondary | ICD-10-CM

## 2024-01-25 DIAGNOSIS — R296 Repeated falls: Secondary | ICD-10-CM | POA: Diagnosis not present

## 2024-01-25 DIAGNOSIS — M25562 Pain in left knee: Secondary | ICD-10-CM | POA: Diagnosis not present

## 2024-01-25 DIAGNOSIS — R29898 Other symptoms and signs involving the musculoskeletal system: Secondary | ICD-10-CM

## 2024-01-25 DIAGNOSIS — G8929 Other chronic pain: Secondary | ICD-10-CM | POA: Diagnosis not present

## 2024-01-25 DIAGNOSIS — M222X1 Patellofemoral disorders, right knee: Secondary | ICD-10-CM | POA: Diagnosis not present

## 2024-01-25 NOTE — Therapy (Addendum)
 OUTPATIENT PHYSICAL THERAPY LOWER EXTREMITY TREATMENT AND MD/PROGRESS NOTE  Reporting Period 12/25/23 to 01/23/24   See note below for Objective Data and Assessment of Progress/Goals.            PHYSICAL THERAPY DISCHARGE SUMMARY  Visits from Start of Care: 11  Current functional level related to goals / functional outcomes: See progress note for discharge status    Remaining deficits: Continued pain intermittently; weakness    Education / Equipment: HEP    Patient agrees to discharge. Patient goals were partially met. Patient is being discharged due to being pleased with the current functional level.   Wanda Gonzales P. Ina PT, MPH 03/18/24 7:10 AM     Patient Name: Wanda Gonzales MRN: [id number] DOB:1956/03/24, 68 y.o., female Today's Date: 01/25/2024  END OF SESSION:  PT End of Session - 01/25/24 1409     Visit Number 11    Number of Visits 20    Date for PT Re-Evaluation 03/04/24    Authorization Type blue [employer]; [employer]    Authorization - Visit Number 10    Progress Note Due on Visit 20    PT Start Time 1404    PT Stop Time 1445    PT Time Calculation (min) 41 min             Past Medical History:  Diagnosis Date   Abnormal Pap smear, atypical squamous cells of undetermined sign (ASC-US ) 1998   treated with cryo, CIN I   ADD (attention deficit disorder) 02/23/2012   easily distracted.   Arthritis 02/23/2012   Rt. hip and lt. arm   Closed displaced fracture of fifth metatarsal bone of left foot 03/31/2017   Depression with anxiety    GERD (gastroesophageal reflux disease)    History of kidney stones    History of osteoporosis    PONV (postoperative nausea and vomiting) 02/23/2012   always has PONV   Post-nasal drainage 02/23/2012   frequent a problem,causes a cough and mucus buildup in throat   Urethral polyp 1998   Dr. Duncan   Varicose veins    Venous insufficiency    chronic-denies any problems   Past Surgical History:  Procedure  Laterality Date   blood clot removed Left 01/2012   blood clot removed from left hand    BREAST BIOPSY Right 02/14/2012   Fibroadenoma   COLONOSCOPY  08/2003   repeat 5 yrs.   COLONOSCOPY W/ BIOPSIES  12/2008   1 polyp recheck 5 yrs   EXCISION/RELEASE BURSA HIP  02/29/2012   HAND SURGERY  02/23/2012   blood clot excised palm left hand 01-18-12   KNEE ARTHROSCOPY W/ MENISCAL REPAIR  10/2012   KNEE ARTHROSCOPY WITH EXCISION BAKER'S CYST Left 10/2012   meniscal repair   left knee replacement      NASAL SINUS SURGERY     ORIF METATARSAL FRACTURE Left 04/07/2017   Left 5th Metatarsal   scar tissue removed left knee      SHOULDER SURGERY Right 02/2015   Dr. 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   History  of total knee arthroplasty, left 12/20/2023   Tremor of left hand 08/23/2023   Pelvic pressure in female 07/14/2023   Proteinuria 07/14/2023   Pelvic pain 07/14/2023   Irritant contact dermatitis 02/20/2023   IBS (irritable bowel syndrome with diarrhea) 08/17/2022   Hearing aid worn 02/10/2022   Trochanteric bursitis of left hip 02/06/2022   Frequent falls 11/26/2020   Depression with anxiety    History of osteoporosis    Age-related osteoporosis without current pathological fracture 07/29/2020   Osteoporosis 07/27/2020   Rhinitis 03/18/2020   Pain in left knee 09/07/2018   Seborrheic keratosis 11/23/2017   S/P TKR (total knee replacement), left 08/25/2016   Benign paroxysmal positional vertigo 08/25/2016   Mid back pain, chronic 12/16/2015   Cervical strain 11/21/2014   DDD (degenerative disc disease),  lumbar 02/17/2014   Vitamin D  deficiency 03/18/2013   Trochanteric bursitis of right hip 02/29/2012   INSOMNIA 04/13/2010   Hyperlipidemia 09/17/2007   CERVICAL POLYP 09/17/2007   Attention deficit disorder 09/11/2007   UNSPECIFIED VENOUS INSUFFICIENCY 08/29/2007    PCP: Dr Dorothyann Byars  REFERRING PROVIDER: Corean Celia Sender, PA-C  REFERRING DIAG: Physical Therapy post L knee polyethylene revision   THERAPY DIAG:  Difficulty in walking, not elsewhere classified  Muscle weakness (generalized)  Chronic pain of left knee  Other symptoms and signs involving the musculoskeletal system  Repeated falls  Rationale for Evaluation and Treatment: Rehabilitation  ONSET DATE: 01/25/24  SUBJECTIVE:   SUBJECTIVE STATEMENT: Patient saw PA at orthopedics office today for R knee pain. She was placed in a brace for R knee due to instability in the knee. She has some increased pain in the L leg today in the calf area. She has continued pain in the knee cap area. PA feels the L knee pain is possibly scar tissue. PA wants Wanda Gonzales to stop therapy and wait to see Dr Beckie on June 11th. She was cleared to start water exercises. She has exercises from aquatic therapy from last episode of care for knee and feels comfortable starting water exercises again. Patient returned to work 2 days this week and did OK. She had some increased pain by the end of the day.   Patient reports she is woken up in the middle of the night from L thigh tremors. Patient states she will have bandage removed on Tuesday. Patient states 8/10 pain in knee; states she is able to get up and down okay but has not had much of an appetite from pain.    PERTINENT HISTORY: L TKA ~ 2019; L Baker's cyst removal; L trochanteric bursitis; pelvic pain; HA; depression; anxiety  PAIN:  Are you having pain? Yes: NPRS scale: 2/10 Pain location: L knee - mostly back of knee area  Pain description: throbbing; aching Aggravating  factors: movement; bending and straightening knee  Relieving factors: lying down with leg stretched out; ice; pain meds   PRECAUTIONS: None  WEIGHT BEARING RESTRICTIONS: No  FALLS:  Has patient fallen in last 6 months? Yes. Number of falls 1 Fell getting out of bathtub scrap on elbow no injury to L knee   LIVING ENVIRONMENT: Lives with: lives with their spouse Lives in: House/apartment Stairs: Yes: External: 1 steps; none Has following equipment at home: Single point cane, Walker - 2 wheeled, shower chair, and Grab bars  OCCUPATION: retired Runner, broadcasting/film/video   PATIENT GOALS: get better - decrease pain; using knee for activities   NEXT MD VISIT: 01/25/24  OBJECTIVE:  Note: Objective measures were  completed at Evaluation unless otherwise noted.  DIAGNOSTIC FINDINGS: none available for knees   PATIENT SURVEYS:  LEFS 7/80; 8.8%     SENSATION: WFL  EDEMA:  Minimal edema L knee   MUSCLE LENGTH: Hamstrings: Right 70 deg; Left 55 deg   POSTURE: rounded shoulders, forward head, flexed trunk , and weight shift right    LOWER EXTREMITY ROM:  Active ROM Right  eval Left  Eval  Left 01/01/24 Left  5/6  Hip flexion      Hip extension      Hip abduction      Hip adduction      Hip internal rotation      Hip external rotation      Knee flexion 133 45 106 118  Knee extension +5 -6 -3 -4  Ankle dorsiflexion      Ankle plantarflexion      Ankle inversion      Ankle eversion       (Blank rows = not tested)  LOWER EXTREMITY MMT: assessed with pt in supine  MMT Right eval Left eval Left   Hip flexion  3- 3+ pain in knee  Hip extension  3 5  Hip abduction  3 4+  Hip adduction     Hip internal rotation     Hip external rotation     Knee flexion   5  Knee extension   5  Ankle dorsiflexion     Ankle plantarflexion     Ankle inversion     Ankle eversion      (Blank rows = not tested)  FUNCTIONAL TESTS:  Deferred due to nausea and pain   GAIT: Distance walked: 40 ft   Assistive device utilized: Environmental consultant - 2 wheeled Level of assistance: Complete Independence Comments: decreased wt bearing L LE; poor wt shift to L; decreased hip/knee flexion with toe off to heel strike L; stride length asymmetrical  01/25/24: walking without assistive device poor gait pattern with limp; L LE in IR with toe turned in  [facility] Adult PT Treatment:                                                DATE: 01/25/2024 Therapeutic Exercise: Sitting Sit to stand UE support as needed  x 8  Standing: Wall mini squats 10 Standing L knee flexion x 10 intermittent pain Neuromuscular re-ed: Standing Mini squat x 10 UE support as needed Toe tap to 12 inch step alternating R/L  x 10  Step up 4 inch step L up x 10 x 2; R up x 10 UE support as needed  Supine  Quad set with towel behind knee 5 sec x 10  Small range SLR 10 x 5 sec hold Bridges + RTB for hip abd isometric 2x10   [facility] Adult PT Treatment:                                                DATE: 01/19/2024 Therapeutic Exercise: Standing: Wall mini squats 2x 10 Sit to stand x 5 - poor eccentric control on descent Standing L knee flexion x 10 intermittent pain Neuromuscular re-ed: Quad set with towel behind knee Small range SLR 10 x 5 sec hold Side  Lying: Clamshells --> discontinued d/t pain in lower anterior knee Straight leg hip abd 2x10 Bridges + RTB for hip abd isometric 2x10 Manual: IASTM to L quads and around distal patella MMT/ROM checked  [facility] Adult PT Treatment:                                                DATE: 01/17/2024 Therapeutic Exercise: Recumbent bike L1 x 6 min --> revolutions + subjective intake Neuromuscular re-ed: Step over high yoga block --> functional knee flexion & alignment awareness Small range SLR x10, slow x8 SAQ +2#AW 2x10 Gait: Gait training with focus on functional knee flexion  Modalities: Vaso Lt knee x 10 min low pressure 34 degrees Self Care: Walking program for endurance    [facility]  Adult PT Treatment:                                                DATE: 01/11/24 Therapeutic Exercise: Recumbent bike x 5 mins initially partial revolutions -- progressed to full revolutions Neuromuscular re-ed: Quad set with towel behind knee Small range SLR 10 x 5 sec hold SAQ with bolster 2 x 10 Therapeutic Activity: Standing hip abd 2 x 10 bilat Standing hip ext 2 x 10 bilat Mini squats x 10 Heel raise x 10 Heel slide with strap 10 x 5 sec hold Sit <> stand with step forward/bkwd with Rt LE. Focus on wt shift to LT and equal wt bearing with sit <> stand Modalities: Vaso Lt knee x 10 min low pressure 34 degrees   PATIENT EDUCATION:  Education details: Updated HEP Person educated:Patient Education method: Explanation, Demonstration, Tactile cues, Verbal cues, and Handouts Education comprehension: verbalized understanding, returned demonstration, verbal cues required, tactile cues required, and needs further education  HOME EXERCISE PROGRAM: Access Code: [email or web address] URL: [email or web address] Date: 01/25/2024 Prepared by: Armanii Urbanik  Exercises - Supine Quad Set  - 2 x daily - 7 x weekly - 1 sets - 10 reps - 3 sec  hold - Small Range Straight Leg Raise  - 2 x daily - 7 x weekly - 1 sets - 10 reps - 5 sec  hold - Supine Heel Slide  - 2 x daily - 7 x weekly - 1-2 sets - 5 reps - 10 sec  hold - Seated Knee Flexion  - 2 x daily - 7 x weekly - 1 sets - 5-10 reps - 3-5 sec  hold - Seated Long Arc Quad  - 1 x daily - 7 x weekly - 3 sets - 10 reps - Standing Marching  - 1 x daily - 7 x weekly - 3 sets - 10 reps - Mini Squat with Counter Support  - 1 x daily - 7 x weekly - 3 sets - 10 reps - Sidelying Hip Abduction with Resistance at Thighs  - 1 x daily - 7 x weekly - 3 sets - 10 reps - Gastroc Stretch on Wall  - 2 x daily - 7 x weekly - 1 sets - 3 reps - 30 sec  hold - Hooklying Isometric Clamshell  - 2 x daily - 7 x weekly - 1 sets - 10 reps - 3 sec   hold   ASSESSMENT:  CLINICAL IMPRESSION: Patient returns to day reporting that PA wants her to stop therapy until she sees MD June 12th. Reviewed and modified exercises. Provided written instructions for home program. She was given the OK to start water exercises. Patient has aquatic exercises from previous episode of care that she will use. We will hold PT. See last visit note for current status.   Re-eval; 10th visit note: Wanda Gonzales presents reporting fatigue today after being on her feet all day. Her main complaint is that she cannot sleep well. She has marked pain in lateral knee cap and just distal both with palpation and with prone knee flexion. She also experiences in S/L with clams. Her strength with MMT has improved to WNL except for L hip flexion which is limited by knee pain. Functionally she has weakness with squatting and stand to sit transfers. Her gait has improved significantly and she no longer uses AD. She has met her knee flexion goal, but still lacks full extension. She continues to demonstrate potential for improvement and would benefit from continued skilled therapy to address impairments.      EVAL: Patient is a 68 y.o. female who was seen today for physical therapy evaluation and treatment following L knee polyethylene revision following failed L TKA . She has had several L knee surgeries over the past 6 years. Patient has had persistent L knee pain and limitations in L knee function. Patient presents for evaluation reporting nausea and is unable to participate fully in evaluation. Patient has limited L LE ROM, strength, function. Patient has limited transitional movements; transfers; and bed mobility as well as dependent, abnormal gait pattern. She will benefit from PT to address problems identified.    OBJECTIVE IMPAIRMENTS: Abnormal gait, decreased activity tolerance, decreased mobility, decreased ROM, decreased strength, hypomobility, increased edema, increased fascial  restrictions, impaired flexibility, improper body mechanics, postural dysfunction, and pain.    GOALS: Goals reviewed with patient? Yes  SHORT TERM GOALS: Target date: 01/29/2024  Independent in initial HEP Baseline: Goal status: MET 5/6  2.  Increase AAROM to AROM L knee to 0 degrees extension to 100 degrees flexion  Baseline:  Goal status: PARTIALLY MET - flex 01/23/24  3.  Improved gail pattern with patient to demonstrate more equal wt bearing and wt shift L LE  Baseline:  Goal status: MET 5/6   LONG TERM GOALS: Target date: 03/04/2024  Decrease pain by 50% with pain level no more than 4/10 with gait and transfers  Baseline:  Goal status: INITIAL  2.  Increase AROM L knee to 0 deg extension and 110 deg flexion  Baseline:  Goal status: PARTIALLY MET for flexion 01/23/24  3.  Increase strength to 4/5 to 4+/5 L LE  Baseline:  Goal status: PARTIALLY MET except hip flexion 01/23/24  4.  Independent gait with least restrictive assistive device and good gait pattern  Baseline:  Goal status: INITIAL  5.  Independent in HEP  Baseline:  Goal status: INITIAL  6.  Improve LEFS by 20 points  Baseline:  Goal status: INITIAL   PLAN:  PT FREQUENCY: 2x/week  PT DURATION: 10 weeks  PLANNED INTERVENTIONS: 97110-Therapeutic exercises, 97530- Therapeutic activity, [id number]- Neuromuscular re-education, 97535- Self Care, [id number]- Manual therapy, [id number]- Aquatic Therapy, [phone]- Subsequent splinting/medication, [phone]- Ionotophoresis 4mg /ml Dexamethasone , Patient/Family education, Balance training, Stair training, Taping, Dry Needling, Joint mobilization, Cryotherapy, and Moist heat  PLAN FOR NEXT SESSION: work on eccentric quad strength and hip flexion strength. Progress exercises; education re-knee pain and  rehab process; manual work and modalities as indicated. Pt is interested in aquatic therapy (01/19/24 - recommended patient review aquatic HEP from previous sessions)   Harrie Cazarez P. Ina PT,  MPH 01/25/24 2:10 PM

## 2024-02-02 ENCOUNTER — Encounter: Payer: Self-pay | Admitting: Physical Therapy

## 2024-02-15 ENCOUNTER — Ambulatory Visit (HOSPITAL_BASED_OUTPATIENT_CLINIC_OR_DEPARTMENT_OTHER): Admitting: Physical Therapy

## 2024-02-17 ENCOUNTER — Other Ambulatory Visit: Payer: Self-pay | Admitting: Family Medicine

## 2024-02-17 DIAGNOSIS — F418 Other specified anxiety disorders: Secondary | ICD-10-CM

## 2024-02-17 DIAGNOSIS — E785 Hyperlipidemia, unspecified: Secondary | ICD-10-CM

## 2024-02-20 ENCOUNTER — Ambulatory Visit (HOSPITAL_BASED_OUTPATIENT_CLINIC_OR_DEPARTMENT_OTHER): Admitting: Physical Therapy

## 2024-02-21 ENCOUNTER — Ambulatory Visit: Payer: Medicare Other | Admitting: Family Medicine

## 2024-02-22 ENCOUNTER — Ambulatory Visit (HOSPITAL_BASED_OUTPATIENT_CLINIC_OR_DEPARTMENT_OTHER): Admitting: Physical Therapy

## 2024-02-26 ENCOUNTER — Encounter: Payer: Self-pay | Admitting: Family Medicine

## 2024-02-26 ENCOUNTER — Ambulatory Visit (INDEPENDENT_AMBULATORY_CARE_PROVIDER_SITE_OTHER): Payer: Medicare Other | Admitting: Family Medicine

## 2024-02-26 VITALS — BP 85/64 | HR 75

## 2024-02-26 DIAGNOSIS — R7309 Other abnormal glucose: Secondary | ICD-10-CM | POA: Diagnosis not present

## 2024-02-26 DIAGNOSIS — E785 Hyperlipidemia, unspecified: Secondary | ICD-10-CM

## 2024-02-26 DIAGNOSIS — F418 Other specified anxiety disorders: Secondary | ICD-10-CM | POA: Diagnosis not present

## 2024-02-26 NOTE — Assessment & Plan Note (Addendum)
 Due to recheck lipids she is currently on Crestor  10 mg and says she has been taking it very consistently.  Last LDL in June 2024 was 229.  May need to adjust dose to better control lipids but we started her at 10 mg to make sure that it was well-tolerated.

## 2024-02-26 NOTE — Progress Notes (Signed)
   Established Patient Office Visit  Subjective  Patient ID: Wanda Gonzales, female    DOB: [birth year]  Age: 68 y.o. MRN: [id number]  Chief Complaint  Patient presents with   mood    HPI F/U mood - she is doing well since her left knee revision. Still heaing. She did PT. Still having some pain on the back of the knee.  She is off of the pain medications etc. but she is taking gabapentin  3 mg at nighttime because she is still getting some occasional prickling pin type sensation on that knee.  Some of the motions during PT were little bit more painful.     02/26/2024   12:11 PM 08/23/2023   12:02 PM 07/12/2023    8:24 AM  PHQ9 SCORE ONLY  PHQ-9 Total Score 3 2 0   Tolerating statin well with no S.E.      ROS    Objective:     BP (!) 85/64   Pulse 75   LMP 12/18/2013   SpO2 92%    Physical Exam Vitals and nursing note reviewed.  Constitutional:      Appearance: Normal appearance.  HENT:     Head: Normocephalic and atraumatic.  Eyes:     Conjunctiva/sclera: Conjunctivae normal.  Cardiovascular:     Rate and Rhythm: Normal rate and regular rhythm.  Pulmonary:     Effort: Pulmonary effort is normal.     Breath sounds: Normal breath sounds.  Skin:    General: Skin is warm and dry.  Neurological:     Mental Status: She is alert.  Psychiatric:        Mood and Affect: Mood normal.      No results found for any visits on 02/26/24.    The ASCVD Risk score (Arnett DK, et al., 2019) failed to calculate for the following reasons:   The valid systolic blood pressure range is 90 to 200 mmHg   The valid total cholesterol range is 130 to 320 mg/dL    Assessment & Plan:   Problem List Items Addressed This Visit       Other   Hyperlipidemia   Due to recheck lipids she is currently on Crestor  10 mg and says she has been taking it very consistently.  Last LDL in June 2024 was 229.  May need to adjust dose to better control lipids but we started her at 10 mg to make sure  that it was well-tolerated.      Relevant Orders   CMP14+EGFR   Lipid panel   Hemoglobin A1c   Depression with anxiety   Doing well on current regimen.  PHQ-9 score of 3 and GAD-7 score of 3.  Denies feeling down depressed or hopeless currently and no thoughts of wanting to harm herself.      Other Visit Diagnoses       Abnormal glucose    -  Primary   Relevant Orders   CMP14+EGFR   Lipid panel   Hemoglobin A1c      BP was a little low today she always tends to run on the low end of normal.  But we did give her a cup of water to drink she is not feeling lightheaded or dizzy.  Return in about 4 months (around 06/27/2024) for Mood.    Duaine German, MD

## 2024-02-26 NOTE — Assessment & Plan Note (Signed)
 Doing well on current regimen.  PHQ-9 score of 3 and GAD-7 score of 3.  Denies feeling down depressed or hopeless currently and no thoughts of wanting to harm herself.

## 2024-02-27 ENCOUNTER — Encounter: Payer: Self-pay | Admitting: Family Medicine

## 2024-02-27 ENCOUNTER — Ambulatory Visit: Payer: Self-pay | Admitting: Family Medicine

## 2024-02-27 LAB — LIPID PANEL
Chol/HDL Ratio: 3.7 ratio (ref 0.0–4.4)
Cholesterol, Total: 164 mg/dL (ref 100–199)
HDL: 44 mg/dL (ref >39)
LDL Chol Calc (NIH): 86 mg/dL (ref 0–99)
Triglycerides: 203 mg/dL — ABNORMAL HIGH (ref 0–149)
VLDL Cholesterol Cal: 34 mg/dL (ref 5–40)

## 2024-02-27 LAB — CMP14+EGFR
ALT: 11 IU/L (ref 0–32)
AST: 23 IU/L (ref 0–40)
Albumin: 4.4 g/dL (ref 3.9–4.9)
Alkaline Phosphatase: 82 IU/L (ref 44–121)
BUN/Creatinine Ratio: 24 (ref 12–28)
BUN: 21 mg/dL (ref 8–27)
Bilirubin Total: 0.5 mg/dL (ref 0.0–1.2)
CO2: 21 mmol/L (ref 20–29)
Calcium: 9.1 mg/dL (ref 8.7–10.3)
Chloride: 103 mmol/L (ref 96–106)
Creatinine, Ser: 0.88 mg/dL (ref 0.57–1.00)
Globulin, Total: 2.1 g/dL (ref 1.5–4.5)
Glucose: 84 mg/dL (ref 70–99)
Potassium: 4.5 mmol/L (ref 3.5–5.2)
Sodium: 141 mmol/L (ref 134–144)
Total Protein: 6.5 g/dL (ref 6.0–8.5)
eGFR: 72 mL/min/{1.73_m2} (ref >59)

## 2024-02-27 LAB — HEMOGLOBIN A1C
Est. average glucose Bld gHb Est-mCnc: 103 mg/dL
Hgb A1c MFr Bld: 5.2 % (ref 4.8–5.6)

## 2024-02-27 NOTE — Progress Notes (Signed)
 Hi Rabab, LDL cholesterol looks good but triglycerides are up a little bit just continue to work on healthy diet and regular exercise.  Sodium looks great.  Liver function and kidney function are stable.  A1c looks great at 5.2.

## 2024-02-28 DIAGNOSIS — M25361 Other instability, right knee: Secondary | ICD-10-CM | POA: Diagnosis not present

## 2024-03-10 ENCOUNTER — Encounter: Payer: Self-pay | Admitting: Family Medicine

## 2024-03-13 MED ORDER — DOXEPIN HCL 10 MG/ML PO CONC: ORAL | 12 refills | Status: AC

## 2024-03-13 NOTE — Addendum Note (Signed)
 Addended by: Tayla Panozzo D on: 03/13/2024 07:52 AM   Modules accepted: Orders

## 2024-03-18 ENCOUNTER — Ambulatory Visit: Attending: Student

## 2024-03-18 ENCOUNTER — Other Ambulatory Visit: Payer: Self-pay

## 2024-03-18 DIAGNOSIS — R2689 Other abnormalities of gait and mobility: Secondary | ICD-10-CM | POA: Insufficient documentation

## 2024-03-18 DIAGNOSIS — M25561 Pain in right knee: Secondary | ICD-10-CM | POA: Diagnosis not present

## 2024-03-18 DIAGNOSIS — M6281 Muscle weakness (generalized): Secondary | ICD-10-CM | POA: Insufficient documentation

## 2024-03-18 NOTE — Therapy (Signed)
 OUTPATIENT PHYSICAL THERAPY LOWER EXTREMITY EVALUATION   Patient Name: Wanda Gonzales MRN: [id number] DOB:1955/11/12, 68 y.o., female Today's Date: 03/18/2024  END OF SESSION:  PT End of Session - 03/18/24 1312     Visit Number 1    Number of Visits 17    Date for PT Re-Evaluation 05/18/24    Authorization Type BCBS MCR    Progress Note Due on Visit 10    PT Start Time 1315    PT Stop Time 1354    PT Time Calculation (min) 39 min    Activity Tolerance Patient tolerated treatment well    Behavior During Therapy WFL for tasks assessed/performed          Past Medical History:  Diagnosis Date   Abnormal Pap smear, atypical squamous cells of undetermined sign (ASC-US ) 1998   treated with cryo, CIN I   ADD (attention deficit disorder) 02/23/2012   easily distracted.   Arthritis 02/23/2012   Rt. hip and lt. arm   Closed displaced fracture of fifth metatarsal bone of left foot 03/31/2017   Depression with anxiety    GERD (gastroesophageal reflux disease)    History of kidney stones    History of osteoporosis    PONV (postoperative nausea and vomiting) 02/23/2012   always has PONV   Post-nasal drainage 02/23/2012   frequent a problem,causes a cough and mucus buildup in throat   Urethral polyp 1998   Dr. Duncan   Varicose veins    Venous insufficiency    chronic-denies any problems   Past Surgical History:  Procedure Laterality Date   blood clot removed Left 01/2012   blood clot removed from left hand    BREAST BIOPSY Right 02/14/2012   Fibroadenoma   COLONOSCOPY  08/2003   repeat 5 yrs.   COLONOSCOPY W/ BIOPSIES  12/2008   1 polyp recheck 5 yrs   EXCISION/RELEASE BURSA HIP  02/29/2012   HAND SURGERY  02/23/2012   blood clot excised palm left hand 01-18-12   KNEE ARTHROSCOPY W/ MENISCAL REPAIR  10/2012   KNEE ARTHROSCOPY WITH EXCISION BAKER'S CYST Left 10/2012   meniscal repair   left knee replacement      NASAL SINUS SURGERY     ORIF METATARSAL FRACTURE Left  04/07/2017   Left 5th Metatarsal   scar tissue removed left knee      SHOULDER SURGERY Right 02/2015   Dr. 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   History of total knee arthroplasty, left 12/20/2023   Tremor of left hand 08/23/2023   Pelvic pressure in female 07/14/2023   Proteinuria 07/14/2023   Pelvic pain 07/14/2023   Irritant contact dermatitis 02/20/2023   IBS (irritable bowel syndrome with diarrhea) 08/17/2022   Hearing aid worn 02/10/2022   Trochanteric bursitis of left hip 02/06/2022   Frequent falls 11/26/2020   Depression with anxiety    History of osteoporosis    Age-related osteoporosis without current pathological fracture 07/29/2020   Osteoporosis 07/27/2020   Rhinitis 03/18/2020   Pain in left knee 09/07/2018   Seborrheic keratosis 11/23/2017   S/P TKR (  total knee replacement), left 08/25/2016   Benign paroxysmal positional vertigo 08/25/2016   Mid back pain, chronic 12/16/2015   Cervical strain 11/21/2014   DDD (degenerative disc disease), lumbar 02/17/2014   Vitamin D  deficiency 03/18/2013   Trochanteric bursitis of right hip 02/29/2012   INSOMNIA 04/13/2010   Hyperlipidemia 09/17/2007   CERVICAL POLYP 09/17/2007   Attention deficit disorder 09/11/2007   UNSPECIFIED VENOUS INSUFFICIENCY 08/29/2007    PCP: Alvan Dorothyann BIRCH, MD  REFERRING PROVIDER:   Reena Roxie CROME, PA    REFERRING DIAG: [phone] (ICD-10-CM) - Other instability, right knee   THERAPY DIAG:  Acute pain of right knee  Muscle weakness (generalized)  Other abnormalities of gait and mobility  Rationale  for Evaluation and Treatment: Rehabilitation  ONSET DATE: April 2025   SUBJECTIVE:   SUBJECTIVE STATEMENT: Patient reports the Rt knee is getting better since initial onset in April. It was popping a lot and felt like the knee cap was moving out of place. Every now and then it will still pop and shift (at least 5 times daily). It hurts when it pops and shifts and she will sometimes have to push the knee cap back in. She reports this has been happening to the Rt knee since her Lt knee surgery in April. Has also noticed some popping in the Lt knee recently as well. Does not feel like the knee gives away when walking. Also notes Lt buttock pain that began Saturday.   PERTINENT HISTORY: Multiple Lt knee surgeries- most recent total knee revision 12/20/23 Osteoporosis Venous insufficiency  PAIN:  Are you having pain? Yes: NPRS scale: none currently; at worst 3 Pain location: Rt anterolateral knee Pain description: feels like something is coming out of the joint Aggravating factors: unknown Relieving factors: pushing on the kneecap   PRECAUTIONS: Fall  RED FLAGS: None   WEIGHT BEARING RESTRICTIONS: No  FALLS:  Has patient fallen in last 6 months? Yes. 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 I don't want my knees to hurt.   NEXT MD VISIT: 04/23/24  OBJECTIVE:  Note: Objective measures were completed at Evaluation unless otherwise noted.  DIAGNOSTIC FINDINGS: none on file   PATIENT SURVEYS:  LEFS: 39/80  COGNITION: Overall cognitive status: Within functional limits for tasks assessed     SENSATION: Not tested   MUSCLE LENGTH: Hamstrings: Rt WNL  POSTURE: mild genu valgum,  foot inversion   PALPATION: No palpable tenderness   LOWER EXTREMITY ROM:  Active ROM Right eval Left eval  Hip flexion    Hip extension    Hip abduction    Hip adduction    Hip internal rotation    Hip external rotation    Knee flexion WNL   Knee extension WNL   Ankle dorsiflexion    Ankle plantarflexion    Ankle inversion    Ankle eversion     (Blank rows = not tested)  LOWER EXTREMITY MMT:  MMT Right eval Left eval  Hip flexion 4- 4  Hip extension 4- 4-  Hip abduction 4- 4-  Hip adduction    Hip internal rotation    Hip external rotation    Knee flexion 5 4+  Knee extension 5 4+  Ankle dorsiflexion    Ankle plantarflexion    Ankle inversion    Ankle eversion     (Blank rows = not tested)  LOWER EXTREMITY SPECIAL TESTS:  Knee special tests: Patellafemoral apprehension test: negative  FUNCTIONAL TESTS:  SLS: LLE: 3 seconds, RLE: 2 seconds  Stair negotiation- turns sideways to go down 4 inch step; UE support   GAIT: Distance walked: 20 ft  Assistive device utilized: None Level of assistance: Complete Independence Comments: Trendelenburg, limited knee/hip flexion                                                                                                                                [facility] Adult PT Treatment:                                                DATE: 03/18/24 Therapeutic Exercise: Demonstrated,performed, and issued initial HEP.       PATIENT EDUCATION:  Education details: see treatment Person educated: Patient Education method: Explanation, Demonstration, Tactile cues, Verbal cues, and Handouts Education comprehension: verbalized understanding, returned demonstration, verbal cues required, tactile cues required, and needs further education  HOME EXERCISE PROGRAM: Access Code: [email or web address] URL: [email or web address] Date: 03/18/2024 Prepared by: Lucie Meeter  Exercises - Supine Active Straight Leg Raise  - 1 x daily  - 7 x weekly - 2 sets - 10 reps - Sidelying Hip Abduction  - 1 x daily - 7 x weekly - 2 sets - 10 reps - Seated Knee Extension with Resistance  - 1 x daily - 7 x weekly - 2 sets - 10 reps - Tandem Stance  - 1 x daily - 7 x weekly - 3 sets - max  hold - Heel Raises with Counter Support  - 1 x daily - 7 x weekly - 2 sets - 10 reps  ASSESSMENT:  CLINICAL IMPRESSION: Patient is a 68 y.o. female who was seen today for physical therapy evaluation and treatment for Rt knee pain that began following her Lt total knee revision in April 2025. She describes patellar subluxation, but has negative apprehension testing today.  She is noted to have bilateral hip weakness, postural abnormalities, Lt knee weakness, gait and balance impairments. She will benefit from skilled PT to address the above stated deficits in order to optimize her function.   OBJECTIVE IMPAIRMENTS: Abnormal gait, decreased activity tolerance, decreased balance, decreased endurance, decreased knowledge of condition, difficulty walking, decreased strength, impaired flexibility, improper body mechanics, postural dysfunction, and pain.   ACTIVITY LIMITATIONS: carrying, lifting, bending, standing, stairs, and locomotion level  PARTICIPATION LIMITATIONS: meal prep, cleaning, laundry, shopping, and community activity  PERSONAL FACTORS: Age, Fitness, Past/current experiences, Time since onset of injury/illness/exacerbation, and 3+ comorbidities: see PMH above are also affecting patient's functional outcome.   REHAB POTENTIAL: Good  CLINICAL DECISION  MAKING: Evolving/moderate complexity  EVALUATION COMPLEXITY: Moderate   GOALS: Goals reviewed with patient? Yes  SHORT TERM GOALS: Target date: 04/15/2024   Patient will be independent and compliant with initial HEP.   Baseline: issued at eval  Goal status: INITIAL  2.  Patient will maintain SLS for at least 5 seconds to improve gait stability.  Baseline: see above Goal status:  INITIAL  3.  Patient will report </= 2 instances daily of Rt knee popping indicative of improvement in current condition.  Baseline: > 5  Goal status: INITIAL   LONG TERM GOALS: Target date: 05/18/24  Patient will score >/= 50/80 on the LEFS (MCID is 9) to signify clinically meaningful improvement in functional abilities.   Baseline: see above Goal status: INITIAL  2.  Patient will demonstrate at least 4+/5 hip abductor strength to improve stability about the chain with prolonged walking activity.  Baseline: see above Goal status: INITIAL  3.  Patient will be able to complete step down from 4 inch step forward facing without UE support to improve curb negotiation.  Baseline: side step, UE support  Goal status: INITIAL  4.  Patient will demonstrate at least 4+/5 hip flexor strength to improve ability to negotiate stairs.  Baseline: see above Goal status: INITIAL  5.  Patient will be independent with advanced home program to progress/maintain current level of function.  Baseline: initial HEP issued  Goal status: INITIAL    PLAN:  PT FREQUENCY: 2x/week  PT DURATION: 8 weeks  PLANNED INTERVENTIONS: 97164- PT Re-evaluation, 97750- Physical Performance Testing, 97110-Therapeutic exercises, 97530- Therapeutic activity, [id number]- Neuromuscular re-education, 97535- Self Care, 02859- Manual therapy, [id number]- Gait training, [phone]- Aquatic Therapy, [phone] (1-2 muscles), [phone] (3+ muscles)- Dry Needling, Cryotherapy, and Moist heat  PLAN FOR NEXT SESSION: hip strengthening, static balance, quad eccentric.   Seith Aikey, PT, DPT, ATC 03/18/24 3:26 PM

## 2024-03-19 DIAGNOSIS — K219 Gastro-esophageal reflux disease without esophagitis: Secondary | ICD-10-CM | POA: Diagnosis not present

## 2024-03-19 DIAGNOSIS — T402X5A Adverse effect of other opioids, initial encounter: Secondary | ICD-10-CM | POA: Diagnosis not present

## 2024-03-19 DIAGNOSIS — R159 Full incontinence of feces: Secondary | ICD-10-CM | POA: Diagnosis not present

## 2024-03-19 DIAGNOSIS — K5903 Drug induced constipation: Secondary | ICD-10-CM | POA: Diagnosis not present

## 2024-03-26 ENCOUNTER — Ambulatory Visit: Attending: Student | Admitting: Physical Therapy

## 2024-03-26 ENCOUNTER — Encounter: Payer: Self-pay | Admitting: Physical Therapy

## 2024-03-26 DIAGNOSIS — R262 Difficulty in walking, not elsewhere classified: Secondary | ICD-10-CM | POA: Diagnosis not present

## 2024-03-26 DIAGNOSIS — R2689 Other abnormalities of gait and mobility: Secondary | ICD-10-CM | POA: Insufficient documentation

## 2024-03-26 DIAGNOSIS — M545 Low back pain, unspecified: Secondary | ICD-10-CM | POA: Diagnosis not present

## 2024-03-26 DIAGNOSIS — G8929 Other chronic pain: Secondary | ICD-10-CM | POA: Diagnosis not present

## 2024-03-26 DIAGNOSIS — R29898 Other symptoms and signs involving the musculoskeletal system: Secondary | ICD-10-CM | POA: Diagnosis not present

## 2024-03-26 DIAGNOSIS — M6281 Muscle weakness (generalized): Secondary | ICD-10-CM | POA: Insufficient documentation

## 2024-03-26 DIAGNOSIS — M25561 Pain in right knee: Secondary | ICD-10-CM | POA: Insufficient documentation

## 2024-03-26 NOTE — Therapy (Signed)
 OUTPATIENT PHYSICAL THERAPY LOWER EXTREMITY TREATMENT   Patient Name: Wanda Gonzales MRN: [id number] DOB:Oct 12, 1955, 68 y.o., female Today's Date: 03/26/2024  END OF SESSION:  PT End of Session - 03/26/24 1356     Visit Number 2    Number of Visits 17    Date for PT Re-Evaluation 05/18/24    Authorization Type [employer] MCR    Authorization - Visit Number 1    Progress Note Due on Visit 10    PT Start Time 1315    PT Stop Time 1356    PT Time Calculation (min) 41 min    Activity Tolerance Patient tolerated treatment well    Behavior During Therapy WFL for tasks assessed/performed           Past Medical History:  Diagnosis Date   Abnormal Pap smear, atypical squamous cells of undetermined sign (ASC-US ) 1998   treated with cryo, CIN I   ADD (attention deficit disorder) 02/23/2012   easily distracted.   Arthritis 02/23/2012   Rt. hip and lt. arm   Closed displaced fracture of fifth metatarsal bone of left foot 03/31/2017   Depression with anxiety    GERD (gastroesophageal reflux disease)    History of kidney stones    History of osteoporosis    PONV (postoperative nausea and vomiting) 02/23/2012   always has PONV   Post-nasal drainage 02/23/2012   frequent a problem,causes a cough and mucus buildup in throat   Urethral polyp 1998   Dr. Duncan   Varicose veins    Venous insufficiency    chronic-denies any problems   Past Surgical History:  Procedure Laterality Date   blood clot removed Left 01/2012   blood clot removed from left hand    BREAST BIOPSY Right 02/14/2012   Fibroadenoma   COLONOSCOPY  08/2003   repeat 5 yrs.   COLONOSCOPY W/ BIOPSIES  12/2008   1 polyp recheck 5 yrs   EXCISION/RELEASE BURSA HIP  02/29/2012   HAND SURGERY  02/23/2012   blood clot excised palm left hand 01-18-12   KNEE ARTHROSCOPY W/ MENISCAL REPAIR  10/2012   KNEE ARTHROSCOPY WITH EXCISION BAKER'S CYST Left 10/2012   meniscal repair   left knee replacement      NASAL SINUS SURGERY      ORIF METATARSAL FRACTURE Left 04/07/2017   Left 5th Metatarsal   scar tissue removed left knee      SHOULDER SURGERY Right 02/2015   Dr. 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   History of total knee arthroplasty, left 12/20/2023   Tremor of left hand 08/23/2023   Pelvic pressure in female 07/14/2023   Proteinuria 07/14/2023   Pelvic pain 07/14/2023   Irritant contact dermatitis 02/20/2023   IBS (irritable bowel syndrome with diarrhea) 08/17/2022   Hearing aid worn 02/10/2022   Trochanteric bursitis of left hip 02/06/2022   Frequent falls 11/26/2020   Depression with anxiety    History of osteoporosis    Age-related osteoporosis without current pathological fracture 07/29/2020   Osteoporosis 07/27/2020   Rhinitis 03/18/2020   Pain in left knee 09/07/2018  Seborrheic keratosis 11/23/2017   S/P TKR (total knee replacement), left 08/25/2016   Benign paroxysmal positional vertigo 08/25/2016   Mid back pain, chronic 12/16/2015   Cervical strain 11/21/2014   DDD (degenerative disc disease), lumbar 02/17/2014   Vitamin D  deficiency 03/18/2013   Trochanteric bursitis of right hip 02/29/2012   INSOMNIA 04/13/2010   Hyperlipidemia 09/17/2007   CERVICAL POLYP 09/17/2007   Attention deficit disorder 09/11/2007   UNSPECIFIED VENOUS INSUFFICIENCY 08/29/2007    PCP: Alvan Dorothyann BIRCH, MD  REFERRING PROVIDER:   Reena Roxie CROME, PA    REFERRING DIAG: [phone] (ICD-10-CM) - Other instability, right knee   THERAPY DIAG:  Acute pain of right knee  Muscle weakness (generalized)  Rationale for  Evaluation and Treatment: Rehabilitation  ONSET DATE: April 2025   SUBJECTIVE:   SUBJECTIVE STATEMENT: Pt states her Lt knee was popping and hurt her during the night. Her Rt knee is the same  PERTINENT HISTORY: Multiple Lt knee surgeries- most recent total knee revision 12/20/23 Osteoporosis Venous insufficiency   Patient reports the Rt knee is getting better since initial onset in April. It was popping a lot and felt like the knee cap was moving out of place. Every now and then it will still pop and shift (at least 5 times daily). It hurts when it pops and shifts and she will sometimes have to push the knee cap back in. She reports this has been happening to the Rt knee since her Lt knee surgery in April. Has also noticed some popping in the Lt knee recently as well. Does not feel like the knee gives away when walking. Also notes Lt buttock pain that began Saturday.  PAIN:  Are you having pain? Yes: NPRS scale: none currently; at worst 3 Pain location: Rt anterolateral knee Pain description: feels like something is coming out of the joint Aggravating factors: unknown Relieving factors: pushing on the kneecap   PRECAUTIONS: Fall  RED FLAGS: None   WEIGHT BEARING RESTRICTIONS: No  FALLS:  Has patient fallen in last 6 months? Yes. 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 I don't want my knees to hurt.   NEXT MD VISIT: 04/23/24  OBJECTIVE:  Note: Objective measures were completed at Evaluation unless otherwise noted.  DIAGNOSTIC FINDINGS: none on file   PATIENT SURVEYS:  LEFS: 39/80  COGNITION: Overall cognitive status: Within functional limits for tasks  assessed     SENSATION: Not tested   MUSCLE LENGTH: Hamstrings: Rt WNL  POSTURE: mild genu valgum, foot inversion   PALPATION: No palpable tenderness   LOWER EXTREMITY ROM:  Active ROM Right eval Left eval  Hip flexion    Hip extension    Hip abduction    Hip adduction    Hip internal rotation    Hip external rotation    Knee flexion WNL   Knee extension WNL   Ankle dorsiflexion    Ankle plantarflexion    Ankle inversion    Ankle eversion     (Blank rows = not tested)  LOWER EXTREMITY MMT:  MMT Right eval Left eval  Hip flexion 4- 4  Hip extension 4- 4-  Hip abduction 4- 4-  Hip adduction    Hip  internal rotation    Hip external rotation    Knee flexion 5 4+  Knee extension 5 4+  Ankle dorsiflexion    Ankle plantarflexion    Ankle inversion    Ankle eversion     (Blank rows = not tested)  LOWER EXTREMITY SPECIAL TESTS:  Knee special tests: Patellafemoral apprehension test: negative  FUNCTIONAL TESTS:  SLS: LLE: 3 seconds, RLE: 2 seconds  Stair negotiation- turns sideways to go down 4 inch step; UE support   GAIT: Distance walked: 20 ft  Assistive device utilized: None Level of assistance: Complete Independence Comments: Trendelenburg, limited knee/hip flexion                                                                                                                                [facility] Adult PT Treatment:                                                DATE: 03/26/24 Therapeutic Exercise/Activity: Supine SLR 2 x 10 Sidelying hip abd 2 x 8 Prone Bent knee hip ext x 10 Straight leg hip ext x 10 Seated LAQ red TB 2 x 10 LAQ with hip add squeeze (green bolster) 2 x 10 Stand to sit focus on eccentric control Standing Heel raises 2 x 10 Tandem stance 2 x 30 sec bilat Step up fwd 4'' step x 10 --> step up fwd 6'' step x 10 Step down fwd 4'' step x 10 Step up laterally 6'' x 10 - slight pain Hip abd red TB 2 x 10 Hip ext red TB 2 x  10   [facility] Adult PT Treatment:                                                DATE: 03/18/24 Therapeutic Exercise: Demonstrated,performed, and issued initial HEP.       PATIENT EDUCATION:  Education details: see treatment Person educated: Patient Education method: Explanation, Demonstration, Tactile cues, Verbal cues, and Handouts Education comprehension: verbalized understanding, returned demonstration, verbal cues required, tactile cues required, and needs further education  HOME EXERCISE PROGRAM: Access Code: [email or web address] URL: [email or web address] Date: 03/18/2024 Prepared by: Lucie Meeter  Exercises - Supine Active Straight Leg Raise  - 1 x daily - 7 x weekly - 2 sets - 10 reps - Sidelying Hip Abduction  - 1 x daily - 7 x weekly - 2 sets - 10 reps - Seated Knee Extension with Resistance  - 1 x daily - 7 x weekly - 2 sets - 10 reps - Tandem Stance  - 1 x daily - 7 x weekly - 3 sets - max  hold - Heel Raises with  Counter Support  - 1 x daily - 7 x weekly - 2 sets - 10 reps  ASSESSMENT:  CLINICAL IMPRESSION: Pt with good response to progression of exercises. She fatigues quickly with hip abd and ext and requires rest breaks during these sets. PT encouraged pt to return to water exercise to progress strength and endurance.  OBJECTIVE IMPAIRMENTS: Abnormal gait, decreased activity tolerance, decreased balance, decreased endurance, decreased knowledge of condition, difficulty walking, decreased strength, impaired flexibility, improper body mechanics, postural dysfunction, and pain.      GOALS: Goals reviewed with patient? Yes  SHORT TERM GOALS: Target date: 04/15/2024   Patient will be independent and compliant with initial HEP.   Baseline: issued at eval  Goal status: INITIAL  2.  Patient will maintain SLS for at least 5 seconds to improve gait stability.  Baseline: see above Goal status: INITIAL  3.  Patient will report </= 2 instances daily of Rt knee  popping indicative of improvement in current condition.  Baseline: > 5  Goal status: INITIAL   LONG TERM GOALS: Target date: 05/18/24  Patient will score >/= 50/80 on the LEFS (MCID is 9) to signify clinically meaningful improvement in functional abilities.   Baseline: see above Goal status: INITIAL  2.  Patient will demonstrate at least 4+/5 hip abductor strength to improve stability about the chain with prolonged walking activity.  Baseline: see above Goal status: INITIAL  3.  Patient will be able to complete step down from 4 inch step forward facing without UE support to improve curb negotiation.  Baseline: side step, UE support  Goal status: INITIAL  4.  Patient will demonstrate at least 4+/5 hip flexor strength to improve ability to negotiate stairs.  Baseline: see above Goal status: INITIAL  5.  Patient will be independent with advanced home program to progress/maintain current level of function.  Baseline: initial HEP issued  Goal status: INITIAL    PLAN:  PT FREQUENCY: 2x/week  PT DURATION: 8 weeks  PLANNED INTERVENTIONS: 97164- PT Re-evaluation, 97750- Physical Performance Testing, 97110-Therapeutic exercises, 97530- Therapeutic activity, [id number]- Neuromuscular re-education, 97535- Self Care, 02859- Manual therapy, [id number]- Gait training, [phone]- Aquatic Therapy, [phone] (1-2 muscles), 20561 (3+ muscles)- Dry Needling, Cryotherapy, and Moist heat  PLAN FOR NEXT SESSION: hip strengthening, static balance, quad eccentric.   Darice Conine, PT,DPT07/08/251:57 PM

## 2024-04-01 ENCOUNTER — Ambulatory Visit

## 2024-04-01 DIAGNOSIS — G8929 Other chronic pain: Secondary | ICD-10-CM | POA: Diagnosis not present

## 2024-04-01 DIAGNOSIS — M25561 Pain in right knee: Secondary | ICD-10-CM | POA: Diagnosis not present

## 2024-04-01 DIAGNOSIS — M6281 Muscle weakness (generalized): Secondary | ICD-10-CM

## 2024-04-01 DIAGNOSIS — M545 Low back pain, unspecified: Secondary | ICD-10-CM | POA: Diagnosis not present

## 2024-04-01 DIAGNOSIS — R262 Difficulty in walking, not elsewhere classified: Secondary | ICD-10-CM | POA: Diagnosis not present

## 2024-04-01 DIAGNOSIS — R2689 Other abnormalities of gait and mobility: Secondary | ICD-10-CM | POA: Diagnosis not present

## 2024-04-01 DIAGNOSIS — R29898 Other symptoms and signs involving the musculoskeletal system: Secondary | ICD-10-CM | POA: Diagnosis not present

## 2024-04-01 NOTE — Therapy (Signed)
 OUTPATIENT PHYSICAL THERAPY LOWER EXTREMITY TREATMENT   Patient Name: Wanda Gonzales MRN: [id number] DOB:03-24-1956, 68 y.o., female Today's Date: 04/01/2024  END OF SESSION:  PT End of Session - 04/01/24 1317     Visit Number 3    Number of Visits 17    Date for PT Re-Evaluation 05/18/24    Authorization Type [employer] MCR    Progress Note Due on Visit 10    PT Start Time 1317    PT Stop Time 1355    PT Time Calculation (min) 38 min    Activity Tolerance Patient limited by pain    Behavior During Therapy [facility] for tasks assessed/performed            Past Medical History:  Diagnosis Date   Abnormal Pap smear, atypical squamous cells of undetermined sign (ASC-US ) 1998   treated with cryo, CIN I   ADD (attention deficit disorder) 02/23/2012   easily distracted.   Arthritis 02/23/2012   Rt. hip and lt. arm   Closed displaced fracture of fifth metatarsal bone of left foot 03/31/2017   Depression with anxiety    GERD (gastroesophageal reflux disease)    History of kidney stones    History of osteoporosis    PONV (postoperative nausea and vomiting) 02/23/2012   always has PONV   Post-nasal drainage 02/23/2012   frequent a problem,causes a cough and mucus buildup in throat   Urethral polyp 1998   Dr. Duncan   Varicose veins    Venous insufficiency    chronic-denies any problems   Past Surgical History:  Procedure Laterality Date   blood clot removed Left 01/2012   blood clot removed from left hand    BREAST BIOPSY Right 02/14/2012   Fibroadenoma   COLONOSCOPY  08/2003   repeat 5 yrs.   COLONOSCOPY W/ BIOPSIES  12/2008   1 polyp recheck 5 yrs   EXCISION/RELEASE BURSA HIP  02/29/2012   HAND SURGERY  02/23/2012   blood clot excised palm left hand 01-18-12   KNEE ARTHROSCOPY W/ MENISCAL REPAIR  10/2012   KNEE ARTHROSCOPY WITH EXCISION BAKER'S CYST Left 10/2012   meniscal repair   left knee replacement      NASAL SINUS SURGERY     ORIF METATARSAL FRACTURE Left  04/07/2017   Left 5th Metatarsal   scar tissue removed left knee      SHOULDER SURGERY Right 02/2015   Dr. 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   History of total knee arthroplasty, left 12/20/2023   Tremor of left hand 08/23/2023   Pelvic pressure in female 07/14/2023   Proteinuria 07/14/2023   Pelvic pain 07/14/2023   Irritant contact dermatitis 02/20/2023   IBS (irritable bowel syndrome with diarrhea) 08/17/2022   Hearing aid worn 02/10/2022   Trochanteric bursitis of left hip 02/06/2022   Frequent falls 11/26/2020   Depression with anxiety    History of osteoporosis    Age-related osteoporosis without current pathological fracture 07/29/2020   Osteoporosis 07/27/2020   Rhinitis 03/18/2020   Pain in left knee 09/07/2018   Seborrheic keratosis 11/23/2017  S/P TKR (total knee replacement), left 08/25/2016   Benign paroxysmal positional vertigo 08/25/2016   Mid back pain, chronic 12/16/2015   Cervical strain 11/21/2014   DDD (degenerative disc disease), lumbar 02/17/2014   Vitamin D  deficiency 03/18/2013   Trochanteric bursitis of right hip 02/29/2012   INSOMNIA 04/13/2010   Hyperlipidemia 09/17/2007   CERVICAL POLYP 09/17/2007   Attention deficit disorder 09/11/2007   UNSPECIFIED VENOUS INSUFFICIENCY 08/29/2007    PCP: Alvan Dorothyann BIRCH, MD  REFERRING PROVIDER:   Reena Roxie CROME, PA    REFERRING DIAG: [phone] (ICD-10-CM) - Other instability, right knee   THERAPY DIAG:  Acute pain of right knee  Muscle weakness (generalized)  Other abnormalities of gait and mobility  Rationale  for Evaluation and Treatment: Rehabilitation  ONSET DATE: April 2025   SUBJECTIVE:   SUBJECTIVE STATEMENT: Patient reports her low back is really what's hurting. This began on Thursday. She was sitting in the car for awhile. Going up the stairs seems to bother the knee.   PERTINENT HISTORY: Multiple Lt knee surgeries- most recent total knee revision 12/20/23 Osteoporosis Venous insufficiency   Patient reports the Rt knee is getting better since initial onset in April. It was popping a lot and felt like the knee cap was moving out of place. Every now and then it will still pop and shift (at least 5 times daily). It hurts when it pops and shifts and she will sometimes have to push the knee cap back in. She reports this has been happening to the Rt knee since her Lt knee surgery in April. Has also noticed some popping in the Lt knee recently as well. Does not feel like the knee gives away when walking. Also notes Lt buttock pain that began Saturday.  PAIN:  Are you having pain? Yes: NPRS scale: 9 Pain location: low back Pain description: just hurts. Aggravating factors: standing, walking, transfers  Relieving factors: rest   PRECAUTIONS: Fall  RED FLAGS: None   WEIGHT BEARING RESTRICTIONS: No  FALLS:  Has patient fallen in last 6 months? Yes. 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 I don't want my knees to hurt.   NEXT MD VISIT: 04/23/24  OBJECTIVE:  Note: Objective measures were completed at Evaluation unless otherwise noted.  DIAGNOSTIC FINDINGS: none on file   PATIENT SURVEYS:  LEFS: 39/80  COGNITION: Overall cognitive status: Within functional limits for tasks  assessed     SENSATION: Not tested   MUSCLE LENGTH: Hamstrings: Rt WNL  POSTURE: mild genu valgum, foot inversion   PALPATION: No palpable tenderness   LOWER EXTREMITY ROM:  Active ROM Right eval Left eval  Hip flexion    Hip extension    Hip abduction    Hip adduction    Hip internal rotation    Hip external rotation    Knee flexion WNL   Knee extension WNL   Ankle dorsiflexion    Ankle plantarflexion    Ankle inversion    Ankle eversion     (Blank rows = not tested)  LOWER EXTREMITY MMT:  MMT Right eval Left eval  Hip flexion 4- 4  Hip extension 4- 4-  Hip abduction 4- 4-  Hip adduction  Hip internal rotation    Hip external rotation    Knee flexion 5 4+  Knee extension 5 4+  Ankle dorsiflexion    Ankle plantarflexion    Ankle inversion    Ankle eversion     (Blank rows = not tested)  LOWER EXTREMITY SPECIAL TESTS:  Knee special tests: Patellafemoral apprehension test: negative  FUNCTIONAL TESTS:  SLS: LLE: 3 seconds, RLE: 2 seconds  Stair negotiation- turns sideways to go down 4 inch step; UE support   GAIT: Distance walked: 20 ft  Assistive device utilized: None Level of assistance: Complete Independence Comments: Trendelenburg, limited knee/hip flexion    [facility] Adult PT Treatment:                                                DATE: 04/01/24 Therapeutic Exercise: Prone quad stretch x 1 min each  Prone HS curl 2 x 10, green band  HEP review Neuromuscular re-ed: Prone hip extension 2 x 10  Sidelying hip abduction 2 x 10  Hooklying hip abduction blue band 2 x 10   Self Care: F/u with PCP regarding acute back pain                                                                                                                          [facility] Adult PT Treatment:                                                DATE: 03/26/24 Therapeutic Exercise/Activity: Supine SLR 2 x 10 Sidelying hip abd 2 x 8 Prone Bent knee hip ext x 10 Straight leg  hip ext x 10 Seated LAQ red TB 2 x 10 LAQ with hip add squeeze (green bolster) 2 x 10 Stand to sit focus on eccentric control Standing Heel raises 2 x 10 Tandem stance 2 x 30 sec bilat Step up fwd 4'' step x 10 --> step up fwd 6'' step x 10 Step down fwd 4'' step x 10 Step up laterally 6'' x 10 - slight pain Hip abd red TB 2 x 10 Hip ext red TB 2 x 10   [facility] Adult PT Treatment:                                                DATE: 03/18/24 Therapeutic Exercise: Demonstrated,performed, and issued initial HEP.       PATIENT EDUCATION:  Education details: see treatment Person educated: Patient Education method: Explanation Education comprehension: verbalized understanding  HOME EXERCISE PROGRAM: Access Code: [email or web address] URL: [email or web address] Date: 03/18/2024 Prepared by:  Scarlet Abad  Exercises - Supine Active Straight Leg Raise  - 1 x daily - 7 x weekly - 2 sets - 10 reps - Sidelying Hip Abduction  - 1 x daily - 7 x weekly - 2 sets - 10 reps - Seated Knee Extension with Resistance  - 1 x daily - 7 x weekly - 2 sets - 10 reps - Tandem Stance  - 1 x daily - 7 x weekly - 3 sets - max  hold - Heel Raises with Counter Support  - 1 x daily - 7 x weekly - 2 sets - 10 reps  ASSESSMENT:  CLINICAL IMPRESSION: Patient arrives with high level of low back pain that began after a long car ride late last week. We continued with hip and knee strengthening avoiding activity that would aggravate the low back. She tolerated prone strengthening well reporting no back pain in this position. Was recommended to f/u with her PCP regarding acute exacerbation of low back pain with patient verbalizing understanding.   OBJECTIVE IMPAIRMENTS: Abnormal gait, decreased activity tolerance, decreased balance, decreased endurance, decreased knowledge of condition, difficulty walking, decreased strength, impaired flexibility, improper body mechanics, postural dysfunction, and pain.       GOALS: Goals reviewed with patient? Yes  SHORT TERM GOALS: Target date: 04/15/2024   Patient will be independent and compliant with initial HEP.   Baseline: issued at eval  Goal status: INITIAL  2.  Patient will maintain SLS for at least 5 seconds to improve gait stability.  Baseline: see above Goal status: INITIAL  3.  Patient will report </= 2 instances daily of Rt knee popping indicative of improvement in current condition.  Baseline: > 5  Goal status: INITIAL   LONG TERM GOALS: Target date: 05/18/24  Patient will score >/= 50/80 on the LEFS (MCID is 9) to signify clinically meaningful improvement in functional abilities.   Baseline: see above Goal status: INITIAL  2.  Patient will demonstrate at least 4+/5 hip abductor strength to improve stability about the chain with prolonged walking activity.  Baseline: see above Goal status: INITIAL  3.  Patient will be able to complete step down from 4 inch step forward facing without UE support to improve curb negotiation.  Baseline: side step, UE support  Goal status: INITIAL  4.  Patient will demonstrate at least 4+/5 hip flexor strength to improve ability to negotiate stairs.  Baseline: see above Goal status: INITIAL  5.  Patient will be independent with advanced home program to progress/maintain current level of function.  Baseline: initial HEP issued  Goal status: INITIAL    PLAN:  PT FREQUENCY: 2x/week  PT DURATION: 8 weeks  PLANNED INTERVENTIONS: [id number]- PT Re-evaluation, 97750- Physical Performance Testing, 97110-Therapeutic exercises, 97530- Therapeutic activity, [id number]- Neuromuscular re-education, 97535- Self Care, [id number]- Manual therapy, [id number]- Gait training, [phone]- Aquatic Therapy, [phone] (1-2 muscles), [id number] (3+ muscles)- Dry Needling, Cryotherapy, and Moist heat  PLAN FOR NEXT SESSION: hip strengthening, static balance, quad eccentric. How is low back pain/did she f/u with PCP?  Jaelene Garciagarcia, PT,  DPT, ATC 04/01/24 1:56 PM

## 2024-04-02 ENCOUNTER — Ambulatory Visit (INDEPENDENT_AMBULATORY_CARE_PROVIDER_SITE_OTHER): Admitting: Family Medicine

## 2024-04-02 VITALS — BP 114/70 | HR 84

## 2024-04-02 DIAGNOSIS — M545 Low back pain, unspecified: Secondary | ICD-10-CM | POA: Diagnosis not present

## 2024-04-02 NOTE — Patient Instructions (Signed)
 Take 3 tabs of Ibuprofen  3 time a day for the next 5-7 days.

## 2024-04-02 NOTE — Progress Notes (Signed)
   Acute Office Visit  Subjective:     Patient ID: Wanda Gonzales, female    DOB: 18-Apr-1956, 68 y.o.   MRN: [id number]  Chief Complaint  Patient presents with   Back Pain    Pt reports that this pain is in her low back it started last week it comes and goes the pain today is 3/10 she describes the pain as a throbbing pain in her low back that makes her feels like she's stuck. She has tried Tylenol , stretches, and heat.     HPI Patient is in today for low back pain that goes across her back she has had this pain before it is not new.  She says it flares from time to time.  Has tried Tylenol , stretches and heat.  Physical therapy gave her a few stretches to do but they are primarily working on her left knee as she recently had left knee replacement.  She wonders if some of it is also related to just trying to get used to that left knee replacement.  Though her pain did start after a long car ride.  She feels like the back will make it difficult for her to stand up straight at times.  ROS      Objective:    BP 114/70   Pulse 84   LMP 12/18/2013   SpO2 95%    Physical Exam Vitals reviewed.  Constitutional:      Appearance: Normal appearance.  HENT:     Head: Normocephalic.  Pulmonary:     Effort: Pulmonary effort is normal.  Musculoskeletal:     Comments: Mild tenderness just to the left of the lumbar spine.  No pain radiating into the buttock or legs.  She is able to twist and rotate.  Neurological:     Mental Status: She is alert and oriented to person, place, and time.  Psychiatric:        Mood and Affect: Mood normal.        Behavior: Behavior normal.     No results found for any visits on 04/02/24.      Assessment & Plan:   Problem List Items Addressed This Visit   None Visit Diagnoses       Acute bilateral low back pain without sciatica    -  Primary   Relevant Orders   Ambulatory referral to Physical Therapy      Bilateral low back pain-we did discuss  continuing with stretches.  Reviewed cat and camel with her she is also been trying to lay on her stomach and that helps a little bit.  I am going to send a formal referral to physical therapy so they can work on her back as well as her knee.  Okay to take the methocarbamol  that she had leftover from the orthopedist in the evenings I really want her to take an anti-inflammatory so recommend 6 or milligrams of ibuprofen  3 times a day for at least the next 5 to 7 days.  If not improving then please let me know without any specific fall or injury I do not think x-ray is needed at this point in time.  No orders of the defined types were placed in this encounter.   No follow-ups on file.  Dorothyann Byars, MD

## 2024-04-03 ENCOUNTER — Encounter: Payer: Self-pay | Admitting: Rehabilitative and Restorative Service Providers"

## 2024-04-03 ENCOUNTER — Ambulatory Visit: Admitting: Rehabilitative and Restorative Service Providers"

## 2024-04-03 DIAGNOSIS — M545 Low back pain, unspecified: Secondary | ICD-10-CM | POA: Diagnosis not present

## 2024-04-03 DIAGNOSIS — M6281 Muscle weakness (generalized): Secondary | ICD-10-CM | POA: Diagnosis not present

## 2024-04-03 DIAGNOSIS — R2689 Other abnormalities of gait and mobility: Secondary | ICD-10-CM | POA: Diagnosis not present

## 2024-04-03 DIAGNOSIS — R262 Difficulty in walking, not elsewhere classified: Secondary | ICD-10-CM | POA: Diagnosis not present

## 2024-04-03 DIAGNOSIS — M25561 Pain in right knee: Secondary | ICD-10-CM | POA: Diagnosis not present

## 2024-04-03 DIAGNOSIS — R29898 Other symptoms and signs involving the musculoskeletal system: Secondary | ICD-10-CM

## 2024-04-03 DIAGNOSIS — G8929 Other chronic pain: Secondary | ICD-10-CM | POA: Diagnosis not present

## 2024-04-03 NOTE — Therapy (Signed)
 OUTPATIENT PHYSICAL THERAPY LOWER EXTREMITY TREATMENT   Patient Name: Wanda Gonzales MRN: [id number] DOB:08-06-1956, 68 y.o., female Today's Date: 04/03/2024  END OF SESSION:      Past Medical History:  Diagnosis Date   Abnormal Pap smear, atypical squamous cells of undetermined sign (ASC-US ) 1998   treated with cryo, CIN I   ADD (attention deficit disorder) 02/23/2012   easily distracted.   Arthritis 02/23/2012   Rt. hip and lt. arm   Closed displaced fracture of fifth metatarsal bone of left foot 03/31/2017   Depression with anxiety    GERD (gastroesophageal reflux disease)    History of kidney stones    History of osteoporosis    PONV (postoperative nausea and vomiting) 02/23/2012   always has PONV   Post-nasal drainage 02/23/2012   frequent a problem,causes a cough and mucus buildup in throat   Urethral polyp 1998   Dr. Duncan   Varicose veins    Venous insufficiency    chronic-denies any problems   Past Surgical History:  Procedure Laterality Date   blood clot removed Left 01/2012   blood clot removed from left hand    BREAST BIOPSY Right 02/14/2012   Fibroadenoma   COLONOSCOPY  08/2003   repeat 5 yrs.   COLONOSCOPY W/ BIOPSIES  12/2008   1 polyp recheck 5 yrs   EXCISION/RELEASE BURSA HIP  02/29/2012   HAND SURGERY  02/23/2012   blood clot excised palm left hand 01-18-12   KNEE ARTHROSCOPY W/ MENISCAL REPAIR  10/2012   KNEE ARTHROSCOPY WITH EXCISION BAKER'S CYST Left 10/2012   meniscal repair   left knee replacement      NASAL SINUS SURGERY     ORIF METATARSAL FRACTURE Left 04/07/2017   Left 5th Metatarsal   scar tissue removed left knee      SHOULDER SURGERY Right 02/2015   Dr. 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   History of total knee arthroplasty, left 12/20/2023   Tremor of left hand 08/23/2023   Pelvic pressure in female 07/14/2023   Proteinuria 07/14/2023   Pelvic pain 07/14/2023   Irritant contact dermatitis 02/20/2023   IBS (irritable bowel syndrome with diarrhea) 08/17/2022   Hearing aid worn 02/10/2022   Trochanteric bursitis of left hip 02/06/2022   Frequent falls 11/26/2020   Depression with anxiety    History of osteoporosis    Age-related osteoporosis without current pathological fracture 07/29/2020   Osteoporosis 07/27/2020   Rhinitis 03/18/2020   Pain in left knee 09/07/2018   Seborrheic keratosis 11/23/2017   S/P TKR (total knee replacement), left 08/25/2016   Benign paroxysmal positional vertigo 08/25/2016   Mid back pain, chronic 12/16/2015   Cervical strain 11/21/2014   DDD (degenerative disc disease), lumbar 02/17/2014   Vitamin D  deficiency 03/18/2013   Trochanteric bursitis of right hip 02/29/2012   INSOMNIA 04/13/2010   Hyperlipidemia 09/17/2007   CERVICAL POLYP 09/17/2007   Attention deficit disorder 09/11/2007   UNSPECIFIED VENOUS INSUFFICIENCY 08/29/2007    PCP: Alvan Dorothyann BIRCH, MD  REFERRING PROVIDER:   Reena Roxie LITTIE, PA; Alvan Dorothyann BIRCH, MD  REFERRING DIAG: M25.361 (ICD-10-CM) - Other instability, right knee; M54.50 (ICD-10-CM) - Acute bilateral low back pain without sciatica    THERAPY DIAG:  Other symptoms and signs involving the musculoskeletal system  Chronic bilateral low back pain without sciatica  Rationale for Evaluation and Treatment: Rehabilitation  ONSET DATE: April 2025     LBP 03/28/24  SUBJECTIVE:   SUBJECTIVE STATEMENT: Patient reports her low back has been really hurting since Thursday after she was sitting in the car for 5.5 hours. She having pain in the low back  on both sides. Symptoms are improving and she is able to stand and walk better. She has most pain when she gets up in the morning and after sitting > 1 hour and getting up from sitting; going up steps is worse than going down steps    PERTINENT HISTORY: History of LBP for 10 years  Multiple Lt knee surgeries- most recent total knee revision 12/20/23 Osteoporosis Venous insufficiency   Patient reports the Rt knee is getting better since initial onset in April. It was popping a lot and felt like the knee cap was moving out of place. Every now and then it will still pop and shift (at least 5 times daily). It hurts when it pops and shifts and she will sometimes have to push the knee cap back in. She reports this has been happening to the Rt knee since her Lt knee surgery in April. Has also noticed some popping in the Lt knee recently as well. Does not feel like the knee gives away when walking. Also notes Lt buttock pain that began Saturday.  PAIN:  Are you having pain? Yes: NPRS scale: 3 Pain location: low back Pain description: aching, dull Aggravating factors:  when she gets up in the morning and after sitting > 1 hour and getting up from sitting Relieving factors: rest; heat; pool    PRECAUTIONS: Fall  RED FLAGS: None   WEIGHT BEARING RESTRICTIONS: No  FALLS:  Has patient fallen in last 6 months? Yes. 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 I don't want my knees to hurt.   NEXT MD VISIT: 04/23/24  OBJECTIVE:  Note: Objective measures were completed at Evaluation unless otherwise noted.  DIAGNOSTIC FINDINGS: none on file   PATIENT SURVEYS:  LEFS: 39/80  PSFS:  Getting up in the  morning 9 Standing from sitting > 1 hour 5 Going up steps 4 18/3 = 6  COGNITION: Overall cognitive status: Within functional limits for tasks assessed     SENSATION: Not tested   MUSCLE LENGTH: Hamstrings: Rt WNL  POSTURE: mild genu valgum, foot inversion   PALPATION: No palpable tenderness   04/03/24: muscular tightness R > L lumbar paraspinals; R posterior hip through gluts and piriformis; bilat hip flexors  Pain with PA mobs lower lumbar spine   04/03/24: LUMBAR ROM:   Active 04/03/24  A/PROM  eval  Flexion 70% LBP  Extension 60% min LBP  Right lateral flexion 80%  Left lateral flexion 80%   Right rotation 30%  Left rotation 30%   (Blank rows = not tested)    LOWER EXTREMITY ROM: 04/03/24: hip ROM - WFLs   Active ROM Right eval Left eval  Hip flexion    Hip extension    Hip abduction    Hip adduction    Hip internal rotation    Hip external rotation    Knee flexion WNL   Knee extension WNL   Ankle dorsiflexion    Ankle plantarflexion    Ankle inversion    Ankle eversion     (Blank rows = not tested)  LOWER EXTREMITY MMT:  MMT Right eval Right  04/03/24 Left eval Left  04/03/24  Hip flexion 4- 4 4 4   Hip extension 4- 4- 4- 4  Hip abduction 4- 3+painful LB 4- 4  Hip adduction      Hip internal rotation      Hip external rotation      Knee flexion 5  4+   Knee extension 5  4+   Ankle dorsiflexion      Ankle plantarflexion      Ankle inversion      Ankle eversion       (Blank rows = not tested)  LOWER EXTREMITY SPECIAL TESTS:  Knee special tests: Patellafemoral apprehension test: negative  FUNCTIONAL TESTS:  SLS: LLE: 3 seconds, RLE: 2 seconds  Stair negotiation- turns sideways to go down 4 inch step; UE support   04/03/24: five times sit to stand: 19.15 sce using hands on upper thighs knees dropping in; plop down on descent   GAIT: Distance walked: 20 ft  Assistive device utilized: None Level of assistance: Complete  Independence Comments: Trendelenburg, limited knee/hip flexion   [facility] Adult PT Treatment:                                                DATE: 04/03/24 Therapeutic Exercise: Prone press up x 10  Glut set prone 10 sec x 10  Therapeutic Activity: Core activation 10 sec x 1 0 Bridge 5 sec x 10  Diaphragmatic breathing  Shoulder flexion red TB btn hands x 10  Supine marching x 10 R/L  Self Care: Avoid sitting with legs crossed  Avoid sitting with weight shifted toward one hip     [facility] Adult PT Treatment:                                                DATE: 04/01/24 Therapeutic Exercise: Prone quad stretch x 1 min each  Prone HS curl 2 x 10, green band  HEP review Neuromuscular re-ed: Prone hip extension 2 x 10  Sidelying hip abduction 2 x 10  Hooklying hip abduction blue band 2 x 10   Self Care: F/u with PCP regarding acute back pain  [facility] Adult PT Treatment:                                                DATE: 03/26/24 Therapeutic Exercise/Activity: Supine SLR 2 x 10 Sidelying hip abd 2 x 8 Prone Bent knee hip ext x 10 Straight leg hip ext x 10 Seated LAQ red TB 2 x 10 LAQ with hip add squeeze (green bolster) 2 x 10 Stand to sit focus on eccentric control Standing Heel raises 2 x 10 Tandem stance 2 x 30 sec bilat Step up fwd 4'' step x 10 --> step up fwd 6'' step x 10 Step down fwd 4'' step x 10 Step up laterally 6'' x 10 - slight pain Hip abd red TB 2 x 10 Hip ext red TB 2 x 10   [facility] Adult PT Treatment:                                                DATE: 03/18/24 Therapeutic Exercise: Demonstrated,performed, and issued initial HEP.       PATIENT EDUCATION:  Education details: see treatment Person educated: Patient Education method: Explanation Education comprehension: verbalized understanding  HOME EXERCISE PROGRAM: Access Code:  [email or web address] URL: [email or web address] Date: 04/03/2024 Prepared by: Hayde Kilgour  Exercises - Supine Active Straight Leg Raise  - 1 x daily - 7 x weekly - 2 sets - 10 reps - Sidelying Hip Abduction  - 1 x daily - 7 x weekly - 2 sets - 10 reps - Seated Knee Extension with Resistance  - 1 x daily - 7 x weekly - 2 sets - 10 reps - Tandem Stance  - 1 x daily - 7 x weekly - 3 sets - max  hold - Heel Raises with Counter Support  - 1 x daily - 7 x weekly - 2 sets - 10 reps Back - 04/03/24 - Prone Press Up  - 2 x daily - 7 x weekly - 1 sets - 10 reps - 2-3 sec  hold - Prone Gluteal Sets  - 2 x daily - 7 x weekly - 1 sets - 10 reps - 10 sec  hold - Supine Transversus Abdominis Bracing - Hands on Stomach  - 2 x daily - 7 x weekly - 1 sets - 10 reps - 10 sec  hold - Supine Bridge  - 2 x daily - 7 x weekly - 1-2 sets - 10 reps - 5-10 sec  hold - Supine Shoulder Flexion Extension AAROM with Dowel  - 1 x daily - 7 x weekly - 1-2 sets - 10 reps - 3 sec  hold  ASSESSMENT:  CLINICAL IMPRESSION: Wanda Gonzales reports onset of bilat LBP after a 5.5 hour car ride 03/28/24. She has experienced continue back pain since the onset but intensity has decreased. She has a history of chronic LBP over the past 10+ years. She currently has pain with functional activities including getting out of bed in the morning; sit to stand after sitting ~ 1 hour and ascending and descending steps. She has decreased lumbar ROM; weakness in core and bilat LE's; pain with palpation through the lumbar spine and poor movement patterns/positions. Patient has poor core strength and stability as well as bilat  LE weakness contributing to back pain and dysfunction. Patient will benefit from PT to address problems identified. She will benefit form PT to address problems identified.   OBJECTIVE IMPAIRMENTS: Abnormal gait, decreased activity tolerance, decreased balance, decreased endurance, decreased knowledge of condition, difficulty walking,  decreased strength, impaired flexibility, improper body mechanics, postural dysfunction, and pain.      GOALS: Goals reviewed with patient? Yes  SHORT TERM GOALS: Target date: 04/15/2024   Patient will be independent and compliant with initial HEP.   Baseline: issued at eval  Goal status: INITIAL  2.  Patient will maintain SLS for at least 5 seconds to improve gait stability.  Baseline: see above Goal status: INITIAL  3.  Patient will report </= 2 instances daily of Rt knee popping indicative of improvement in current condition.  Baseline: > 5  Goal status: INITIAL   LONG TERM GOALS: Target date: 05/18/24  Patient will score >/= 50/80 on the LEFS (MCID is 9) to signify clinically meaningful improvement in functional abilities.   Baseline: see above Goal status: INITIAL  2.  Patient will demonstrate at least 4+/5 hip abductor strength to improve stability about the chain with prolonged walking activity.  Baseline: see above Goal status: INITIAL  3.  Patient will be able to complete step down from 4 inch step forward facing without UE support to improve curb negotiation.  Baseline: side step, UE support  Goal status: INITIAL  4.  Patient will demonstrate at least 4+/5 hip flexor strength to improve ability to negotiate stairs.  Baseline: see above Goal status: INITIAL  5.  Patient will be independent with advanced home program to progress/maintain current level of function.  Baseline: initial HEP issued  Goal status: INITIAL  6. Patient reports decreased pain when getting out of bed in the morning to no more than 1-2/10   Baseline:   Goal status: NEW 04/03/24  7. Patient reports ability to sit for > 1 hour without increased pain when standing   Baseline: pain with standing after sitting < 1 hour   Goal Status: NEW 04/03/24  8. Patient reports ability to ascend and descend 4 or more steps with minimal LBP increase of no more than 1-2/10    Baseline: pain with all  stairs   Goal status: NEW 04/03/24    PLAN:  PT FREQUENCY: 2x/week  PT DURATION: 8 weeks  PLANNED INTERVENTIONS: [id number]- PT Re-evaluation, 97750- Physical Performance Testing, 97110-Therapeutic exercises, 97530- Therapeutic activity, [id number]- Neuromuscular re-education, 97535- Self Care, [id number]- Manual therapy, [id number]- Gait training, [id number]- Aquatic Therapy, [id number] (1-2 muscles), [id number] (3+ muscles)- Dry Needling, Cryotherapy, and Moist heat  PLAN FOR NEXT SESSION: hip strengthening, static balance, quad eccentric. Progress with core stabilization and strengthening for LBP as patient tolerates   Cobin Cadavid P. Ina PT, MPH 04/03/24 2:37 PM

## 2024-04-08 ENCOUNTER — Ambulatory Visit

## 2024-04-08 DIAGNOSIS — R29898 Other symptoms and signs involving the musculoskeletal system: Secondary | ICD-10-CM | POA: Diagnosis not present

## 2024-04-08 DIAGNOSIS — M545 Low back pain, unspecified: Secondary | ICD-10-CM | POA: Diagnosis not present

## 2024-04-08 DIAGNOSIS — M6281 Muscle weakness (generalized): Secondary | ICD-10-CM | POA: Diagnosis not present

## 2024-04-08 DIAGNOSIS — R2689 Other abnormalities of gait and mobility: Secondary | ICD-10-CM | POA: Diagnosis not present

## 2024-04-08 DIAGNOSIS — R262 Difficulty in walking, not elsewhere classified: Secondary | ICD-10-CM | POA: Diagnosis not present

## 2024-04-08 DIAGNOSIS — G8929 Other chronic pain: Secondary | ICD-10-CM | POA: Diagnosis not present

## 2024-04-08 DIAGNOSIS — M25561 Pain in right knee: Secondary | ICD-10-CM | POA: Diagnosis not present

## 2024-04-08 NOTE — Therapy (Signed)
 OUTPATIENT PHYSICAL THERAPY LOWER EXTREMITY TREATMENT   Patient Name: Wanda Gonzales MRN: [id number] DOB:[birth year], 68 y.o., female Today's Date: 04/08/2024  END OF SESSION:  PT End of Session - 04/08/24 0915     Visit Number 5    Number of Visits 17    Date for PT Re-Evaluation 05/18/24    Authorization Type [employer] MCR    Progress Note Due on Visit 10    PT Start Time 0846    PT Stop Time 0929    PT Time Calculation (min) 43 min    Activity Tolerance Patient tolerated treatment well    Behavior During Therapy [facility] for tasks assessed/performed         Past Medical History:  Diagnosis Date   Abnormal Pap smear, atypical squamous cells of undetermined sign (ASC-US ) 1998   treated with cryo, CIN I   ADD (attention deficit disorder) 02/23/2012   easily distracted.   Arthritis 02/23/2012   Rt. hip and lt. arm   Closed displaced fracture of fifth metatarsal bone of left foot 03/31/2017   Depression with anxiety    GERD (gastroesophageal reflux disease)    History of kidney stones    History of osteoporosis    PONV (postoperative nausea and vomiting) 02/23/2012   always has PONV   Post-nasal drainage 02/23/2012   frequent a problem,causes a cough and mucus buildup in throat   Urethral polyp 1998   Dr. Duncan   Varicose veins    Venous insufficiency    chronic-denies any problems   Past Surgical History:  Procedure Laterality Date   blood clot removed Left 01/2012   blood clot removed from left hand    BREAST BIOPSY Right 02/14/2012   Fibroadenoma   COLONOSCOPY  08/2003   repeat 5 yrs.   COLONOSCOPY W/ BIOPSIES  12/2008   1 polyp recheck 5 yrs   EXCISION/RELEASE BURSA HIP  02/29/2012   HAND SURGERY  02/23/2012   blood clot excised palm left hand 01-18-12   KNEE ARTHROSCOPY W/ MENISCAL REPAIR  10/2012   KNEE ARTHROSCOPY WITH EXCISION BAKER'S CYST Left 10/2012   meniscal repair   left knee replacement      NASAL SINUS SURGERY     ORIF METATARSAL FRACTURE Left  04/07/2017   Left 5th Metatarsal   scar tissue removed left knee      SHOULDER SURGERY Right 02/2015   Dr. 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   History of total knee arthroplasty, left 12/20/2023   Tremor of left hand 08/23/2023   Pelvic pressure in female 07/14/2023   Proteinuria 07/14/2023   Pelvic pain 07/14/2023   Irritant contact dermatitis 02/20/2023   IBS (irritable bowel syndrome with diarrhea) 08/17/2022   Hearing aid worn 02/10/2022   Trochanteric bursitis of left hip 02/06/2022   Frequent falls 11/26/2020   Depression with anxiety    History of osteoporosis    Age-related osteoporosis without current pathological fracture 07/29/2020   Osteoporosis 07/27/2020   Rhinitis 03/18/2020   Pain in left knee 09/07/2018   Seborrheic keratosis 11/23/2017   S/P TKR (total  knee replacement), left 08/25/2016   Benign paroxysmal positional vertigo 08/25/2016   Mid back pain, chronic 12/16/2015   Cervical strain 11/21/2014   DDD (degenerative disc disease), lumbar 02/17/2014   Vitamin D  deficiency 03/18/2013   Trochanteric bursitis of right hip 02/29/2012   INSOMNIA 04/13/2010   Hyperlipidemia 09/17/2007   CERVICAL POLYP 09/17/2007   Attention deficit disorder 09/11/2007   UNSPECIFIED VENOUS INSUFFICIENCY 08/29/2007    PCP: Alvan Dorothyann BIRCH, MD  REFERRING PROVIDER:   Reena Roxie CROME, PA; Alvan Dorothyann BIRCH, MD    REFERRING DIAG: [phone] (ICD-10-CM) - Other instability, right knee; M54.50 (ICD-10-CM) - Acute bilateral low back pain without sciatica    THERAPY DIAG:  Other symptoms and  signs involving the musculoskeletal system  Chronic bilateral low back pain without sciatica  Rationale for Evaluation and Treatment: Rehabilitation  ONSET DATE: April 2025     LBP 03/28/24  SUBJECTIVE:   SUBJECTIVE STATEMENT: Patient reports her back is feeling better today, states 2/10 pain.   PERTINENT HISTORY: History of LBP for 10 years  Multiple Lt knee surgeries- most recent total knee revision 12/20/23 Osteoporosis Venous insufficiency   Patient reports the Rt knee is getting better since initial onset in April. It was popping a lot and felt like the knee cap was moving out of place. Every now and then it will still pop and shift (at least 5 times daily). It hurts when it pops and shifts and she will sometimes have to push the knee cap back in. She reports this has been happening to the Rt knee since her Lt knee surgery in April. Has also noticed some popping in the Lt knee recently as well. Does not feel like the knee gives away when walking. Also notes Lt buttock pain that began Saturday.  PAIN:  Are you having pain? Yes: NPRS scale: 3 Pain location: low back Pain description: aching, dull Aggravating factors:  when she gets up in the morning and after sitting > 1 hour and getting up from sitting Relieving factors: rest; heat; pool    PRECAUTIONS: Fall  RED FLAGS: None   WEIGHT BEARING RESTRICTIONS: No  FALLS:  Has patient fallen in last 6 months? Yes. 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 I don't want my knees to hurt.   NEXT MD VISIT: 04/23/24  OBJECTIVE:  Note: Objective measures were completed at Evaluation unless otherwise noted.  DIAGNOSTIC FINDINGS: none on  file   PATIENT SURVEYS:  LEFS: 39/80  PSFS:  Getting up in the morning 9 Standing from sitting > 1 hour 5 Going up steps 4 18/3 = 6  COGNITION: Overall cognitive status: Within functional limits for tasks assessed     SENSATION: Not tested   MUSCLE LENGTH: Hamstrings: Rt WNL  POSTURE: mild genu valgum, foot inversion   PALPATION: No palpable tenderness   04/03/24: muscular tightness R > L lumbar paraspinals; R posterior hip through gluts and piriformis; bilat hip flexors  Pain with PA mobs lower lumbar spine   04/03/24: LUMBAR ROM:   Active 04/03/24  A/PROM  eval  Flexion 70% LBP  Extension 60% min LBP  Right lateral flexion 80%  Left lateral flexion 80%   Right rotation 30%  Left rotation 30%   (Blank rows = not tested)    LOWER EXTREMITY ROM: 04/03/24: hip ROM - WFLs   Active ROM Right eval Left eval  Hip flexion    Hip extension    Hip abduction    Hip adduction    Hip internal rotation    Hip external rotation    Knee flexion WNL   Knee extension WNL   Ankle dorsiflexion    Ankle plantarflexion    Ankle inversion    Ankle eversion     (Blank rows = not tested)  LOWER EXTREMITY MMT:  MMT Right eval Right  04/03/24 Left eval Left  04/03/24  Hip flexion 4- 4 4 4   Hip extension 4- 4- 4- 4  Hip abduction 4- 3+painful LB 4- 4  Hip adduction      Hip internal rotation      Hip external rotation      Knee flexion 5  4+   Knee extension 5  4+   Ankle dorsiflexion      Ankle plantarflexion      Ankle inversion      Ankle eversion       (Blank rows = not tested)  LOWER EXTREMITY SPECIAL TESTS:  Knee special tests: Patellafemoral apprehension test: negative  FUNCTIONAL TESTS:  SLS: LLE: 3 seconds, RLE: 2 seconds  Stair negotiation- turns sideways to go down 4 inch step; UE support   04/03/24: five times sit to stand: 19.15 sce using hands on upper thighs knees dropping in; plop down on descent   GAIT: Distance walked: 20 ft  Assistive  device utilized: None Level of assistance: Complete Independence Comments: Trendelenburg, limited knee/hip flexion   [facility] Adult PT Treatment:                                                DATE: 04/08/2024 Therapeutic Exercise: Self-massage piriformis with tennis ball  Seated figure 4 stretch (Lt) DKTC --> bothered back of Lt knee Seated forward flexion with red PB Paloff press in staggered stance + blue TB LTR S/L open books Seated forward trunk flexion stretch Neuromuscular re-ed: Supine --> sitting diaphragmatic breathing Seated pelvic tilts on dyno disc Seated on dyno disc + slow foot hover for abdominal bracing Unilateral 90/90 leg + opp hand bracing yoga block for PF activation Seated low march for abdominal bracing    [facility] Adult PT Treatment:                                                DATE: 04/03/24 Therapeutic Exercise: Prone press up x 10  Glut set prone 10 sec x 10  Therapeutic Activity: Core activation 10 sec x 1 0 Bridge 5 sec x 10  Diaphragmatic breathing  Shoulder flexion red TB btn hands x 10  Supine marching x 10 R/L  Self Care: Avoid sitting with legs crossed  Avoid sitting with weight shifted toward one hip     [facility] Adult PT Treatment:  DATE: 04/01/24 Therapeutic Exercise: Prone quad stretch x 1 min each  Prone HS curl 2 x 10, green band  HEP review Neuromuscular re-ed: Prone hip extension 2 x 10  Sidelying hip abduction 2 x 10  Hooklying hip abduction blue band 2 x 10   Self Care: F/u with PCP regarding acute back pain                                                                                                                            PATIENT EDUCATION:  Education details: see treatment Person educated: Patient Education method: Explanation Education comprehension: verbalized understanding  HOME EXERCISE PROGRAM: Access Code: [email or web address] URL: [email or web address] Date:  04/08/2024 Prepared by: Lamarr Price  Exercises - Supine Active Straight Leg Raise  - 1 x daily - 7 x weekly - 2 sets - 10 reps - Sidelying Hip Abduction  - 1 x daily - 7 x weekly - 2 sets - 10 reps - Seated Knee Extension with Resistance  - 1 x daily - 7 x weekly - 2 sets - 10 reps - Tandem Stance  - 1 x daily - 7 x weekly - 3 sets - max  hold - Heel Raises with Counter Support  - 1 x daily - 7 x weekly - 2 sets - 10 reps  Back: - Prone Press Up  - 2 x daily - 7 x weekly - 1 sets - 10 reps - 2-3 sec  hold - Prone Gluteal Sets  - 2 x daily - 7 x weekly - 1 sets - 10 reps - 10 sec  hold - Supine Transversus Abdominis Bracing - Hands on Stomach  - 2 x daily - 7 x weekly - 1 sets - 10 reps - 10 sec  hold - Supine Bridge  - 2 x daily - 7 x weekly - 1-2 sets - 10 reps - 5-10 sec  hold - Supine Shoulder Flexion Extension AAROM with Dowel  - 1 x daily - 7 x weekly - 1-2 sets - 10 reps - 3 sec  hold - Supine Lower Trunk Rotation  - 1 x daily - 7 x weekly - 1 sets - 10 reps - Sidelying Open Book Thoracic Lumbar Rotation and Extension  - 1 x daily - 7 x weekly - 1 sets - 10 reps - Seated Flexion Stretch  - 1 x daily - 7 x weekly - 1 sets - 10 reps  ASSESSMENT:  CLINICAL IMPRESSION: Patient had difficulty engaging abdominal muscles and maintaining abdominal bracing mechanics to support leg lifts in supine and sitting. Frequent cueing provided throughout session for proper form and and postural alignment. Added exercises for patient to in bed upon waking up to address reports of increased pain/stiffness in the mornings.    OBJECTIVE IMPAIRMENTS: Abnormal gait, decreased activity tolerance, decreased balance, decreased endurance, decreased knowledge of condition, difficulty walking, decreased strength, impaired flexibility, improper body mechanics, postural dysfunction, and pain.  GOALS: Goals reviewed with patient? Yes  SHORT TERM GOALS: Target date: 04/15/2024  Patient will be  independent and compliant with initial HEP.  Baseline: issued at eval  Goal status: INITIAL  2.  Patient will maintain SLS for at least 5 seconds to improve gait stability.  Baseline: see above Goal status: INITIAL  3.  Patient will report </= 2 instances daily of Rt knee popping indicative of improvement in current condition.  Baseline: > 5  Goal status: INITIAL   LONG TERM GOALS: Target date: 05/18/24  Patient will score >/= 50/80 on the LEFS (MCID is 9) to signify clinically meaningful improvement in functional abilities.  Baseline: see above Goal status: INITIAL  2.  Patient will demonstrate at least 4+/5 hip abductor strength to improve stability about the chain with prolonged walking activity.  Baseline: see above Goal status: INITIAL  3.  Patient will be able to complete step down from 4 inch step forward facing without UE support to improve curb negotiation.  Baseline: side step, UE support  Goal status: INITIAL  4.  Patient will demonstrate at least 4+/5 hip flexor strength to improve ability to negotiate stairs.  Baseline: see above Goal status: INITIAL  5.  Patient will be independent with advanced home program to progress/maintain current level of function.  Baseline: initial HEP issued  Goal status: INITIAL  6. Patient reports decreased pain when getting out of bed in the morning to no more than 1-2/10   Baseline:   Goal status: NEW 04/03/24  7. Patient reports ability to sit for > 1 hour without increased pain when standing   Baseline: pain with standing after sitting < 1 hour   Goal Status: NEW 04/03/24  8. Patient reports ability to ascend and descend 4 or more steps with minimal LBP increase of no more than 1-2/10    Baseline: pain with all stairs   Goal status: NEW 04/03/24    PLAN:  PT FREQUENCY: 2x/week  PT DURATION: 8 weeks  PLANNED INTERVENTIONS: [id number]- PT Re-evaluation, 97750- Physical Performance Testing, 97110-Therapeutic exercises, 97530-  Therapeutic activity, [id number]- Neuromuscular re-education, 97535- Self Care, [id number]- Manual therapy, [id number]- Gait training, [id number]- Aquatic Therapy, [phone] (1-2 muscles), [id number] (3+ muscles)- Dry Needling, Cryotherapy, and Moist heat  PLAN FOR NEXT SESSION: hip strengthening, static balance, quad eccentric. Progress with core stabilization and strengthening for LBP as patient tolerates   Lamarr Price, PTA 04/08/24 10:33 AM

## 2024-04-10 ENCOUNTER — Ambulatory Visit

## 2024-04-10 DIAGNOSIS — G8929 Other chronic pain: Secondary | ICD-10-CM

## 2024-04-10 DIAGNOSIS — M25561 Pain in right knee: Secondary | ICD-10-CM | POA: Diagnosis not present

## 2024-04-10 DIAGNOSIS — R2689 Other abnormalities of gait and mobility: Secondary | ICD-10-CM | POA: Diagnosis not present

## 2024-04-10 DIAGNOSIS — M545 Low back pain, unspecified: Secondary | ICD-10-CM | POA: Diagnosis not present

## 2024-04-10 DIAGNOSIS — R29898 Other symptoms and signs involving the musculoskeletal system: Secondary | ICD-10-CM | POA: Diagnosis not present

## 2024-04-10 DIAGNOSIS — R262 Difficulty in walking, not elsewhere classified: Secondary | ICD-10-CM | POA: Diagnosis not present

## 2024-04-10 DIAGNOSIS — M6281 Muscle weakness (generalized): Secondary | ICD-10-CM | POA: Diagnosis not present

## 2024-04-10 NOTE — Therapy (Signed)
 OUTPATIENT PHYSICAL THERAPY LOWER EXTREMITY TREATMENT   Patient Name: Wanda Gonzales MRN: [id number] DOB:04-02-1956, 68 y.o., female Today's Date: 04/10/2024  END OF SESSION:  PT End of Session - 04/10/24 1316     Visit Number 6    Number of Visits 17    Date for PT Re-Evaluation 05/18/24    Authorization Type BC[employer] MCR    Progress Note Due on Visit 10    PT Start Time 1316    PT Stop Time 1357    PT Time Calculation (min) 41 min    Activity Tolerance Patient tolerated treatment well    Behavior During Therapy WFL for tasks assessed/performed          Past Medical History:  Diagnosis Date   Abnormal Pap smear, atypical squamous cells of undetermined sign (ASC-US ) 1998   treated with cryo, CIN I   ADD (attention deficit disorder) 02/23/2012   easily distracted.   Arthritis 02/23/2012   Rt. hip and lt. arm   Closed displaced fracture of fifth metatarsal bone of left foot 03/31/2017   Depression with anxiety    GERD (gastroesophageal reflux disease)    History of kidney stones    History of osteoporosis    PONV (postoperative nausea and vomiting) 02/23/2012   always has PONV   Post-nasal drainage 02/23/2012   frequent a problem,causes a cough and mucus buildup in throat   Urethral polyp 1998   Dr. Duncan   Varicose veins    Venous insufficiency    chronic-denies any problems   Past Surgical History:  Procedure Laterality Date   blood clot removed Left 01/2012   blood clot removed from left hand    BREAST BIOPSY Right 02/14/2012   Fibroadenoma   COLONOSCOPY  08/2003   repeat 5 yrs.   COLONOSCOPY W/ BIOPSIES  12/2008   1 polyp recheck 5 yrs   EXCISION/RELEASE BURSA HIP  02/29/2012   HAND SURGERY  02/23/2012   blood clot excised palm left hand 01-18-12   KNEE ARTHROSCOPY W/ MENISCAL REPAIR  10/2012   KNEE ARTHROSCOPY WITH EXCISION BAKER'S CYST Left 10/2012   meniscal repair   left knee replacement      NASAL SINUS SURGERY     ORIF METATARSAL FRACTURE Left  04/07/2017   Left 5th Metatarsal   scar tissue removed left knee      SHOULDER SURGERY Right 02/2015   Dr. 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   History of total knee arthroplasty, left 12/20/2023   Tremor of left hand 08/23/2023   Pelvic pressure in female 07/14/2023   Proteinuria 07/14/2023   Pelvic pain 07/14/2023   Irritant contact dermatitis 02/20/2023   IBS (irritable bowel syndrome with diarrhea) 08/17/2022   Hearing aid worn 02/10/2022   Trochanteric bursitis of left hip 02/06/2022   Frequent falls 11/26/2020   Depression with anxiety    History of osteoporosis    Age-related osteoporosis without current pathological fracture 07/29/2020   Osteoporosis 07/27/2020   Rhinitis 03/18/2020   Pain in left knee 09/07/2018   Seborrheic keratosis 11/23/2017   S/P TKR (  total knee replacement), left 08/25/2016   Benign paroxysmal positional vertigo 08/25/2016   Mid back pain, chronic 12/16/2015   Cervical strain 11/21/2014   DDD (degenerative disc disease), lumbar 02/17/2014   Vitamin D  deficiency 03/18/2013   Trochanteric bursitis of right hip 02/29/2012   INSOMNIA 04/13/2010   Hyperlipidemia 09/17/2007   CERVICAL POLYP 09/17/2007   Attention deficit disorder 09/11/2007   UNSPECIFIED VENOUS INSUFFICIENCY 08/29/2007    PCP: Alvan Dorothyann BIRCH, MD  REFERRING PROVIDER:   Reena Roxie CROME, PA; Alvan Dorothyann BIRCH, MD    REFERRING DIAG: [phone] (ICD-10-CM) - Other instability, right knee; M54.50 (ICD-10-CM) - Acute bilateral low back pain without sciatica    THERAPY DIAG:  Other symptoms and  signs involving the musculoskeletal system  Chronic bilateral low back pain without sciatica  Rationale for Evaluation and Treatment: Rehabilitation  ONSET DATE: April 2025     LBP 03/28/24  SUBJECTIVE:   SUBJECTIVE STATEMENT: Patient reports the back is hurting more today because she has been having to carry and care for her sick dog. The knee is doing ok.    PERTINENT HISTORY: History of LBP for 10 years  Multiple Lt knee surgeries- most recent total knee revision 12/20/23 Osteoporosis Venous insufficiency   Patient reports the Rt knee is getting better since initial onset in April. It was popping a lot and felt like the knee cap was moving out of place. Every now and then it will still pop and shift (at least 5 times daily). It hurts when it pops and shifts and she will sometimes have to push the knee cap back in. She reports this has been happening to the Rt knee since her Lt knee surgery in April. Has also noticed some popping in the Lt knee recently as well. Does not feel like the knee gives away when walking. Also notes Lt buttock pain that began Saturday.  PAIN:  Are you having pain? Yes: NPRS scale: 5 Pain location: low/mid back Pain description: aching Aggravating factors:  sitting, lifting/carrying Relieving factors: rest; heat; pool    PRECAUTIONS: Fall  RED FLAGS: None   WEIGHT BEARING RESTRICTIONS: No  FALLS:  Has patient fallen in last 6 months? Yes. 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 I don't want my knees to hurt.   NEXT MD VISIT: 04/23/24  OBJECTIVE:  Note: Objective measures were completed at Evaluation unless otherwise noted.  DIAGNOSTIC FINDINGS: none  on file   PATIENT SURVEYS:  LEFS: 39/80  PSFS:  Getting up in the morning 9 Standing from sitting > 1 hour 5 Going up steps 4 18/3 = 6  COGNITION: Overall cognitive status: Within functional limits for tasks assessed     SENSATION: Not tested   MUSCLE LENGTH: Hamstrings: Rt WNL  POSTURE: mild genu valgum, foot inversion   PALPATION: No palpable tenderness   04/03/24: muscular tightness R > L lumbar paraspinals; R posterior hip through gluts and piriformis; bilat hip flexors  Pain with PA mobs lower lumbar spine   04/03/24: LUMBAR ROM:   Active 04/03/24  A/PROM  eval  Flexion 70% LBP  Extension 60% min LBP  Right lateral flexion 80%  Left lateral flexion 80%   Right rotation 30%  Left rotation 30%   (Blank rows = not tested)    LOWER EXTREMITY ROM: 04/03/24: hip ROM - WFLs   Active ROM Right eval Left eval  Hip flexion    Hip extension    Hip abduction    Hip adduction    Hip internal rotation    Hip external rotation    Knee flexion WNL   Knee extension WNL   Ankle dorsiflexion    Ankle plantarflexion    Ankle inversion    Ankle eversion     (Blank rows = not tested)  LOWER EXTREMITY MMT:  MMT Right eval Right  04/03/24 Left eval Left  04/03/24  Hip flexion 4- 4 4 4   Hip extension 4- 4- 4- 4  Hip abduction 4- 3+painful LB 4- 4  Hip adduction      Hip internal rotation      Hip external rotation      Knee flexion 5  4+   Knee extension 5  4+   Ankle dorsiflexion      Ankle plantarflexion      Ankle inversion      Ankle eversion       (Blank rows = not tested)  LOWER EXTREMITY SPECIAL TESTS:  Knee special tests: Patellafemoral apprehension test: negative  FUNCTIONAL TESTS:  SLS: LLE: 3 seconds, RLE: 2 seconds  Stair negotiation- turns sideways to go down 4 inch step; UE support   04/03/24: five times sit to stand: 19.15 sce using hands on upper thighs knees dropping in; plop down on descent   GAIT: Distance walked: 20 ft  Assistive  device utilized: None Level of assistance: Complete Independence Comments: Trendelenburg, limited knee/hip flexion  [facility] Adult PT Treatment:                                                DATE: 04/10/24 Therapeutic Exercise: Physioball rollout x 2 minutes  LTR with figure 4 x 1 minute  Manual Therapy: STM bilateral thoracolumbar paraspinals/QL  Therapeutic Activity: Seated hip hinge with dowel 2 x 10; heavy cues   Self Care: Discussed bending/lifting mechanics, posture, sleep positioning, transfer techniques with handout provided.   [facility] Adult PT Treatment:                                                DATE: 04/08/2024 Therapeutic Exercise: Self-massage piriformis with tennis ball  Seated figure 4 stretch (Lt) DKTC --> bothered back of Lt knee Seated forward flexion with red PB Paloff press in staggered stance + blue TB LTR S/L open books Seated forward trunk flexion stretch Neuromuscular re-ed: Supine --> sitting diaphragmatic breathing Seated pelvic tilts on dyno disc Seated on dyno disc + slow foot hover for abdominal bracing Unilateral 90/90 leg + opp hand bracing yoga block for PF activation Seated low march for abdominal bracing    [facility] Adult PT Treatment:                                                DATE: 04/03/24 Therapeutic Exercise: Prone press up x 10  Glut set  prone 10 sec x 10  Therapeutic Activity: Core activation 10 sec x 1 0 Bridge 5 sec x 10  Diaphragmatic breathing  Shoulder flexion red TB btn hands x 10  Supine marching x 10 R/L  Self Care: Avoid sitting with legs crossed  Avoid sitting with weight shifted toward one hip     [facility] Adult PT Treatment:                                                DATE: 04/01/24 Therapeutic Exercise: Prone quad stretch x 1 min each  Prone HS curl 2 x 10, green band  HEP review Neuromuscular re-ed: Prone hip extension 2 x 10  Sidelying hip abduction 2 x 10  Hooklying hip abduction blue band 2 x 10   Self  Care: F/u with PCP regarding acute back pain                                                                                                                            PATIENT EDUCATION:  Education details: see treatment Person educated: Patient Education method: Explanation, demo, handout Education comprehension: verbalized understanding  HOME EXERCISE PROGRAM: Access Code: [email or web address] URL: [email or web address] Date: 04/08/2024 Prepared by: Lamarr Price  Exercises - Supine Active Straight Leg Raise  - 1 x daily - 7 x weekly - 2 sets - 10 reps - Sidelying Hip Abduction  - 1 x daily - 7 x weekly - 2 sets - 10 reps - Seated Knee Extension with Resistance  - 1 x daily - 7 x weekly - 2 sets - 10 reps - Tandem Stance  - 1 x daily - 7 x weekly - 3 sets - max  hold - Heel Raises with Counter Support  - 1 x daily - 7 x weekly - 2 sets - 10 reps  Back: - Prone Press Up  - 2 x daily - 7 x weekly - 1 sets - 10 reps - 2-3 sec  hold - Prone Gluteal Sets  - 2 x daily - 7 x weekly - 1 sets - 10 reps - 10 sec  hold - Supine Transversus Abdominis Bracing - Hands on Stomach  - 2 x daily - 7 x weekly - 1 sets - 10 reps - 10 sec  hold - Supine Bridge  - 2 x daily - 7 x weekly - 1-2 sets - 10 reps - 5-10 sec  hold - Supine Shoulder Flexion Extension AAROM with Dowel  - 1 x daily - 7 x weekly - 1-2 sets - 10 reps - 3 sec  hold - Supine Lower Trunk Rotation  - 1 x daily - 7 x weekly - 1 sets - 10 reps - Sidelying Open Book Thoracic Lumbar Rotation and Extension  - 1 x daily - 7 x weekly - 1 sets -  10 reps - Seated Flexion Stretch  - 1 x daily - 7 x weekly - 1 sets - 10 reps  ASSESSMENT:  CLINICAL IMPRESSION: Patient's chief complaint remains back pain, so we focused on addressing this issue. She responded well to manual work to back musculature reporting reduction in pain. Time spent discussing proper bending/lifting mechanics with handout provided. We began introduction of lifting  mechanics with patient challenged in completing proper seated hip hinge as she has tendency to compensate with lumbar flexion.    OBJECTIVE IMPAIRMENTS: Abnormal gait, decreased activity tolerance, decreased balance, decreased endurance, decreased knowledge of condition, difficulty walking, decreased strength, impaired flexibility, improper body mechanics, postural dysfunction, and pain.      GOALS: Goals reviewed with patient? Yes  SHORT TERM GOALS: Target date: 04/15/2024  Patient will be independent and compliant with initial HEP.  Baseline: issued at eval  Goal status: MET  2.  Patient will maintain SLS for at least 5 seconds to improve gait stability.  Baseline: see above Goal status: INITIAL  3.  Patient will report </= 2 instances daily of Rt knee popping indicative of improvement in current condition.  Baseline: > 5  04/10/24: maybe 3 Goal status: progressing    LONG TERM GOALS: Target date: 05/18/24  Patient will score >/= 50/80 on the LEFS (MCID is 9) to signify clinically meaningful improvement in functional abilities.  Baseline: see above Goal status: INITIAL  2.  Patient will demonstrate at least 4+/5 hip abductor strength to improve stability about the chain with prolonged walking activity.  Baseline: see above Goal status: INITIAL  3.  Patient will be able to complete step down from 4 inch step forward facing without UE support to improve curb negotiation.  Baseline: side step, UE support  Goal status: INITIAL  4.  Patient will demonstrate at least 4+/5 hip flexor strength to improve ability to negotiate stairs.  Baseline: see above Goal status: INITIAL  5.  Patient will be independent with advanced home program to progress/maintain current level of function.  Baseline: initial HEP issued  Goal status: INITIAL  6. Patient reports decreased pain when getting out of bed in the morning to no more than 1-2/10   Baseline:   Goal status: NEW 04/03/24  7.  Patient reports ability to sit for > 1 hour without increased pain when standing   Baseline: pain with standing after sitting < 1 hour   Goal Status: NEW 04/03/24  8. Patient reports ability to ascend and descend 4 or more steps with minimal LBP increase of no more than 1-2/10    Baseline: pain with all stairs   Goal status: NEW 04/03/24    PLAN:  PT FREQUENCY: 2x/week  PT DURATION: 8 weeks  PLANNED INTERVENTIONS: [id number]- PT Re-evaluation, 97750- Physical Performance Testing, 97110-Therapeutic exercises, 97530- Therapeutic activity, [id number]- Neuromuscular re-education, 97535- Self Care, [id number]- Manual therapy, [id number]- Gait training, [id number]- Aquatic Therapy, [phone] (1-2 muscles), [id number] (3+ muscles)- Dry Needling, Cryotherapy, and Moist heat  PLAN FOR NEXT SESSION: hip strengthening, static balance, quad eccentric. Progress with core stabilization and strengthening for LBP as patient tolerates; FLOOR TRANSFER. Progress lifting mechanics. Be mindful of patient's osteoporosis.   Anyla Israelson, PT, DPT, ATC 04/10/24 1:59 PM

## 2024-04-10 NOTE — Patient Instructions (Signed)

## 2024-04-11 ENCOUNTER — Telehealth: Payer: Self-pay

## 2024-04-11 NOTE — Progress Notes (Signed)
   04/11/2024  Patient ID: Wanda Gonzales, female   DOB: [birth year], 68 y.o.   MRN: [id number]  This patient is appearing on a report for being at risk of failing the adherence measure for cholesterol (statin) medications this calendar year.   Medication: rosuvastatin  10mg  Last fill date: 02/19/24 for 90 day supply  Insurance report was not up to date. No action needed at this time.   Channing DELENA Mealing, PharmD, DPLA

## 2024-04-17 ENCOUNTER — Ambulatory Visit

## 2024-04-17 DIAGNOSIS — M545 Low back pain, unspecified: Secondary | ICD-10-CM | POA: Diagnosis not present

## 2024-04-17 DIAGNOSIS — M25561 Pain in right knee: Secondary | ICD-10-CM

## 2024-04-17 DIAGNOSIS — G8929 Other chronic pain: Secondary | ICD-10-CM | POA: Diagnosis not present

## 2024-04-17 DIAGNOSIS — R29898 Other symptoms and signs involving the musculoskeletal system: Secondary | ICD-10-CM

## 2024-04-17 DIAGNOSIS — R262 Difficulty in walking, not elsewhere classified: Secondary | ICD-10-CM

## 2024-04-17 DIAGNOSIS — M6281 Muscle weakness (generalized): Secondary | ICD-10-CM

## 2024-04-17 DIAGNOSIS — R2689 Other abnormalities of gait and mobility: Secondary | ICD-10-CM

## 2024-04-17 NOTE — Therapy (Signed)
 OUTPATIENT PHYSICAL THERAPY LOWER EXTREMITY TREATMENT   Patient Name: Wanda Gonzales MRN: [id number] DOB:01/16/1956, 68 y.o., female Today's Date: 04/17/2024  END OF SESSION:  PT End of Session - 04/17/24 0933     Visit Number 7    Number of Visits 17    Date for PT Re-Evaluation 05/18/24    Authorization Type BC[employer] MCR    Progress Note Due on Visit 10    PT Start Time 0933    PT Stop Time 1016    PT Time Calculation (min) 43 min    Activity Tolerance Patient tolerated treatment well    Behavior During Therapy WFL for tasks assessed/performed         Past Medical History:  Diagnosis Date   Abnormal Pap smear, atypical squamous cells of undetermined sign (ASC-US ) 1998   treated with cryo, CIN I   ADD (attention deficit disorder) 02/23/2012   easily distracted.   Arthritis 02/23/2012   Rt. hip and lt. arm   Closed displaced fracture of fifth metatarsal bone of left foot 03/31/2017   Depression with anxiety    GERD (gastroesophageal reflux disease)    History of kidney stones    History of osteoporosis    PONV (postoperative nausea and vomiting) 02/23/2012   always has PONV   Post-nasal drainage 02/23/2012   frequent a problem,causes a cough and mucus buildup in throat   Urethral polyp 1998   Dr. Duncan   Varicose veins    Venous insufficiency    chronic-denies any problems   Past Surgical History:  Procedure Laterality Date   blood clot removed Left 01/2012   blood clot removed from left hand    BREAST BIOPSY Right 02/14/2012   Fibroadenoma   COLONOSCOPY  08/2003   repeat 5 yrs.   COLONOSCOPY W/ BIOPSIES  12/2008   1 polyp recheck 5 yrs   EXCISION/RELEASE BURSA HIP  02/29/2012   HAND SURGERY  02/23/2012   blood clot excised palm left hand 01-18-12   KNEE ARTHROSCOPY W/ MENISCAL REPAIR  10/2012   KNEE ARTHROSCOPY WITH EXCISION BAKER'S CYST Left 10/2012   meniscal repair   left knee replacement      NASAL SINUS SURGERY     ORIF METATARSAL FRACTURE Left  04/07/2017   Left 5th Metatarsal   scar tissue removed left knee      SHOULDER SURGERY Right 02/2015   Dr. 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   History of total knee arthroplasty, left 12/20/2023   Tremor of left hand 08/23/2023   Pelvic pressure in female 07/14/2023   Proteinuria 07/14/2023   Pelvic pain 07/14/2023   Irritant contact dermatitis 02/20/2023   IBS (irritable bowel syndrome with diarrhea) 08/17/2022   Hearing aid worn 02/10/2022   Trochanteric bursitis of left hip 02/06/2022   Frequent falls 11/26/2020   Depression with anxiety    History of osteoporosis    Age-related osteoporosis without current pathological fracture 07/29/2020   Osteoporosis 07/27/2020   Rhinitis 03/18/2020   Pain in left knee 09/07/2018   Seborrheic keratosis 11/23/2017   S/P TKR (total  knee replacement), left 08/25/2016   Benign paroxysmal positional vertigo 08/25/2016   Mid back pain, chronic 12/16/2015   Cervical strain 11/21/2014   DDD (degenerative disc disease), lumbar 02/17/2014   Vitamin D  deficiency 03/18/2013   Trochanteric bursitis of right hip 02/29/2012   INSOMNIA 04/13/2010   Hyperlipidemia 09/17/2007   CERVICAL POLYP 09/17/2007   Attention deficit disorder 09/11/2007   UNSPECIFIED VENOUS INSUFFICIENCY 08/29/2007    PCP: Alvan Dorothyann BIRCH, MD  REFERRING PROVIDER:   Reena Roxie CROME, PA; Alvan Dorothyann BIRCH, MD    REFERRING DIAG: [phone] (ICD-10-CM) - Other instability, right knee; M54.50 (ICD-10-CM) - Acute bilateral low back pain without sciatica    THERAPY DIAG:  Other symptoms and  signs involving the musculoskeletal system  Chronic bilateral low back pain without sciatica  Acute pain of right knee  Muscle weakness (generalized)  Other abnormalities of gait and mobility  Difficulty in walking, not elsewhere classified  Rationale for Evaluation and Treatment: Rehabilitation  ONSET DATE: April 2025     LBP 03/28/24  SUBJECTIVE:   SUBJECTIVE STATEMENT: Patient reports her back is feeling better, 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 Patient states she has been taking more breaks when back discomfort increases, is also using heat to low back.    PERTINENT HISTORY: History of LBP for 10 years  Multiple Lt knee surgeries- most recent total knee revision 12/20/23 Osteoporosis Venous insufficiency   Patient reports the Rt knee is getting better since initial onset in April. It was popping a lot and felt like the knee cap was moving out of place. Every now and then it will still pop and shift (at least 5 times daily). It hurts when it pops and shifts and she will sometimes have to push the knee cap back in. She reports this has been happening to the Rt knee since her Lt knee surgery in April. Has also noticed some popping in the Lt knee recently as well. Does not feel like the knee gives away when walking. Also notes Lt buttock pain that began Saturday.  PAIN:  Are you having pain? Yes: NPRS scale: 5 Pain location: low/mid back Pain description: aching Aggravating factors:  sitting, lifting/carrying Relieving factors: rest; heat; pool    PRECAUTIONS: Fall  RED FLAGS: None   WEIGHT BEARING RESTRICTIONS: No  FALLS:  Has patient fallen in last 6 months? Yes. Number of falls 2-3 was falling prior to Lt knee surgery  LIVING ENVIRONMENT: Lives with: lives with their spouse Lives in: House/apartment Stairs: No Has following equipment at home: Retail banker - 2 wheeled  OCCUPATION: part-time Psychologist, prison and probation services   PLOF: Independent  PATIENT GOALS: manage the Rt knee independently. I don't want my knees to hurt.   NEXT MD VISIT: 04/23/24  OBJECTIVE:  Note: Objective measures were completed at Evaluation unless otherwise noted.  DIAGNOSTIC FINDINGS: none on file   PATIENT SURVEYS:  LEFS: 39/80  PSFS:  Getting up in the morning 9 Standing from sitting > 1 hour 5 Going up steps 4 18/3 = 6  COGNITION: Overall cognitive status: Within functional limits for tasks assessed     SENSATION: Not tested   MUSCLE LENGTH: Hamstrings: Rt WNL  POSTURE: mild genu valgum, foot inversion   PALPATION: No palpable tenderness   04/03/24: muscular tightness R > L lumbar paraspinals; R posterior hip through gluts and piriformis; bilat hip flexors  Pain with PA mobs lower lumbar spine  04/03/24: LUMBAR ROM:   Active 04/03/24  A/PROM  eval  Flexion 70% LBP  Extension 60% min LBP  Right lateral flexion 80%  Left lateral flexion 80%   Right rotation 30%  Left rotation 30%   (Blank rows = not tested)    LOWER EXTREMITY ROM: 04/03/24: hip ROM - WFLs   Active ROM Right eval Left eval  Hip flexion    Hip extension    Hip abduction    Hip adduction    Hip internal rotation    Hip external rotation    Knee flexion WNL   Knee extension WNL   Ankle dorsiflexion    Ankle plantarflexion    Ankle inversion    Ankle eversion     (Blank rows = not tested)  LOWER EXTREMITY MMT:  MMT Right eval Right  04/03/24 Left eval Left  04/03/24  Hip flexion 4- 4 4 4   Hip extension 4- 4- 4- 4  Hip abduction 4- 3+painful LB 4- 4  Hip adduction      Hip internal rotation      Hip external rotation      Knee flexion 5  4+   Knee extension 5  4+   Ankle dorsiflexion      Ankle plantarflexion      Ankle inversion      Ankle eversion       (Blank rows = not tested)  LOWER EXTREMITY SPECIAL TESTS:  Knee special tests: Patellafemoral apprehension test: negative  FUNCTIONAL TESTS:  SLS:  LLE: 3 seconds, RLE: 2 seconds  Stair negotiation- turns sideways to go down 4 inch step; UE support   04/03/24: five times sit to stand: 19.15 sce using hands on upper thighs knees dropping in; plop down on descent   GAIT: Distance walked: 20 ft  Assistive device utilized: None Level of assistance: Complete Independence Comments: Trendelenburg, limited knee/hip flexion  [facility] Adult PT Treatment:                                                DATE: 04/17/2024 Neuromuscular re-ed: PF activation + anterior --> posterior pelvic tilt  Diaphragmatic breathing --> side lying & sitting Posterior PT with TA & PF activation Therapeutic Activity: Standing hip hinge with dowel Squat with hip hinge Attempted squat lift with 5#KB --> unable to maintain flat back   [facility] Adult PT Treatment:                                                DATE: 04/10/24 Therapeutic Exercise: Physioball rollout x 2 minutes  LTR with figure 4 x 1 minute  Manual Therapy: STM bilateral thoracolumbar paraspinals/QL Therapeutic Activity: Seated hip hinge with dowel 2 x 10; heavy cues  Self Care: Discussed bending/lifting mechanics, posture, sleep positioning, transfer techniques with handout provided.    [facility] Adult PT Treatment:                                                DATE: 04/08/2024 Therapeutic Exercise: Self-massage piriformis with tennis ball  Seated figure 4 stretch (Lt) DKTC -->  bothered back of Lt knee Seated forward flexion with red PB Paloff press in staggered stance + blue TB LTR S/L open books Seated forward trunk flexion stretch Neuromuscular re-ed: Supine --> sitting diaphragmatic breathing Seated pelvic tilts on dyno disc Seated on dyno disc + slow foot hover for abdominal bracing Unilateral 90/90 leg + opp hand bracing yoga block for PF activation Seated low march for abdominal bracing                                                                                                                            PATIENT EDUCATION:  Education details: see treatment Person educated: Patient Education method: Explanation, demo, handout Education comprehension: verbalized understanding  HOME EXERCISE PROGRAM: Access Code: [email or web address] URL: [email or web address] Date: 04/17/2024 Prepared by: Lamarr Price  Exercises - Supine Active Straight Leg Raise  - 1 x daily - 7 x weekly - 2 sets - 10 reps - Sidelying Hip Abduction  - 1 x daily - 7 x weekly - 2 sets - 10 reps - Seated Knee Extension with Resistance  - 1 x daily - 7 x weekly - 2 sets - 10 reps - Tandem Stance  - 1 x daily - 7 x weekly - 3 sets - max  hold - Heel Raises with Counter Support  - 1 x daily - 7 x weekly - 2 sets - 10 reps - Prone Press Up  - 2 x daily - 7 x weekly - 1 sets - 10 reps - 2-3 sec  hold - Prone Gluteal Sets  - 2 x daily - 7 x weekly - 1 sets - 10 reps - 10 sec  hold - Supine Transversus Abdominis Bracing - Hands on Stomach  - 2 x daily - 7 x weekly - 1 sets - 10 reps - 10 sec  hold - Supine Bridge  - 2 x daily - 7 x weekly - 1-2 sets - 10 reps - 5-10 sec  hold - Supine Shoulder Flexion Extension AAROM with Dowel  - 1 x daily - 7 x weekly - 1-2 sets - 10 reps - 3 sec  hold - Supine Lower Trunk Rotation  - 1 x daily - 7 x weekly - 1 sets - 10 reps - Sidelying Open Book Thoracic Lumbar Rotation and Extension  - 1 x daily - 7 x weekly - 1 sets - 10 reps - Seated Flexion Stretch  - 1 x daily - 7 x weekly - 1 sets - 10 reps - Standing Hip Hinge with Dowel  - 1 x daily - 7 x weekly - 1 sets - 10 reps - Squat with Chair Touch  - 1 x daily - 7 x weekly - 1 sets - 10 reps  ASSESSMENT:  CLINICAL IMPRESSION: Instructed patient in various diaphragmatic breathing exercises in sitting and side lying; patient demonstrated restricted lateral ribcage mobility and had difficulty deepening breath into 360 motion, exhibiting a significant amount of postural tension  in shoulders and chest. Patient had difficulty  maintaining hip hinge mechanics and safe postural alignment for osteoporosis precaution with lifting activity. Discussion with patient on importance of land strengthening exercises to build functional strength.   OBJECTIVE IMPAIRMENTS: Abnormal gait, decreased activity tolerance, decreased balance, decreased endurance, decreased knowledge of condition, difficulty walking, decreased strength, impaired flexibility, improper body mechanics, postural dysfunction, and pain.      GOALS: Goals reviewed with patient? Yes  SHORT TERM GOALS: Target date: 04/15/2024  Patient will be independent and compliant with initial HEP.  Baseline: issued at eval  Goal status: MET  2.  Patient will maintain SLS for at least 5 seconds to improve gait stability.  Baseline: see above Goal status: INITIAL  3.  Patient will report </= 2 instances daily of Rt knee popping indicative of improvement in current condition.  Baseline: > 5  04/10/24: maybe 3 Goal status: progressing    LONG TERM GOALS: Target date: 05/18/24  Patient will score >/= 50/80 on the LEFS (MCID is 9) to signify clinically meaningful improvement in functional abilities.  Baseline: see above Goal status: INITIAL  2.  Patient will demonstrate at least 4+/5 hip abductor strength to improve stability about the chain with prolonged walking activity.  Baseline: see above Goal status: INITIAL  3.  Patient will be able to complete step down from 4 inch step forward facing without UE support to improve curb negotiation.  Baseline: side step, UE support  Goal status: INITIAL  4.  Patient will demonstrate at least 4+/5 hip flexor strength to improve ability to negotiate stairs.  Baseline: see above Goal status: INITIAL  5.  Patient will be independent with advanced home program to progress/maintain current level of function.  Baseline: initial HEP issued  Goal status: INITIAL  6. Patient reports decreased pain when getting out of bed in  the morning to no more than 1-2/10   Baseline:   Goal status: NEW 04/03/24  7. Patient reports ability to sit for > 1 hour without increased pain when standing   Baseline: pain with standing after sitting < 1 hour   Goal Status: NEW 04/03/24  8. Patient reports ability to ascend and descend 4 or more steps with minimal LBP increase of no more than 1-2/10    Baseline: pain with all stairs   Goal status: NEW 04/03/24    PLAN:  PT FREQUENCY: 2x/week  PT DURATION: 8 weeks  PLANNED INTERVENTIONS: 97164- PT Re-evaluation, 97750- Physical Performance Testing, 97110-Therapeutic exercises, 97530- Therapeutic activity, [id number]- Neuromuscular re-education, 97535- Self Care, [id number]- Manual therapy, [id number]- Gait training, [id number]- Aquatic Therapy, [phone] (1-2 muscles), 20561 (3+ muscles)- Dry Needling, Cryotherapy, and Moist heat  PLAN FOR NEXT SESSION: Review and edit down HEP. hip strengthening, static balance, quad eccentric. Progress with core stabilization and strengthening for LBP as patient tolerates; FLOOR TRANSFER. Progress lifting mechanics. Be mindful of patient's osteoporosis.   Lamarr Price, PTA 04/17/24 10:17 AM

## 2024-04-18 ENCOUNTER — Encounter

## 2024-04-22 ENCOUNTER — Ambulatory Visit: Attending: Student | Admitting: Rehabilitative and Restorative Service Providers"

## 2024-04-24 ENCOUNTER — Ambulatory Visit (INDEPENDENT_AMBULATORY_CARE_PROVIDER_SITE_OTHER): Admitting: Urgent Care

## 2024-04-24 VITALS — BP 95/61 | HR 89 | Resp 15 | Ht 61.0 in

## 2024-04-24 DIAGNOSIS — J069 Acute upper respiratory infection, unspecified: Secondary | ICD-10-CM

## 2024-04-24 DIAGNOSIS — M25361 Other instability, right knee: Secondary | ICD-10-CM | POA: Diagnosis not present

## 2024-04-24 DIAGNOSIS — Z96652 Presence of left artificial knee joint: Secondary | ICD-10-CM | POA: Diagnosis not present

## 2024-04-24 MED ORDER — PREDNISONE 20 MG PO TABS: 20.0000 mg | ORAL_TABLET | Freq: Every day | ORAL | 0 refills | Status: AC

## 2024-04-24 MED ORDER — AMOXICILLIN-POT CLAVULANATE 875-125 MG PO TABS: 1.0000 | ORAL_TABLET | Freq: Two times a day (BID) | ORAL | 0 refills | Status: AC

## 2024-04-24 NOTE — Patient Instructions (Addendum)
 Please start taking the antibiotic, Augmentin , twice daily with food. Take it for all 10 days, do not stop early just because you feel better. Take an over the counter probiotic or yogurt daily to help prevent diarrhea/ yeast infection.  Start taking prednisone  once daily with breakfast in the morning to help clear up the mucous.  It is also recommended that you use nasal saline/ sinus washes to cleans the sinus passages.  Salt water gargles may be beneficial.

## 2024-04-24 NOTE — Progress Notes (Unsigned)
   Established Patient Office Visit  Subjective:  Patient ID: Wanda Gonzales, female    DOB: Apr 05, 1956  Age: 68 y.o. MRN: [id number]  Chief Complaint  Patient presents with   Sore Throat    Weak since sat.     HPI  {History (Optional):23778}  ROS: as noted in HPI  Objective:     BP 95/61 (BP Location: Left Arm, Patient Position: Sitting, Cuff Size: Normal)   Pulse 89   Resp 15   Ht 5' 1 (1.549 m)   LMP 12/18/2013   SpO2 100%   BMI 30.41 kg/m  BP Readings from Last 3 Encounters:  04/24/24 95/61  04/02/24 114/70  02/26/24 (!) 85/64   Wt Readings from Last 3 Encounters:  12/20/23 160 lb 15 oz (73 kg)  12/06/23 162 lb (73.5 kg)  08/17/22 144 lb (65.3 kg)      Physical Exam   No results found for any visits on 04/24/24.  {Labs (Optional):23779}  The 10-year ASCVD risk score (Arnett DK, et al., 2019) is: 3.9%  Assessment & Plan:  Upper respiratory tract infection, unspecified type     No follow-ups on file.   Benton LITTIE Gave, PA

## 2024-04-25 ENCOUNTER — Encounter: Payer: Self-pay | Admitting: Urgent Care

## 2024-04-25 ENCOUNTER — Ambulatory Visit (HOSPITAL_BASED_OUTPATIENT_CLINIC_OR_DEPARTMENT_OTHER): Admitting: Physical Therapy

## 2024-04-29 ENCOUNTER — Encounter: Payer: Self-pay | Admitting: Rehabilitative and Restorative Service Providers"

## 2024-04-29 ENCOUNTER — Ambulatory Visit: Attending: Student | Admitting: Rehabilitative and Restorative Service Providers"

## 2024-04-29 DIAGNOSIS — M6281 Muscle weakness (generalized): Secondary | ICD-10-CM | POA: Diagnosis not present

## 2024-04-29 DIAGNOSIS — M25561 Pain in right knee: Secondary | ICD-10-CM | POA: Diagnosis not present

## 2024-04-29 DIAGNOSIS — R29898 Other symptoms and signs involving the musculoskeletal system: Secondary | ICD-10-CM | POA: Insufficient documentation

## 2024-04-29 DIAGNOSIS — G8929 Other chronic pain: Secondary | ICD-10-CM | POA: Insufficient documentation

## 2024-04-29 DIAGNOSIS — M545 Low back pain, unspecified: Secondary | ICD-10-CM | POA: Diagnosis not present

## 2024-04-29 NOTE — Therapy (Signed)
 OUTPATIENT PHYSICAL THERAPY LOWER EXTREMITY TREATMENT Progress Note Reporting Period 03/18/24 to 04/29/24  See note below for Objective Data and Assessment of Progress/Goals.       Patient Name: Wanda Gonzales MRN: [id number] DOB:Feb 06, 1956, 68 y.o., female Today's Date: 04/29/2024  END OF SESSION:  PT End of Session - 04/29/24 1537     Visit Number 8    Number of Visits 17    Date for PT Re-Evaluation 05/18/24    Authorization Type [employer] MCR    Authorization - Visit Number 8    Progress Note Due on Visit 18    PT Start Time 1535    PT Stop Time 1620    PT Time Calculation (min) 45 min    Activity Tolerance Patient tolerated treatment well         Past Medical History:  Diagnosis Date   Abnormal Pap smear, atypical squamous cells of undetermined sign (ASC-US ) 1998   treated with cryo, CIN I   ADD (attention deficit disorder) 02/23/2012   easily distracted.   Arthritis 02/23/2012   Rt. hip and lt. arm   Closed displaced fracture of fifth metatarsal bone of left foot 03/31/2017   Depression with anxiety    GERD (gastroesophageal reflux disease)    History of kidney stones    History of osteoporosis    PONV (postoperative nausea and vomiting) 02/23/2012   always has PONV   Post-nasal drainage 02/23/2012   frequent a problem,causes a cough and mucus buildup in throat   Urethral polyp 1998   Dr. Duncan   Varicose veins    Venous insufficiency    chronic-denies any problems   Past Surgical History:  Procedure Laterality Date   blood clot removed Left 01/2012   blood clot removed from left hand    BREAST BIOPSY Right 02/14/2012   Fibroadenoma   COLONOSCOPY  08/2003   repeat 5 yrs.   COLONOSCOPY W/ BIOPSIES  12/2008   1 polyp recheck 5 yrs   EXCISION/RELEASE BURSA HIP  02/29/2012   HAND SURGERY  02/23/2012   blood clot excised palm left hand 01-18-12   KNEE ARTHROSCOPY W/ MENISCAL REPAIR  10/2012   KNEE ARTHROSCOPY WITH EXCISION BAKER'S CYST Left 10/2012    meniscal repair   left knee replacement      NASAL SINUS SURGERY     ORIF METATARSAL FRACTURE Left 04/07/2017   Left 5th Metatarsal   scar tissue removed left knee      SHOULDER SURGERY Right 02/2015   Dr. 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   History of total knee arthroplasty, left 12/20/2023   Tremor of left hand 08/23/2023   Pelvic pressure in female 07/14/2023   Proteinuria 07/14/2023   Pelvic pain 07/14/2023   Irritant contact dermatitis 02/20/2023   IBS (irritable bowel syndrome with diarrhea) 08/17/2022   Hearing aid worn 02/10/2022   Trochanteric bursitis of left hip 02/06/2022   Frequent falls 11/26/2020   Depression with anxiety    History of osteoporosis    Age-related osteoporosis without current pathological fracture 07/29/2020   Osteoporosis 07/27/2020  Rhinitis 03/18/2020   Pain in left knee 09/07/2018   Seborrheic keratosis 11/23/2017   S/P TKR (total knee replacement), left 08/25/2016   Benign paroxysmal positional vertigo 08/25/2016   Mid back pain, chronic 12/16/2015   Cervical strain 11/21/2014   DDD (degenerative disc disease), lumbar 02/17/2014   Vitamin D  deficiency 03/18/2013   Trochanteric bursitis of right hip 02/29/2012   INSOMNIA 04/13/2010   Hyperlipidemia 09/17/2007   CERVICAL POLYP 09/17/2007   Attention deficit disorder 09/11/2007   UNSPECIFIED VENOUS INSUFFICIENCY 08/29/2007    PCP: Alvan Dorothyann BIRCH, MD  REFERRING PROVIDER:   Reena Roxie CROME, PA; Alvan Dorothyann BIRCH, MD    REFERRING DIAG: [phone] (ICD-10-CM) - Other instability, right knee; M54.50  (ICD-10-CM) - Acute bilateral low back pain without sciatica    THERAPY DIAG:  Other symptoms and signs involving the musculoskeletal system  Chronic bilateral low back pain without sciatica  Acute pain of right knee  Muscle weakness (generalized)  Rationale for Evaluation and Treatment: Rehabilitation  ONSET DATE: April 2025     LBP 03/28/24  SUBJECTIVE:   SUBJECTIVE STATEMENT: Patient reports she saw Dr. Hiram last week and he was pleased with her progress. Feels the knee is improving and she needs to continue with strengthening to better function. Her back is feeling much better. She is doing her exercises and using heat to low back.    PERTINENT HISTORY: History of LBP for 10 years  Multiple Lt knee surgeries- most recent total knee revision 12/20/23 Osteoporosis Venous insufficiency   Patient reports the Rt knee is getting better since initial onset in April. It was popping a lot and felt like the knee cap was moving out of place. Every now and then it will still pop and shift (at least 5 times daily). It hurts when it pops and shifts and she will sometimes have to push the knee cap back in. She reports this has been happening to the Rt knee since her Lt knee surgery in April. Has also noticed some popping in the Lt knee recently as well. Does not feel like the knee gives away when walking. Also notes Lt buttock pain that began Saturday.  PAIN:  Are you having pain? Yes: NPRS scale: 0/10 Pain location: low/mid back Pain description: aching Aggravating factors:  sitting, lifting/carrying Relieving factors: rest; heat; pool    PRECAUTIONS: Fall  WEIGHT BEARING RESTRICTIONS: No  FALLS:  Has patient fallen in last 6 months? Yes. Number of falls 2-3 was falling prior to Lt knee surgery; 04/29/24 one fall since surgery no injury  LIVING ENVIRONMENT: Lives with: lives with their spouse Lives in: House/apartment Stairs: No Has following equipment at home: Lawyer - 2 wheeled  OCCUPATION: part-time [employer]   PATIENT GOALS: manage the Rt knee independently. I don't want my knees to hurt.   NEXT MD VISIT: 4/26  OBJECTIVE:  Note: Objective measures were completed at Evaluation unless otherwise noted.  DIAGNOSTIC FINDINGS: none on file   PATIENT SURVEYS: EVAL LEFS: 39/80  PSFS:  Getting up in the morning 9 Standing from sitting > 1 hour 5 Going up steps 4 18/3 = 6  PATIENT SURVEYS: 04/29/24 LEFS: 39/80  PSFS:  Getting up in the morning 0 Standing from sitting > 1 hour 0 Going up steps 4 4/3 = 1.33  COGNITION: Overall cognitive status: Within functional limits for tasks assessed     SENSATION: Not tested   MUSCLE LENGTH: Hamstrings: Rt WNL  POSTURE: mild genu valgum, foot inversion   PALPATION: No palpable tenderness   04/03/24: muscular tightness R > L lumbar paraspinals; R posterior hip through gluts and piriformis; bilat hip flexors  Pain with PA mobs lower lumbar spine   04/03/24: LUMBAR ROM: no pain with lumbar ROM  Active 04/03/24  A/PROM  eval AROM 04/29/24  Flexion 70% LBP 85%  Extension 60% min LBP 65%   Right lateral flexion 80%   Left lateral flexion 80%    Right rotation 30%   Left rotation 30%    (Blank rows = not tested)    LOWER EXTREMITY ROM: 04/03/24: hip ROM - WFLs   Active ROM Right eval Left eval  Hip flexion    Hip extension    Hip abduction    Hip adduction    Hip internal rotation    Hip external rotation    Knee flexion WNL   Knee extension WNL   Ankle dorsiflexion    Ankle plantarflexion    Ankle inversion    Ankle eversion     (Blank rows = not tested)  LOWER EXTREMITY MMT:  MMT Right eval Right  04/03/24 Right  04/29/24 Left eval Left  04/03/24 Left  04/29/24  Hip flexion 4- 4 4+ 4 4 4+  Hip extension 4- 4-  4- 4   Hip abduction 4- 3+painful LB 4 4- 4 4+  Hip adduction        Hip internal rotation        Hip external rotation        Knee flexion 5    4+  5  Knee extension 5   4+  5  Ankle dorsiflexion        Ankle plantarflexion        Ankle inversion        Ankle eversion         (Blank rows = not tested)  LOWER EXTREMITY SPECIAL TESTS:  Knee special tests: Patellafemoral apprehension test: negative  FUNCTIONAL TESTS:  SLS: LLE: 3 seconds, RLE: 2 seconds  Stair negotiation- turns sideways to go down 4 inch step; UE support   04/29/24: SLS-  LLE 4 sec with touch of L hand for balance: R LE 10 sec one touch of L hand 5 times sit to stand; 19.15 sec no use of hands, more control in stand to sit - no plop Stairs negotiation - not confident; going slowly step over step with UE support   04/03/24: five times sit to stand: 19.15 sce using hands on upper thighs knees dropping in; plop down on descent    GAIT: Distance walked: 20 ft  Assistive device utilized: None Level of assistance: Complete Independence Comments: Trendelenburg, limited knee/hip flexion  [facility] Adult PT Treatment:                                                DATE: 04/29/2024 Reassessment - ROM and strength measurements Neuromuscular re-ed: PF activation + anterior --> posterior pelvic tilt  Diaphragmatic breathing --> side lying & sitting  Therapeutic Activity: Sit to stand x 5 SLR core engaged x 10  Bridge 3 sec x 10  Alternate hip abduction green TB x 10    [facility] Adult PT Treatment:  DATE: 04/17/2024 Neuromuscular re-ed: PF activation + anterior --> posterior pelvic tilt  Diaphragmatic breathing --> side lying & sitting Posterior PT with TA & PF activation Therapeutic Activity: Standing hip hinge with dowel Squat with hip hinge Attempted squat lift with 5#KB --> unable to maintain flat back   [facility] Adult PT Treatment:                                                DATE: 04/10/24 Therapeutic Exercise: Physioball rollout x 2 minutes  LTR with figure 4 x 1 minute  Manual Therapy: STM bilateral  thoracolumbar paraspinals/QL Therapeutic Activity: Seated hip hinge with dowel 2 x 10; heavy cues  Self Care: Discussed bending/lifting mechanics, posture, sleep positioning, transfer techniques with handout provided.    [facility] Adult PT Treatment:                                                DATE: 04/08/2024 Therapeutic Exercise: Self-massage piriformis with tennis ball  Seated figure 4 stretch (Lt) DKTC --> bothered back of Lt knee Seated forward flexion with red PB Paloff press in staggered stance + blue TB LTR S/L open books Seated forward trunk flexion stretch Neuromuscular re-ed: Supine --> sitting diaphragmatic breathing Seated pelvic tilts on dyno disc Seated on dyno disc + slow foot hover for abdominal bracing Unilateral 90/90 leg + opp hand bracing yoga block for PF activation Seated low march for abdominal bracing                                                                                                                           PATIENT EDUCATION:  Education details: see treatment Person educated: Patient Education method: Explanation, demo, handout Education comprehension: verbalized understanding  HOME EXERCISE PROGRAM: Access Code: [email or web address] URL: [email or web address] Date: 04/17/2024 Prepared by: Lamarr Price  Exercises - Supine Active Straight Leg Raise  - 1 x daily - 7 x weekly - 2 sets - 10 reps - Sidelying Hip Abduction  - 1 x daily - 7 x weekly - 2 sets - 10 reps - Seated Knee Extension with Resistance  - 1 x daily - 7 x weekly - 2 sets - 10 reps - Tandem Stance  - 1 x daily - 7 x weekly - 3 sets - max  hold - Heel Raises with Counter Support  - 1 x daily - 7 x weekly - 2 sets - 10 reps - Prone Press Up  - 2 x daily - 7 x weekly - 1 sets - 10 reps - 2-3 sec  hold - Prone Gluteal Sets  - 2 x daily - 7 x weekly - 1 sets - 10 reps - 10 sec  hold - Supine Transversus Abdominis Bracing - Hands on Stomach  - 2 x daily - 7 x weekly - 1 sets  - 10 reps - 10 sec  hold - Supine Bridge  - 2 x daily - 7 x weekly - 1-2 sets - 10 reps - 5-10 sec  hold - Supine Shoulder Flexion Extension AAROM with Dowel  - 1 x daily - 7 x weekly - 1-2 sets - 10 reps - 3 sec  hold - Supine Lower Trunk Rotation  - 1 x daily - 7 x weekly - 1 sets - 10 reps - Sidelying Open Book Thoracic Lumbar Rotation and Extension  - 1 x daily - 7 x weekly - 1 sets - 10 reps - Seated Flexion Stretch  - 1 x daily - 7 x weekly - 1 sets - 10 reps - Standing Hip Hinge with Dowel  - 1 x daily - 7 x weekly - 1 sets - 10 reps - Squat with Chair Touch  - 1 x daily - 7 x weekly - 1 sets - 10 reps  ASSESSMENT:  CLINICAL IMPRESSION: Instructed patient in various diaphragmatic breathing exercises in sitting and side lying; patient demonstrated restricted lateral ribcage mobility and had difficulty deepening breath into 360 motion, exhibiting a significant amount of postural tension in shoulders and chest. Patient had difficulty maintaining hip hinge mechanics and safe postural alignment for osteoporosis precaution with lifting activity. Discussion with patient on importance of land strengthening exercises to build functional strength.   OBJECTIVE IMPAIRMENTS: Abnormal gait, decreased activity tolerance, decreased balance, decreased endurance, decreased knowledge of condition, difficulty walking, decreased strength, impaired flexibility, improper body mechanics, postural dysfunction, and pain.      GOALS: Goals reviewed with patient? Yes  SHORT TERM GOALS: Target date: 04/15/2024  Patient will be independent and compliant with initial HEP.  Baseline: issued at eval  Goal status: MET  2.  Patient will maintain SLS for at least 5 seconds to improve gait stability.  Baseline: see above 04/29/24: 10 sec R; 4 sec with hand touch for balance L  Goal status: partially accomplished   3.  Patient will report </= 2 instances daily of Rt knee popping indicative of improvement in current  condition.  Baseline: > 5  04/10/24: maybe 3 04/29/24: 0-1 maybe Goal status: met   LONG TERM GOALS: Target date: 05/18/24  Patient will score >/= 50/80 on the LEFS (MCID is 9) to signify clinically meaningful improvement in functional abilities.  Baseline: see above 04/29/24: 1.33 Goal status: met  2.  Patient will demonstrate at least 4+/5 hip abductor strength to improve stability about the chain with prolonged walking activity.  Baseline: see above Goal status: on going  3.  Patient will be able to complete step down from 4 inch step forward facing without UE support to improve curb negotiation.  Baseline: side step, UE support  04/29/24: ascends and descends steps step over step - painful and slowly; UE support  Goal status: on going   4.  Patient will demonstrate at least 4+/5 hip flexor strength to improve ability to negotiate stairs.  Baseline: see above Goal status: met  5.  Patient will be independent with advanced home program to progress/maintain current level of function.  Baseline: initial HEP issued  Goal status: on going   6. Patient reports decreased pain when getting out of bed in the morning to no more than 1-2/10   Baseline:   Goal status: NEW 04/03/24  Goal status:  04/29/24 met  7. Patient reports ability to sit for > 1 hour without increased pain when standing   Baseline: pain with standing after sitting < 1 hour   Goal Status: NEW 04/03/24  04/29/24: minimal pain Goal status: 04/29/24 partially met   8. Patient reports ability to ascend and descend 4 or more steps with minimal LBP increase of no more than 1-2/10    Baseline: pain with all stairs   Goal status: NEW 04/03/24   Goal status; going up 4/10; down 3/10 - going slowly   Goal status: on going 04/29/24   PLAN:  PT FREQUENCY: 2x/week  PT DURATION: 8 weeks  PLANNED INTERVENTIONS: [id number]- PT Re-evaluation, 97750- Physical Performance Testing, 97110-Therapeutic exercises, 97530- Therapeutic  activity, [id number]- Neuromuscular re-education, 97535- Self Care, [id number]- Manual therapy, [id number]- Gait training, [phone]- Aquatic Therapy, [phone] (1-2 muscles), [id number] (3+ muscles)- Dry Needling, Cryotherapy, and Moist heat  PLAN FOR NEXT SESSION:  continue with aquatic pool program to work on strengthening  Taronda Comacho P. Ina PT, MPH 04/29/24 4:27 PM

## 2024-05-02 ENCOUNTER — Encounter (HOSPITAL_BASED_OUTPATIENT_CLINIC_OR_DEPARTMENT_OTHER): Payer: Self-pay | Admitting: Physical Therapy

## 2024-05-02 ENCOUNTER — Ambulatory Visit (HOSPITAL_BASED_OUTPATIENT_CLINIC_OR_DEPARTMENT_OTHER): Attending: Family Medicine | Admitting: Physical Therapy

## 2024-05-02 DIAGNOSIS — G8929 Other chronic pain: Secondary | ICD-10-CM | POA: Diagnosis not present

## 2024-05-02 DIAGNOSIS — M6281 Muscle weakness (generalized): Secondary | ICD-10-CM | POA: Insufficient documentation

## 2024-05-02 DIAGNOSIS — R29898 Other symptoms and signs involving the musculoskeletal system: Secondary | ICD-10-CM | POA: Insufficient documentation

## 2024-05-02 DIAGNOSIS — M25561 Pain in right knee: Secondary | ICD-10-CM | POA: Insufficient documentation

## 2024-05-02 DIAGNOSIS — M545 Low back pain, unspecified: Secondary | ICD-10-CM | POA: Diagnosis not present

## 2024-05-02 NOTE — Therapy (Addendum)
 OUTPATIENT PHYSICAL THERAPY LOWER EXTREMITY TREATMENT PHYSICAL THERAPY DISCHARGE SUMMARY  Visits from Start of Care: 9  Current functional level related to goals / functional outcomes: See progress note for discharge status   Remaining deficits: Needs to continue with exercise program to maintain improvement    Education / Equipment: HEP    Patient agrees to discharge. Patient goals were met. Patient is being discharged due to meeting the stated rehab goals.  Celyn P. Ina PT, MPH 08/08/24 8:08 AM     Patient Name: Wanda Gonzales MRN: [id number] DOB:August 12, 1956, 68 y.o., female Today's Date: 05/02/2024  END OF SESSION:  PT End of Session - 05/02/24 1107     Visit Number 9    Number of Visits 17    Date for PT Re-Evaluation 05/18/24    Authorization Type [employer] MCR    Authorization - Visit Number 9    Progress Note Due on Visit 18    PT Start Time 1045    PT Stop Time 1123    PT Time Calculation (min) 38 min         Past Medical History:  Diagnosis Date   Abnormal Pap smear, atypical squamous cells of undetermined sign (ASC-US ) 1998   treated with cryo, CIN I   ADD (attention deficit disorder) 02/23/2012   easily distracted.   Arthritis 02/23/2012   Rt. hip and lt. arm   Closed displaced fracture of fifth metatarsal bone of left foot 03/31/2017   Depression with anxiety    GERD (gastroesophageal reflux disease)    History of kidney stones    History of osteoporosis    PONV (postoperative nausea and vomiting) 02/23/2012   always has PONV   Post-nasal drainage 02/23/2012   frequent a problem,causes a cough and mucus buildup in throat   Urethral polyp 1998   Dr. Duncan   Varicose veins    Venous insufficiency    chronic-denies any problems   Past Surgical History:  Procedure Laterality Date   blood clot removed Left 01/2012   blood clot removed from left hand    BREAST BIOPSY Right 02/14/2012   Fibroadenoma   COLONOSCOPY  08/2003   repeat 5 yrs.    COLONOSCOPY W/ BIOPSIES  12/2008   1 polyp recheck 5 yrs   EXCISION/RELEASE BURSA HIP  02/29/2012   HAND SURGERY  02/23/2012   blood clot excised palm left hand 01-18-12   KNEE ARTHROSCOPY W/ MENISCAL REPAIR  10/2012   KNEE ARTHROSCOPY WITH EXCISION BAKER'S CYST Left 10/2012   meniscal repair   left knee replacement      NASAL SINUS SURGERY     ORIF METATARSAL FRACTURE Left 04/07/2017   Left 5th Metatarsal   scar tissue removed left knee      SHOULDER SURGERY Right 02/2015   Dr. 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   History of total knee arthroplasty, left 12/20/2023   Tremor of left hand 08/23/2023   Pelvic pressure in female 07/14/2023   Proteinuria 07/14/2023   Pelvic pain 07/14/2023   Irritant contact dermatitis 02/20/2023   IBS (irritable bowel  syndrome with diarrhea) 08/17/2022   Hearing aid worn 02/10/2022   Trochanteric bursitis of left hip 02/06/2022   Frequent falls 11/26/2020   Depression with anxiety    History of osteoporosis    Age-related osteoporosis without current pathological fracture 07/29/2020   Osteoporosis 07/27/2020   Rhinitis 03/18/2020   Pain in left knee 09/07/2018   Seborrheic keratosis 11/23/2017   S/P TKR (total knee replacement), left 08/25/2016   Benign paroxysmal positional vertigo 08/25/2016   Mid back pain, chronic 12/16/2015   Cervical strain 11/21/2014   DDD (degenerative disc disease), lumbar 02/17/2014   Vitamin D  deficiency 03/18/2013   Trochanteric bursitis of right hip 02/29/2012   INSOMNIA 04/13/2010   Hyperlipidemia 09/17/2007   CERVICAL POLYP 09/17/2007    Attention deficit disorder 09/11/2007   UNSPECIFIED VENOUS INSUFFICIENCY 08/29/2007    PCP: Alvan Dorothyann BIRCH, MD  REFERRING PROVIDER:   Reena Roxie CROME, PA; Alvan Dorothyann BIRCH, MD    REFERRING DIAG: [id number] (ICD-10-CM) - Other instability, right knee; M54.50 (ICD-10-CM) - Acute bilateral low back pain without sciatica    THERAPY DIAG:  Other symptoms and signs involving the musculoskeletal system  Chronic bilateral low back pain without sciatica  Acute pain of right knee  Muscle weakness (generalized)  Rationale for Evaluation and Treatment: Rehabilitation  ONSET DATE: April 2025     LBP 03/28/24  SUBJECTIVE:   SUBJECTIVE STATEMENT: Patient reports she saw Dr. Hiram last week and he was pleased with her progress. Feels the knee is improving and she needs to continue with strengthening to better function. Her back is feeling much better. She is doing her exercises and using heat to low back.    PERTINENT HISTORY: History of LBP for 10 years  Multiple Lt knee surgeries- most recent total knee revision 12/20/23 Osteoporosis Venous insufficiency   Patient reports the Rt knee is getting better since initial onset in April. It was popping a lot and felt like the knee cap was moving out of place. Every now and then it will still pop and shift (at least 5 times daily). It hurts when it pops and shifts and she will sometimes have to push the knee cap back in. She reports this has been happening to the Rt knee since her Lt knee surgery in April. Has also noticed some popping in the Lt knee recently as well. Does not feel like the knee gives away when walking. Also notes Lt buttock pain that began Saturday.  PAIN:  Are you having pain? Yes: NPRS scale: 0/10 Pain location: low/mid back Pain description: aching Aggravating factors:  sitting, lifting/carrying Relieving factors: rest; heat; pool    PRECAUTIONS: Fall  WEIGHT BEARING RESTRICTIONS: No  FALLS:  Has  patient fallen in last 6 months? Yes. Number of falls 2-3 was falling prior to Lt knee surgery; 04/29/24 one fall since surgery no injury  LIVING ENVIRONMENT: Lives with: lives with their spouse Lives in: House/apartment Stairs: No Has following equipment at home: [employer] - 2 wheeled  OCCUPATION: part-time wellsite geologist   PATIENT GOALS: manage the Rt knee independently. I don't want my knees to hurt.   NEXT MD VISIT: 4/26  OBJECTIVE:  Note: Objective measures were completed at Evaluation unless otherwise noted.  DIAGNOSTIC FINDINGS: none on file   PATIENT SURVEYS: EVAL LEFS: 39/80  PSFS:  Getting up in the morning 9 Standing from sitting > 1 hour 5 Going up steps 4 18/3 = 6  PATIENT SURVEYS: 04/29/24 LEFS:  39/80  PSFS:  Getting up in the morning 0 Standing from sitting > 1 hour 0 Going up steps 4 4/3 = 1.33  COGNITION: Overall cognitive status: Within functional limits for tasks assessed     SENSATION: Not tested   MUSCLE LENGTH: Hamstrings: Rt WNL  POSTURE: mild genu valgum, foot inversion   PALPATION: No palpable tenderness   04/03/24: muscular tightness R > L lumbar paraspinals; R posterior hip through gluts and piriformis; bilat hip flexors  Pain with PA mobs lower lumbar spine   04/03/24: LUMBAR ROM: no pain with lumbar ROM  Active 04/03/24  A/PROM  eval AROM 04/29/24  Flexion 70% LBP 85%  Extension 60% min LBP 65%   Right lateral flexion 80%   Left lateral flexion 80%    Right rotation 30%   Left rotation 30%    (Blank rows = not tested)    LOWER EXTREMITY ROM: 04/03/24: hip ROM - WFLs   Active ROM Right eval Left eval  Hip flexion    Hip extension    Hip abduction    Hip adduction    Hip internal rotation    Hip external rotation    Knee flexion WNL   Knee extension WNL   Ankle dorsiflexion    Ankle plantarflexion    Ankle inversion    Ankle eversion     (Blank rows = not tested)  LOWER EXTREMITY  MMT:  MMT Right eval Right  04/03/24 Right  04/29/24 Left eval Left  04/03/24 Left  04/29/24  Hip flexion 4- 4 4+ 4 4 4+  Hip extension 4- 4-  4- 4   Hip abduction 4- 3+painful LB 4 4- 4 4+  Hip adduction        Hip internal rotation        Hip external rotation        Knee flexion 5   4+  5  Knee extension 5   4+  5  Ankle dorsiflexion        Ankle plantarflexion        Ankle inversion        Ankle eversion         (Blank rows = not tested)  LOWER EXTREMITY SPECIAL TESTS:  Knee special tests: Patellafemoral apprehension test: negative  FUNCTIONAL TESTS:  SLS: LLE: 3 seconds, RLE: 2 seconds  Stair negotiation- turns sideways to go down 4 inch step; UE support   04/29/24: SLS-  LLE 4 sec with touch of L hand for balance: R LE 10 sec one touch of L hand 5 times sit to stand; 19.15 sec no use of hands, more control in stand to sit - no plop Stairs negotiation - not confident; going slowly step over step with UE support   04/03/24: five times sit to stand: 19.15 sce using hands on upper thighs knees dropping in; plop down on descent    GAIT: Distance walked: 20 ft  Assistive device utilized: None Level of assistance: Complete Independence Comments: Trendelenburg, limited knee/hip flexion   [facility] Adult PT Treatment:                                             Date: 05/02/24 Pt seen for aquatic therapy today.  Treatment took place in water 3.5-4.75 ft in depth at the [facility] pool. Temp of water was 91.  Pt entered/exited  the pool via stairs in step-to pattern with bil rail.  - unsupported walking forward/ backward for warm up, cues for more vertical trun - side stepping with arm add/abdct with rainbow hand float  - UE on rainbow hand floats:  single leg clam x 10 each LE - TrA set with single rainbow hand float pull down to thighs x 5 each -> hollow noodle x 10   - Lt runners step ups x 5 (painful) - Rt forward step down, Lt retro step up x 5 (initially tolerated,  but then painful) - Lt quad stretch with ankle on hollow noodle x 20sec (limited tolerance)-> with foot on 2nd step behind her x 20sec (limited tolerance) - return to walking forward/ backward - straddling noodle without support: suspended cycling   Pt requires the buoyancy and hydrostatic pressure of water for support, and to offload joints by unweighting joint load by at least 50 % in navel deep water and by at least 75-80% in chest to neck deep water.  Viscosity of the water is needed for resistance of strengthening. Water current perturbations provides challenge to standing balance requiring increased core activation.    [facility] Adult PT Treatment:                                                DATE: 04/29/2024 Reassessment - ROM and strength measurements Neuromuscular re-ed: PF activation + anterior --> posterior pelvic tilt  Diaphragmatic breathing --> side lying & sitting  Therapeutic Activity: Sit to stand x 5 SLR core engaged x 10  Bridge 3 sec x 10  Alternate hip abduction green TB x 10    [facility] Adult PT Treatment:                                                DATE: 04/17/2024 Neuromuscular re-ed: PF activation + anterior --> posterior pelvic tilt  Diaphragmatic breathing --> side lying & sitting Posterior PT with TA & PF activation Therapeutic Activity: Standing hip hinge with dowel Squat with hip hinge Attempted squat lift with 5#KB --> unable to maintain flat back   [facility] Adult PT Treatment:                                                DATE: 04/10/24 Therapeutic Exercise: Physioball rollout x 2 minutes  LTR with figure 4 x 1 minute  Manual Therapy: STM bilateral thoracolumbar paraspinals/QL Therapeutic Activity: Seated hip hinge with dowel 2 x 10; heavy cues  Self Care: Discussed bending/lifting mechanics, posture, sleep positioning, transfer techniques with handout provided.    [facility] Adult PT Treatment:                                                DATE:  04/08/2024 Therapeutic Exercise: Self-massage piriformis with tennis ball  Seated figure 4 stretch (Lt) DKTC --> bothered back of Lt knee Seated forward flexion with red PB Paloff press in staggered stance + blue  TB LTR S/L open books Seated forward trunk flexion stretch Neuromuscular re-ed: Supine --> sitting diaphragmatic breathing Seated pelvic tilts on dyno disc Seated on dyno disc + slow foot hover for abdominal bracing Unilateral 90/90 leg + opp hand bracing yoga block for PF activation Seated low march for abdominal bracing                                                                                                                           PATIENT EDUCATION:  Education details: see treatment Person educated: Patient Education method: Explanation, demo, handout Education comprehension: verbalized understanding  HOME EXERCISE PROGRAM: Access Code: [email or web address] URL: [email or web address] Date: 04/17/2024 Prepared by: Lamarr Price  Exercises - Supine Active Straight Leg Raise  - 1 x daily - 7 x weekly - 2 sets - 10 reps - Sidelying Hip Abduction  - 1 x daily - 7 x weekly - 2 sets - 10 reps - Seated Knee Extension with Resistance  - 1 x daily - 7 x weekly - 2 sets - 10 reps - Tandem Stance  - 1 x daily - 7 x weekly - 3 sets - max  hold - Heel Raises with Counter Support  - 1 x daily - 7 x weekly - 2 sets - 10 reps - Prone Press Up  - 2 x daily - 7 x weekly - 1 sets - 10 reps - 2-3 sec  hold - Prone Gluteal Sets  - 2 x daily - 7 x weekly - 1 sets - 10 reps - 10 sec  hold - Supine Transversus Abdominis Bracing - Hands on Stomach  - 2 x daily - 7 x weekly - 1 sets - 10 reps - 10 sec  hold - Supine Bridge  - 2 x daily - 7 x weekly - 1-2 sets - 10 reps - 5-10 sec  hold - Supine Shoulder Flexion Extension AAROM with Dowel  - 1 x daily - 7 x weekly - 1-2 sets - 10 reps - 3 sec  hold - Supine Lower Trunk Rotation  - 1 x daily - 7 x weekly - 1 sets - 10 reps -  Sidelying Open Book Thoracic Lumbar Rotation and Extension  - 1 x daily - 7 x weekly - 1 sets - 10 reps - Seated Flexion Stretch  - 1 x daily - 7 x weekly - 1 sets - 10 reps - Standing Hip Hinge with Dowel  - 1 x daily - 7 x weekly - 1 sets - 10 reps - Squat with Chair Touch  - 1 x daily - 7 x weekly - 1 sets - 10 reps  Aquatic HEP:  [id number]  ASSESSMENT:  CLINICAL IMPRESSION: Reviewed previously issued aquatic HEP.  Pt has limited tolerance for Lt knee flexion with decending stairs and with standing quad stretch. Pt reported pain in Lt patella area with these exercises, reduced with rest.  Will plan to update and re-issue aquatic HEP for her  to take to aquatic center at d/c.    OBJECTIVE IMPAIRMENTS: Abnormal gait, decreased activity tolerance, decreased balance, decreased endurance, decreased knowledge of condition, difficulty walking, decreased strength, impaired flexibility, improper body mechanics, postural dysfunction, and pain.      GOALS: Goals reviewed with patient? Yes  SHORT TERM GOALS: Target date: 04/15/2024  Patient will be independent and compliant with initial HEP.  Baseline: issued at eval  Goal status: MET  2.  Patient will maintain SLS for at least 5 seconds to improve gait stability.  Baseline: see above 04/29/24: 10 sec R; 4 sec with hand touch for balance L  Goal status: partially accomplished   3.  Patient will report </= 2 instances daily of Rt knee popping indicative of improvement in current condition.  Baseline: > 5  04/10/24: maybe 3 04/29/24: 0-1 maybe Goal status: met   LONG TERM GOALS: Target date: 05/18/24  Patient will score >/= 50/80 on the LEFS (MCID is 9) to signify clinically meaningful improvement in functional abilities.  Baseline: see above 04/29/24: 1.33 Goal status: met  2.  Patient will demonstrate at least 4+/5 hip abductor strength to improve stability about the chain with prolonged walking activity.  Baseline: see above Goal  status: on going  3.  Patient will be able to complete step down from 4 inch step forward facing without UE support to improve curb negotiation.  Baseline: side step, UE support  04/29/24: ascends and descends steps step over step - painful and slowly; UE support  Goal status: on going   4.  Patient will demonstrate at least 4+/5 hip flexor strength to improve ability to negotiate stairs.  Baseline: see above Goal status: met  5.  Patient will be independent with advanced home program to progress/maintain current level of function.  Baseline: initial HEP issued  Goal status: on going   6. Patient reports decreased pain when getting out of bed in the morning to no more than 1-2/10   Baseline:   Goal status: NEW 04/03/24  Goal status: 04/29/24 met  7. Patient reports ability to sit for > 1 hour without increased pain when standing   Baseline: pain with standing after sitting < 1 hour   Goal Status: NEW 04/03/24  04/29/24: minimal pain Goal status: 04/29/24 partially met   8. Patient reports ability to ascend and descend 4 or more steps with minimal LBP increase of no more than 1-2/10    Baseline: pain with all stairs   Goal status: NEW 04/03/24   Goal status; going up 4/10; down 3/10 - going slowly   Goal status: on going 04/29/24   PLAN:  PT FREQUENCY: 2x/week  PT DURATION: 8 weeks  PLANNED INTERVENTIONS: 97164- PT Re-evaluation, 97750- Physical Performance Testing, 97110-Therapeutic exercises, 97530- Therapeutic activity, 97112- Neuromuscular re-education, 97535- Self Care, 02859- Manual therapy, [id number]- Gait training, [phone]- Aquatic Therapy, [phone] (1-2 muscles), 20561 (3+ muscles)- Dry Needling, Cryotherapy, and Moist heat  PLAN FOR NEXT SESSION:  continue with aquatic pool program to work on strengthening  [place], [place] 05/02/24 11:49 AM [facility] [address] [place], [place], [address] Phone: [phone]    Fax:  [phone]

## 2024-05-04 ENCOUNTER — Other Ambulatory Visit: Payer: Self-pay

## 2024-05-04 ENCOUNTER — Ambulatory Visit
Admission: EM | Admit: 2024-05-04 | Discharge: 2024-05-04 | Disposition: A | Discharge status: Home / Self Care | Attending: Emergency Medicine | Admitting: Emergency Medicine

## 2024-05-04 ENCOUNTER — Ambulatory Visit (INDEPENDENT_AMBULATORY_CARE_PROVIDER_SITE_OTHER)

## 2024-05-04 ENCOUNTER — Inpatient Hospital Stay: Admission: RE | Admit: 2024-05-04 | Source: Ambulatory Visit

## 2024-05-04 DIAGNOSIS — U071 COVID-19: Secondary | ICD-10-CM

## 2024-05-04 DIAGNOSIS — R509 Fever, unspecified: Secondary | ICD-10-CM | POA: Diagnosis not present

## 2024-05-04 DIAGNOSIS — R519 Headache, unspecified: Secondary | ICD-10-CM | POA: Diagnosis not present

## 2024-05-04 DIAGNOSIS — R059 Cough, unspecified: Secondary | ICD-10-CM | POA: Diagnosis not present

## 2024-05-04 DIAGNOSIS — R0602 Shortness of breath: Secondary | ICD-10-CM | POA: Diagnosis not present

## 2024-05-04 DIAGNOSIS — R051 Acute cough: Secondary | ICD-10-CM

## 2024-05-04 LAB — POC SARS CORONAVIRUS 2 AG -  ED: SARS Coronavirus 2 Ag: POSITIVE — AB

## 2024-05-04 MED ORDER — PAXLOVID (300/100) 20 X 150 MG & 10 X 100MG PO TBPK: 3.0000 | ORAL_TABLET | Freq: Two times a day (BID) | ORAL | 0 refills | Status: AC

## 2024-05-04 MED ORDER — BENZONATATE 100 MG PO CAPS: 100.0000 mg | ORAL_CAPSULE | Freq: Three times a day (TID) | ORAL | 0 refills | Status: DC

## 2024-05-04 NOTE — ED Notes (Signed)
Provider aware of VS.

## 2024-05-04 NOTE — Discharge Instructions (Signed)
 Start taking the Paxlovid  and take this daily as prescribed.  You can use the Tessalon  Perles every 8 hours to help with cough.  Ensure you are drinking at least 64 ounces of water daily, you can take 1200 mg of Mucinex daily as well to help loosen secretions, consider sleeping with a humidifier.  Alternate between Tylenol  and ibuprofen  to help with fever, body aches and chills.   Do not take your rosuvastatin  while taking the Paxlovid .  You can resume your rosuvastatin  after completing Paxlovid .  There is a potential weak interaction between Paxlovid  and your doxepin , taking them together is not really been studied.  If able please avoid taking your doxepin  while taking Paxlovid .  Symptoms should improve with Paxlovid , sometimes there is rebound symptoms after completing Paxlovid .  Please return to clinic or follow-up with primary care provider if you develop any new concerning symptoms.

## 2024-05-04 NOTE — ED Provider Notes (Signed)
 [facility]    CSN: [id number] Arrival date & time: 05/04/24  0956      History   Chief Complaint Chief Complaint  Patient presents with   Cough    HPI [name] is a 68 y.o. female.   Patient presents to clinic for concerns of cough, congestion, sinus pressure, sore throat, ear pain and feeling unwell.  On 8/06 patient was diagnosed with an upper respiratory tract infection and started on Augmentin  twice daily for 7 days as well as 20 mg of prednisone  daily for 5 days.  She has since completed this therapy and started to feel much worse around 4-5 days ago.  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  Has had hot and cold chills.  Reports congestion, cough, sinus pressure, left-sided sore throat and left-sided ear pain.  Cough is dry nonproductive.  Denies wheezing or shortness of breath.  Did take Tylenol  this morning around 9 AM.  The history is provided by the patient and medical records.  Cough   Past Medical History:  Diagnosis Date   Abnormal Pap smear, atypical squamous cells of undetermined sign (ASC-US ) 1998   treated with cryo, CIN I   ADD (attention deficit disorder) 02/23/2012   easily distracted.   Arthritis 02/23/2012   Rt. hip and lt. arm   Closed displaced fracture of fifth metatarsal bone of left foot 03/31/2017   Depression with anxiety    GERD (gastroesophageal reflux disease)    History of kidney stones    History of osteoporosis    PONV (postoperative nausea and vomiting) 02/23/2012   always has PONV   Post-nasal drainage 02/23/2012   frequent a problem,causes a cough and mucus buildup in throat   Urethral polyp 1998   Dr. 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   History of total knee arthroplasty, left 12/20/2023   Tremor  of left hand 08/23/2023   Pelvic pressure in female 07/14/2023   Proteinuria 07/14/2023   Pelvic pain 07/14/2023   Irritant contact dermatitis 02/20/2023   IBS (irritable bowel syndrome with diarrhea) 08/17/2022   Hearing aid worn 02/10/2022   Trochanteric bursitis of left hip 02/06/2022   Frequent falls 11/26/2020   Depression with anxiety    History of osteoporosis    Age-related osteoporosis without current pathological fracture 07/29/2020   Osteoporosis 07/27/2020   Rhinitis 03/18/2020   Pain in left knee 09/07/2018   Seborrheic keratosis 11/23/2017   S/P TKR (total knee replacement), left 08/25/2016   Benign paroxysmal positional vertigo 08/25/2016   Mid back pain, chronic 12/16/2015   Cervical strain 11/21/2014   DDD (degenerative disc disease), lumbar 02/17/2014   Vitamin D  deficiency 03/18/2013   Trochanteric bursitis of right hip 02/29/2012   INSOMNIA 04/13/2010   Hyperlipidemia 09/17/2007   CERVICAL POLYP 09/17/2007   Attention deficit disorder 09/11/2007   UNSPECIFIED VENOUS INSUFFICIENCY 08/29/2007    Past Surgical History:  Procedure Laterality Date   blood clot removed Left 01/2012   blood clot removed from left hand    BREAST BIOPSY Right 02/14/2012   Fibroadenoma   COLONOSCOPY  08/2003   repeat 5 yrs.   COLONOSCOPY W/ BIOPSIES  12/2008   1 polyp recheck 5 yrs   EXCISION/RELEASE BURSA HIP  02/29/2012   HAND SURGERY  02/23/2012   blood clot excised palm left hand 01-18-12   KNEE ARTHROSCOPY W/ MENISCAL REPAIR  10/2012   KNEE ARTHROSCOPY WITH EXCISION BAKER'S CYST Left 10/2012   meniscal repair   left knee replacement      NASAL SINUS SURGERY     ORIF METATARSAL FRACTURE Left 04/07/2017   Left 5th Metatarsal   scar tissue removed left knee      SHOULDER SURGERY Right 02/2015   Dr. 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 Authorizing Provider  benzonatate  (TESSALON ) 100 MG capsule Take 1 capsule (100 mg total) by mouth every 8 (eight) hours. 05/04/24  Yes Horice Carrero  N, FNP  nirmatrelvir/ritonavir (PAXLOVID , 300/100,) 20 x 150 MG & 10 x 100MG  TBPK Take 3 tablets by mouth 2 (two) times daily for 5 days. Patient GFR is WNL Take nirmatrelvir (150 mg) two tablets twice daily for 5 days and ritonavir (100 mg) one tablet twice daily for 5 days. 05/04/24 05/09/24 Yes Krishauna Schatzman  N, FNP  alendronate (FOSAMAX) 70 MG tablet Take 70 mg by mouth once a week. 07/27/20   [provider]  doxepin  (SINEQUAN ) 10 MG/ML solution 0.3 ml PO at bedtime for sleep 03/13/24   Alvan Dorothyann BIRCH, MD  escitalopram  (LEXAPRO ) 10 MG tablet TAKE 1 TABLET(10 MG) BY MOUTH DAILY 02/19/24   Alvan Dorothyann BIRCH, MD  Krill Oil 500 MG CAPS Take 500 mg by mouth daily.    [provider]  Multiple Vitamin (MULTIVITAMIN) tablet Take 1 tablet by mouth daily.    [provider]  omeprazole (PRILOSEC) 40 MG capsule TAKE 1 CAPSULE(40 MG) BY MOUTH DAILY BEFORE BREAKFAST 09/27/22   [provider]  rosuvastatin  (CRESTOR ) 10 MG tablet TAKE 1 TABLET(10 MG) BY MOUTH AT BEDTIME 02/19/24   Alvan Dorothyann BIRCH, MD  VITAMIN D  PO Take 1 each by mouth daily.    [provider]    Family History Family History  Problem Relation Age of Onset   Breast cancer Mother 67       breast   Hyperlipidemia Mother    Brain cancer Father 36       brain   Osteoporosis Maternal Grandmother    Drug  abuse Brother     Social History Social History   Tobacco Use   Smoking status: Never   Smokeless tobacco: Never  Vaping Use   Vaping status: Never Used  Substance Use Topics   Alcohol use: Not Currently    Alcohol/week: 1.0 standard drink of alcohol    Types: 1 Glasses of wine per  week    Comment: rare occ.   Drug use: No     Allergies   Codeine, Doxycycline, Concerta  [methylphenidate ], and Sulfa  antibiotics   Review of Systems Review of Systems  Per HPI  Physical Exam Triage Vital Signs ED Triage Vitals  Encounter Vitals Group     BP 05/04/24 1025 (!) 139/117     Girls Systolic BP Percentile --      Girls Diastolic BP Percentile --      Boys Systolic BP Percentile --      Boys Diastolic BP Percentile --      Pulse Rate 05/04/24 1025 (!) 130     Resp 05/04/24 1025 (!) 26     Temp 05/04/24 1025 99.1 F (37.3 C)     Temp src --      SpO2 05/04/24 1025 96 %     Weight --      Height --      Head Circumference --      Peak Flow --      Pain Score 05/04/24 1028 8     Pain Loc --      Pain Education --      Exclude from Growth Chart --    No data found.  Updated Vital Signs BP (!) 139/117   Pulse (!) 130   Temp 99.1 F (37.3 C)   Resp (!) 26   LMP 12/18/2013   SpO2 96%   Visual Acuity Right Eye Distance:   Left Eye Distance:   Bilateral Distance:    Right Eye Near:   Left Eye Near:    Bilateral Near:     Physical Exam Vitals and nursing note reviewed.  Constitutional:      Appearance: Normal appearance.  HENT:     Head: Normocephalic and atraumatic.     Right Ear: Tympanic membrane, ear canal and external ear normal.     Left Ear: Tympanic membrane, ear canal and external ear normal.     Nose: Congestion and rhinorrhea present.     Mouth/Throat:     Mouth: Mucous membranes are moist.     Pharynx: Posterior oropharyngeal erythema present.  Eyes:     Conjunctiva/sclera: Conjunctivae normal.  Cardiovascular:     Rate and Rhythm: Normal  rate and regular rhythm.     Heart sounds: Normal heart sounds. No murmur heard. Pulmonary:     Effort: Pulmonary effort is normal. No respiratory distress.     Breath sounds: Normal breath sounds. No wheezing.  Skin:    General: Skin is warm and dry.  Neurological:     General: No focal deficit present.     Mental Status: She is alert and oriented to person, place, and time.  Psychiatric:        Mood and Affect: Mood normal.        Behavior: Behavior normal. Behavior is cooperative.      UC Treatments / Results  Labs (all labs ordered are listed, but only abnormal results are displayed) Labs Reviewed  POC SARS CORONAVIRUS 2 AG -  ED - Abnormal; Notable for the following components:      Result Value   SARS Coronavirus 2 Ag Positive (*)    All other components within normal limits    EKG   Radiology DG Chest 2 View Result Date: 05/04/2024 CLINICAL DATA:  Cough for 3 days, short of breath, fever, headache EXAM: CHEST - 2 VIEW COMPARISON:  07/18/2017 FINDINGS: Frontal and lateral views of  the chest demonstrate an unremarkable cardiac silhouette. No acute airspace disease, effusion, or pneumothorax. Mild anterior wedge compression deformity within the lower thoracic spine at approximately T11, chronic. No other acute bony abnormalities. IMPRESSION: 1. No acute intrathoracic process. Electronically Signed   By: Ozell Daring M.D.   On: 05/04/2024 11:19    Procedures Procedures (including critical care time)  Medications Ordered in UC Medications - No data to display  Initial Impression / Assessment and Plan / UC Course  I have reviewed the triage vital signs and the nursing notes.  Pertinent labs & imaging results that were available during my care of the patient were reviewed by me and considered in my medical decision making (see chart for details).  Vitals and triage reviewed, patient is hemodynamically stable.  Initially tachycardic, suspect tachypneic from coughing  episode as patient is without tachypnea during physical exam.  Lungs vesicular, heart with regular rate and rhythm, tachycardia.  POC COVID testing positive.  Patient is congested with rhinorrhea.  Chest x-ray by my interpretation does not show pneumonia.   eGFR 2 months ago 72, Cr 0.88, candidate for Paxlovid , wishes to proceed.  Advised holding rosuvastatin  while on this medication and not taking doxepin .  Symptomatic management for viral illness discussed.  Plan of care, follow-up care return precautions given, no questions at this time.     Final Clinical Impressions(s) / UC Diagnoses   Final diagnoses:  Acute cough  COVID-19 virus infection     Discharge Instructions      Start taking the Paxlovid  and take this daily as prescribed.  You can use the Tessalon  Perles every 8 hours to help with cough.  Ensure you are drinking at least 64 ounces of water daily, you can take 1200 mg of Mucinex daily as well to help loosen secretions, consider sleeping with a humidifier.  Alternate between Tylenol  and ibuprofen  to help with fever, body aches and chills.   Do not take your rosuvastatin  while taking the Paxlovid .  You can resume your rosuvastatin  after completing Paxlovid .  There is a potential weak interaction between Paxlovid  and your doxepin , taking them together is not really been studied.  If able please avoid taking your doxepin  while taking Paxlovid .  Symptoms should improve with Paxlovid , sometimes there is rebound symptoms after completing Paxlovid .  Please return to clinic or follow-up with primary care provider if you develop any new concerning symptoms.     ED Prescriptions     Medication Sig Dispense Auth. Provider   benzonatate  (TESSALON ) 100 MG capsule Take 1 capsule (100 mg total) by mouth every 8 (eight) hours. 21 capsule Dreama, Tyeisha Dinan  N, FNP   nirmatrelvir/ritonavir (PAXLOVID , 300/100,) 20 x 150 MG & 10 x 100MG  TBPK Take 3 tablets by mouth 2 (two) times daily  for 5 days. Patient GFR is WNL Take nirmatrelvir (150 mg) two tablets twice daily for 5 days and ritonavir (100 mg) one tablet twice daily for 5 days. 30 tablet Dreama, Soham Hollett  N, FNP      PDMP not reviewed this encounter.   Dreama Alexandria SAILOR, FNP 05/04/24 1124

## 2024-05-04 NOTE — ED Triage Notes (Signed)
 C/o chills, congestion, cough, sinus pressure, sore throat and bil ear pain x 4 days. Doesn't think she's had a fever but has been hot and cold. Has had tylenol .

## 2024-05-08 ENCOUNTER — Ambulatory Visit (HOSPITAL_BASED_OUTPATIENT_CLINIC_OR_DEPARTMENT_OTHER): Admitting: Physical Therapy

## 2024-05-08 ENCOUNTER — Telehealth: Payer: Self-pay

## 2024-05-09 DIAGNOSIS — Z129 Encounter for screening for malignant neoplasm, site unspecified: Secondary | ICD-10-CM | POA: Diagnosis not present

## 2024-05-09 DIAGNOSIS — L821 Other seborrheic keratosis: Secondary | ICD-10-CM | POA: Diagnosis not present

## 2024-05-09 DIAGNOSIS — L905 Scar conditions and fibrosis of skin: Secondary | ICD-10-CM | POA: Diagnosis not present

## 2024-05-09 DIAGNOSIS — D1801 Hemangioma of skin and subcutaneous tissue: Secondary | ICD-10-CM | POA: Diagnosis not present

## 2024-05-21 ENCOUNTER — Encounter: Payer: Self-pay | Admitting: Sports Medicine

## 2024-06-27 ENCOUNTER — Encounter: Payer: Self-pay | Admitting: Family Medicine

## 2024-06-27 ENCOUNTER — Ambulatory Visit (INDEPENDENT_AMBULATORY_CARE_PROVIDER_SITE_OTHER): Admitting: Family Medicine

## 2024-06-27 VITALS — BP 108/65 | HR 74

## 2024-06-27 DIAGNOSIS — E663 Overweight: Secondary | ICD-10-CM | POA: Diagnosis not present

## 2024-06-27 DIAGNOSIS — R251 Tremor, unspecified: Secondary | ICD-10-CM | POA: Diagnosis not present

## 2024-06-27 DIAGNOSIS — F418 Other specified anxiety disorders: Secondary | ICD-10-CM

## 2024-06-27 DIAGNOSIS — M51369 Other intervertebral disc degeneration, lumbar region without mention of lumbar back pain or lower extremity pain: Secondary | ICD-10-CM | POA: Diagnosis not present

## 2024-06-27 DIAGNOSIS — Z23 Encounter for immunization: Secondary | ICD-10-CM | POA: Diagnosis not present

## 2024-06-27 NOTE — Assessment & Plan Note (Signed)
 Discussed GLP-1s we discussed pros and cons of the medication and encouraged her to check with insurance on coverage I would have to be able to track her BMI regularly and we often do not weigh here because sometimes it can be triggering for her body image.

## 2024-06-27 NOTE — Assessment & Plan Note (Signed)
 She is actually doing really well overall currently on Lexapro  10 mg she was working with a therapist out of [place]  but that therapist decided to go in a different direction.  But she feels like she is in a good place in regards to body image.  Though she is interested in weight loss medications.

## 2024-06-27 NOTE — Progress Notes (Signed)
 Established Patient Office Visit  Subjective  Patient ID: Wanda Gonzales, female    DOB: [birth year]  Age: 68 y.o. MRN: [id number]  Chief Complaint  Patient presents with   mood    HPI  4 months f/u for mood.    Discussed the use of AI scribe software for clinical note transcription with the patient, who gave verbal consent to proceed.  History of Present Illness Wanda Gonzales is a 68 year old female who presents with concerns about back pain and knee issues.  Low back pain - Intermittent pain with occasional flares, particularly after certain movements in Pilates class - Describes episodes where her back 'started to lock up' during specific movement - Manages symptoms with massages and physical therapy - Engages in pool exercises and walking for activity  Knee pain and instability - Right knee occasionally 'popping out' - Physical therapist recommended use of a rolling pin for IT band - Left knee has history of four surgeries, currently with only occasional pain  Tremors - Occasional tremors, particularly in the left hand - Tremors occur without specific pattern or trigger - No family history of tremors  Weight management - Desires weight loss - Considering weight loss medications seen in advertisements - Aware of financial implications and need for insurance coverage for these medications      ROS    Objective:     BP 108/65   Pulse 74   LMP 12/18/2013   SpO2 96%    Physical Exam Vitals and nursing note reviewed.  Constitutional:      Appearance: Normal appearance.  HENT:     Head: Normocephalic and atraumatic.  Eyes:     Conjunctiva/sclera: Conjunctivae normal.  Cardiovascular:     Rate and Rhythm: Normal rate and regular rhythm.  Pulmonary:     Effort: Pulmonary effort is normal.     Breath sounds: Normal breath sounds.  Skin:    General: Skin is warm and dry.  Neurological:     Mental Status: She is alert.  Psychiatric:        Mood and  Affect: Mood normal.      No results found for any visits on 06/27/24.    The 10-year ASCVD risk score (Arnett DK, et al., 2019) is: 4.9%    Assessment & Plan:   Problem List Items Addressed This Visit       Musculoskeletal and Integument   DDD (degenerative disc disease), lumbar   Chronic low back pain Intermittent flare-ups exacerbated by certain movements in Pilates. - Continue Pilates with modifications to avoid exacerbating back pain. - Engage in water exercises for strengthening and stretching. - Consult with Pilates instructor for modified exercises.        Other   Tremor of left hand   Tremor, unspecified Intermittent tremor primarily in the left hand with no clear pattern or family history. Possible causes include physiologic tremor due to caffeine, stress, or low blood sugar. - Monitor tremor for changes in frequency or severity.      Overweight   Discussed GLP-1s we discussed pros and cons of the medication and encouraged her to check with insurance on coverage I would have to be able to track her BMI regularly and we often do not weigh here because sometimes it can be triggering for her body image.      Depression with anxiety - Primary   She is actually doing really well overall currently on Lexapro  10 mg she was working  with a therapist out of [place]  but that therapist decided to go in a different direction.  But she feels like she is in a good place in regards to body image.  Though she is interested in weight loss medications.      Assessment and Plan Assessment & Plan   Right knee pain Intermittent pain with popping sensation. - Use a rolling pin for IT band relief.  Obesity Discussed interest in weight loss medications like Wegovy, including effectiveness, cost, long-term commitment, and potential side effects such as nausea and constipation. - Check insurance coverage for weight loss medications. - Consider long-term commitment to weight  loss medication if covered.   General Health Maintenance Mood appears stable based on PHQ-9 and GAD questionnaires. - Discussed potential volunteer opportunities post-retirement.   Return in about 4 months (around 10/28/2024) for Mood.    Wanda Byars, MD

## 2024-06-27 NOTE — Patient Instructions (Signed)
 Can check with insurance on   Saxenda Wegovy Zepbound

## 2024-06-27 NOTE — Assessment & Plan Note (Signed)
 Chronic low back pain Intermittent flare-ups exacerbated by certain movements in Pilates. - Continue Pilates with modifications to avoid exacerbating back pain. - Engage in water exercises for strengthening and stretching. - Consult with Pilates instructor for modified exercises.

## 2024-06-27 NOTE — Assessment & Plan Note (Signed)
 Tremor, unspecified Intermittent tremor primarily in the left hand with no clear pattern or family history. Possible causes include physiologic tremor due to caffeine, stress, or low blood sugar. - Monitor tremor for changes in frequency or severity.

## 2024-07-30 DIAGNOSIS — K219 Gastro-esophageal reflux disease without esophagitis: Secondary | ICD-10-CM | POA: Diagnosis not present

## 2024-07-30 DIAGNOSIS — R159 Full incontinence of feces: Secondary | ICD-10-CM | POA: Diagnosis not present

## 2024-07-30 DIAGNOSIS — K580 Irritable bowel syndrome with diarrhea: Secondary | ICD-10-CM | POA: Diagnosis not present

## 2024-08-07 ENCOUNTER — Ambulatory Visit

## 2024-08-08 ENCOUNTER — Ambulatory Visit

## 2024-08-19 DIAGNOSIS — M810 Age-related osteoporosis without current pathological fracture: Secondary | ICD-10-CM | POA: Diagnosis not present

## 2024-08-22 ENCOUNTER — Ambulatory Visit

## 2024-08-28 ENCOUNTER — Other Ambulatory Visit: Payer: Self-pay | Admitting: Family Medicine

## 2024-08-28 DIAGNOSIS — F418 Other specified anxiety disorders: Secondary | ICD-10-CM

## 2024-08-29 ENCOUNTER — Ambulatory Visit (INDEPENDENT_AMBULATORY_CARE_PROVIDER_SITE_OTHER)

## 2024-08-29 VITALS — BP 96/62 | HR 94 | Ht 61.0 in

## 2024-08-29 DIAGNOSIS — Z0000 Encounter for general adult medical examination without abnormal findings: Secondary | ICD-10-CM

## 2024-08-29 NOTE — Patient Instructions (Signed)
 Wanda Gonzales,  Thank you for taking the time for your Medicare Wellness Visit. I appreciate your continued commitment to your health goals. Please review the care plan we discussed, and feel free to reach out if I can assist you further.  Please note that Annual Wellness Visits do not include a physical exam. Some assessments may be limited, especially if the visit was conducted virtually. If needed, we may recommend an in-person follow-up with your provider.  Ongoing Care Seeing your primary care provider every 3 to 6 months helps us  monitor your health and provide consistent, personalized care.   Referrals If a referral was made during today's visit and you haven't received any updates within two weeks, please contact the referred provider directly to check on the status.  Recommended Screenings:  Health Maintenance  Topic Date Due   COVID-19 Vaccine (7 - 2025-26 season) 05/20/2024   Medicare Annual Wellness Visit  07/11/2024   Colon Cancer Screening  12/25/2024   Osteoporosis screening with Bone Density Scan  08/31/2025   Breast Cancer Screening  11/14/2025   DTaP/Tdap/Td vaccine (4 - Td or Tdap) 09/15/2029   Pneumococcal Vaccine for age over 56  Completed   Flu Shot  Completed   Hepatitis C Screening  Completed   Zoster (Shingles) Vaccine  Completed   Meningitis B Vaccine  Aged Out       08/29/2024   11:26 AM  Advanced Directives  Does Patient Have a Medical Advance Directive? Yes  Type of Advance Directive Living will  Does patient want to make changes to medical advance directive? No - Patient declined    Vision: Annual vision screenings are recommended for early detection of glaucoma, cataracts, and diabetic retinopathy. These exams can also reveal signs of chronic conditions such as diabetes and high blood pressure.  Dental: Annual dental screenings help detect early signs of oral cancer, gum disease, and other conditions linked to overall health, including heart disease  and diabetes.  Please see the attached documents for additional preventive care recommendations.

## 2024-08-29 NOTE — Progress Notes (Signed)
 Chief Complaint  Patient presents with   Medicare Wellness     Subjective:   Wanda Gonzales is a 68 y.o. female who presents for a Medicare Annual Wellness Visit.  Visit info / Clinical Intake: Medicare Wellness Visit Type:: Subsequent Annual Wellness Visit Persons participating in visit and providing information:: patient Medicare Wellness Visit Mode:: In-person (required for WTM) Interpreter Needed?: No Pre-visit prep was completed: yes AWV questionnaire completed by patient prior to visit?: no Living arrangements:: lives with spouse/significant other Patient's Overall Health Status Rating: very good Typical amount of pain: some Does pain affect daily life?: no Are you currently prescribed opioids?: no  Dietary Habits and Nutritional Risks How many meals a day?: 3 Eats fruit and vegetables daily?: yes Most meals are obtained by: eating out In the last 2 weeks, have you had any of the following?: (!) nausea, vomiting, diarrhea (Has IBS) Diabetic:: no  Functional Status Activities of Daily Living (to include ambulation/medication): Independent Ambulation: Independent Medication Administration: Independent Home Management (perform basic housework or laundry): Independent Manage your own finances?: yes Primary transportation is: driving Concerns about vision?: no *vision screening is required for WTM* Concerns about hearing?: (!) yes Uses hearing aids?: (!) yes Hear whispered voice?: yes  Fall Screening Falls in the past year?: 1 Number of falls in past year: 1 Was there an injury with Fall?: 0 Fall Risk Category Calculator: 2 Patient Fall Risk Level: Moderate Fall Risk  Fall Risk Patient at Risk for Falls Due to: History of fall(s) Fall risk Follow up: Falls evaluation completed  Home and Transportation Safety: All rugs have non-skid backing?: N/A, no rugs All stairs or steps have railings?: N/A, no stairs Grab bars in the bathtub or shower?: yes Have non-skid  surface in bathtub or shower?: yes Good home lighting?: yes Regular seat belt use?: yes Hospital stays in the last year:: (!) yes How many hospital stays:: 1 Reason: Knee surgery  Cognitive Assessment Difficulty concentrating, remembering, or making decisions? : no Will 6CIT or Mini Cog be Completed: yes What year is it?: 0 points What month is it?: 0 points Give patient an address phrase to remember (5 components): [address] [place], [place] [address] About what time is it?: 0 points Count backwards from 20 to 1: 0 points Say the months of the year in reverse: 0 points Repeat the address phrase from earlier: 0 points 6 CIT Score: 0 points  Advance Directives (For Healthcare) Does Patient Have a Medical Advance Directive?: Yes Does patient want to make changes to medical advance directive?: No - Patient declined Type of Advance Directive: Living will Copy of Healthcare Power of Attorney in Chart?: No - copy requested  Reviewed/Updated  Reviewed/Updated: Reviewed All (Medical, Surgical, Family, Medications, Allergies, Care Teams, Patient Goals)    Allergies (verified) Codeine, Doxycycline, Concerta  [methylphenidate ], and Sulfa  antibiotics   Current Medications (verified) Outpatient Encounter Medications as of 08/29/2024  Medication Sig   alendronate (FOSAMAX) 70 MG tablet Take 70 mg by mouth once a week.   [facility] Fiber Gummy Bears Children CHEW Chew by mouth.   doxepin  (SINEQUAN ) 10 MG/ML solution 0.3 ml PO at bedtime for sleep   escitalopram  (LEXAPRO ) 10 MG tablet TAKE 1 TABLET(10 MG) BY MOUTH DAILY   Multiple Vitamin (MULTIVITAMIN) tablet Take 1 tablet by mouth daily.   omeprazole (PRILOSEC) 40 MG capsule TAKE 1 CAPSULE(40 MG) BY MOUTH DAILY BEFORE BREAKFAST   rosuvastatin  (CRESTOR ) 10 MG tablet TAKE 1 TABLET(10 MG) BY MOUTH AT BEDTIME  VITAMIN D  PO Take 1 each by mouth daily.   Krill Oil 500 MG CAPS Take 500 mg by mouth daily. (Patient not taking: Reported on 08/29/2024)    [DISCONTINUED] benzonatate  (TESSALON ) 100 MG capsule Take 1 capsule (100 mg total) by mouth every 8 (eight) hours.   No facility-administered encounter medications on file as of 08/29/2024.    History: Past Medical History:  Diagnosis Date   Abnormal Pap smear, atypical squamous cells of undetermined sign (ASC-US ) 1998   treated with cryo, CIN I   ADD (attention deficit disorder) 02/23/2012   easily distracted.   Arthritis 02/23/2012   Rt. hip and lt. arm   Closed displaced fracture of fifth metatarsal bone of left foot 03/31/2017   Depression with anxiety    GERD (gastroesophageal reflux disease)    History of kidney stones    History of osteoporosis    PONV (postoperative nausea and vomiting) 02/23/2012   always has PONV   Post-nasal drainage 02/23/2012   frequent a problem,causes a cough and mucus buildup in throat   Urethral polyp 1998   Dr. Duncan   Varicose veins    Venous insufficiency    chronic-denies any problems   Past Surgical History:  Procedure Laterality Date   blood clot removed Left 01/2012   blood clot removed from left hand    BREAST BIOPSY Right 02/14/2012   Fibroadenoma   COLONOSCOPY  08/2003   repeat 5 yrs.   COLONOSCOPY W/ BIOPSIES  12/2008   1 polyp recheck 5 yrs   EXCISION/RELEASE BURSA HIP  02/29/2012   HAND SURGERY  02/23/2012   blood clot excised palm left hand 01-18-12   KNEE ARTHROSCOPY W/ MENISCAL REPAIR  10/2012   KNEE ARTHROSCOPY WITH EXCISION BAKER'S CYST Left 10/2012   meniscal repair   left knee replacement      NASAL SINUS SURGERY     ORIF METATARSAL FRACTURE Left 04/07/2017   Left 5th Metatarsal   scar tissue removed left knee      SHOULDER SURGERY Right 02/2015   Dr. 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   Procedure: Left Knee polyethylene revision;  Surgeon: Melodi Lerner, MD;   Location: [facility];  Service: Orthopedics;  Laterality: Left;   Family History  Problem Relation Age of Onset   Breast cancer Mother 22       breast   Hyperlipidemia Mother    Brain cancer Father 57       brain   Osteoporosis Maternal Grandmother    Drug abuse Brother    Social History   Occupational History   Occupation: works part-time (two days a week)  Tobacco Use   Smoking status: Never   Smokeless tobacco: Never  Vaping Use   Vaping status: Never Used  Substance and Sexual Activity   Alcohol use: Not Currently    Alcohol/week: 1.0 standard drink of alcohol    Types: 1 Glasses of wine per week    Comment: rare occ.   Drug use: No   Sexual activity: Not Currently    Birth control/protection: Post-menopausal   Tobacco Counseling Counseling given: Not Answered  SDOH Screenings   Food Insecurity: Low Risk (08/19/2024)   Received from [facility]  Housing: Low Risk (08/29/2024)  Transportation Needs: No Transportation Needs (08/29/2024)  Utilities: Not At Risk (08/29/2024)  Alcohol  Screen: Low Risk (04/24/2024)  Depression (PHQ2-9): Low Risk (08/29/2024)  Financial Resource Strain: Low Risk (04/24/2024)  Physical Activity: Insufficiently Active (08/29/2024)  Social Connections: Socially Integrated (08/29/2024)  Stress: No Stress Concern Present (08/29/2024)  Tobacco Use: Low Risk (08/29/2024)  Health Literacy: Adequate Health Literacy (08/29/2024)   See flowsheets for full screening details  Depression Screen PHQ 2 & 9 Depression Scale- Over the past 2 weeks, how often have you been bothered by any of the following problems? Little interest or pleasure in doing things: 0 Feeling down, depressed, or hopeless (PHQ Adolescent also includes...irritable): 0 PHQ-2 Total Score: 0 Trouble falling or staying asleep, or sleeping too much: 0 Feeling tired or having little energy: 2 Poor appetite or overeating (PHQ Adolescent also includes...weight loss): 0 Feeling bad about  yourself - or that you are a failure or have let yourself or your family down: 0 Trouble concentrating on things, such as reading the newspaper or watching television (PHQ Adolescent also includes...like school work): 0 Moving or speaking so slowly that other people could have noticed. Or the opposite - being so fidgety or restless that you have been moving around a lot more than usual: 0 Thoughts that you would be better off dead, or of hurting yourself in some way: 0 PHQ-9 Total Score: 2 If you checked off any problems, how difficult have these problems made it for you to do your work, take care of things at home, or get along with other people?: Not difficult at all  Depression Treatment Depression Interventions/Treatment : Currently on Treatment     Goals Addressed             This Visit's Progress    Patient Stated       Patient states she would like to exercise more.              Objective:    Today's Vitals   08/29/24 1112  BP: (!) 92/50  Pulse: 94  SpO2: 96%  Height: 5' 1 (1.549 m)   Body mass index is 30.41 kg/m.  Hearing/Vision screen No results found. Immunizations and Health Maintenance Health Maintenance  Topic Date Due   COVID-19 Vaccine (7 - 2025-26 season) 05/20/2024   Colonoscopy  12/25/2024   Medicare Annual Wellness (AWV)  08/29/2025   Bone Density Scan  08/31/2025   Mammogram  11/14/2025   DTaP/Tdap/Td (4 - Td or Tdap) 09/15/2029   Pneumococcal Vaccine: 50+ Years  Completed   Influenza Vaccine  Completed   Hepatitis C Screening  Completed   Zoster Vaccines- Shingrix  Completed   Meningococcal B Vaccine  Aged Out        Assessment/Plan:  This is a routine wellness examination for [facility]  Patient Care Team: Alvan Dorothyann BIRCH, MD as PCP - General Jertson, Kate Norris, MD as Consulting Physician (Obstetrics and Gynecology)  I have personally reviewed and noted the following in the patients chart:   Medical and social  history Use of alcohol, tobacco or illicit drugs  Current medications and supplements including opioid prescriptions. Functional ability and status Nutritional status Physical activity Advanced directives List of other physicians Hospitalizations, surgeries, and ER visits in previous 12 months Vitals Screenings to include cognitive, depression, and falls Referrals and appointments  No orders of the defined types were placed in this encounter.  In addition, I have reviewed and discussed with patient certain preventive protocols, quality metrics, and best practice recommendations. A written personalized care plan for preventive services as well as  general preventive health recommendations were provided to patient.   Bonny Jon Mayor, CMA   08/29/2024   Return in 1 year (on 08/29/2025).  After Visit Summary: (In Person-Declined) Patient declined AVS at this time.  Nurse Notes:   LANESSA SHILL is a 67 y.o. female patient of Metheney, Dorothyann BIRCH, MD who had a Medicare Annual Wellness Visit today via telephone. Cataleia is Working part time and lives with their spouse. She does not have any children. She reports that she is socially active and does interact with friends/family regularly. She is moderately physically active and enjoys baking, traveling and art.

## 2024-10-03 ENCOUNTER — Other Ambulatory Visit: Payer: Self-pay | Admitting: Endocrinology

## 2024-10-03 DIAGNOSIS — M8580 Other specified disorders of bone density and structure, unspecified site: Secondary | ICD-10-CM

## 2024-10-03 DIAGNOSIS — Z780 Asymptomatic menopausal state: Secondary | ICD-10-CM

## 2024-10-31 ENCOUNTER — Ambulatory Visit: Admitting: Family Medicine

## 2024-11-20 ENCOUNTER — Other Ambulatory Visit: Payer: Medicare (Managed Care)

## 2025-09-02 ENCOUNTER — Ambulatory Visit
